# Patient Record
Sex: Male | Born: 1960 | Race: Black or African American | Hispanic: No | Smoking: Light tobacco smoker
Health system: Southern US, Community
[De-identification: ages and names within clinical notes are randomized; demographics above are authoritative.]

## PROBLEM LIST (undated history)

## (undated) DIAGNOSIS — N183 Chronic kidney disease, stage 3 unspecified: Secondary | ICD-10-CM

## (undated) DIAGNOSIS — I10 Essential (primary) hypertension: Secondary | ICD-10-CM

## (undated) DIAGNOSIS — I480 Paroxysmal atrial fibrillation: Secondary | ICD-10-CM

## (undated) DIAGNOSIS — K219 Gastro-esophageal reflux disease without esophagitis: Secondary | ICD-10-CM

## (undated) DIAGNOSIS — M1990 Unspecified osteoarthritis, unspecified site: Secondary | ICD-10-CM

## (undated) DIAGNOSIS — I421 Obstructive hypertrophic cardiomyopathy: Secondary | ICD-10-CM

## (undated) DIAGNOSIS — M26629 Arthralgia of temporomandibular joint, unspecified side: Secondary | ICD-10-CM

## (undated) DIAGNOSIS — I2510 Atherosclerotic heart disease of native coronary artery without angina pectoris: Secondary | ICD-10-CM

## (undated) HISTORY — PX: APPENDECTOMY: SHX54

## (undated) HISTORY — DX: Chronic kidney disease, stage 3 (moderate): N18.3

## (undated) HISTORY — DX: Essential (primary) hypertension: I10

---
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
## 2000-01-07 HISTORY — PX: TOTAL HIP ARTHROPLASTY: SHX124

## 2000-03-30 ENCOUNTER — Inpatient Hospital Stay (HOSPITAL_COMMUNITY): Admission: RE | Admit: 2000-03-30 | Discharge: 2000-04-08 | Payer: Self-pay | Admitting: Orthopaedic Surgery

## 2000-04-04 ENCOUNTER — Encounter (HOSPITAL_BASED_OUTPATIENT_CLINIC_OR_DEPARTMENT_OTHER): Payer: Self-pay | Admitting: Internal Medicine

## 2002-04-30 ENCOUNTER — Ambulatory Visit (HOSPITAL_COMMUNITY): Admission: RE | Admit: 2002-04-30 | Discharge: 2002-04-30 | Payer: Self-pay | Admitting: Orthopaedic Surgery

## 2002-11-05 ENCOUNTER — Ambulatory Visit: Admission: RE | Admit: 2002-11-05 | Discharge: 2002-11-05 | Payer: Self-pay | Admitting: Orthopaedic Surgery

## 2003-01-25 ENCOUNTER — Encounter: Payer: Self-pay | Admitting: Orthopedic Surgery

## 2003-01-29 ENCOUNTER — Encounter: Payer: Self-pay | Admitting: Orthopedic Surgery

## 2003-01-29 ENCOUNTER — Encounter (INDEPENDENT_AMBULATORY_CARE_PROVIDER_SITE_OTHER): Payer: Self-pay | Admitting: Specialist

## 2003-01-29 ENCOUNTER — Inpatient Hospital Stay (HOSPITAL_COMMUNITY): Admission: RE | Admit: 2003-01-29 | Discharge: 2003-02-03 | Payer: Self-pay | Admitting: Orthopedic Surgery

## 2004-03-06 ENCOUNTER — Encounter: Admission: RE | Admit: 2004-03-06 | Discharge: 2004-06-04 | Payer: Self-pay | Admitting: Orthopedic Surgery

## 2004-03-26 ENCOUNTER — Emergency Department (HOSPITAL_COMMUNITY): Admission: EM | Admit: 2004-03-26 | Discharge: 2004-03-26 | Payer: Self-pay | Admitting: Family Medicine

## 2004-07-03 ENCOUNTER — Encounter: Admission: RE | Admit: 2004-07-03 | Discharge: 2004-07-03 | Payer: Self-pay | Admitting: Sports Medicine

## 2004-12-06 ENCOUNTER — Inpatient Hospital Stay (HOSPITAL_COMMUNITY): Admission: EM | Admit: 2004-12-06 | Discharge: 2004-12-07 | Payer: Self-pay | Admitting: Emergency Medicine

## 2004-12-06 ENCOUNTER — Ambulatory Visit: Payer: Self-pay | Admitting: Sports Medicine

## 2004-12-07 ENCOUNTER — Encounter (INDEPENDENT_AMBULATORY_CARE_PROVIDER_SITE_OTHER): Payer: Self-pay | Admitting: *Deleted

## 2005-01-18 ENCOUNTER — Encounter: Admission: RE | Admit: 2005-01-18 | Discharge: 2005-01-18 | Payer: Self-pay | Admitting: Sports Medicine

## 2005-01-18 ENCOUNTER — Ambulatory Visit: Payer: Self-pay | Admitting: Family Medicine

## 2005-01-22 ENCOUNTER — Ambulatory Visit: Payer: Self-pay | Admitting: Family Medicine

## 2005-01-25 ENCOUNTER — Ambulatory Visit: Payer: Self-pay | Admitting: Sports Medicine

## 2005-01-25 ENCOUNTER — Encounter: Admission: RE | Admit: 2005-01-25 | Discharge: 2005-01-25 | Payer: Self-pay | Admitting: Family Medicine

## 2005-02-22 ENCOUNTER — Ambulatory Visit: Payer: Self-pay | Admitting: Family Medicine

## 2005-02-24 ENCOUNTER — Ambulatory Visit: Payer: Self-pay | Admitting: Sports Medicine

## 2005-04-01 ENCOUNTER — Ambulatory Visit (HOSPITAL_COMMUNITY): Admission: RE | Admit: 2005-04-01 | Discharge: 2005-04-01 | Payer: Self-pay | Admitting: *Deleted

## 2005-04-01 ENCOUNTER — Ambulatory Visit: Payer: Self-pay | Admitting: Family Medicine

## 2005-04-09 ENCOUNTER — Ambulatory Visit: Payer: Self-pay | Admitting: Family Medicine

## 2005-04-21 ENCOUNTER — Encounter: Admission: RE | Admit: 2005-04-21 | Discharge: 2005-04-21 | Payer: Self-pay | Admitting: Orthopedic Surgery

## 2005-06-28 ENCOUNTER — Ambulatory Visit: Payer: Self-pay | Admitting: Sports Medicine

## 2005-07-28 ENCOUNTER — Ambulatory Visit: Payer: Self-pay | Admitting: Family Medicine

## 2005-10-22 ENCOUNTER — Emergency Department (HOSPITAL_COMMUNITY): Admission: AD | Admit: 2005-10-22 | Discharge: 2005-10-22 | Payer: Self-pay | Admitting: Family Medicine

## 2005-11-29 ENCOUNTER — Ambulatory Visit: Payer: Self-pay | Admitting: Sports Medicine

## 2005-12-16 ENCOUNTER — Ambulatory Visit: Payer: Self-pay | Admitting: Family Medicine

## 2005-12-30 ENCOUNTER — Ambulatory Visit: Payer: Self-pay | Admitting: Family Medicine

## 2006-01-17 ENCOUNTER — Ambulatory Visit (HOSPITAL_COMMUNITY): Admission: RE | Admit: 2006-01-17 | Discharge: 2006-01-17 | Payer: Self-pay | Admitting: Family Medicine

## 2006-01-17 ENCOUNTER — Ambulatory Visit: Payer: Self-pay | Admitting: Family Medicine

## 2007-01-05 DIAGNOSIS — N19 Unspecified kidney failure: Secondary | ICD-10-CM

## 2007-01-05 HISTORY — DX: Unspecified kidney failure: N19

## 2007-02-28 ENCOUNTER — Telehealth: Payer: Self-pay | Admitting: *Deleted

## 2007-03-01 ENCOUNTER — Ambulatory Visit: Payer: Self-pay | Admitting: Sports Medicine

## 2007-03-15 ENCOUNTER — Telehealth: Payer: Self-pay | Admitting: *Deleted

## 2007-03-16 ENCOUNTER — Telehealth: Payer: Self-pay | Admitting: *Deleted

## 2007-03-23 ENCOUNTER — Emergency Department (HOSPITAL_COMMUNITY): Admission: EM | Admit: 2007-03-23 | Discharge: 2007-03-23 | Payer: Self-pay | Admitting: Family Medicine

## 2007-03-31 ENCOUNTER — Ambulatory Visit: Payer: Self-pay | Admitting: Family Medicine

## 2007-03-31 ENCOUNTER — Encounter (INDEPENDENT_AMBULATORY_CARE_PROVIDER_SITE_OTHER): Payer: Self-pay | Admitting: *Deleted

## 2007-03-31 DIAGNOSIS — M25519 Pain in unspecified shoulder: Secondary | ICD-10-CM | POA: Insufficient documentation

## 2007-03-31 DIAGNOSIS — J309 Allergic rhinitis, unspecified: Secondary | ICD-10-CM | POA: Insufficient documentation

## 2007-03-31 DIAGNOSIS — F17200 Nicotine dependence, unspecified, uncomplicated: Secondary | ICD-10-CM

## 2007-03-31 LAB — CONVERTED CEMR LAB
AST: 20 units/L (ref 0–37)
Albumin: 4.6 g/dL (ref 3.5–5.2)
BUN: 22 mg/dL (ref 6–23)
Glucose, Bld: 108 mg/dL — ABNORMAL HIGH (ref 70–99)
MCV: 79.2 fL
Platelets: 273 10*3/uL
Potassium: 4 meq/L (ref 3.5–5.3)
RBC: 4.48 M/uL

## 2007-04-06 ENCOUNTER — Telehealth: Payer: Self-pay | Admitting: *Deleted

## 2007-05-02 ENCOUNTER — Telehealth: Payer: Self-pay | Admitting: *Deleted

## 2007-05-05 ENCOUNTER — Ambulatory Visit: Payer: Self-pay | Admitting: Family Medicine

## 2007-05-05 DIAGNOSIS — E785 Hyperlipidemia, unspecified: Secondary | ICD-10-CM

## 2007-05-17 ENCOUNTER — Encounter: Admission: RE | Admit: 2007-05-17 | Discharge: 2007-08-15 | Payer: Self-pay | Admitting: *Deleted

## 2007-05-23 ENCOUNTER — Encounter (INDEPENDENT_AMBULATORY_CARE_PROVIDER_SITE_OTHER): Payer: Self-pay | Admitting: *Deleted

## 2007-06-02 ENCOUNTER — Ambulatory Visit: Payer: Self-pay | Admitting: Family Medicine

## 2007-06-13 ENCOUNTER — Telehealth: Payer: Self-pay | Admitting: *Deleted

## 2007-06-22 ENCOUNTER — Telehealth (INDEPENDENT_AMBULATORY_CARE_PROVIDER_SITE_OTHER): Payer: Self-pay | Admitting: *Deleted

## 2007-06-29 ENCOUNTER — Encounter (INDEPENDENT_AMBULATORY_CARE_PROVIDER_SITE_OTHER): Payer: Self-pay | Admitting: *Deleted

## 2007-07-07 ENCOUNTER — Ambulatory Visit: Payer: Self-pay | Admitting: Family Medicine

## 2007-07-07 DIAGNOSIS — M25559 Pain in unspecified hip: Secondary | ICD-10-CM

## 2007-07-25 ENCOUNTER — Telehealth (INDEPENDENT_AMBULATORY_CARE_PROVIDER_SITE_OTHER): Payer: Self-pay | Admitting: *Deleted

## 2007-08-24 ENCOUNTER — Telehealth (INDEPENDENT_AMBULATORY_CARE_PROVIDER_SITE_OTHER): Payer: Self-pay | Admitting: *Deleted

## 2007-09-04 ENCOUNTER — Ambulatory Visit: Payer: Self-pay | Admitting: Sports Medicine

## 2007-09-18 ENCOUNTER — Ambulatory Visit: Payer: Self-pay | Admitting: Sports Medicine

## 2007-09-25 ENCOUNTER — Encounter (INDEPENDENT_AMBULATORY_CARE_PROVIDER_SITE_OTHER): Payer: Self-pay | Admitting: *Deleted

## 2007-09-26 ENCOUNTER — Encounter: Admission: RE | Admit: 2007-09-26 | Discharge: 2007-09-26 | Payer: Self-pay | Admitting: Sports Medicine

## 2007-10-09 ENCOUNTER — Telehealth (INDEPENDENT_AMBULATORY_CARE_PROVIDER_SITE_OTHER): Payer: Self-pay | Admitting: *Deleted

## 2007-10-11 ENCOUNTER — Ambulatory Visit: Payer: Self-pay | Admitting: Family Medicine

## 2007-10-11 DIAGNOSIS — M549 Dorsalgia, unspecified: Secondary | ICD-10-CM | POA: Insufficient documentation

## 2007-10-11 HISTORY — DX: Dorsalgia, unspecified: M54.9

## 2007-10-19 ENCOUNTER — Telehealth: Payer: Self-pay | Admitting: *Deleted

## 2007-10-19 ENCOUNTER — Ambulatory Visit: Payer: Self-pay | Admitting: Family Medicine

## 2007-10-30 ENCOUNTER — Telehealth (INDEPENDENT_AMBULATORY_CARE_PROVIDER_SITE_OTHER): Payer: Self-pay | Admitting: *Deleted

## 2007-11-20 ENCOUNTER — Ambulatory Visit: Payer: Self-pay | Admitting: Sports Medicine

## 2007-11-22 ENCOUNTER — Encounter (INDEPENDENT_AMBULATORY_CARE_PROVIDER_SITE_OTHER): Payer: Self-pay | Admitting: *Deleted

## 2007-11-22 ENCOUNTER — Ambulatory Visit: Payer: Self-pay | Admitting: Family Medicine

## 2007-11-22 LAB — CONVERTED CEMR LAB
ALT: 15 units/L (ref 0–53)
Albumin: 4.6 g/dL (ref 3.5–5.2)
Alkaline Phosphatase: 66 units/L (ref 39–117)
BUN: 15 mg/dL (ref 6–23)
Calcium: 9.5 mg/dL (ref 8.4–10.5)
Chloride: 104 meq/L (ref 96–112)
Glucose, Bld: 82 mg/dL (ref 70–99)
Potassium: 4.2 meq/L (ref 3.5–5.3)
Sodium: 141 meq/L (ref 135–145)
Total Protein: 7.3 g/dL (ref 6.0–8.3)

## 2007-11-27 ENCOUNTER — Encounter (INDEPENDENT_AMBULATORY_CARE_PROVIDER_SITE_OTHER): Payer: Self-pay | Admitting: *Deleted

## 2007-12-28 ENCOUNTER — Telehealth (INDEPENDENT_AMBULATORY_CARE_PROVIDER_SITE_OTHER): Payer: Self-pay | Admitting: *Deleted

## 2008-01-16 ENCOUNTER — Telehealth: Payer: Self-pay | Admitting: *Deleted

## 2008-01-18 ENCOUNTER — Ambulatory Visit: Payer: Self-pay | Admitting: Sports Medicine

## 2008-01-19 ENCOUNTER — Ambulatory Visit: Payer: Self-pay | Admitting: Family Medicine

## 2008-01-24 ENCOUNTER — Telehealth (INDEPENDENT_AMBULATORY_CARE_PROVIDER_SITE_OTHER): Payer: Self-pay | Admitting: *Deleted

## 2008-01-31 ENCOUNTER — Ambulatory Visit: Payer: Self-pay | Admitting: Family Medicine

## 2008-02-07 ENCOUNTER — Telehealth (INDEPENDENT_AMBULATORY_CARE_PROVIDER_SITE_OTHER): Payer: Self-pay | Admitting: *Deleted

## 2008-02-17 ENCOUNTER — Ambulatory Visit: Payer: Self-pay | Admitting: Internal Medicine

## 2008-02-17 ENCOUNTER — Observation Stay (HOSPITAL_COMMUNITY): Admission: EM | Admit: 2008-02-17 | Discharge: 2008-02-18 | Payer: Self-pay | Admitting: Emergency Medicine

## 2008-02-17 ENCOUNTER — Encounter: Payer: Self-pay | Admitting: Family Medicine

## 2008-02-17 DIAGNOSIS — I480 Paroxysmal atrial fibrillation: Secondary | ICD-10-CM

## 2008-02-21 ENCOUNTER — Ambulatory Visit: Payer: Self-pay | Admitting: Family Medicine

## 2008-02-21 ENCOUNTER — Ambulatory Visit (HOSPITAL_COMMUNITY): Admission: RE | Admit: 2008-02-21 | Discharge: 2008-02-21 | Payer: Self-pay | Admitting: Family Medicine

## 2008-02-21 ENCOUNTER — Encounter (INDEPENDENT_AMBULATORY_CARE_PROVIDER_SITE_OTHER): Payer: Self-pay | Admitting: *Deleted

## 2008-02-21 DIAGNOSIS — K219 Gastro-esophageal reflux disease without esophagitis: Secondary | ICD-10-CM | POA: Insufficient documentation

## 2008-02-21 LAB — CONVERTED CEMR LAB
ALT: 14 units/L (ref 0–53)
Free T4: 1.48 ng/dL (ref 0.89–1.80)
Glucose, Bld: 89 mg/dL (ref 70–99)
Sodium: 142 meq/L (ref 135–145)
TSH: 1.126 microintl units/mL (ref 0.350–5.50)
Total Bilirubin: 1.1 mg/dL (ref 0.3–1.2)
Total CHOL/HDL Ratio: 3.9
Total Protein: 6.9 g/dL (ref 6.0–8.3)

## 2008-02-26 ENCOUNTER — Telehealth: Payer: Self-pay | Admitting: *Deleted

## 2008-02-26 ENCOUNTER — Encounter (INDEPENDENT_AMBULATORY_CARE_PROVIDER_SITE_OTHER): Payer: Self-pay | Admitting: *Deleted

## 2008-03-07 ENCOUNTER — Encounter: Payer: Self-pay | Admitting: Family Medicine

## 2008-03-19 ENCOUNTER — Telehealth: Payer: Self-pay | Admitting: *Deleted

## 2008-04-12 ENCOUNTER — Telehealth (INDEPENDENT_AMBULATORY_CARE_PROVIDER_SITE_OTHER): Payer: Self-pay | Admitting: *Deleted

## 2008-04-23 ENCOUNTER — Ambulatory Visit: Payer: Self-pay | Admitting: Family Medicine

## 2008-04-23 ENCOUNTER — Encounter (INDEPENDENT_AMBULATORY_CARE_PROVIDER_SITE_OTHER): Payer: Self-pay | Admitting: *Deleted

## 2008-04-23 DIAGNOSIS — F528 Other sexual dysfunction not due to a substance or known physiological condition: Secondary | ICD-10-CM

## 2008-04-23 DIAGNOSIS — B351 Tinea unguium: Secondary | ICD-10-CM

## 2008-04-24 ENCOUNTER — Encounter (INDEPENDENT_AMBULATORY_CARE_PROVIDER_SITE_OTHER): Payer: Self-pay | Admitting: *Deleted

## 2008-05-17 ENCOUNTER — Telehealth (INDEPENDENT_AMBULATORY_CARE_PROVIDER_SITE_OTHER): Payer: Self-pay | Admitting: Family Medicine

## 2008-05-22 ENCOUNTER — Ambulatory Visit: Payer: Self-pay | Admitting: Family Medicine

## 2008-05-28 ENCOUNTER — Telehealth: Payer: Self-pay | Admitting: *Deleted

## 2008-06-11 ENCOUNTER — Telehealth (INDEPENDENT_AMBULATORY_CARE_PROVIDER_SITE_OTHER): Payer: Self-pay | Admitting: Family Medicine

## 2008-06-24 ENCOUNTER — Telehealth: Payer: Self-pay | Admitting: *Deleted

## 2008-07-03 ENCOUNTER — Telehealth: Payer: Self-pay | Admitting: *Deleted

## 2008-07-08 ENCOUNTER — Encounter (INDEPENDENT_AMBULATORY_CARE_PROVIDER_SITE_OTHER): Payer: Self-pay | Admitting: Family Medicine

## 2008-07-08 ENCOUNTER — Ambulatory Visit: Payer: Self-pay | Admitting: Family Medicine

## 2008-07-08 LAB — CONVERTED CEMR LAB

## 2008-07-09 LAB — CONVERTED CEMR LAB
BUN: 19 mg/dL (ref 6–23)
CO2: 22 meq/L (ref 19–32)
Chloride: 104 meq/L (ref 96–112)
Creatinine, Ser: 1.68 mg/dL — ABNORMAL HIGH (ref 0.40–1.50)
Potassium: 4.3 meq/L (ref 3.5–5.3)

## 2008-07-10 ENCOUNTER — Telehealth: Payer: Self-pay | Admitting: *Deleted

## 2008-07-11 ENCOUNTER — Ambulatory Visit (HOSPITAL_COMMUNITY): Admission: RE | Admit: 2008-07-11 | Discharge: 2008-07-11 | Payer: Self-pay | Admitting: Family Medicine

## 2008-07-11 ENCOUNTER — Encounter (INDEPENDENT_AMBULATORY_CARE_PROVIDER_SITE_OTHER): Payer: Self-pay | Admitting: Internal Medicine

## 2008-07-18 ENCOUNTER — Encounter: Payer: Self-pay | Admitting: *Deleted

## 2008-07-24 ENCOUNTER — Encounter: Payer: Self-pay | Admitting: *Deleted

## 2008-08-07 ENCOUNTER — Ambulatory Visit: Payer: Self-pay | Admitting: Family Medicine

## 2008-08-07 DIAGNOSIS — I421 Obstructive hypertrophic cardiomyopathy: Secondary | ICD-10-CM | POA: Insufficient documentation

## 2008-08-07 HISTORY — DX: Obstructive hypertrophic cardiomyopathy: I42.1

## 2008-09-05 ENCOUNTER — Telehealth: Payer: Self-pay | Admitting: *Deleted

## 2008-10-01 ENCOUNTER — Telehealth: Payer: Self-pay | Admitting: *Deleted

## 2008-11-04 ENCOUNTER — Telehealth (INDEPENDENT_AMBULATORY_CARE_PROVIDER_SITE_OTHER): Payer: Self-pay | Admitting: Family Medicine

## 2008-11-05 ENCOUNTER — Ambulatory Visit: Payer: Self-pay | Admitting: Family Medicine

## 2008-11-12 ENCOUNTER — Ambulatory Visit: Payer: Self-pay | Admitting: Sports Medicine

## 2008-11-12 DIAGNOSIS — M719 Bursopathy, unspecified: Secondary | ICD-10-CM | POA: Insufficient documentation

## 2008-11-12 DIAGNOSIS — M67919 Unspecified disorder of synovium and tendon, unspecified shoulder: Secondary | ICD-10-CM | POA: Insufficient documentation

## 2008-12-02 ENCOUNTER — Telehealth: Payer: Self-pay | Admitting: *Deleted

## 2008-12-03 ENCOUNTER — Telehealth (INDEPENDENT_AMBULATORY_CARE_PROVIDER_SITE_OTHER): Payer: Self-pay | Admitting: Family Medicine

## 2008-12-03 DIAGNOSIS — F1910 Other psychoactive substance abuse, uncomplicated: Secondary | ICD-10-CM | POA: Insufficient documentation

## 2008-12-05 ENCOUNTER — Encounter: Payer: Self-pay | Admitting: *Deleted

## 2008-12-18 ENCOUNTER — Ambulatory Visit: Payer: Self-pay | Admitting: Family Medicine

## 2009-02-11 ENCOUNTER — Ambulatory Visit: Payer: Self-pay | Admitting: Sports Medicine

## 2009-05-26 ENCOUNTER — Telehealth: Payer: Self-pay | Admitting: Family Medicine

## 2009-05-27 ENCOUNTER — Ambulatory Visit: Payer: Self-pay | Admitting: Family Medicine

## 2009-05-27 ENCOUNTER — Encounter: Payer: Self-pay | Admitting: Family Medicine

## 2009-06-24 ENCOUNTER — Telehealth: Payer: Self-pay | Admitting: *Deleted

## 2010-01-15 ENCOUNTER — Ambulatory Visit (HOSPITAL_COMMUNITY): Admission: RE | Admit: 2010-01-15 | Discharge: 2010-01-15 | Payer: Self-pay | Admitting: Family Medicine

## 2010-01-15 ENCOUNTER — Ambulatory Visit: Payer: Self-pay | Admitting: Sports Medicine

## 2010-01-15 ENCOUNTER — Encounter: Payer: Self-pay | Admitting: Family Medicine

## 2010-04-30 ENCOUNTER — Encounter: Payer: Self-pay | Admitting: Family Medicine

## 2010-04-30 ENCOUNTER — Ambulatory Visit: Payer: Self-pay | Admitting: Sports Medicine

## 2010-07-20 ENCOUNTER — Ambulatory Visit: Payer: Self-pay | Admitting: Sports Medicine

## 2010-07-20 DIAGNOSIS — M25569 Pain in unspecified knee: Secondary | ICD-10-CM

## 2010-08-11 ENCOUNTER — Ambulatory Visit: Payer: Self-pay | Admitting: Sports Medicine

## 2010-08-11 DIAGNOSIS — R269 Unspecified abnormalities of gait and mobility: Secondary | ICD-10-CM

## 2010-08-25 ENCOUNTER — Telehealth (INDEPENDENT_AMBULATORY_CARE_PROVIDER_SITE_OTHER): Payer: Self-pay | Admitting: *Deleted

## 2010-09-07 ENCOUNTER — Telehealth: Payer: Self-pay | Admitting: Family Medicine

## 2010-09-10 ENCOUNTER — Telehealth: Payer: Self-pay | Admitting: Family Medicine

## 2010-12-08 NOTE — Assessment & Plan Note (Signed)
Summary: HIP PAIN,MC   Vital Signs:  Patient profile:   50 year old male Height:      71 inches Weight:      205 pounds BP sitting:   149 / 87  Vitals Entered By: April Manson CMA (January 15, 2010 3:10 PM)  Primary Provider:  Clifton Custard MD   History of Present Illness: Pt presents for right shoulder pain, left shoulder weakness and bilateral hip pain. 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 He describes having had multiple surgeries on his hips which per his report have not been very successful. He is currently on permanent disability but is hoping to do some vocational rehabilitation. He is only taking Tylenol for his pain since he has a history of narcotic abuse and renal disase due to NSAID usage in the past. He is here because of worsening right thigh pain when walking and doing work on the stationary bike. He has not been able to return to his orthopedic surgeons (both in [place] and at [place]) because of unpaid medical bills   The patient also has a history of left rotator cuff tendinitis that was treated with steroid injections that were helpful. He now presents with right shoulder pain ever since he did some house work last Tuesday. He does not recall an actual injury or popping sensation in his shoulder. He does have decreased ROM and pain with sleeping on his right shoulder. He is right hand dominant.    He has also brought paperwork in for a handicapp sticker to put in his vehicle.   Allergies: 1)  ! Nsaids  Physical Exam  General:  alert, well-developed, and well-nourished.   Head:  normocephalic and atraumatic.   Neck:  supple.   Chest Wall:  no deformities.   Lungs:  normal respiratory effort.   Msk:  Right Shoulder: Normal inspection Forward flexion and abduction to 170 degress but slow and painful + drop arm 5/5 strength with resisted internal and  external rotation + Neer's, + Hawkin's, + cross over Normal Speed's test Mild TTP over the biceps tendon No AC joint pain to palpation 5/5 grip strength Able to put his hand up to his belt loop behind his back  Left Shoulder: Normal inspection Forward flexion and abduction to 170 degress but slow and painful + drop arm 5/5 strength with resisted internal and external rotation - Neer's, - Hawkin's, - cross over Normal Speed's test Mild TTP over the biceps tendon No TTP throughout 5/5 grip strength Able to put his hand up to L1  Bilateral Hips Walks with an antalgic gait and shuffles + TTP in bilateral groins and along greater trochanters Internal rotation of 10 degrees bilaterally External rotation of 20 degrees  4/5 strength with resisted bilateral hip flexion, abduction and adduction    Impression & Recommendations:  Problem # 1:  HIP PAIN, CHRONIC (ICD-719.45) Since he has had multiple surgeries, will get an x-ray of his bilateral hips with AP view and lateral view to assess the placement and stability of his prostheses Continue Tylenol as needed for pain. No NSAIDS because of renal disease. No narcotics because of history of abuse.  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 Consented patient for steroid injection into his right subacromial space for pain relief. Patient verbally consented and was aware of the  risks and benefits including infection, bleeding and tendon rupture.  Consent obtained and verified. Sterile betadine prep. Furthur cleansed with alcohol. Topical analgesic spray: Ethyl chloride. Joint: Right shoulder Approached in typical fashion with: subacromial approach Completed without difficulty Meds:40 mg of kenalog, 6 cc of 0.5 % Marcaine Needle: 27 guage Aftercare instructions and Red flags advised. Pt tolerated the procedure well  2. Given Codman exercises to do at first as well as Theraband and  rotator cuff exercises to do in 5 days. Do exercises with bilateral shoulders daily 3. Return in 4 weeks for follow-up  Complete Medication List: 1)  Quinapril Hcl 10 Mg Tabs (Quinapril hcl) .... One by mouth daily 2)  Zyrtec Allergy 10 Mg Tabs (Cetirizine hcl) .Marland Kitchen.. 1 once daily prn 3)  Flonase 50 Mcg/act Susp (Fluticasone propionate) .... Spray 1 spray into both nostrils twice a day 4)  Viagra 100 Mg Tabs (Sildenafil citrate) .... 1/2 to 1 tablet by mouth prn 5)  Ranitidine Hcl 300 Mg Tabs (Ranitidine hcl) .... Take one pill by mouth each morning

## 2010-12-08 NOTE — Progress Notes (Signed)
  Phone Note Refill Request Call back at [phone]   Refills Requested: Medication #1:  ZYRTEC ALLERGY 10 MG  TABS 1 once daily prn Pt need refill called in.  Initial call taken by: Eusebio Friendly,  September 07, 2010 3:27 PM    Prescriptions: ZYRTEC ALLERGY 10 MG  TABS (CETIRIZINE HCL) 1 once daily prn  #30 x 11   Entered and Authorized by:   Mariana Arn  MD   Signed by:   Mariana Arn  MD on 09/07/2010   Method used:   Electronically to        [facility] (retail)       [address]       [place], [place]  [address]       Ph: [phone]       Fax: [phone]   RxID:   [id number]

## 2010-12-08 NOTE — Assessment & Plan Note (Signed)
Summary: ORTHOTICS PER FIELDS,MC   Vital Signs:  Patient profile:   50 year old male Pulse rate:   68 / minute BP sitting:   151 / 84  (left arm)  Vitals Entered By: Caesar Chestnut RN (August 11, 2010 2:41 PM) CC: orthotics   Primary Provider:  Clifton Custard MD  CC:  orthotics.  History of Present Illness: 50 yo male to office for f/u of hip pain & possible orthotics.  He has extensive PMH significant for SCFE with Rt THR with multiple revisions (last surgery 2003).  Also with hx of pinning of Lt femoral head.  He has chronic pain in both hips, but has been worsening over the past year.  Symptoms currently most bothersome on the right.  Walks with a limp & has pain when walking.  Taking Mobic as needed with some improvement.  Hip injection in 2004 which helped.  Denies any numbness/tingling, denies any specific weakness.  Is not doing any hip exercises.  Allergies: 1)  ! 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  On disability for hx of SCFE.  Is substitute math teacher  Physical Exam  General:  Well-developed,well-nourished,in no acute distress; alert,appropriate and cooperative throughout examination Msk:   HIPS:  no TTP along greater troch or groin today.  globally restricted ROM bilaterally with significantly decreased IR bilaterally L>R.  Global hip weakness also noted bilaterally.  FEET: bilateral pes planus, no significant callous formation.    GAIT:  No leg length difference.  Ttrendelenburg gait with shift to the right.  Also noted to have external rotation of right hip & foot.  Moderate pronation bilaterally R>L Pulses:  +2/4 DP & PT b/l Neurologic:  sensation intact to light touch.     Impression & Recommendations:  Problem # 1:  HIP PAIN, CHRONIC (ICD-719.45)  - Chronic hip pain - R>L presently - Noticeable hip weakness & gait abnormality which is likely contributing to his hip pain.  Fitted with sports insoles in office today as noted below. - Given handout for hip exercises to help with ROM & strengthening.   - Encouraged to use stationary bike. - Can continue mobic as prescribed. - May need to re-establish care with orthopedist in the future given his extensive hx of surgeries. - f/u as previously recommended in 2-3 weeks.  His updated medication list for this problem includes:    Mobic 15 Mg Tabs (Meloxicam) .Marland Kitchen... Take 1 tab by mouth daily as needed pain.  Orders: Foot Insert (L3080) Sports Insoles [phone])  Problem # 2:  ABNORMALITY OF GAIT (ICD-781.2)  - Trendelenburg gait with shift to right, prominent ext rotation of right hip/leg, bilateral pronation - contributing to chronic hip pain -  Fitted with sports insoles with scaphoid pads & 1st ray post bilaterally.  Reassessment of gait with insoles showed some persistent trendelnburg, but with less right hip/leg ext rotation & less pronation of the feet.  Insoles felt comfortable to patient. - May benefit from custom orthotics in the future.  Orders: Foot Insert (J2426) Sports Insoles [phone])  Complete Medication List: 1)  Quinapril Hcl 10 Mg Tabs (Quinapril hcl) .... One by mouth daily 2)  Zyrtec Allergy 10 Mg Tabs  (Cetirizine hcl) .Marland Kitchen.. 1 once daily prn 3)  Flonase 50 Mcg/act Susp (Fluticasone propionate) .... Spray 1 spray into both nostrils twice a day 4)  Viagra 100 Mg Tabs (Sildenafil citrate) .... 1/2 to 1 tablet by mouth prn 5)  Ranitidine Hcl 300 Mg Tabs (Ranitidine hcl) .... Take one pill by mouth each morning 6)  Mobic 15 Mg Tabs (Meloxicam) .... Take 1 tab by mouth daily as needed pain.

## 2010-12-08 NOTE — Letter (Signed)
Summary: [place] [facility]  [place] [facility]   Imported By: Tobin Chad 01/19/2010 10:45:17  _____________________________________________________________________  External Attachment:    Type:   Image     Comment:   External Document

## 2010-12-08 NOTE — Assessment & Plan Note (Signed)
Summary: SHOULDER INJECTION,MC   Vital Signs:  Patient profile:   50 year old male BP sitting:   154 / 98  Vitals Entered By: April Manson CMA (April 30, 2010 2:13 PM)  Primary Provider:  Clifton Custard MD   History of Present Illness: Pt presents for follow-up of left shoulder pain and bilateral hip pain. He continues to have difficulty with pain along his left shoulder when he does yard work. He, per his report, had an MRI previously which showed a rotator cuff tear. However, he rehabbed his shoulder very well and is able to do overhead activities. His left shoulder still aches at night. The previous steroid injections have helped him with his left shoulder pain. He is here requesting another injection.  He also has a history of SCFE as a child and required a right total hip replacement with revision as well as pinning of his left hip. He has been in chronic pain but has been able to continue most of his activities. he has more right thigh pain though in the middle of his thigh. He has not seen an orthopedic surgeon for this in a while. He had his last surgery performed by Dr. Hector Shade per his report.   Allergies: 1)  ! 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 Maureen Ralphs hypertrophic cardiomyopathy with Afib  Past Surgical History: Last updated: 02/17/2008 Cystoscopy - wnl - 02/22/2005, ECHO - EF 55-65, WNL - 11/08/2004,  Injected R shoulder - 8/05, 2/07 - 12/16/2005,  S/P B hip replacements x 2 `01 and `05 - 01/18/2005 s/p pinning or R and L hip as child - 01/18/2005  Family History: Last updated: 08/23/2008 Brother died recently from complication of DM DM - dad, mom and brother father with prostate cancer no sudden cardiac death or CAD <ae 24   son (Khadir) w/ scfe dx'd age  20  Social History: Last updated: 12/18/2008 smokes 1/4  pack q day but has smoked heavier in past; 2-3 drinks dark liquor per day, denies drugs, but 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  On disability for hx of SCFE.  Is substitute math teacher  Risk Factors: Smoking Status: current (05/27/2009) Packs/Day: 0.5 (05/27/2009)  Physical Exam  General:  alert and well-developed.   Head:  normocephalic and atraumatic.   Ears:  Normal hearing Mouth:  MMM Neck:  supple and full ROM.   Lungs:  normal respiratory effort.   Msk:  Left Shoulder Normal inspection ROM has difficulty at 90 degrees with abduction and recruits deltoid and other muscles. Lacks 20 degrees actively with FF and abduction 4/5 strength with resisted IR and ER of his shoulder + Empty can test, Hawkin's, Cross over, Speed's and Neer's No TTP over [facility] joint Neurovascularly intact  Right Shoulder: Normal inspection Again has difficulty at 90 degrees with forward flexion and abduction and recruits other muscles. Lacks 30 degrees of active FF and abduction 4/5 strength with resisted IR and ER of his shoulder + Empty can test (mild),  Speed's and Neer's Neg Cross over and Hawkin's No TTP over [facility] joint Neurovascularly intact  Hips: Only has about 10-15 degrees of IR rotation on the left and ER of 15-20 degrees Right hip IR of 15-20 degrees  and ER of 20-25 degrees Walks with a limp   Impression & Recommendations:  Problem # 1:  ROTATOR CUFF SYNDROME, LEFT (ICD-726.10) Assessment Deteriorated  1. Discussed having him seen an orthopedic surgeon simply for evaluation but it has been over a year since he tore his cuff per his report - can consider this in the future but likely too late for surgery 2. Consented for an injection today but also informed him that these may cause more osteoarthritis in the future Consent obtained  and verified. Sterile betadine prep. Furthur cleansed with alcohol. Topical analgesic spray: Ethyl chloride. Joint: LEFT shoulder Approached in typical fashion with: Completed without difficulty Meds:40mg  of Kenalog and 7 cc of 1% Lidocaine Needle:22 gauge Aftercare instructions and Red flags advised. No heavy lifting for 3 days.  3. Can return in 3 months as needed for future injections  Orders: Joint Aspirate / Injection, Large (20610) Kenalog 10mg  (4units) (J3301)  Problem # 2:  HIP PAIN, CHRONIC (ICD-719.45) Assessment: Unchanged Showed him his x-rays 1. Encouraged him to call Dr. Maureen Ralphs to see if he can follow-up with him to check the stability of his prostheses  Complete Medication List: 1)  Quinapril Hcl 10 Mg Tabs (Quinapril hcl) .... One by mouth daily 2)  Zyrtec Allergy 10 Mg Tabs (Cetirizine hcl) .Marland Kitchen.. 1 once daily prn 3)  Flonase 50 Mcg/act Susp (Fluticasone propionate) .... Spray 1 spray into both nostrils twice a day 4)  Viagra 100 Mg Tabs (Sildenafil citrate) .... 1/2 to 1 tablet by mouth prn 5)  Ranitidine Hcl 300 Mg Tabs (Ranitidine hcl) .... Take one pill by mouth each morning

## 2010-12-08 NOTE — Letter (Signed)
Summary: [facility]  [facility]   Imported By: Tobin Chad 04/30/2010 15:42:41  _____________________________________________________________________  External Attachment:    Type:   Image     Comment:   External Document

## 2010-12-08 NOTE — Progress Notes (Signed)
----   Converted from flag ---- ---- 08/25/2010 2:09 PM, Paul [name] wrote: Pt requested a refill for meloxicam, he just had it refilled but states he spilled water on the bottle of his pills.  Call back # is [phone] ------------------------------  Spoke with patient and advised that he has additional refills already on his meloxicam, and he is requesting the refill at the pharmacy too soon which is why they are charging full price for the rx.  Per Dr. [name] advised pt that he can take 2 aleve twice daily and this would be simlar potency to the meloxicam.

## 2010-12-08 NOTE — Assessment & Plan Note (Signed)
Summary: INJECTION HIP,MC   Vital Signs:  Patient profile:   50 year old male Pulse rate:   63 / minute BP sitting:   161 / 86  (right arm)  Vitals Entered By: Hedy Camara (July 20, 2010 9:53 AM) CC: Left rotator tear, right knee pain & hip pain   Primary Provider:  Clifton Custard MD  CC:  Left rotator tear and right knee pain & hip pain.  History of Present Illness:  Paul [name] presents to discuss the following: - Left Shoulder Pain with h/o RTC tear. Last seen in June - had an injection which helped.  Pain worse with overhead motion.  Also feels some weakness.  Uses tylenol as needed with no sig improvement.  Does use advil occasaionally which is well tolerated and improves his pain.  - Right Hip Pain. s/p total hip replacement with revision (last 2003) for SCFE as a teenager.  Pain has been chronic and gradually worsening over the last year.  Has had steroid injections into hip  ~2004 which helped. Would like to discuss other treatment options. Denies parasthesias or new weakness of extremity.   - Chronic, intermittent, right Knee Pain that worsened over the last 2 weeks.  Pain primarily over patellar tendon but also occurs diffusely. Changed shoes and wonders if this contributed to the pain.  Worse with walking/activity.  Has used advil which helps. Denies injury, edema, clicking, poping, or catching.   Allergies: 1)  ! Nsaids  Physical Exam  General:  Well-developed,well-nourished,in no acute distress; alert,appropriate and cooperative throughout examination Msk:  Lt Shoulder - +arc and drop arm - +empty can, hawkins, speeds - 4/5 strength ext rotation with pain  Left Knee - mild TTP patellar tendon; equivocal pat grind; no effusion or laxity. McMurray deffered 2/2 him pain.  Left Hip/Leg - Restricted ROM globally with mild pain. - Left leg 98cm vs right 97cm  Gait: - Antalgic note with hip ext rotation and some weakness he also shows a significant trendelenburg  with shift to RT - Moderate pronation bilat with eversion of feet R>L   Impression & Recommendations:  Problem # 1:  ROTATOR CUFF SYNDROME, LEFT (ICD-726.10)  Post Subacromial Injection: discussed risk, benefits, and alternatives. Pt verbally consented, landmarks identified, area prepped with alcohol and cold spray, needled inserted and aspirated, and solution of kenalog and lidocaine injected into the subacromial space. There were no complications, min bleeding, and pt had noticeable pain improvement.  Will also do trial of meloxicam; of note nsaids listed as an allergy however pt denies previous intolerance and doing well with advil.  Follow up in 2 weeks for U/S eval of RTC.   Orders: Joint Aspirate / Injection, Large (20610) Kenalog 10 mg inj (U2725)   cleanse with alcohol Topical analgesic spray : Ethyl choride Joint Lt shoulder Approached in typical fashion with: post window toward coracoid Completed without difficulty Meds:kenalog 40 + 6 lidocaine Needle:25 G and 1.5 in Aftercare instructions and Red flags advised completed by Dr Doy Hutching under observation  Problem # 2:  HIP PAIN, CHRONIC (ICD-719.45) Pt may benefit from orthotics; will return in 2-4 weeks for further evaluation.  Also discussed possibility that he may need to reestablish with his surgeon in the future.  Problem # 3:  KNEE PAIN, RIGHT (ICD-719.46) Knee pain likely 2/2 to gait disturbance from hip/pelvis.  Plan for orthotics f/u per above.   Meloxicam as noted.     His updated medication list for this problem includes:  Mobic 15 Mg Tabs (Meloxicam) .Marland Kitchen... Take 1 tab by mouth daily as needed pain.  Complete Medication List: 1)  Quinapril Hcl 10 Mg Tabs (Quinapril hcl) .... One by mouth daily 2)  Zyrtec Allergy 10 Mg Tabs (Cetirizine hcl) .Marland Kitchen.. 1 once daily prn 3)  Flonase 50 Mcg/act Susp (Fluticasone propionate) .... Spray 1 spray into both nostrils twice a day 4)  Viagra 100 Mg Tabs (Sildenafil citrate)  .... 1/2 to 1 tablet by mouth prn 5)  Ranitidine Hcl 300 Mg Tabs (Ranitidine hcl) .... Take one pill by mouth each morning 6)  Mobic 15 Mg Tabs (Meloxicam) .... Take 1 tab by mouth daily as needed pain. Prescriptions: MOBIC 15 MG TABS (MELOXICAM) Take 1 TAB by mouth daily as needed pain.  #30 x 3   Entered and Authorized by:   Paul Libel MD   Signed by:   Paul Libel MD on 07/20/2010   Method used:   Electronically to        [id number] (retail)       [place], [place]  [address]       Ph: [phone]       Fax: [phone]   RxID:   [id number]

## 2010-12-08 NOTE — Progress Notes (Signed)
Summary: meds prob  Phone Note Refill Request Call back at Home Phone [phone] Message from:  Patient  Refills Requested: Medication #1:  ZYRTEC ALLERGY 10 MG  TABS 1 once daily prn pill is not covered under insurance plan but liquid is   Initial call taken by: Audie Clear,  September 10, 2010 2:13 PM    New/Updated Medications: ZYRTEC CHILDRENS ALLERGY 5 MG/5ML SYRP (CETIRIZINE HCL) 10 mg by mouth qday. Prescriptions: ZYRTEC CHILDRENS ALLERGY 5 MG/5ML SYRP (CETIRIZINE HCL) 10 mg by mouth qday.  #1 x 6   Entered and Authorized by:   Mariana Arn  MD   Signed by:   Mariana Arn  MD on 09/21/2010   Method used:   Electronically to        [facility] (retail)       [address]       [place], [place]  [address]       Ph: [phone]       Fax: [phone]   RxID:   [id number]  Please let know that script is at pharmacy  for pickup. Thanks! Tenna Child  MD  September 21, 2010 1:20 PM

## 2011-01-20 ENCOUNTER — Encounter: Payer: Self-pay | Admitting: *Deleted

## 2011-02-05 ENCOUNTER — Other Ambulatory Visit: Payer: Self-pay | Admitting: Sports Medicine

## 2011-03-23 NOTE — Discharge Summary (Signed)
NAMETYLEK, BONEY NO.:  [id number]   MEDICAL RECORD NO.:  [id number]          PATIENT TYPE:  OBV   LOCATION:  3707                         FACILITY:  [facility]   PHYSICIAN:  Kasandra Knudsen, M.D.   DATE OF BIRTH:  [birth year]   DATE OF ADMISSION:  02/17/2008  DATE OF DISCHARGE:  02/18/2008                               DISCHARGE SUMMARY   ADMITTING DIAGNOSES:  1. Atrial fibrillation with rapid ventricular response.  2. Hypertension.  3. Chest pain.  4. Hyperlipidemia.  5. Chronic renal insufficiency.  6. Shoulder and hip pain.  7. Allergic rhinitis.  8. Tobacco use.   DISCHARGE DIAGNOSES:  1. Lone atrial fibrillation.  2. Hyperlipidemia.  3. Gastroesophageal reflux disease.  4. Hypertension.  5. Chronic shoulder and hip pain.  6. Allergic rhinitis.  7. Tobacco use.   HISTORY OF PRESENT ILLNESS:  This is a 50 year old male who was admitted  after being transferred from [facility] for an episode of  atrial fibrillation with rapid ventricular response.  The patient over  the past couple of days had the sensation of an elephant sitting on his  chest.  He states this only happened after eating meals.  It never  happened at rest.  It did not happen with exertion.  There was no  nausea, vomiting, or diaphoresis.  He was not short of breath.  There  was no radiation to the pain.  The patient took a sister's Nexium, and  the pain later resolved, and he felt that it was heartburn type pain.  He did go to a [facility], noted his blood pressure was low, and then went to  urgent care to have himself checked out where an EKG was obtained that  showed AFib with RVR.  He was subsequently transferred to [facility].   HOSPITAL COURSE:  1. Atrial fibrillation with rapid ventricular response:  This can be      considered a lone atrial fibrillation.  The patient was admitted      for telemonitor and had no further episodes of atrial fibrillation.      He  remained in normal sinus rhythm throughout this time.  It is      believed that this was one episode which likely caused      hypoperfusion and could have attributed to some of his chest pain.      Because it did not reoccur, it is not paroxysmal, and therefore he      did not require an anticoagulation.  However, he does need a more      extensive workup to determine the etiology.  His cardiac enzymes      remained negative.  After discharge, it was noted his TSH was low,      and his primary care Jessy Cybulski will follow this up in the clinic.      We did not consult cardiology; however, the patient will need a 2D      echo in the outpatient setting, and a possible cardiology referral      in the future.  At time  of discharge, we started him on a beta-      blocker to help his hypertension, which also may help with further      episodes of atrial fibrillation if this should occur.  2. Chest pain:  Resolved.  Likely this with is a dual component of      hypoperfusion secondary to the atrial fibrillation with rapid      ventricular response as well as some GERD.  It was noted that      Nexium relieved the pain and therefore we wrote for Nexium 40 mg at      discharge.  The patient may also take Tums or Mylanta to help with      his pain as well.  His primary care Imani Sherrin can follow up to see      whether there is any further episodes or if the PPI has indeed      relieved his pain.  3. Hypertension:  On admission, we stopped the patient's enalapril as      he had an approximately 25-30% bump in his baseline creatinine.      This could have been a component of the hypoperfusion or it could      have been related to his ACE inhibitor.  At the time of discharge,      we continue to hold his enalapril, and he may discuss whether to      restart it with his primary care Jla Reynolds.  He says his doctor has      told him that he needs it for renal protection.  If it is started,      it may be started  at a lower dose.  Please note that there was not      a greater than 35% increase in his creatinine; therefore, the      medicine can be restarted if needed.  At discharge, we started      carvedilol 3.125 mg p.o. b.i.d. to help control his blood pressure.      This may be titrated up or discontinued at the primary care      Makeila Yamaguchi's discretion.  4. Hyperlipidemia:  The patient is not on a statin.  We will follow up      with primary care Henli Hey.  5. Chronic shoulder and hip pain:  Throughout his hospital stay, the      patient was continued on his home dose of tramadol as well as      Percocet.  There were no complications during his stay.  6. Allergic rhinitis and seasonal allergies:  The patient was      continued on home dose of Allegra during his hospital stay.  7. Tobacco use:  The patient stated he wanted a low dose nicotine      patch as he feels that he likes the way it works.  He may continue      in outpatient setting.  He has considered quitting smoking, and      this may be a good time for him to do so.  He is worried that the      patch will not be covered by his insurance company, and he will      check on this when he is discharged from the hospital.   DISCHARGE MEDICATIONS:  1. Nexium 40 mg p.o. daily.  2. Carvedilol 3.125 mg p.o. b.i.d.  3. Aspirin 81 mg p.o. daily.  4. Tramadol 50 mg 1 to 2  pills q.6 h. p.r.n. pain.  5. Percocet 5/325 mg 1 tablet p.o. q.6 h. p.r.n.  6. Allegra 180 mg 1 pill daily.  7. Flonase 50 mcg 1 spray both nostrils b.i.d.   PERTINENT IMAGES:  Chest x-ray showed no acute process.   LABORATORY DATA:  TSH 0.268 low, cardiac enzymes remained negative,  initial creatinine was 2, on the day of discharge 1.68, hemoglobin  mildly decreased at 12.8.  All other labs remained within normal limits.   FOLLOWUP:  The patient will follow up with Dr. Darylene Price at [facility] hopefully this week.  He will call on February 19, 2008, to  set  up an appointment for the week.   FOLLOWUP ISSUES FOR PRIMARY CARE Kevia Zaucha:  The patient was discontinued  off his enalapril, may need to resume.  The patient will need a 2D echo  to further assess his valve as well as if this is the cause for his  atrial fibrillation.  In addition, his TSH was low.  This came back  after the patient was discharged, and he will need further thyroid  workup as this could have also have been the etiology of his atrial  fibrillation.      Kasandra Knudsen, M.D.  Electronically Signed     JT/MEDQ  D:  02/18/2008  T:  02/19/2008  Job:  [id number]   cc:   Clifton Custard, M.D.

## 2011-03-26 NOTE — Op Note (Signed)
[place]. [facility]  Patient:    Paul [name], Paul [name]                    MRN: [id number] Proc. Date: 04/02/00 Adm. Date:  [id number] Attending:  Konrad Dolores                           Operative Report  PREOPERATIVE DIAGNOSIS:  Postoperative right hip dislocation, anterior.  POSTOPERATIVE DIAGNOSIS:  Postoperative right hip dislocation, anterior.  OPERATION PERFORMED:  Closed reduction of right hip under general anesthesia. Adductor tenotomy.  SURGEON:  Mark C. Lorin Mercy, M.D.  ASSISTANT:  Kevan Ny, PA-C.  ANESTHESIA:  General.  DESCRIPTION OF PROCEDURE:  After induction of general anesthesia with Sublimaze given, the hip was distracted directly in line as the patient had mu fasciculations.  90/90 traction was then applied.  A clunk was noted and the hip had good range of motion and rotation at that point.  As the hip was extended near full extension, another clunk was appreciated suggestive of subluxation.  Reduction was repeated.  The knee was gently brought out withi no apparent clunk.  An x-ray was taken which showed the hip was not reduced. 90/90 traction was again applied and the hip reduced.  Then with gently taking the leg out near full extension, the hip was subluxed anteriorly.  The patient had a large area of old scar debrided from his previous wound which had allowed to granulate in when he was a teenager due to postoperative infection and this tightness of the scar laterally may have contributed to the hip instability.  The hip was reduced, taken down to 30 degrees of flexion.  X-ray was taken to confirm that the hip was indeed reduced.  Adductor was tight anD after a Betadine prep, sterile gloves, using a sterile technique, a 15 scalpel blade was used for percutaneous adductor release and a single 4-0 nylon suture was placed in the skin.  This allowed for increased adduction and the patient was placed in an abduction pillow with a  pillow behind the knee, strapped into the adduction pillow and then transferred to the recovery room where a repeat x-ray showed the hip was in satisfactory position and reduced.  Cup flexion was appropriate.  The stem and neck were in good position and appropriate length and radiographs did not reveal any malposition that would suggest instability.  The patient was transferred to the recovery room in stable condition. DD:  04/02/00 TD:  04/06/00 Job: [id number]

## 2011-03-26 NOTE — H&P (Signed)
NAME:  Paul [name], BLANK NO.:  [id number]   MEDICAL RECORD NO.:  [id number]          PATIENT TYPE:  EMS   LOCATION:  MAJO                         FACILITY:  [facility]   PHYSICIAN:  Madeleine B. Vanstory, M.D.DATE OF BIRTH:  11-30-1960   DATE OF ADMISSION:  12/06/2004  DATE OF DISCHARGE:                                HISTORY & PHYSICAL   CHIEF COMPLAINT:  Near syncope.   HISTORY OF PRESENT ILLNESS:  This is a 50 year old African-American male who  was at church today, standing for a long time, got really dizzy and warm, so  patient sat down, continued to get hotter and hotter, and his vision got  down to tunnel vision.  The patient had not eaten that morning nor drunk  anything and had taken a Percocet secondary to chronic head pain. EMS came  to the church.  The patient felt better before they brought him to the ED,  but he was diaphoretic.  No chest pain, mild shortness of breath.   REVIEW OF SYSTEMS:  Entirely negative now.  Previously, mild shortness of  breath and diaphoresis which resolved.   PAST MEDICAL HISTORY:  None except for a slipped epiphysis and right hip  pinning at 50 years old and right total hip replacement arthroplasty in  2002.   MEDICATIONS:  Occasional Percocet and chronic ibuprofen use.   Other MD is Dr. Wynelle Link.   FAMILY HISTORY:  Mom died at 53 with hypertension and cancer, father with  prostate cancer and diabetes.  Father died at 16 with diabetes.   SOCIAL HISTORY:  Separated with one child, on disability x 2 years.  Smokes  one-half pack per day, denies ETOH.  Positive for illicit.   PHYSICAL EXAMINATION:  VITAL SIGNS: Temperature 97.5, heart rate 65, blood  pressure 106/73, respiratory rate 18, 96% on room air.  GENERAL:  The patient has sweat pants on under pants.  Is not diaphoretic.  No acute distress.  Alert and oriented x 3.  HEENT:  Positive for scleral icterus.  Pupils equal, round, and reactive to  light.  Extraocular  movements intact.  Auditory canals patent.  Ears patent.  No rhinorrhea.  Moist mucous membranes.  NECK:  No lymphadenopathy, no thyromegaly.  LUNGS:  Clear to auscultation.  HEART:  Regular rate and rhythm, 3/6 murmur blowing systolic.  No edema.  ABDOMEN:  No liver border palpated.  No pain with palpation of the abdomen.  SKIN:  No rashes.  NEUROLOGIC:  Cranial nerves II-XII intact.   LABORATORY DATA AND OTHER STUDIES:  EKG with T wave abnormality, unchanged  since March 2004.   Point-of-care cardiac enzymes negative x 2.  Electrolytes within normal  limits.  CBC:  White count 10.6, hemoglobin 11.7.  Total bilirubin and  indirect bilirubin mildly elevated at 1.4 and 1.3, respectively.  AST 19,  ALT 23.   ASSESSMENT AND PLAN:  A 50 year old African-American male who had a near  syncopal episode today.  1.  Near syncope:  The patient never lost consciousness, patient with      abnormal electrocardiogram but no change since March 2004  and no      arrhythmia.  This happened after standing for a long period of time.      Therefore, could be that patient had eaten or drunk, took a Percocet, so      could be orthostatic or dehydration.  The patient not orthostatic here      after eating and taking fluids.  Point-of-care enzymes negative x 2.  We      will admit for 23-hour observation in telemetry bed, give IV fluids, and      repeat cardiac enzymes x 3.  2.  Acute renal insufficiency with creatinine of 2.1.  Unsure of patient's      baseline.  The patient takes NSAIDs chronically secondary to head pain      in patient with no p.o. this morning.  Could be prerenal.  He is taking      p.o. now.  Will educate patient on NSAID use and will hydrate.  3.  Chronic right hip pain.  The patient is with slipped epiphysis as a      teenager and multiple surgeries on his hips with chronic bilateral hip      pain.  Will treat with Percocet.  The patient will need to follow up as      an  outpatient.      MBV/MEDQ  D:  12/06/2004  T:  12/06/2004  Job:  [id number]

## 2011-03-26 NOTE — Op Note (Signed)
[place]. [facility]  Patient:    JAXTIN, RAIMONDO                    MRN: [id number] Proc. Date: 03/30/00 Adm. 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  OPERATION PERFORMED:  Right total hip arthroplasty, cement femur, press-fit acetabulum.  SURGEON:  Mark C. Lorin Mercy, M.D.  ASSISTANT:  Yetta Barre. Marlou Sa, M.D.  SECOND ASSISTANT:  Kevan Ny, PA-C  ANESTHESIA:  General orotracheal.  ESTIMATED BLOOD LOSS:  1400 ml.  INDICATIONS FOR PROCEDURE:  COMPONENTS USED:  Secure-Fit HAPSL acetabular 56 mm shell, #6 cemented hip stem ODC, Omni-Fit 14 mm spacer and [employer] cement 10 degree liner and size 4 cement plug plus 0 neck length.  DESCRIPTION OF PROCEDURE:  After satisfactory general anesthesia, the patient was placed in lateral position and preoperative Ancef was given prophylactically.  The patient had an old 3 cm wide long incision from his old surgery where his wound had secondarily granulated in which left a depressed area stuck down to the fascia about 2 cm.  This area of the scar was excised. The skin was undermined.  The fascia was divided.  No sutures were encountered and the fascia was thick, hypertrophic, tense.   The fascia had to be elevated off the trochanter anteriorly and posteriorly.  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  No obvious  piriformis tendon was present.  A plane was developed and cobra retractors were placed laterally.  The sciatic nerve was palpated through the tense scar tissue but was not dissected free.  The lateral capsule was excised.  The inferior capsule was excised and due to the extreme tightness of the hip and rotation of only 5 to 10 degrees internal rotation and 10 degrees external rotation even with the acetabulum and head exposed, the neck was cut.  The head was out of shape and was adherent and could not be removed with the corkscrew and the head had to be quartered to be removed in pieces.  It showed severe destructive changes but there was only a small amount of serous hip joint fluid.  Cultures were obtained but there was evidence of infection present.  Once remaining pieces of the head was removed, a second neck cut was made one fingerbreadth above the lesser trochanter.  Due to the old plate, there was scar tissue and dense bone that filled in the femoral canal.  This prevented the canal starter reamer from being passed with the hand technique.  It was placed on power and initially with attempt to ream, the anterior cortex was breached a distance of 2 cm from the cut.  The medial calcar was intact as was the posterior cortex and the trochanter.  A compression hip screw device was then used to start the hole followed by the canal starter reamer and sequential reaming approaching.  Due to the defect and the  fact that the patient had a derotation osteotomy previously, standard press-fit on the femoral side was not an option due to the deformity of the proximal femur.  It was elected to place a cement restrictor, pulsatile irrigation and proceed with a cemented stem as the choice.  After irrigating, a sponge was placed down the canal, attention was turned to the acetabulum. Sequential reaming was performed up to good bleeding bone 56 mm size.  There was excellent fit using the ASIS to sciatic  notch for orientation.  For cup version the cup was placed at 45 degrees of abduction and 25 degrees of flexion.  Trials were used with the broach on the femoral side with the findings of excellent stability.  There was less than 1 mm shuck flexion to 80 degrees internal rotation, to 90 degrees with good stability with head adducted.  The hip would reach full external rotation.  The rectus was tight but the knee would still flex 90 degrees with the hip in neutral position and with it extended 20 degrees.  The anterior capsule was divided using a bone hook to hook the capsule and then divided with a long scalpel blade.  Permanent acetabulum was placed, central hole plugged, permanent acetabular shell inserted.  10 degree liner with the posterolateral 10 degree wall buildup.  Cement was vacuum mixed, packed with a glue gun, prosthesis held times 15 minutes with the collar flush with the calcar.  The calcar had been reamed prior to implantation.  The canal was meticulously dried after pulsatile irrigation and sequential sponging. The hip was reduced.  Stability was checked.  There was good stability in external rotation and full extension, good stability in 90 degrees flexion, internal rotation to 80 degrees.  Hemovac drain was placed followed by closure of the fascia which was extremely tight.  The iliotibial band was scarred down and multiple sutures had to be placed in order to get it closed over the top of the trochanter, subcutaneous tissue and then skin staples.  The patient tolerated the procedure well and was placed in a knee immobilizer and transferred to the recovery room.  Instrument and needle count was correct. D:  04/02/00 TD:  04/06/00 Job: [id number]

## 2011-03-26 NOTE — Discharge Summary (Signed)
NAME:  Paul [name], Paul [name]                       ACCOUNT NO.:  [id number]   MEDICAL RECORD NO.:  [id number]                   PATIENT TYPE:  INP   LOCATION:  7544                                 FACILITY:  [facility]   PHYSICIAN:  Gaynelle Arabian, M.D.                 DATE OF BIRTH:  28-Sep-1961   DATE OF ADMISSION:  01/29/2003  DATE OF DISCHARGE:  02/03/2003                                 DISCHARGE SUMMARY   ADMISSION DIAGNOSES:  1. Painful right total hip.  2. Status post primary right total hip replacement arthroplasty two years     ago.  3. Status post hip pinning (in the eighth grade).   DISCHARGE DIAGNOSES:  1. Loose right total hip arthroplasty, status post right total hip     replacement arthroplasty revision.  2. Status post primary right total hip replacement arthroplasty two years     ago.  3. Status post hip pinning (in the eighth grade).   PROCEDURES:  The patient was taken to the OR on January 28, 2003, and  underwent revision of a painful loose right total hip.  Surgeon:  Gaynelle Arabian, M.D.  Assistant:  Alexzandrew L. Dara Lords, P.A.  Surgery under  general anesthesia.  Estimated blood loss 1750 mL, replacement with three  units of packed red cells.  Hemovac drain x 1.   HISTORY OF PRESENT ILLNESS:  The patient is a 50 year old white male who had  a right total hip arthroplasty secondary to arthrosis of the right hip.  He  had subcapital femoral apophysis as a teenager.  He had a hip pinning and  initially did well.  Then approximately two years ago, he underwent a  primary right total hip for the hip arthrosis.  He initially did well,  however, recently was noted to have loosening of his femoral component.  He  was seen and evaluated by Gaynelle Arabian, M.D., for a second opinion and  presents now for possible revision versus resection arthroplasty depending  on intraoperative cultures.  His preoperative infection work-up was  negative.  The risks and benefits of the  procedure have been discussed with  the patient.  It was elected to proceed with surgery.   LABORATORY DATA:  The CBC on admission showed a hemoglobin of 11.4,  hematocrit 35.7, white cell count 8.4, and red cell count 4.64.  Postoperative H&H 8.9 and 27.1.  This continued to climb down to 7.9 and  23.4.  Given blood.  Post transfusion, hemoglobin back up to 9.1 and 27.3.  Last noted H&H 9.6 and 28.6.  The differential on the admission CBC was all  within normal limits.  The PT and PTT on admission was 13.6 and 33,  respectively.  The INR was 1.0.  The pro time was followed by Coumadin  protocol.  The last noted PT and INR were 18.2 and 1.6, respectively.  The  chemistry panel on admission was  all within normal limits with the exception  that the total bilirubin was minimally elevated at 1.5.  Serial BMETs were  followed.  The sodium dropped from 139 to 133 and back up to 140.  The  glucose went up from 98 to 127 and last noted at 134.  The calcium dropped  from 9.4 to 7.6 and back up to 8.1.  The urinalysis only showed trace blood  with a few epithelial cells, 0-2 white cells, and 0-2 red cells.  Blood  group and type O+.  Intraoperative cultures:  Gram's stain showed no  organism, tissue culture showed no wbc's and no organisms on Gram's stain  and no growth on the culture, anaerobe culture with no organisms seen and no  anaerobes isolated.   The EKG dated January 25, 2003, showed normal sinus rhythm, minimal voltage  criteria for LVH, nonspecific T-wave abnormalities, and ST abnormalities  slightly more marked than Mar 28, 2000, confirmed by [name] Argyle, M.D.  Postoperative films of pelvis and femur on January 29, 2003, showed no  hardware or bony abnormalities seen and status post revision right hip  replacement.   HOSPITAL COURSE:  The patient was admitted to [facility].  He was  taken to the OR and underwent the above-stated procedure without  complication.  The patient  tolerated the procedure well and was later  transferred to the recovery room and then to the orthopedic floor for  continued postoperative care.  Vital signs were followed.  The patient was  given 24 hours of postoperative IV antibiotics.  Placed on Coumadin for DVT  prophylaxis.  Given PCA analgesia for pain control following surgery  supplemented by p.o. medications.  On postoperative day #1, the patient did  have some itching.  The PCA was discontinued and switched over to another  medication.  He was given Atarax.  He also had a sore throat and was given  some Magic mouthwash and some hydrocortisone cream for an irritated area on  his neck.  Fluids were KVO.  PT and OT were consulted to assist with gait  training ambulation.  The patient progressed very well with period of time,  up ambulating approximately 200 feet by postoperative day #1 and 220 feet by  postoperative day #2.  He continued to do very well with his physical  therapy.  On day #2, the PCA was discontinued.  He did have a drop in his  hemoglobin again down 7.8.  He was given blood.  He was also noted to have  some mild hyponatremia.  Fluids were KVO and the IVs were discontinued after  the blood was transfused.  He responded well.  The hemoglobin came back up  to 9.1.  Dressing change initiated on postoperative day #2.  The incision  was healing well.  Electrolytes improved.  He was doing much better with his  pain control.  He continued to receive therapy and progressed well.  On  February 03, 2003, the patient was doing quite well.  He was independent with  his mobility, tolerating p.o. medications, and was discharged home.   DISPOSITION:  The patient was discharged home on February 03, 2003.   DISCHARGE MEDICATIONS:  1. Percocet for pain.  2. Robaxin for spasm.  3. Coumadin for DVT prophylaxis.   DIET:  As tolerated.   ACTIVITY:  Total hip protocol with touch down weightbearing of the right lower extremity.  Continue  with home health PT and home health nursing  through [facility].   FOLLOW-UP:  Follow up two weeks from surgery.  Call the office for an  appointment.   CONDITION ON DISCHARGE:  Improved.     Alexzandrew L. Dara Lords, P.A.              Gaynelle Arabian, M.D.    ALP/MEDQ  D:  02/13/2003  T:  02/14/2003  Job:  [id number]

## 2011-03-26 NOTE — H&P (Signed)
[place]  Patient:    Paul [name], Paul [name]                    MRN: [id number] Adm. Date:  [id number] Attending:  Konrad Dolores Dictator:   Denice Bors. Robeson, P.A.C.                         History and Physical  CHIEF COMPLAINT:  Right hip pain for several years.  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  Previous right hip osteotomy, hardware placed and wound was left to heal under secondary intention, secondary to infection and poor granulation; this was also a postoperative infection at the time, Dr. Evelena Peat R. Wilson, then later, he had a left subtrochanteric osteotomy performed by Dr. Richardson Landry.  Patient does state he has had a greater than five-year history of increased hip pain and weakness, especially with walking. Patient states Voltaren has slightly ameliorated the pain but over the years, Voltaren has lost its effectiveness.  ALLERGIES:  No known drug allergies and no known allergies.  MEDICATIONS: 1. Voltaren 75 mg b.i.d. 2. Tylenol Extra-Strength p.r.n.  FAMILY HISTORY:  Family history on his maternal side includes his mother having hypertension.  His brother was a diabetic but recently he did pass away.  SOCIAL HISTORY:  Positive for smoking x 15 years.  He previously smoked one to two packs a day but he says he is currently down to about 8 to 10 cigarettes per day right now.  Does drink alcohol on a daily basis and denies illicit drug use at this time.  REVIEW OF SYSTEMS:  GENERAL:  Denies any weight change, fatigue, weakness, fever, chills or night sweats.  SKIN:  Skin is intact.  Hair in normal distribution.  No recent nail changes, pruritus, rashes, sores, lumps or moles.  HEENT:  Head is atraumatic.  Denies any  trauma, concussions, nausea, vomiting, visual changes.  Eyes:  Denies wearing glasses or contacts, blurriness, tearing, itching, acute visual loss.  Ears:  No hearing loss.  No tinnitus.  No vertigo, discharge or earaches.  Nose and sinuses:  Denies rhinorrhea, stuffiness, sneezing, itching, allergies, epistaxis. Mouth/throat/neck:  Denies bleeding gums, hoarseness, sore throat, swollen neck.  BREASTS:  Within normal limits.  He denies any skin changes of the breasts, masses, lumps or discharge.  RESPIRATORY:  Denies shortness of breath, wheezing, cough, sputum, hemoptysis or any asthma, bronchitis, emphysema or tuberculosis.  CARDIAC:  Denies hypertension, murmurs, angina, palpitations, dyspnea on exertion, orthopnea, PND, edema.  GI/GU:  Denies any change in appetite, nausea, vomiting, diarrhea, indigestion, changes in bowel movements, hematochezia, constipation, bleeding, melena, abdominal pain, jaundice, hepatitis.  He denies frequency.  Denies sense of urgency, polyuria, polydipsia, hematuria or nocturia.  GENITAL:  Denies any sores, discharge, vesicles of external genitalia.  VASCULAR:  Denies any leg edema, claudication, varicose veins or emboli.  MUSCULOSKELETAL:  He denies muscle weakness.  Positive pain.  Positive joint stiffness to the right hip.  Admits to decreased range of motion and instability.  Denies redness.  Denies swelling of his hip.  Does admit to arthritis of the hips.  NEUROLOGIC: Denies loss of sensation, numbness, tingling, tremors, weakness, paralysis, fainting, blackouts, seizures.  HEMATOLOGICAL:  Denies  anemia, easy bruising, bleeding, petechiae, purpura, transfusions.  ENDOCRINE:  Denies heat or cold intolerances, excess sweating, polyuria, polydipsia, polyphagia, thyroid problems nor diabetes.  PSYCHIATRIC:  Denies mood, anxiety, depression.  PHYSICAL EXAMINATION:  GENERAL:  This is a 51 year old African-American male, alert and oriented x  3, well-developed, well-nourished, slightly obese, in no apparent distress.  VITAL SIGNS:  Temperature 98.3, pulse 80 and regular, respirations 20 and regular, blood pressure 142/84.  SKIN:  No skin rashes or infection over the site.  No tattoos or bruises. Hair consistency within normal limits.  HEENT:  Head normocephalic, atraumatic.  Eyes:  PERRLA.  EOMs intact.  Sclerae are white.  No papilledema.  Ears:  TMs pearly gray bilaterally without erythema or discharge.  Nose:  Septum is midline.  No tenderness.  No crepitation.  No erythema of nasal mucosa.  Mouth and throat:  No dentures. Teeth intact.  No caries.  No erythema.  NECK:  No lymphadenopathy, thyromegaly or carotid bruits.  BREASTS:  Within normal limits.  HEART:  Regular rate and rhythm, without murmurs, gallops or rubs.  S1 and S2. No clubbing, cyanosis, or edema.  Pulses are +2/4 bilaterally, upper extremities and lower extremities.  LUNGS:  Clear to auscultation bilaterally, without wheezing, rhonchi or rales.  ABDOMEN:  Soft.  Normoactive bowel sounds in four quadrants.  No masses, tenderness or organomegaly.  No CVA tenderness.  RECTAL:  Deferred.  GU:  Also deferred by patient.  MUSCULOSKELETAL:  No muscle atrophy.  A large surgical scar over his right hip going vertically to posterior, probably 12 inches long, vertical.  Poorly healed surgical scar from previous surgical instrumentation.  Some weakness. Tenderness with range of motion.  Range of motion is decreased to 3/5 on the right as compared to the left; tenderness with range of motion.  Deep tendon reflexes are 2/4 bilaterally in lower extremities and upper extremities. Decreased internal rotation to approximately 35 degrees and external rotation to approximately 35 degrees.  Gait is intact.  VASCULAR:  Pulses +2/4 bilaterally.  LYMPHATICS:  No lymphadenopathy.  NEUROLOGIC:  Cranial nerves II-XII are grossly intact.  X-RAY FINDINGS:  Positive  for right hip osteoarthritis with degenerative changes, bone-to-bone rubbing and head-of-the-femur flattening.  IMPRESSION:  Right hip osteoarthritis.   PLAN:  The plan is to proceed with a right total hip arthroplasty.  Patient and Dr. Elta Guadeloupe C. Lorin Mercy discussed the possibility of infection after this surgery, further arthritis, risks of bleeding, refracture, re-dislocation, the risks of prosthesis removal, reoperation or possibly revision or death. Patient had a chance to ask questions; all questions were asked and answered ______ .  Patient agrees to go ahead with surgery and requested to proceed. DD:  03/30/00 TD:  03/31/00 Job: [id number]

## 2011-03-26 NOTE — Op Note (Signed)
NAME:  Paul [name], Paul [name]                       ACCOUNT NO.:  [id number]   MEDICAL RECORD NO.:  [id number]                   PATIENT TYPE:  INP   LOCATION:  0099                                 FACILITY:  [facility]   PHYSICIAN:  Gaynelle Arabian, M.D.                 DATE OF BIRTH:  12-13-1960   DATE OF PROCEDURE:  01/29/2003  DATE OF DISCHARGE:                                 OPERATIVE REPORT   PREOPERATIVE DIAGNOSIS:  Loose right total hip arthroplasty.   POSTOPERATIVE DIAGNOSIS:  Loose right total hip arthroplasty.   PROCEDURE:  Right total hip arthroplasty revision.   SURGEON:  Gaynelle Arabian, M.D.   ASSISTANT:  Alexzandrew L. Dara Lords, P.A.   ANESTHESIA:  General.   ESTIMATED BLOOD LOSS:  1750 cc.   FLUIDS:  Replacement three units of packed red blood cells.   DRAIN:  Hemovac x1.   COMPLICATIONS:  None.   CONDITION:  Stable to recovery.   BRIEF CLINICAL NOTE:  The patient is a 50 year old male who had a right  total hip arthroplasty secondary to arthrosis of the right hip.  He had a  subcapsular femoral apophysis as a teenager.  The procedure was performed  approximately 2-1/2 years ago, and he initially did well.  Dr. Aline August saw him  recently and noted that he had a loose femoral component, and had planned on  revision.  The patient subsequently came for a second opinion and presents  now for possible revision versus resection arthroplasty, depending on  intraoperative cultures.  His preop infection workup is negative.   PROCEDURE IN DETAIL:  After the successful administration of general  anesthetic, the patient was placed in the left lateral decubitus position  with the right side up and held with a hip positioner.  Right lower  extremity was isolated from his perineum with plastic drapes.  He was  prepped and draped in the usual sterile fashion.   A standard posterolateral incision was made using a distal aspect of his old  incision, and proximally and posterolateral  direct posterior.  The skin was  closed with a #10 blade, through subcutaneous tissue and to the level of  fascia lata -- which was incised in line with the skin incision.  The  sciatic nerve was palpated and protected, and the posterior pseudocapsule  excised off the femur.  He had heterotopic bone present in his capsule, and  that was all excised.   The hip was extremely frozen down.  I had to do a lot of soft tissue release  in order to dislocate it.  Once dislocated I removed some overhanging  osteophyte and heterotopic bone over the stem.  I then removed the stem  easily with gentle retraction.  The cement is then removed piecemeal,  utilizing the [facility] cement extraction device.  I was then able to remove  all the cement all the way down to the plug, and  then remove the distal  plastic plug in the canal.  There was a small pedestal.  I drove through the  pedestal, placed the guide rod distally.  We sent several specimens for  frozen from the contents of the femoral canal, which was consistent with a  granulomatous __________  .  We also sent Gram-Stain CNS of that and fluid.  All of the Gram-Stains came back negative, and the frozen section was  consistent with chronic inflammation; with no evidence of any acute  inflammation.   At this point I decided to proceed with formal revision.  We then placed the  beaded guide rod distally into the canal, to go past the pedestal.  I reamed  the canal with flexible reamers up to 16.5 mm, to allow a 15 mm long bowed  stem.  I then prepared the femur proximally with a 53F proximal reamer and  machined the sleeve to a large.  Sludge is placed into the canal, and then  femur retracted anteriorly and acetabulum addressed.  The acetabular  position was in a significant amount of anteversion, beyond what I would  normally like.  Because of this I decided to remove the cup and place it in  more anatomic position.  I first removed the polyethylene  liner, then used  the [place] curved extraction equipment to disrupt the interface between  the bone and prosthesis.  Then the acetabular shell was easily removed, with  minimal if any bone loss.  The anterior and posterior columns remained  intact.  The size of the cup we removed was 56 mm, and I reamed up to 59 mm.  I wanted to fill the acetabulum more lateral, because it was too far medial  initially.  We placed 15 cc of the I.C. graft chamber bone graft, and then  reverse reamed with a 59 to create a more concentric acetabular bed.  I then  placed a 60 mm Pinnacle Multi-Hull acetabular shell in anatomic position,  and transfixed it with four domed screws with excellent purchase.  I then  placed a 36 mm neutral aligner.   I placed the trial 53F large sleeve with a trial 20 x 15 stem, with 36+ 8  neck and a 36+ zero head.  I first tried the calcar replacement stem, but it  added too much length and we could not reduce it.  Then with the 36+ 8 it  was easily reduced, and we had excellent stability with full extension,  flexion and rotation (70 degrees flexion, 40 degrees adduction and 90  degrees internal rotation, and 90 degrees flexion).  All of the trials were  removed and the permanent 36 mm neutral metal aligner was placed into the  acetabular shell for a metal-on-metal construct.  We then placed the  permanent 20-F large sleeve into the proximal floor, and then a 20 x 15 long  __________  stem with a 36+ 8 neck.  The version was just slightly beyond  neutral, because of his abnormal anatomy.  I did not want to antevert too  much for fear of anterior dislocation.  I just matched the trial and the  stem, and did end up filling in that position.  We then placed a permanent  36 to 0 head, reduced the hip;  we had the same stability parameters.  The  wound was then copiously irrigated with antibiotic solution.  X-rays taken AP and lateral to confirm that the stem distally is in the  canal.  The wound  is then closed over a Hemovac drain with interrupted #1 Vicryl in the  fascia, #1 and 2-0 Vicryl in the subcutaneous.  The skin was closed with  staples.  The incision is cleaned and dried and a bulky sterile dressing  applied.  Drains set to suction.  He was placed into a knee immobilizer,  awakened and then transported to recovery room in stable condition.                                                Gaynelle Arabian, M.D.    FA/MEDQ  D:  01/29/2003  T:  01/29/2003  Job:  [id number]

## 2011-03-26 NOTE — H&P (Signed)
NAME:  Paul [name], Paul [name]                       ACCOUNT NO.:  [id number]   MEDICAL RECORD NO.:  [id number]                   PATIENT TYPE:  INP   LOCATION:  [place]                                 FACILITY:  [facility]   PHYSICIAN:  Dione Plover. Aluisio, M.D.              DATE OF BIRTH:  January 05, 1961   DATE OF ADMISSION:  01/29/2003  DATE OF DISCHARGE:  02/03/2003                                HISTORY & PHYSICAL   CHIEF COMPLAINT:  Right hip pain.   HISTORY OF PRESENT ILLNESS:  The patient is a 50 year old male who has been  seen by Dr. Wynelle Link for ongoing right hip pain.  He had a complicated  history having undergone pinning for a slipped epiphysis as a teenager and  then a subsequent osteotomy with placement of a compression hip screw.  He  developed some arthritic changes and had previously undergone a total hip  replacement about a year and one-half ago.  He initially did well but over  the past six to eight months he has had increasing pain.  He has been seen  by Dr. Wynelle Link for ongoing pain.  He has undergone a workup and found to  have a loosening cemented stem which is possibly due to infection.  It is  known that he would need to have the right total hip revised.  Risks and  benefits of this procedure have been discussed with the patient at length  and he has elected to proceed with surgery.   ALLERGIES:  No known drug allergies.   CURRENT MEDICATIONS:  Ibuprofen stopped prior to surgery.   PAST MEDICAL HISTORY:  Negative.   PAST SURGICAL HISTORY:  1. Right hip pinning as a teenager.  2. Right total hip replacement 1-1/2 years ago.   SOCIAL HISTORY:  The patient is separated, has one child, about a 1/3 pack-  per-day smoker, seldom intake of alcohol.  He is a Hotel manager.   FAMILY HISTORY:  Mother deceased age 44 with a history of hypertension and  cancer.  Father living age 52 with a history of prostate cancer and  diabetes.  Brother deceased with diabetes at age 13.   REVIEW  OF SYSTEMS:  GENERAL:  No fevers, chill, night sweats.  NEUROLOGICAL:  No seizures, syncope, paralysis.  RESPIRATORY:  No shortness of breath,  productive cough, or hemoptysis.  CARDIOVASCULAR:  No chest pain, angina,  orthopnea.  GI:  No nausea, vomiting, diarrhea, constipation.  GU:  No  dysuria, hematuria, discharge.  MUSCULOSKELETAL:  Pertinent of that of the  right hip found in the History of Present Illness.   PHYSICAL EXAMINATION:  VITAL SIGNS:  Pulse 84, respirations 12, blood  pressure 148/84.  GENERAL:  The patient is a 50 year old African-American male, well nourished  and well developed.  In no acute distress.  HEENT:  Normocephalic, atraumatic.  Pupils round and reactive.  EOMs intact.  NECK:  Supple.  A faint  right bruit versus referred murmur from the thoracic  cavity.  CHEST:  Clear to auscultation anterior and posterior chest walls with a  faint crackle noted on the right mid pulmonary field.  This does clear with  coughing.  HEART:  Regular rate and rhythm with a crescendo-decrescendo 3/6 early  murmur heard at pulmonic Erb's and mitral point.  ABDOMEN:  Soft, flat, nontender.  Bowel sounds are present.  RECTAL, BREASTS, GENITALIA:  Not done and not pertinent to present illness.  EXTREMITIES:  Except as to the right lower extremity, the right hip range of  motion shows flexion of about 75 degrees.  There is no rotation internal or  external.  Only has about 20 degrees of abduction.  Motor function is  intact.   IMPRESSION:  Painful right total hip.   PLAN:  The patient will be admitted to [facility] and undergo  revision of the right total hip arthroplasty versus a resection arthroplasty  and placement of antibiotic spacer.  Surgery is to be performed by Dr. Gaynelle Arabian.     Alexzandrew L. Dara Lords, P.A.              Dione Plover Aluisio, M.D.    ALP/MEDQ  D:  03/07/2003  T:  03/07/2003  Job:  [id number]

## 2011-03-26 NOTE — Discharge Summary (Signed)
[place]  Patient:    Paul [name], Paul [name]                    MRN: [id number] Adm. Date:  03/30/00 Disc. Date: 04/08/00 Attending:  Gilmore Laroche Dictator:   Denice Bors. Robeson, P.A.-C.                           Discharge Summary  ADMISSION DIAGNOSIS:   Right hip osteoarthritis with degenerative disease.  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  PROCEDURES: 1. On Mar 30, 2000, Paul [name] underwent a right total hip arthroplasty,    cement femur, Press-Fit acetabulum.  Surgeon was [employer]. Lorin Mercy, M.D.,    Yetta Barre. Marlou Sa, M.D., assistant was Denice Bors. Robeson, P.A.-C.  Anesthesia    was general orotracheal.  Estimated blood loss was 1400 ml.  Components    used were Secur-Fit HAPSO acetabular 56 mm shell, #6 cemented hip stem ODC    Omnifit 14 mm spacer with [employer] cemented 10-degree linear and size 4    cement plug plus 0 neck length. 2. On Apr 02, 2000, with a preoperative diagnosis of postoperative right hip    dislocation anterior.  Operation performed was a closed reduction of the    right hip under general anesthesia, adductor tenotomy.  Surgeon was [employer].    Lorin Mercy, M.D., assistant Denice Bors. Robeson, P.A.-C.  Anesthesia was general.  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  However, at this time, the right hip is far worse than the left hip.  Pain is worse with ambulation.  He has undergone a previous right hip osteotomy; however, replaced pin due to a secondary to infection.  It was left to heal under secondary intention, which resolved with poor granulation tissue.  This was done by Dr. Eligha Bridegroom, and then later he had a left subtrochanteric osteotomy performed by Dr. ______ .  At this current time, the patient states he has had a greater than five-year  history of increasing hip pain and weakness, especially with walking.  The pain is relieved by rest and elevation of the lower extremity.  The patient relates the pain is a 7/10 on an average day.  The patient denied any other associated symptoms such as numbness or tingling of the leg.  He has failed multiple nonsteroidal anti-inflammatories including Voltaren. Voltaren was the last nonsteroidal anti-inflammatory he used, and it slightly ameliorated his pain.  ALLERGIES:  No known drug allergies.  MEDICATIONS: 1. Tylenol extra strength 500 mg 1 tablet q.4-6h. p.r.n. 2. Voltaren 75 mg p.o. b.i.d.  FAMILY HISTORY:  On the maternal side, he had hypertension.  His brother is a diabetic; however, he did recently pass away.  SOCIAL HISTORY:  Has been a cigarette smoker for 15 years, one pack per day. He previously smoked one to two packs, but he is currently down to 8-10 cigarettes.  Denies drinking alcohol on a daily basis or illicit drug use.  PHYSICAL EXAMINATION:  GENERAL:  This is a 50 year old African-American male, alert and oriented x 3. Well-developed, well-nourished, slightly obese.  In no apparent distress.  SKIN:  No active rashes or infections of the surgical site.  No tattoos or bruises.  He does have a large, 12-inch, surgical scar over  his right hip in a posterior vertical fashion.  This is not well-healed.  HEENT:  Normocephalic, atraumatic.  Eyes PERRLA.  EOMs intact.  Sclerae white. No papilledema.  Ears:  TMs are clear bilaterally, without erythema or discharge noted.  Nose:  Septum is midline, no tenderness or crepitus noted. No erythema in the nasal mucosa.  Mouth and throat:  No dentures.  Teeth are intact, no caries.  No erythema or exudates of the oropharynx.  NECK:  Supple.  No lymphadenopathy or thyromegaly, carotid bruits, or JVD.  HEART:  Regular rate and rhythm.  Without murmurs, gallops, or rubs.  S1, S2. No clubbing, cyanosis, or  edema.  EXTREMITIES:  Pulses of the lower extremity are +2/4.  LUNGS:  Clear to auscultation bilaterally, without wheezing, rhonchi, or rales.  ABDOMEN:  Soft.  Normoactive bowel sounds x 4 quadrants.  No masses or hepatosplenomegaly.  No CVA tenderness.  MUSCULOSKELETAL:  No muscle atrophy.  Weakness noted in the right hip with flexion and extension as compared to the left.  Tenderness with range of motion.  Deep tendon reflexes are intact, +2/4 bilaterally in the lower and upper extremities.  He has decreased internal rotation at approximately 35 degrees, and external rotation is also approximately 35 degrees.  His flexion is 0-90 degrees.  Gait is intact at this time.  Vascular:  Pulses +2/4 bilaterally.  No lymphadenopathy present.  NEUROLOGIC:  Cranial nerves II-XII grossly intact.  LABORATORY DATA:   X-rays at the time were positive for right hip osteoarthritis, degenerative changes, bone-on-bone rubbing, and head of femur flattening.  On Mar 30, 2000, white blood count of 12.5, ______ , hemoglobin 9.7, hematocrit 30.9, MCV 77, MCHC 31.5, platelets 187.  Prior to that on Mar 29, 1999, white blood count was 8.1, rbcs 5.0, hemoglobin 12.7, hematocrit 39.4, MCV 37.6, MCHC 32.3, and platelets 222.  Mar 28, 2000, he had an EFR of 9.  His chemistry panel on Mar 28, 2000, was sodium 141, potassium 4.1, chloride 105, carbon dioxide 31, glucose 87, BUN 11, creatinine 1.3, calcium of 9.6, TP 6.6, albumin 4.9, AST 18, ALT 17, ALP 58, and total bilirubin was 1.4.  Urinalysis was negative.  HOSPITAL COURSE:  On Mar 30, 2000, he underwent a right hip total hip arthroplasty performed by Dr. Rodell Perna.  Also on Mar 30, 2000, pharmacy protocol for Coumadin was implemented, Coumadin of 7.5 mg x 1.  A teaching booklet was also given, and daily PT/INRs and CBC was ordered.  Also on Mar 30, 2000, the patient was doing fairly well.  He only complained of slight pruritus secondary to the  morphine.  His vital signs that day were maximum temperature of 96.8, pulse of 89, respirations 20, BP 139/69, oxygen was 100%. His laboratories revealed a white blood count of 12.5, hemoglobin of 9.7,  hematocrit of 30.9, platelets of 187.  For the pruritus, he was ordered Benadryl.  We will monitor his H&H at that time.  His INR on Mar 31, 2000, which was postoperative day #2, was 1.5.  Also noted on Mar 31, 2000, his hemoglobin was 7.8 and hematocrit was 22.9.  One unit of packed rbcs was ordered.  On Apr 01, 2000, postoperative day #2, we discontinued his PCA morphine.  His hemoglobin was 8.1.  His sciatic nerve was intact.  H&H pending.  Culture and sensitivity negative so far for his urine.  PT was ordered for touchdown weightbearing.  Hemovac was also pulled.  On Apr 01, 2000,  PT/INR was 17.1 and 1.7.  He was continued on Coumadin 7.5.  OT evaluation was completed also on Apr 01, 2000.  On Apr 01, 2000, the PT noted the patients mobility was limited by decreased range of motion of the left hip, decreased flexion, and internal rotation, so a stat x-ray was ordered by Dr. Lorin Mercy on Apr 01, 2000. The hip x-ray revealed subluxation of the hip.  The x-ray revealed right hip prosthesis with superior subluxation of ___ components on the official radiology report.  It was discussed with the patient the need for closed reduction.  Wife was called, and discussed at bedside.  The plan was discussed with the patient, and he did agree to proceed with a return to the operating room for closed reduction of the right hip secondary to the subluxation.  On Apr 02, 2000, postoperative day #3, he underwent at, just past midnight, closed reduction of the right hip.  He was seen on Apr 02, 2000, postoperative day #3.  He was doing well.  He had good alignment on his follow-up x-ray from the closed reduction.  He had no bowel movement at this time.  Vital signs this day were 101.1, pulse of 90,  respirations 20, 138/80.  Hemoglobin was 8.1.  On Apr 02, 2000, his PT/INR was 18.1 and 9.1.  On Apr 03, 2000, postoperative day #4/#1, his hemoglobin was 8.0.  He had excellent pain control.  He has not had a bowel movement at this time.  We planned for fiberglass spica cast to be placed on Apr 04, 2000.  The patient was also discussed of the risks and ramifications of the spica cast in order to prevent dislocation.  The patients wife and himself also wished to talk to Dr. Eulas Post, who graciously agreed to see the patient.  On Apr 03, 2000, PT/INR was 18/1.9.  On Apr 03, 2000, orthopedic consultation was done by Dr. Eulas Post, who agreed to go ahead with the spica cast.  On Apr 04, 2000, postoperative day #5/#2, planned for a spica cast to be placed today.  He had no complaints this day.  Vital signs revealed maximum temperature 99.8, pulse 85, respirations 20, 130/86 blood pressure.  His hemoglobin was 8.3 and hematocrit 25.4.  On Apr 04, 2000, IV Toradol was given prior to giving hip spica cast. Postoperative cast x-rays revealed a good position.  The hip was reduced.  The plan was to have physical therapy ambulate the patient.  On Apr 05, 2000, postoperative day #6/#3, the patient was walking, up out of bed with spica cast on.  He also complained of the cast being too tight, and he has been also ______ , and says that the spica cast also compresses his chest with deep and long inspirations.  He had one bowel movement on this day.  Vital signs on this day were maximum temperature 98.6, 86 was the pulse, 24 was respirations, and BP was 128/71.  Hemoglobin was 8.5, hematocrit was 26.5.  The plan was to have the orthopedic tech trim and place a Webril stuffing around the trimmed cast.  On Apr 05, 2000, postoperative day #7/#4, the plan was for discharge to home when the patient felt ready.  On Apr 06, 2000, his PT/INR was 18.9 and INR was 2.1.  On Apr 07, 2000, postoperative day #8/#5, the  patient complained of being "worn out from physical therapy, walking with crutches is difficult." He has been walking, bearing 50% of his weight on his left  foot.  He questions for rehabilitation placement.  PT noted on Apr 07, 2000, that he was having difficulty gaining out of bed mobility, and he has been often complaining of daily increasing pain.  Consultation was done for physical medicine rehabilitation services, and they considered progress was very good at this time.  Will speak with case manager, risk management, etc., regarding a possible short stay versus home health physical therapy.  On April 08, 2000, postoperative day #9/#6, hemoglobin was 8.4.  The patient and his wife said he is not ready to go home.  She is working full-time, and he would be home alone.  This is a concern for him.  The patient would need someone to be home for assistance with him, and this was discussed with his wife.  Dr. Lorin Mercy did review on this day also his hip x-rays with his wife.  He discussed the daily treatment and the daily modalities of using the spica cast.  He also discussed the possibility of revision of the hip if he would have further instability after spica cast was removed.  At this time, INR was therapeutic.  Maximum temperature was 97.9.  He was also prophylactically placed on Keflex 500 mg b.i.d. for drainage noted from the drain from the incision site.  On ______ his PT was 19.8, and his INR was 2.3.  His hemoglobin was 8.3 and hematocrit of 26.5.  Also on this day, rehabilitation management spoke with the insurance carrier, [employer], and says insurance carrier thought the patient was going home yesterday.  Spoke with patient and wife and informed them that insurance would not cover this rehabilitation stay, and they did not want to finance this bill.  The recommended home physical therapy and follow-up.  Physical therapy also on this day said that if the patient  had accepted, will continue home physical therapy, and the decision was made to discharge the patient this day.  CONDITION ON DISCHARGE:  Good.  DISPOSITION:  Discharged to home, with home health physical therapy.  MEDICATIONS:  Vicodin #30, 1 tablet p.o. q.4-6h. severe pain, with daily PT/INRs.  FOLLOW-UP:  He will return to the office in one week for follow-up care at [facility] at [address] [place], [place]. Telephone number was given of [phone].   WOUND CARE:  Wound care instructions were given, proper dressing changes were also given to his family.  DIET:  He is not restricted for diet.  ACTIVITY:  Use crutches with the spica cast. DD:  04/28/00 TD:  04/30/00 Job: [id number]

## 2011-03-26 NOTE — Discharge Summary (Signed)
Paul [name], DEMEO NO.:  [id number]   MEDICAL RECORD NO.:  [id number]          PATIENT TYPE:  INP   LOCATION:  2017                         FACILITY:  [facility]   PHYSICIAN:  Helane Rima, MD     DATE OF BIRTH:  1961/05/19   DATE OF ADMISSION:  12/06/2004  DATE OF DISCHARGE:                                 DISCHARGE SUMMARY   DISCHARGE DIAGNOSES:  1.  Pre-syncope, rule out myocardial infarction.  2.  Chronic leg pain.  3.  Renal insufficiency.  4.  Anemia.   DISCHARGE MEDICATIONS:  1.  Percocet 5/325 mg one to two pills q.4 to 6h p.r.n.  2.  Ferrous sulfate 325 mg one p.o. daily.   SPECIAL INSTRUCTIONS:  1.  The patient is not to take any non-steroidal anti-inflammatories such as      Advil, ibuprofen, Motrin or Aleve.   FOLLOW UP:  1.  Follow-up with Dr. Judi Cong at [facility] family practice on      December 21, 2004 at 2:45 p.m.  2.  Outpatient follow-up with his orthopedic physician for his leg pain.  3.  The patient needs a complete annual physical examination at his follow-      up appointment with Dr. Judi Cong including a workup for anemia.   HOSPITAL COURSE:  This is a 50 year old African-American male who came in  for an episode of pre-syncope while at church and standing for prolonged  periods of time. He was worked up for the following while in the hospital.  1.  PRE-SYNCOPE.  The patient was admitted to rule out MI.  The patient      denied any chest pain with his episode of lightheadedness that was also      accompanied by sweating and a feeling of tunnel vision.  Once the      patient arrived at the hospital, all symptoms had resolved.  He was      worked up for a [place].  An EKG showed some T wave abnormalities with      elevations in leads V1 through V4 and some T wave inversions in leads V5      through V6 which were unchanged since March 2004.  His CK MB levels were      1.7 and 1.5 on the first two readings which are normal. He  additionally      had normal Troponin I levels and myoglobin.  Orthostatic blood pressures      were noncontributory.  The patient was admitted for a 23-hour      observation.  His cardiac enzymes were negative times three.  He was on      telemetry which showed no acute events overnight.  This episode was      likely thought secondary to dehydration.  However, the patient did have      a murmur which seemed to not be a new finding based on previous      dictations.  Additionally, he had no more episodes while in the      hospital.  He did undergo an echo during  his stay.  The results were not      available at the time of discharge.  As an outpatient, he would likely      benefit from cardiac stress testing.  2.  CHRONIC LEG PAIN.  The patient has a history of chronic leg pain      secondary to joint disease since he was a teenager for which he has been      chronically taking pain medications.  He is followed by an orthopedist      for this and no changes were made in the hospital as far as his leg goes      other than changing his method of analgesia to something other than non-      steroidal anti-inflammatories secondary to poor renal function as      discussed below.  At discharge, the patient was given a prescription for      Percocet 5/325 mg one to two pills q.4 to 6h p.r.n. with recommendations      to follow-up with his orthopedic physician.  3.  RENAL INSUFFICIENCY.  The patient on admission had a creatinine of 2.1      as well as some proteinuria which was thought initially to be secondary      to dehydration.  However, on the day after admission with some IV      rehydration the patient still had a creatinine of 1.9.  This is thought      to be secondary to chronic use of non-steroidal anti-inflammatories.      The patient was instructed to no longer use non-steroidal anti-      inflammatories as he had admitted to using approximately 1000 to 1200 mg      of ibuprofen at one  time, sometimes as many as three times a day.  The      patient was given Percocet in the interim as described above with      recommendations for follow-up of his renal disease with his primary care      physician, Dr. Judi Cong, at [facility] family practice.  4.  ANEMIA.  The patient was found to have a hemoglobin of 10.9 while in the      hospital.  Iron studies were obtained and were pending at the time of      discharge.  He had a low MCV of approximately 77.0.  He is discharged      with ferrous sulfate 325 mg one p.o. daily and will need follow-up as an      outpatient.      TH/MEDQ  D:  12/07/2004  T:  12/07/2004  Job:  [id number]   cc:   Manus Rudd, MD  Fax: [phone]

## 2011-05-13 ENCOUNTER — Other Ambulatory Visit: Payer: Self-pay | Admitting: Sports Medicine

## 2011-08-03 LAB — DIFFERENTIAL
Eosinophils Relative: 2
Lymphocytes Relative: 28
Neutro Abs: 5.5
Neutrophils Relative %: 64

## 2011-08-03 LAB — POCT I-STAT, CHEM 8
Calcium, Ion: 1.21
Chloride: 106
Glucose, Bld: 85
HCT: 43
Hemoglobin: 14.6
TCO2: 27

## 2011-08-03 LAB — BASIC METABOLIC PANEL
BUN: 20
Chloride: 105
Creatinine, Ser: 1.68 — ABNORMAL HIGH
GFR calc Af Amer: 53 — ABNORMAL LOW
GFR calc non Af Amer: 44 — ABNORMAL LOW
Potassium: 3.9

## 2011-08-03 LAB — CBC
MCV: 79.8
RDW: 13.2
WBC: 8.7

## 2011-08-03 LAB — POCT CARDIAC MARKERS
CKMB, poc: 2.7
Myoglobin, poc: 81.6
Operator id: 196461
Troponin i, poc: <0.05
Troponin i, poc: <0.05

## 2011-08-03 LAB — CARDIAC PANEL(CRET KIN+CKTOT+MB+TROPI)
Relative Index: 2.5
Total CK: 138
Troponin I: 0.02

## 2011-08-03 LAB — TSH: TSH: 0.268 — ABNORMAL LOW

## 2011-10-04 ENCOUNTER — Encounter: Payer: Self-pay | Admitting: Family Medicine

## 2011-10-04 ENCOUNTER — Telehealth: Payer: Self-pay | Admitting: Family Medicine

## 2011-10-04 ENCOUNTER — Ambulatory Visit (INDEPENDENT_AMBULATORY_CARE_PROVIDER_SITE_OTHER): Payer: Medicare Other | Admitting: Family Medicine

## 2011-10-04 DIAGNOSIS — I10 Essential (primary) hypertension: Secondary | ICD-10-CM

## 2011-10-04 DIAGNOSIS — J069 Acute upper respiratory infection, unspecified: Secondary | ICD-10-CM | POA: Insufficient documentation

## 2011-10-04 DIAGNOSIS — R05 Cough: Secondary | ICD-10-CM | POA: Insufficient documentation

## 2011-10-04 MED ORDER — BENZONATATE 100 MG PO CAPS: 100.0000 mg | ORAL_CAPSULE | Freq: Four times a day (QID) | ORAL | Status: DC | PRN

## 2011-10-04 MED ORDER — FLUTICASONE PROPIONATE 50 MCG/ACT NA SUSP: 1.0000 | Freq: Two times a day (BID) | NASAL | Status: DC

## 2011-10-04 MED ORDER — HYDROCODONE-HOMATROPINE 5-1.5 MG/5ML PO SYRP: 5.0000 mL | ORAL_SOLUTION | Freq: Four times a day (QID) | ORAL | Status: AC | PRN

## 2011-10-04 MED ORDER — MONTELUKAST SODIUM 10 MG PO TABS: 10.0000 mg | ORAL_TABLET | Freq: Every day | ORAL | Status: DC

## 2011-10-04 MED ORDER — CETIRIZINE HCL 5 MG PO TABS: 10.0000 mg | ORAL_TABLET | Freq: Every day | ORAL | Status: DC

## 2011-10-04 MED ORDER — AZITHROMYCIN 250 MG PO TABS: ORAL_TABLET | ORAL | Status: AC

## 2011-10-04 NOTE — Assessment & Plan Note (Signed)
Initially elevated, repeat much better. He is to make appt with PCP for FU.

## 2011-10-04 NOTE — Assessment & Plan Note (Signed)
Hycodan for relief.

## 2011-10-04 NOTE — Progress Notes (Signed)
  Subjective:    Patient ID: Paul [name], male    DOB: December 31, 1960, 49 y.o.   MRN: [id number]  HPI  1.  Cough and nasal congestion x 1 month:  Patient with PMH for allergic rhinitis, which has been present over the summer but worsening since season's change.  About 1 month ago noticed dramatic change in symptoms, much worsening nasal congestion and sinus pain.  Developed cough about the same time which has persisted to today.  No objective temperatures, does feel hot and then c/o chills.  Taking multitude of OTC decongestants and allergic meds, cannot remember name of any.  Cough productive of thick green sputum.  Continues to smoke.    Review of Systems See HPI above for review of systems.       Objective:   Physical Exam  Gen:  AAM in no acute distress, but does appear to be feeling bad. Head:  [place]/AT.  Eyes:  EOMI, PERRL, conjunctiva clear Nose:  Nasal turbinates swollen, erythematous, exudates noted.  Maxillary sinus tenderness to palpation Mouth:  MMM, tonsils not enlarged Neck:  No LAD Pulm:  Clear to auscultation bilaterally with good air movement.  No wheezes or rales noted.   Cardiac:  Regular rate and rhythm without murmur auscultated.  Good S1/S2.       Assessment & Plan:

## 2011-10-04 NOTE — Patient Instructions (Signed)
I have sent in an antibiotic for you today - take 2 today and 1 pill after that for the next 4 days. Take the cough syrup every 6 hours and before bed.  For your nasal congestion, stop taking all the over the counter medicines. Take the Cetirizine pill for allergies. Take the Fluticasone nasal spray for your nasal congestion.  It was good to meet you today.

## 2011-10-04 NOTE — Assessment & Plan Note (Signed)
Present 1 month, treat with Azithro x 5 days. To resume allergic treatments and stop all OTC meds to prevent med rebound.

## 2011-10-04 NOTE — Telephone Encounter (Signed)
Pt seen today, 2 rxs not covered by medicare, needs alternate rx for hycodan and zyrtec. Pt goes to [facility]

## 2011-10-19 ENCOUNTER — Encounter: Payer: Medicare Other | Admitting: Family Medicine

## 2011-10-21 ENCOUNTER — Other Ambulatory Visit: Payer: Self-pay | Admitting: Family Medicine

## 2011-10-21 NOTE — Telephone Encounter (Signed)
Refill request

## 2011-11-12 ENCOUNTER — Ambulatory Visit: Payer: Medicare Other | Admitting: Family Medicine

## 2011-12-03 ENCOUNTER — Other Ambulatory Visit: Payer: Self-pay | Admitting: Family Medicine

## 2011-12-03 NOTE — Telephone Encounter (Signed)
Refill request

## 2011-12-10 ENCOUNTER — Ambulatory Visit (INDEPENDENT_AMBULATORY_CARE_PROVIDER_SITE_OTHER): Payer: Medicare Other | Admitting: Family Medicine

## 2011-12-10 ENCOUNTER — Encounter: Payer: Self-pay | Admitting: Family Medicine

## 2011-12-10 VITALS — BP 130/78 | HR 82 | Temp 97.9°F | Ht 71.0 in | Wt 185.4 lb

## 2011-12-10 DIAGNOSIS — R109 Unspecified abdominal pain: Secondary | ICD-10-CM

## 2011-12-10 DIAGNOSIS — R1013 Epigastric pain: Secondary | ICD-10-CM

## 2011-12-10 DIAGNOSIS — Z23 Encounter for immunization: Secondary | ICD-10-CM

## 2011-12-10 LAB — POCT H PYLORI SCREEN: H Pylori Screen, POC: NEGATIVE

## 2011-12-10 MED ORDER — OMEPRAZOLE 20 MG PO CPDR: 20.0000 mg | DELAYED_RELEASE_CAPSULE | Freq: Every day | ORAL | Status: DC

## 2011-12-10 MED ORDER — MELOXICAM 15 MG PO TABS: 15.0000 mg | ORAL_TABLET | Freq: Every day | ORAL | Status: DC

## 2011-12-10 NOTE — Patient Instructions (Signed)
Today we talked about your stomach pain. I am sending you for some blood work today. I would like you to start taking Prilosec. We are also sending you to the GI doctor. I would like you to come back in 2-3 weeks so we can make sure that you've gotten your appointments together and the medication is helping.  We can talk about your blood pressure medicine in 2 weeks.

## 2011-12-10 NOTE — Assessment & Plan Note (Signed)
Patient here today for stomach pain after alcohol ingestion. Also noting symptoms of acid reflux. Concern for alcoholic gastritis if patient is drinking more than he states he is. Patient is losing weight although he is not sure if this is due to his cutting her portions or loss of appetite. No lymphadenopathy, no fatigue. No changes in bowel movements. Will send for GI evaluation. Patient is also due for colonoscopy. Will check for H. pylori and check CMET today. Plan to start omeprazole for symptoms. Advised avoidance of alcohol.

## 2011-12-10 NOTE — Progress Notes (Signed)
  Subjective:    Patient ID: Paul [name], male    DOB: [birth year], 51 y.o.   MRN: [id number]  HPI Abdominal pain-patient states that he has noticed that he will wake up with stomach pain if he drinks a beer at night. He'll also get this pain if he eats late at night. This has been going on for several years. He states that his drinking is limited to a 16 ounce beer, 1-2 bottles, once per week. He was a heavy drinker when he was younger but he never had a problem with alcohol. He states that about twice per month he will have 2-3 drinks containing liquor in social situations. These will also give him belly pain. He says he has never had vomiting with this, he has no bowel changes, he has never had blood in his stool and denies black tarry stools. He states that the pain is a burning as well as sharp. It is in the epigastric area. He has noticed some metallic or acid taste in his mouth in the morning as well. He states that the symptoms last about an hour and will go away when he gets moving in the morning. He has noticed slightly less appetite but is able to eat well.  Not a smoker. Review of Systems Denies fevers, notes weight loss but he is trying to decrease his portions in addition to noticing less appetite.    Objective:   Physical Exam Vital signs reviewed General appearance - alert, well appearing, and in no distress and oriented to person, place, and time Heart - normal rate, regular rhythm, normal S1, S2, no murmurs, rubs, clicks or gallops Abdomin-soft, mild tenderness over epigastrium, bowel sounds present, no masses No cervical lymphadenopathy, no supraclavicular lymphadenopathy       Assessment & Plan:

## 2011-12-11 LAB — COMPREHENSIVE METABOLIC PANEL
Alkaline Phosphatase: 75 U/L (ref 39–117)
CO2: 26 mEq/L (ref 19–32)
Creat: 1.59 mg/dL — ABNORMAL HIGH (ref 0.50–1.35)
Glucose, Bld: 100 mg/dL — ABNORMAL HIGH (ref 70–99)
Total Bilirubin: 1.7 mg/dL — ABNORMAL HIGH (ref 0.3–1.2)

## 2011-12-13 ENCOUNTER — Telehealth: Payer: Self-pay | Admitting: *Deleted

## 2011-12-13 NOTE — Telephone Encounter (Signed)
lvm informing pt of GI appt with Dr. Benson Norway on 02.13.2013 @ 930 AM. Their office is located at [facility] [address] phone: [phone]. Advised pt that if he cannot keep this appt he will need to call their office 24 hours in advance to avoid a possible late cancellation fee.  Pt's OV notes faxed to [phone].Audelia Hives [place]

## 2011-12-31 ENCOUNTER — Ambulatory Visit: Payer: Medicare Other | Admitting: Family Medicine

## 2011-12-31 ENCOUNTER — Other Ambulatory Visit: Payer: Self-pay | Admitting: *Deleted

## 2011-12-31 NOTE — Telephone Encounter (Signed)
Message left on office voicemail.  Patient requesting refill of Viagra---[facility] ([address]  Nolene Ebbs, RN

## 2012-01-04 MED ORDER — SILDENAFIL CITRATE 100 MG PO TABS: 100.0000 mg | ORAL_TABLET | ORAL | Status: DC | PRN

## 2012-01-04 NOTE — Telephone Encounter (Signed)
Sent!

## 2012-01-04 NOTE — Telephone Encounter (Signed)
Returned call to patient.  Left message to check with pharmacy for med.  Nolene Ebbs, RN

## 2012-04-22 ENCOUNTER — Other Ambulatory Visit: Payer: Self-pay | Admitting: Family Medicine

## 2012-09-01 ENCOUNTER — Telehealth: Payer: Self-pay | Admitting: *Deleted

## 2012-09-01 NOTE — Telephone Encounter (Signed)
Called pt and advised that we cannot refill his med as he has not been seen here since 08/11/2010.  Offered to schedule him for an appt next week.  Scheduled pt for appt 09/06/12 with Dr. Micheline Chapman.

## 2012-09-01 NOTE — Telephone Encounter (Signed)
Message copied by Ocie Bob on Fri Sep 01, 2012  2:08 PM ------      Message from: Carolyne Littles      Created: Fri Sep 01, 2012 12:15 PM      Regarding: refill request      Contact: [phone]       Please refill Meloxicam       Pharmacy is [facility] [address]

## 2012-09-05 ENCOUNTER — Ambulatory Visit (INDEPENDENT_AMBULATORY_CARE_PROVIDER_SITE_OTHER): Payer: Medicare Other | Admitting: Sports Medicine

## 2012-09-05 VITALS — BP 120/70 | Ht 71.0 in | Wt 180.0 lb

## 2012-09-05 DIAGNOSIS — M169 Osteoarthritis of hip, unspecified: Secondary | ICD-10-CM

## 2012-09-05 DIAGNOSIS — M25559 Pain in unspecified hip: Secondary | ICD-10-CM

## 2012-09-05 DIAGNOSIS — M25551 Pain in right hip: Secondary | ICD-10-CM | POA: Insufficient documentation

## 2012-09-05 DIAGNOSIS — M25552 Pain in left hip: Secondary | ICD-10-CM

## 2012-09-05 HISTORY — DX: Pain in right hip: M25.551

## 2012-09-05 MED ORDER — TRAMADOL HCL 50 MG PO TABS: 50.0000 mg | ORAL_TABLET | Freq: Four times a day (QID) | ORAL | Status: DC | PRN

## 2012-09-05 NOTE — Progress Notes (Signed)
  Subjective:    Patient ID: Paul [name], male    DOB: 08-28-1961, 51 y.o.   MRN: [id number]  HPI Paul [name] is here today for left hip pain.  This is long standing, but has been getting worse over the last year.  Has very limited ROM in his left hip and it is hard to bear weight on the left due to pain.  Also has weakness and decreased ROM in the right hip, but worse on the left.  Has done PT in the past but not for several years.  Has been on percocet, which helped, but not for the last several years.  Used to take ibuprofen $RemoveBefore'800mg'OOxcYdyRRKoQv$ , but it caused some kidney damage.  Also had injections in the past which helped, but again this was several years ago.  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  Revision in 2001 that was then revised in 2003 by Dr. Margo Aye at [facility].  Left hip had a screw placed at about age 62 for the same issue.  Of note, his son had to have a pin placed in his hip about 2 years ago.   Review of Systems     Objective:   Physical Exam Gen: alert, cooperative Left Hip: ROM IR: 2 Deg, ER: 3 Deg, Flexion: 90 Deg Decreased strength in hip flexors, 5/5 in knee extension, plantar and dorsi-flexion  Right Hip: ROM IR: 2 Deg, ER: 3 Deg, Flexion: 90 Deg Decreased strength in hip flexors, 4/5 in knee extension, 5/5 in plantar and dorsi-flexion     Assessment & Plan:

## 2012-09-05 NOTE — Patient Instructions (Addendum)
Come back on Friday at 11am for a hip injection. A prescription for Ultram was sent to your pharmacy.  This is a pain medicine that is safe to take with your kidneys. I recommend setting up an appointment with Dr. Margo Aye to discuss surgery for the left hip. We will also likely send you to physical therapy once the pain is improved.

## 2012-09-05 NOTE — Assessment & Plan Note (Addendum)
Left worse than right.  Associated with severely limited active and passive ROM.  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  Will have him come back on Friday at 11am for ultrasound guided hip injection.  Given Tramadol to take in the meantime.  Encouraged him to set up appt with Dr. Eli Hose at [facility] to discuss total hip replacement on the left for definitive treatment.  Will also likely need PT to help improve hip strength and ROM.

## 2012-09-06 ENCOUNTER — Ambulatory Visit: Payer: Medicare Other | Admitting: Sports Medicine

## 2012-09-08 ENCOUNTER — Other Ambulatory Visit: Payer: Medicare HMO | Admitting: Sports Medicine

## 2012-09-13 ENCOUNTER — Ambulatory Visit (INDEPENDENT_AMBULATORY_CARE_PROVIDER_SITE_OTHER): Payer: Medicare Other | Admitting: Family Medicine

## 2012-09-13 DIAGNOSIS — M25559 Pain in unspecified hip: Secondary | ICD-10-CM

## 2012-09-13 DIAGNOSIS — M25551 Pain in right hip: Secondary | ICD-10-CM

## 2012-09-13 MED ORDER — FLUTICASONE PROPIONATE 50 MCG/ACT NA SUSP: 1.0000 | Freq: Every day | NASAL | Status: DC

## 2012-09-13 NOTE — Patient Instructions (Addendum)
Thank you for coming in today. Call or go to the ER if you develop a large red swollen joint with extreme pain or oozing puss.  We will call you in a week or so.  We will set you up for PT.  We will set you up with an appointment with Dr  Wynelle Link. Come back as needed.    Please contact Monica at [facility] at [phone] to discuss your account. They will schedule you for an appointment once this has been discussed.

## 2012-09-14 NOTE — Assessment & Plan Note (Signed)
Left hip femoral acetabular diagnostic and therapeutic injection performed today. Schedule patient for physical therapy and followup with his orthopedist at [facility].

## 2012-09-14 NOTE — Progress Notes (Signed)
Paul [name] is a 51 y.o. male who presents to [facility] today for scheduled left hip ultrasound guided interarticular injection.    Feeling well no fevers chills erythema. Significant left groin and hip pain present. No radiating pain weakness or numbness   PMH reviewed. Status post right THR.  History  Substance Use Topics  . Smoking status: Passive Smoke Exposure - Never Smoker  . Smokeless tobacco: Not on file  . Alcohol Use: Not on file   ROS as above otherwise neg   Exam:  Gen: Well NAD SKIN: No erythema overlying the left hip  Muscle skeletal ultrasound of the left hip: Femoral acetabular joint identified and probe position marked.  Great vessels identified and marked medially.   Procedure note ultrasound guided injection of left hip Consent obtained and timeout performed.  Landmarks identified and probe position marked. As above.  The skin was then cleaned with alcohol and 5 mL of lidocaine without epinephrine were used to anesthetize the planned needle track.  We waited 5 minutes for full anesthetic effect to start.  The probe was sterilized and covered with a sterile Tegaderm.   The patient's skin was then sterilized with alcohol swabs.  Sterile jelly was used to again locate the femoral acetabular joint.  A 22-gauge spinal needle was used to access the femoral acetabular joint onder ultrasound guidance. A small amount of fluid was injected seen distended the capsule and a total of 80 mg of Depo-Medrol and 6 mL of Marcaine were injected into the hip joint under ultrasound guidance.  No bleeding patient noted significant relief of pain following the injection.

## 2012-09-20 ENCOUNTER — Other Ambulatory Visit: Payer: Medicare HMO | Admitting: Family Medicine

## 2012-09-22 ENCOUNTER — Emergency Department (HOSPITAL_COMMUNITY): Payer: Medicare HMO

## 2012-09-22 ENCOUNTER — Encounter (HOSPITAL_COMMUNITY): Payer: Self-pay | Admitting: Emergency Medicine

## 2012-09-22 ENCOUNTER — Inpatient Hospital Stay (HOSPITAL_COMMUNITY)
Admission: EM | Admit: 2012-09-22 | Discharge: 2012-10-04 | DRG: 339 | Disposition: A | Discharge status: Home Health | Payer: Medicare HMO | Attending: General Surgery | Admitting: General Surgery

## 2012-09-22 ENCOUNTER — Encounter (HOSPITAL_COMMUNITY): Admission: EM | Disposition: A | Discharge status: Home Health | Payer: Self-pay | Source: Home / Self Care

## 2012-09-22 ENCOUNTER — Encounter (HOSPITAL_COMMUNITY): Payer: Self-pay | Admitting: Anesthesiology

## 2012-09-22 ENCOUNTER — Emergency Department (HOSPITAL_COMMUNITY): Payer: Medicare HMO | Admitting: Anesthesiology

## 2012-09-22 DIAGNOSIS — K352 Acute appendicitis with generalized peritonitis, without abscess: Secondary | ICD-10-CM

## 2012-09-22 DIAGNOSIS — E876 Hypokalemia: Secondary | ICD-10-CM | POA: Diagnosis not present

## 2012-09-22 DIAGNOSIS — I119 Hypertensive heart disease without heart failure: Secondary | ICD-10-CM | POA: Diagnosis present

## 2012-09-22 DIAGNOSIS — R9431 Abnormal electrocardiogram [ECG] [EKG]: Secondary | ICD-10-CM | POA: Diagnosis present

## 2012-09-22 DIAGNOSIS — R109 Unspecified abdominal pain: Secondary | ICD-10-CM

## 2012-09-22 DIAGNOSIS — K560 Paralytic ileus: Secondary | ICD-10-CM | POA: Diagnosis not present

## 2012-09-22 DIAGNOSIS — I480 Paroxysmal atrial fibrillation: Secondary | ICD-10-CM | POA: Diagnosis present

## 2012-09-22 DIAGNOSIS — I722 Aneurysm of renal artery: Secondary | ICD-10-CM | POA: Diagnosis present

## 2012-09-22 DIAGNOSIS — I4891 Unspecified atrial fibrillation: Secondary | ICD-10-CM | POA: Diagnosis present

## 2012-09-22 DIAGNOSIS — N289 Disorder of kidney and ureter, unspecified: Secondary | ICD-10-CM | POA: Diagnosis present

## 2012-09-22 DIAGNOSIS — K35209 Acute appendicitis with generalized peritonitis, without abscess, unspecified as to perforation: Principal | ICD-10-CM | POA: Diagnosis present

## 2012-09-22 DIAGNOSIS — I43 Cardiomyopathy in diseases classified elsewhere: Secondary | ICD-10-CM | POA: Diagnosis present

## 2012-09-22 DIAGNOSIS — I10 Essential (primary) hypertension: Secondary | ICD-10-CM

## 2012-09-22 DIAGNOSIS — K37 Unspecified appendicitis: Secondary | ICD-10-CM

## 2012-09-22 DIAGNOSIS — R110 Nausea: Secondary | ICD-10-CM

## 2012-09-22 HISTORY — PX: LAPAROSCOPY: SHX197

## 2012-09-22 HISTORY — PX: LAPAROSCOPIC APPENDECTOMY: SHX408

## 2012-09-22 LAB — CBC WITH DIFFERENTIAL/PLATELET
Basophils Absolute: 0 10*3/uL (ref 0.0–0.1)
HCT: 33 % — ABNORMAL LOW (ref 39.0–52.0)
Hemoglobin: 10.9 g/dL — ABNORMAL LOW (ref 13.0–17.0)
Lymphocytes Relative: 14 % (ref 12–46)
Lymphs Abs: 1 10*3/uL (ref 0.7–4.0)
MCV: 75.9 fL — ABNORMAL LOW (ref 78.0–100.0)
Monocytes Absolute: 0.5 10*3/uL (ref 0.1–1.0)
Monocytes Relative: 7 % (ref 3–12)
Neutro Abs: 5.5 10*3/uL (ref 1.7–7.7)
WBC: 7.3 10*3/uL (ref 4.0–10.5)

## 2012-09-22 LAB — COMPREHENSIVE METABOLIC PANEL
AST: 19 U/L (ref 0–37)
BUN: 16 mg/dL (ref 6–23)
CO2: 25 mEq/L (ref 19–32)
Chloride: 104 mEq/L (ref 96–112)
Creatinine, Ser: 1.54 mg/dL — ABNORMAL HIGH (ref 0.50–1.35)
GFR calc Af Amer: 58 mL/min — ABNORMAL LOW (ref >90)
GFR calc non Af Amer: 50 mL/min — ABNORMAL LOW (ref >90)
Glucose, Bld: 126 mg/dL — ABNORMAL HIGH (ref 70–99)
Total Bilirubin: 1.4 mg/dL — ABNORMAL HIGH (ref 0.3–1.2)

## 2012-09-22 LAB — MRSA PCR SCREENING: MRSA by PCR: NEGATIVE

## 2012-09-22 LAB — TROPONIN I
Troponin I: <0.3 ng/mL (ref <0.30)
Troponin I: <0.3 ng/mL (ref <0.30)

## 2012-09-22 SURGERY — LAPAROSCOPY, DIAGNOSTIC
Anesthesia: General | Site: Abdomen | Wound class: Dirty or Infected

## 2012-09-22 MED ORDER — NEOSTIGMINE METHYLSULFATE 1 MG/ML IJ SOLN
INTRAMUSCULAR | Status: DC | PRN
  Administered 2012-09-22: 4 mg via INTRAVENOUS

## 2012-09-22 MED ORDER — HYDROMORPHONE HCL PF 2 MG/ML IJ SOLN
2.0000 mg | Freq: Once | INTRAMUSCULAR | Status: AC
  Administered 2012-09-22: 2 mg via INTRAVENOUS
  Filled 2012-09-22: qty 1

## 2012-09-22 MED ORDER — PROMETHAZINE HCL 25 MG/ML IJ SOLN: 6.2500 mg | INTRAMUSCULAR | Status: DC | PRN

## 2012-09-22 MED ORDER — HYDROMORPHONE HCL PF 1 MG/ML IJ SOLN
1.0000 mg | Freq: Once | INTRAMUSCULAR | Status: AC
  Administered 2012-09-22: 1 mg via INTRAVENOUS
  Filled 2012-09-22: qty 1

## 2012-09-22 MED ORDER — ONDANSETRON HCL 4 MG/2ML IJ SOLN
4.0000 mg | Freq: Once | INTRAMUSCULAR | Status: AC
  Administered 2012-09-22: 4 mg via INTRAVENOUS
  Filled 2012-09-22: qty 2

## 2012-09-22 MED ORDER — IOHEXOL 300 MG/ML  SOLN: 100.0000 mL | Freq: Once | INTRAMUSCULAR | Status: DC | PRN

## 2012-09-22 MED ORDER — LACTATED RINGERS IR SOLN
Status: DC | PRN
  Administered 2012-09-22: 3000 mL

## 2012-09-22 MED ORDER — SUCCINYLCHOLINE CHLORIDE 20 MG/ML IJ SOLN
INTRAMUSCULAR | Status: DC | PRN
  Administered 2012-09-22: 100 mg via INTRAVENOUS

## 2012-09-22 MED ORDER — SODIUM CHLORIDE 0.9 % IV SOLN
INTRAVENOUS | Status: AC
  Filled 2012-09-22: qty 1

## 2012-09-22 MED ORDER — SODIUM CHLORIDE 0.9 % IV SOLN
INTRAVENOUS | Status: DC | PRN
  Administered 2012-09-22: 16:00:00 via INTRAVENOUS

## 2012-09-22 MED ORDER — GLYCOPYRROLATE 0.2 MG/ML IJ SOLN
INTRAMUSCULAR | Status: DC | PRN
  Administered 2012-09-22: .7 mg via INTRAVENOUS

## 2012-09-22 MED ORDER — MIDAZOLAM HCL 5 MG/5ML IJ SOLN
INTRAMUSCULAR | Status: DC | PRN
  Administered 2012-09-22: 1 mg via INTRAVENOUS
  Administered 2012-09-22: 50 mg via INTRAVENOUS

## 2012-09-22 MED ORDER — LACTATED RINGERS IV SOLN
INTRAVENOUS | Status: DC | PRN
  Administered 2012-09-22 (×2): via INTRAVENOUS

## 2012-09-22 MED ORDER — HYDROMORPHONE HCL PF 1 MG/ML IJ SOLN
1.0000 mg | INTRAMUSCULAR | Status: DC | PRN
  Administered 2012-09-22 – 2012-09-27 (×46): 1 mg via INTRAVENOUS
  Filled 2012-09-22 (×46): qty 1

## 2012-09-22 MED ORDER — POTASSIUM CHLORIDE 10 MEQ/100ML IV SOLN
10.0000 meq | Freq: Once | INTRAVENOUS | Status: AC
  Administered 2012-09-22: 10 meq via INTRAVENOUS
  Filled 2012-09-22: qty 100

## 2012-09-22 MED ORDER — POTASSIUM CHLORIDE 10 MEQ/100ML IV SOLN
INTRAVENOUS | Status: DC | PRN
  Administered 2012-09-22: 10 meq via INTRAVENOUS

## 2012-09-22 MED ORDER — SODIUM CHLORIDE 0.9 % IV BOLUS (SEPSIS)
1000.0000 mL | Freq: Once | INTRAVENOUS | Status: AC
  Administered 2012-09-22: 1000 mL via INTRAVENOUS

## 2012-09-22 MED ORDER — ROCURONIUM BROMIDE 100 MG/10ML IV SOLN
INTRAVENOUS | Status: DC | PRN
  Administered 2012-09-22: 40 mg via INTRAVENOUS
  Administered 2012-09-22: 10 mg via INTRAVENOUS

## 2012-09-22 MED ORDER — BUPIVACAINE HCL (PF) 0.5 % IJ SOLN
INTRAMUSCULAR | Status: AC
  Filled 2012-09-22: qty 30

## 2012-09-22 MED ORDER — ENOXAPARIN SODIUM 40 MG/0.4ML ~~LOC~~ SOLN
40.0000 mg | SUBCUTANEOUS | Status: DC
  Administered 2012-09-23 – 2012-10-03 (×10): 40 mg via SUBCUTANEOUS
  Filled 2012-09-22 (×12): qty 0.4

## 2012-09-22 MED ORDER — ONDANSETRON HCL 4 MG/2ML IJ SOLN
4.0000 mg | Freq: Four times a day (QID) | INTRAMUSCULAR | Status: DC | PRN
  Administered 2012-09-24 – 2012-09-25 (×3): 4 mg via INTRAVENOUS
  Filled 2012-09-22 (×3): qty 2

## 2012-09-22 MED ORDER — METRONIDAZOLE IN NACL 5-0.79 MG/ML-% IV SOLN
500.0000 mg | Freq: Three times a day (TID) | INTRAVENOUS | Status: DC
  Administered 2012-09-22 – 2012-09-23 (×2): 500 mg via INTRAVENOUS
  Filled 2012-09-22 (×3): qty 100

## 2012-09-22 MED ORDER — OXYCODONE-ACETAMINOPHEN 5-325 MG PO TABS
1.0000 | ORAL_TABLET | ORAL | Status: DC | PRN
  Administered 2012-09-23 – 2012-09-28 (×6): 2 via ORAL
  Administered 2012-09-28: 1 via ORAL
  Administered 2012-09-28 – 2012-10-04 (×8): 2 via ORAL
  Filled 2012-09-22 (×16): qty 2

## 2012-09-22 MED ORDER — PROPOFOL 10 MG/ML IV BOLUS
INTRAVENOUS | Status: DC | PRN
  Administered 2012-09-22: 50 mg via INTRAVENOUS

## 2012-09-22 MED ORDER — SODIUM CHLORIDE 0.9 % IV SOLN
1.0000 g | Freq: Once | INTRAVENOUS | Status: AC
  Administered 2012-09-22 (×2): 1 g via INTRAVENOUS
  Filled 2012-09-22: qty 1

## 2012-09-22 MED ORDER — SODIUM CHLORIDE 0.9 % IV SOLN: 1.0000 g | INTRAVENOUS | Status: DC

## 2012-09-22 MED ORDER — HYDROMORPHONE HCL PF 1 MG/ML IJ SOLN: 0.2500 mg | INTRAMUSCULAR | Status: DC | PRN

## 2012-09-22 MED ORDER — POTASSIUM CHLORIDE IN NACL 20-0.9 MEQ/L-% IV SOLN
INTRAVENOUS | Status: DC
  Administered 2012-09-22: 20:00:00 via INTRAVENOUS
  Filled 2012-09-22 (×2): qty 1000

## 2012-09-22 MED ORDER — ONDANSETRON HCL 4 MG/2ML IJ SOLN
INTRAMUSCULAR | Status: DC | PRN
  Administered 2012-09-22: 4 mg via INTRAVENOUS

## 2012-09-22 MED ORDER — ONDANSETRON HCL 4 MG PO TABS: 4.0000 mg | ORAL_TABLET | Freq: Four times a day (QID) | ORAL | Status: DC | PRN

## 2012-09-22 MED ORDER — INFLUENZA VIRUS VACC SPLIT PF IM SUSP
0.5000 mL | INTRAMUSCULAR | Status: AC
  Administered 2012-09-23: 0.5 mL via INTRAMUSCULAR
  Filled 2012-09-22 (×2): qty 0.5

## 2012-09-22 MED ORDER — FENTANYL CITRATE 0.05 MG/ML IJ SOLN
INTRAMUSCULAR | Status: DC | PRN
  Administered 2012-09-22 (×5): 50 ug via INTRAVENOUS

## 2012-09-22 MED ORDER — BUPIVACAINE HCL 0.5 % IJ SOLN
INTRAMUSCULAR | Status: DC | PRN
  Administered 2012-09-22: 30 mL

## 2012-09-22 MED ORDER — FAMOTIDINE IN NACL 20-0.9 MG/50ML-% IV SOLN
20.0000 mg | Freq: Two times a day (BID) | INTRAVENOUS | Status: DC
  Administered 2012-09-22 – 2012-09-28 (×11): 20 mg via INTRAVENOUS
  Filled 2012-09-22 (×16): qty 50

## 2012-09-22 MED ORDER — PIPERACILLIN-TAZOBACTAM 3.375 G IVPB
3.3750 g | Freq: Three times a day (TID) | INTRAVENOUS | Status: DC
  Administered 2012-09-22 – 2012-10-03 (×31): 3.375 g via INTRAVENOUS
  Filled 2012-09-22 (×35): qty 50

## 2012-09-22 SURGICAL SUPPLY — 69 items
APPLIER CLIP 5 13 M/L LIGAMAX5 (MISCELLANEOUS)
APPLIER CLIP ROT 10 11.4 M/L (STAPLE)
BLADE EXTENDED COATED 6.5IN (ELECTRODE) ×4 IMPLANT
BLADE HEX COATED 2.75 (ELECTRODE) ×4 IMPLANT
BLADE SURG SZ10 CARB STEEL (BLADE) IMPLANT
CANISTER SUCTION 2500CC (MISCELLANEOUS) ×4 IMPLANT
CANNULA ENDOPATH XCEL 11M (ENDOMECHANICALS) IMPLANT
CHLORAPREP W/TINT 26ML (MISCELLANEOUS) ×4 IMPLANT
CLIP APPLIE 5 13 M/L LIGAMAX5 (MISCELLANEOUS) IMPLANT
CLIP APPLIE ROT 10 11.4 M/L (STAPLE) IMPLANT
CLOTH BEACON ORANGE TIMEOUT ST (SAFETY) ×4 IMPLANT
COVER MAYO STAND STRL (DRAPES) ×4 IMPLANT
DECANTER SPIKE VIAL GLASS SM (MISCELLANEOUS) IMPLANT
DERMABOND ADVANCED (GAUZE/BANDAGES/DRESSINGS)
DERMABOND ADVANCED .7 DNX12 (GAUZE/BANDAGES/DRESSINGS) IMPLANT
DRAIN CHANNEL 19F RND (DRAIN) ×4 IMPLANT
DRAPE LAPAROSCOPIC ABDOMINAL (DRAPES) ×4 IMPLANT
DRAPE WARM FLUID 44X44 (DRAPE) IMPLANT
ELECT REM PT RETURN 9FT ADLT (ELECTROSURGICAL) ×4
ELECTRODE REM PT RTRN 9FT ADLT (ELECTROSURGICAL) ×3 IMPLANT
EVACUATOR SILICONE 100CC (DRAIN) ×4 IMPLANT
FILTER SMOKE EVAC LAPAROSHD (FILTER) IMPLANT
GLOVE BIO SURGEON STRL SZ7 (GLOVE) ×4 IMPLANT
GLOVE BIOGEL M 7.0 STRL (GLOVE) ×4 IMPLANT
GLOVE BIOGEL PI IND STRL 7.0 (GLOVE) ×3 IMPLANT
GLOVE BIOGEL PI IND STRL 7.5 (GLOVE) ×3 IMPLANT
GLOVE BIOGEL PI INDICATOR 7.0 (GLOVE) ×1
GLOVE BIOGEL PI INDICATOR 7.5 (GLOVE) ×1
GLOVE SURG SIGNA 7.5 PF LTX (GLOVE) ×4 IMPLANT
GOWN PREVENTION PLUS LG XLONG (DISPOSABLE) IMPLANT
GOWN PREVENTION PLUS XLARGE (GOWN DISPOSABLE) ×4 IMPLANT
GOWN STRL NON-REIN LRG LVL3 (GOWN DISPOSABLE) IMPLANT
GOWN STRL REIN XL XLG (GOWN DISPOSABLE) ×12 IMPLANT
HAND ACTIVATED (MISCELLANEOUS) ×4 IMPLANT
KIT BASIN OR (CUSTOM PROCEDURE TRAY) ×4 IMPLANT
LIGASURE IMPACT 36 18CM CVD LR (INSTRUMENTS) IMPLANT
NS IRRIG 1000ML POUR BTL (IV SOLUTION) IMPLANT
PACK GENERAL/GYN (CUSTOM PROCEDURE TRAY) ×4 IMPLANT
PENCIL BUTTON HOLSTER BLD 10FT (ELECTRODE) ×4 IMPLANT
SCALPEL HARMONIC ACE (MISCELLANEOUS) ×4 IMPLANT
SET IRRIG TUBING LAPAROSCOPIC (IRRIGATION / IRRIGATOR) ×4 IMPLANT
SOLUTION ANTI FOG 6CC (MISCELLANEOUS) ×4 IMPLANT
SPONGE GAUZE 4X4 12PLY (GAUZE/BANDAGES/DRESSINGS) IMPLANT
SPONGE LAP 18X18 X RAY DECT (DISPOSABLE) ×4 IMPLANT
STAPLER VISISTAT 35W (STAPLE) ×4 IMPLANT
STRIP CLOSURE SKIN 1/2X4 (GAUZE/BANDAGES/DRESSINGS) ×4 IMPLANT
SUCTION POOLE TIP (SUCTIONS) ×4 IMPLANT
SUT ETHILON 2 0 PS N (SUTURE) ×4 IMPLANT
SUT PDS AB 1 TP1 96 (SUTURE) IMPLANT
SUT PROLENE 2 0 KS (SUTURE) IMPLANT
SUT PROLENE 2 0 SH DA (SUTURE) IMPLANT
SUT SILK 2 0 (SUTURE) ×1
SUT SILK 2 0 SH CR/8 (SUTURE) ×4 IMPLANT
SUT SILK 2-0 18XBRD TIE 12 (SUTURE) ×3 IMPLANT
SUT SILK 3 0 (SUTURE) ×1
SUT SILK 3 0 SH CR/8 (SUTURE) ×4 IMPLANT
SUT SILK 3-0 18XBRD TIE 12 (SUTURE) ×3 IMPLANT
SYS LAPSCP GELPORT 120MM (MISCELLANEOUS)
SYSTEM LAPSCP GELPORT 120MM (MISCELLANEOUS) IMPLANT
TOWEL OR 17X26 10 PK STRL BLUE (TOWEL DISPOSABLE) ×8 IMPLANT
TRAY FOLEY CATH 14FRSI W/METER (CATHETERS) ×4 IMPLANT
TRAY LAP CHOLE (CUSTOM PROCEDURE TRAY) ×4 IMPLANT
TROCAR BLADELESS OPT 5 75 (ENDOMECHANICALS) ×8 IMPLANT
TROCAR XCEL 12X100 BLDLESS (ENDOMECHANICALS) IMPLANT
TROCAR XCEL BLUNT TIP 100MML (ENDOMECHANICALS) ×4 IMPLANT
TROCAR XCEL NON-BLD 11X100MML (ENDOMECHANICALS) IMPLANT
TUBING INSUFFLATION 10FT LAP (TUBING) ×8 IMPLANT
YANKAUER SUCT BULB TIP 10FT TU (MISCELLANEOUS) ×4 IMPLANT
YANKAUER SUCT BULB TIP NO VENT (SUCTIONS) ×4 IMPLANT

## 2012-09-22 NOTE — ED Notes (Signed)
EZM:[id number]<TM> Expected date:09/22/12<BR> Expected time: 9:39 AM<BR> Means of arrival:Ambulance<BR> Comments:<BR> Left upper quad pain

## 2012-09-22 NOTE — ED Provider Notes (Signed)
Paul [name] is a 51 y.o. male with abdominal pain for 2 days. Persistant and worsening. Abdomen is tense. Decreased bowel sounds. Moderate diffuse tenderness.  CT ordered to different source of pain.    Date: 08/25/2012  Rate: 70  Rhythm: normal sinus rhythm  QRS Axis: normal  PR and QT Intervals: normal  ST/T Wave abnormalities: nonspecific T wave changes  PR and QRS Conduction Disutrbances:none  Narrative Interpretation:       Old EKG Reviewed: none available     Medical screening examination/treatment/procedure(s) were conducted as a shared visit with non-physician practitioner(s) and myself.  I personally evaluated the patient during the encounter   Richarda Blade, MD 09/22/12 [phone]

## 2012-09-22 NOTE — ED Provider Notes (Signed)
History     CSN: [id number]  Arrival date & time 09/22/12  1006   First MD Initiated Contact with Patient 09/22/12 1020      Chief Complaint  Patient presents with  . Abdominal Pain  . Nausea    (Consider location/radiation/quality/duration/timing/severity/associated sxs/prior treatment) HPI  Paul [name] is a 51 y.o. male complaining of diffuse abdominal pain x2days. Denies change in bowle or bladder habits or N/V. Pt has been eating with mildly reduced PO intake. Pt endorses subjective fever and chills. Pain is 10/10 worsening from 7/10 at onset, constant with exacerbations with movement. No h/o abdominal surgeries.   PCP: [facility]  Past Medical History  Diagnosis Date  . Hypertensive cardiomyopathy   . Hypertension   . Chronic kidney disease   . Atrial fibrillation     Isolated episode    Past Surgical History  Procedure Date  . Hip surgery   . Total hip arthroplasty     x2    History reviewed. No pertinent family history.  History  Substance Use Topics  . Smoking status: Passive Smoke Exposure - Never Smoker  . Smokeless tobacco: Not on file  . Alcohol Use: Not on file      Review of Systems  Constitutional: Negative for fever.  Respiratory: Negative for shortness of breath.   Cardiovascular: Negative for chest pain.  Gastrointestinal: Positive for nausea, abdominal pain and diarrhea. Negative for vomiting.  All other systems reviewed and are negative.    Allergies  Ibuprofen and Nsaids  Home Medications   Current Outpatient Rx  Name  Route  Sig  Dispense  Refill  . BENZONATATE 100 MG PO CAPS   Oral   Take 1 capsule (100 mg total) by mouth every 6 (six) hours as needed for cough.   30 capsule   0   . CETIRIZINE HCL 5 MG PO TABS   Oral   Take 2 tablets (10 mg total) by mouth daily.   31 tablet   3   . FLUTICASONE PROPIONATE 50 MCG/ACT NA SUSP   Nasal   Place 1 spray into the nose daily.   16 g   12   . MELOXICAM 15 MG PO TABS  Oral   Take 1 tablet (15 mg total) by mouth daily.   30 tablet   2   . MONTELUKAST SODIUM 10 MG PO TABS      TAKE 1 TABLET BY MOUTH AT BEDTIME   30 tablet   1   . OMEPRAZOLE 20 MG PO CPDR   Oral   Take 1 capsule (20 mg total) by mouth daily.   30 capsule   11   . QUINAPRIL HCL 10 MG PO TABS   Oral   Take 10 mg by mouth daily.           Marland Kitchen RANITIDINE HCL 300 MG PO TABS   Oral   Take 300 mg by mouth at bedtime.           Marland Kitchen SILDENAFIL CITRATE 100 MG PO TABS   Oral   Take 1 tablet (100 mg total) by mouth as needed for erectile dysfunction. 1/2-1 tablet by mouth as needed.   10 tablet   11   . TRAMADOL HCL 50 MG PO TABS   Oral   Take 1 tablet (50 mg total) by mouth every 6 (six) hours as needed for pain.   30 tablet   2     BP 126/63  Pulse 71  Temp 97.5 F (36.4 C) (Oral)  Resp 18  SpO2 93%  Physical Exam  Nursing note and vitals reviewed. Constitutional: He is oriented to person, place, and time. He appears well-developed and well-nourished. No distress.       Eyes closed, appears in acute pain, lies very still.  HENT:  Head: Normocephalic.  Eyes: Conjunctivae normal and EOM are normal.  Cardiovascular: Normal rate.   Pulmonary/Chest: Effort normal. No stridor.  Abdominal:       Reduced bowel sounds, voluntary guarding, exquisite tenderness to palpation over McBurney's point: Patient screamed when palpated.  Musculoskeletal: Normal range of motion.  Neurological: He is alert and oriented to person, place, and time.  Psychiatric: He has a normal mood and affect.    ED Course  Procedures (including critical care time)  Labs Reviewed - No data to display No results found.   1. Appendicitis       MDM  Concern for appendicitis. I will make him n.p.o., hydrate aggressively control pain and her CT. Shared case with attending Dr. Eulis Foster.   Patient has mild hypokalemia at 3.3 I will supplement his potassium to 10 mEq by IV. He is a mild anemia at  10.9 which is below his baseline does not require intervention at this time.  Verbal report of critical result from radiologist Dr. 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   Invanz started and discussed with attending who will consult surgery.   No from general surgeon Dr. Ninfa Linden states that he will take the patient emergently into the OR for diagnostic laparotomy.       Monico Blitz, PA-C 09/22/12 1538

## 2012-09-22 NOTE — Op Note (Signed)
LAPAROSCOPY DIAGNOSTIC, APPENDECTOMY LAPAROSCOPIC  Procedure Note  Paul [name] 09/22/2012   Pre-op Diagnosis: possible perforated appendix possible perforated diverticulum     Post-op Diagnosis: perforated appendicitis  Procedure(s): LAPAROSCOPY DIAGNOSTIC APPENDECTOMY LAPAROSCOPIC  Surgeon(s): Harl Bowie, MD  Anesthesia: General  Staff:  Alvira Monday, RN - RN First Assistant Barbee Shropshire, RN - Circulator Traci Bing Neighbors, CST - Scrub Person Omelia Blackwater, CST - Relief Scrub  Estimated Blood Loss: Minimal               Specimens: appendix          [name]   Date: 09/22/2012  Time: 5:29 PM

## 2012-09-22 NOTE — ED Notes (Signed)
Pt had calimari 2 days ago, and began to have abd pain a few hours afterwards.  Pt describes pain as constant stabbing.  Has had nausea, but no vomiting.  Denies diarrhea.

## 2012-09-22 NOTE — ED Notes (Signed)
Patient is aware that a urine sample is needed. Urinal at bedside. Will follow up

## 2012-09-22 NOTE — Anesthesia Preprocedure Evaluation (Addendum)
Anesthesia Evaluation  Patient identified by MRN, date of birth, ID band Patient awake  General Assessment Comment:Past Medical History   Diagnosis  Date   .  Hypertensive cardiomyopathy     .  Hypertension     .  Chronic kidney disease     .  Atrial fibrillation         Isolated episode     Reviewed: Allergy & Precautions, H&P , NPO status , Patient's Chart, lab work & pertinent test results, reviewed documented beta blocker date and time   Airway Mallampati: II TM Distance: >3 FB Neck ROM: full    Dental No notable dental hx.    Pulmonary neg pulmonary ROS,  breath sounds clear to auscultation  Pulmonary exam normal       Cardiovascular Exercise Tolerance: Good hypertension, On Medications + dysrhythmias Atrial Fibrillation Rhythm:regular Rate:Normal  Denies chest pains. Denies SOB. Good exercise tolerance. ECG reviewed and suggestive of lateral ischemia.   Neuro/Psych PSYCHIATRIC DISORDERS negative neurological ROS  negative psych ROS   GI/Hepatic Neg liver ROS, GERD-  Medicated,  Endo/Other  negative endocrine ROS  Renal/GU Renal diseaseH/O chronic renal insufficiency  negative genitourinary   Musculoskeletal   Abdominal   Peds  Hematology negative hematology ROS (+)   Anesthesia Other Findings   Reproductive/Obstetrics negative OB ROS                          Anesthesia Physical Anesthesia Plan  ASA: III and emergent  Anesthesia Plan: General   Post-op Pain Management:    Induction:   Airway Management Planned: Oral ETT  Additional Equipment:   Intra-op Plan:   Post-operative Plan: Extubation in OR  Informed Consent: I have reviewed the patients History and Physical, chart, labs and discussed the procedure including the risks, benefits and alternatives for the proposed anesthesia with the patient or authorized representative who has indicated his/her understanding and  acceptance.   Dental Advisory Given  Plan Discussed with: CRNA  Anesthesia Plan Comments: (Discussed ECG with Dr. Ninfa Linden. Will send cardiac enzymes. Surgery is emergent.)       Anesthesia Quick Evaluation

## 2012-09-22 NOTE — Progress Notes (Signed)
His nurse called me saying that his tele monitor has been saying all afternoon "ST abnormality" and so f/u EKG was ordered.  It has some t wave abnormalities and so I asked the CCM physician to look at the EKG well. Some of the changes are old compared to 2009 and some are different so she recommended checking troponins and if abnormal, we will plan on calling cardiology.  Since he is currently asymptomatic and HR normal we will see what his troponins look like.

## 2012-09-22 NOTE — H&P (Signed)
I have seen and examined the patient and agree with the assessment and plans.  This gentleman has an acute abdomen from either a perforated appendicitis or perforated diverticulitis. He also has an incidental renal artery aneurysm. We will proceed to the operating room for emergent diagnostic laparotomy. Depending on the diagnosis, this may need to be converted to an open laparotomy. I discussed him the risks of the operation. I discussed with him that if this is appendicitis hopefully surgery can be completed laparoscopically. He may be converted to an open procedure. If this is perforated diverticulitis, he will need a partial colectomy and colostomy. I discussed the other risks of surgery which includes but is not limited to bleeding, infection, injury to surrounding structures, need for further surgery, cardiopulmonary issues, DVT, etc. Surgery is scheduled emergently  Rodolfo Notaro A. Ninfa Linden  MD, FACS

## 2012-09-22 NOTE — Transfer of Care (Signed)
Immediate Anesthesia Transfer of Care Note  Patient: Paul [name]  Procedure(s) Performed: Procedure(s) (LRB): LAPAROSCOPY DIAGNOSTIC (N/A) APPENDECTOMY LAPAROSCOPIC ()  Patient Location: PACU  Anesthesia Type: General  Level of Consciousness: sedated, patient cooperative and responds to stimulaton  Airway & Oxygen Therapy: Patient Spontanous Breathing and Patient connected to face mask oxgen  Post-op Assessment: Report given to PACU RN and Post -op Vital signs reviewed and stable  Post vital signs: Reviewed and stable  Complications: No apparent anesthesia complications

## 2012-09-22 NOTE — Anesthesia Postprocedure Evaluation (Signed)
  Anesthesia Post-op Note  Patient: SIDDIQ KALUZNY  Procedure(s) Performed: Procedure(s) (LRB): LAPAROSCOPY DIAGNOSTIC (N/A) APPENDECTOMY LAPAROSCOPIC ()  Patient Location: PACU  Anesthesia Type: General  Level of Consciousness: awake and alert   Airway and Oxygen Therapy: Patient Spontanous Breathing  Post-op Pain: mild  Post-op Assessment: Post-op Vital signs reviewed, Patient's Cardiovascular Status Stable, Respiratory Function Stable, Patent Airway and No signs of Nausea or vomiting  Post-op Vital Signs: stable  Complications: No apparent anesthesia complications.

## 2012-09-22 NOTE — ED Notes (Signed)
Patient is still unable to give urine sample at this time

## 2012-09-22 NOTE — ED Notes (Signed)
Pt complains of generalized abd pain worse over past 2 days.  Pt states he has had occasional episode of abd pain prior to 2 days ago.

## 2012-09-22 NOTE — H&P (Signed)
Chief Complaint: abdominal pain and nausea x 2 days     Paul [name] is an 51 y.o. male complaining of diffuse abdominal pain x2days. He denies change in bowel or bladder habits or N/V. Pt has been eating with mildly reduced PO intake. Pt endorses subjective fever and chills. Pain is 10/10 worsening from 7/10 at onset, constant with exacerbations with movement. No h/o abdominal surgeries.  We are asked to see the patient for surgical evaluation and intervention. CT done on admission to [facility]: IMPRESSION:  The study is positive for free fluid and free air. There is an  abnormal appearance of the appendix and the findings are highly  concerning for a perforated appendicitis. In addition, there are  small foci of extraluminal air along the left colon that could  represent acute diverticulitis in this area.  Abnormal appearance of both kidneys with diffuse cortical scarring  and abnormal enhancement. Findings could be related to previous  scarring but cannot exclude an inflammatory or infectious etiology  in the kidneys.  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 Recommend vascular surgery  consultation.  Indeterminate 1.1 cm low density structure in the right hepatic  lobe and question an adrenal nodule. These areas warrant  surveillance or further evaluation with MRI. Dr. Ninfa Linden examined patient in the ED and it is determined that the patient needs emergent exploratory laparotomy with possible appendectomy vs possible colon resection and colostomy based on clinical presentation and CT results. He has been started on IV abx [facility]) is being kept NPO given pain medication and IVF.  Dr. Ninfa Linden has spoken to the patient and has explained all general risks associated with the surgery including risk of infection, bleeding, or damage to other structures, and has  also discussed with the patient the possibility of a temporary colostomy. The patient has verbalized understanding of the risks and has elected to go forward with the operation.   Past Medical History  Diagnosis Date  . Hypertensive cardiomyopathy   . Hypertension   . Chronic kidney disease   . Atrial fibrillation     Isolated episode    Past Surgical History  Procedure Date  . Hip surgery   . Total hip arthroplasty     x2    History reviewed. No pertinent family history. Social History:  reports that he has been passively smoking.  He does not have any smokeless tobacco history on file. His alcohol and drug histories not on file.  Allergies:  Allergies  Allergen Reactions  . Ibuprofen   . Nsaids     REACTION: nsaid induced nephropathy     (Not in a hospital admission)  Results for orders placed during the hospital encounter of 09/22/12 (from the past 48 hour(s))  CBC WITH DIFFERENTIAL     Status: Abnormal   Collection Time   09/22/12 10:45 AM      Component Value Range Comment   WBC 7.3  4.0 - 10.5 K/uL    RBC 4.35  4.22 - 5.81 MIL/uL    Hemoglobin 10.9 (*) 13.0 - 17.0 g/dL    HCT 33.0 (*) 39.0 - 52.0 %    MCV 75.9 (*) 78.0 - 100.0 fL    MCH 25.1 (*) 26.0 - 34.0 pg    MCHC 33.0  30.0 - 36.0 g/dL    RDW 13.1  11.5 - 15.5 %  Platelets 162  150 - 400 K/uL PLATELET COUNT CONFIRMED BY SMEAR   Neutrophils Relative 79 (*) 43 - 77 %    Neutro Abs 5.5  1.7 - 7.7 K/uL    Lymphocytes Relative 14  12 - 46 %    Lymphs Abs 1.0  0.7 - 4.0 K/uL    Monocytes Relative 7  3 - 12 %    Monocytes Absolute 0.5  0.1 - 1.0 K/uL    Eosinophils Relative 0  0 - 5 %    Eosinophils Absolute 0.0  0.0 - 0.7 K/uL    Basophils Relative 0  0 - 1 %    Basophils Absolute 0.0  0.0 - 0.1 K/uL   COMPREHENSIVE METABOLIC PANEL     Status: Abnormal   Collection Time   09/22/12 10:45 AM      Component Value Range Comment   Sodium 139  135 - 145 mEq/L    Potassium 3.3 (*) 3.5 - 5.1 mEq/L     Chloride 104  96 - 112 mEq/L    CO2 25  19 - 32 mEq/L    Glucose, Bld 126 (*) 70 - 99 mg/dL    BUN 16  6 - 23 mg/dL    Creatinine, Ser 1.54 (*) 0.50 - 1.35 mg/dL    Calcium 9.2  8.4 - 10.5 mg/dL    Total Protein 6.8  6.0 - 8.3 g/dL    Albumin 3.8  3.5 - 5.2 g/dL    AST 19  0 - 37 U/L    ALT 15  0 - 53 U/L    Alkaline Phosphatase 72  39 - 117 U/L    Total Bilirubin 1.4 (*) 0.3 - 1.2 mg/dL    GFR calc non Af Amer 50 (*) >90 mL/min    GFR calc Af Amer 58 (*) >90 mL/min    Ct Abdomen Pelvis W Contrast  09/22/2012  *RADIOLOGY REPORT*  Clinical Data: Right lower quadrant pain.  CT ABDOMEN AND PELVIS WITH CONTRAST  Technique:  Multidetector CT imaging of the abdomen and pelvis was performed following the standard protocol during bolus administration of intravenous contrast.  Contrast:  100 ml Omnipaque-300  Comparison: 04/21/2005  Findings: There is volume loss in the lower lobes bilaterally. Heart appears to be large for size.  A trace amount of left pleural fluid or thickening.  There is also a small amount of anterior perihepatic ascites and a small focus of intraperitoneal air. There appears to be stranding and inflammatory changes along the splenic flexure of the colon.  There is also fluid in this area. There is a small amount of extraluminal gas along the left colon. These findings could be secondary to diverticulitis.  There is free fluid in the pelvis and the right lower quadrant.  The appendix is grossly abnormal and thickening.  High-density material within the appendix suggests appendicoliths.  The appendix measures up to 2 cm and there is surrounding fluid in this area. There is free air near the base of the appendix best seen on sequence #2, image 61.  There is an abnormal appearance of both kidneys.  The left kidney demonstrates areas of cortical scarring, particularly involving the left lower pole.  The delayed images demonstrate a striated enhancing pattern which could represent perfusion  abnormalities or infection.  There are scattered areas of cortical scarring throughout the right kidney and there is marked dilatation of the entire right renal artery.  The proximal right renal artery measures up to  1.8 cm.  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  All of the visualized right renal arteries are diffusely enlarged.  There is no evidence to suggest a right perinephric hematoma.  There is an indeterminate 1.1 cm low density structure in the posterior right hepatic lobe on image 13, sequence #2.  This is not suggestive for a simple cyst.  The portal venous system is patent. No gross abnormality to the spleen or pancreas.  There may be a 1.6 cm right adrenal nodule which is poorly characterized.  Limited evaluation of pelvis due to bilateral hip hardware.  The may be mild wall thickening of the urinary bladder.  There is a right hip replacement.  Severe left hip arthritis. There is a screw extending through the left femoral head and neck.  IMPRESSION: The study is positive for free fluid and free air.  There is an abnormal appearance of the appendix and the findings are highly concerning for a perforated appendicitis.  In addition, there are small foci of extraluminal air along the left colon that could represent acute diverticulitis in this area.  Abnormal appearance of both kidneys with diffuse cortical scarring and abnormal enhancement.  Findings could be related to previous scarring but cannot exclude an inflammatory or infectious etiology in the kidneys.  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  Recommend vascular surgery consultation.  Indeterminate 1.1 cm low density structure in the right hepatic lobe and question an adrenal nodule.  These areas warrant surveillance or further evaluation with MRI.  Critical  Value/emergent results were called by telephone at the time of interpretation on 09/22/2012 at 1:21 p.m. to [facility], who verbally acknowledged these results.   Original Report Authenticated By: Richarda Overlie, M.D.     Review of Systems  Constitutional: Negative.  Negative for fever, chills, weight loss, malaise/fatigue and diaphoresis.  HENT: Negative.   Eyes: Negative.   Respiratory: Negative.   Cardiovascular: Negative.   Gastrointestinal: Positive for abdominal pain. Negative for nausea, vomiting, diarrhea, constipation, blood in stool and melena.  Genitourinary: Negative.   Musculoskeletal: Negative.   Skin: Negative.   Neurological: Negative.  Negative for weakness.  Endo/Heme/Allergies: Negative.   Psychiatric/Behavioral: Negative.     Blood pressure 118/61, pulse 67, temperature 98.1 F (36.7 C), temperature source Oral, resp. rate 29, SpO2 92.00%. Physical Exam  Constitutional: He is oriented to person, place, and time. He appears well-developed and well-nourished.  HENT:  Head: Normocephalic and atraumatic.  Mouth/Throat: No oropharyngeal exudate.  Eyes: Conjunctivae normal and EOM are normal. Pupils are equal, round, and reactive to light. Right eye exhibits no discharge. Left eye exhibits no discharge. Scleral icterus is present.  Neck: Normal range of motion. Neck supple. No JVD present. No tracheal deviation present. No thyromegaly present.  Cardiovascular: Normal rate, regular rhythm, normal heart sounds and intact distal pulses.  Exam reveals no gallop and no friction rub.   No murmur heard. Respiratory: Effort normal and breath sounds normal. No stridor. No respiratory distress. He has no wheezes. He has no rales. He exhibits no tenderness.  GI: Soft. Bowel sounds are normal. He exhibits no distension and no mass. There is tenderness. There is rebound and guarding.  Musculoskeletal: Normal range of motion. He exhibits no edema and no tenderness.  Lymphadenopathy:      He  has no cervical adenopathy.  Neurological: He is alert and oriented to person, place, and time.  Skin: Skin is warm and dry. No rash noted. No erythema. No pallor.  Psychiatric: He has a normal mood and affect.     Assessment/Plan    Patient Active Problem List  Diagnosis  . DERMATOPHYTOSIS OF NAIL  . HYPERLIPIDEMIA  . ERECTILE DYSFUNCTION  . TOBACCO USE  . Other, mixed, or unspecified nondependent drug abuse, unspecified  . HYPERTENSION, BENIGN SYSTEMIC  . CARDIOMYOPATHY, HYPERTROPHIC  . ATRIAL FIBRILLATION WITH RAPID VENTRICULAR RESPONSE  . RHINITIS, ALLERGIC NOS  . GERD  . RENAL INSUFFICIENCY, CHRONIC  . SHOULDER PAIN, LEFT  . Pain in Joint, Pelvic Region and Thigh  . KNEE PAIN, RIGHT  . BACK PAIN  . Unspecified disorders of bursae and tendons in shoulder region  . ABNORMALITY OF GAIT  . Cough  . URI (upper respiratory infection)  . Stomach pain  . Bilateral hip pain  Ruptured appendix versus perforated bowel  Plan: 1. Emergent exploratory laparotomy, appendectomy versus possible colon resection and colostomy. 2. NPO, IVF, ABX pre-op, and pain mangement. 3. Post op orders to follow surgical procedure.     Robinette Haines ACNP [facility] Pager # [phone]  09/22/2012, 2:48 PM

## 2012-09-23 LAB — BASIC METABOLIC PANEL
BUN: 20 mg/dL (ref 6–23)
CO2: 25 mEq/L (ref 19–32)
Calcium: 8.1 mg/dL — ABNORMAL LOW (ref 8.4–10.5)
Chloride: 107 mEq/L (ref 96–112)
Chloride: 107 mEq/L (ref 96–112)
Creatinine, Ser: 1.76 mg/dL — ABNORMAL HIGH (ref 0.50–1.35)
Creatinine, Ser: 1.76 mg/dL — ABNORMAL HIGH (ref 0.50–1.35)
GFR calc Af Amer: 49 mL/min — ABNORMAL LOW (ref >90)
GFR calc Af Amer: 49 mL/min — ABNORMAL LOW (ref >90)
GFR calc non Af Amer: 42 mL/min — ABNORMAL LOW (ref >90)
Potassium: 4.8 mEq/L (ref 3.5–5.1)
Sodium: 140 mEq/L (ref 135–145)

## 2012-09-23 LAB — CBC
MCV: 77.5 fL — ABNORMAL LOW (ref 78.0–100.0)
Platelets: 165 10*3/uL (ref 150–400)
RBC: 4.18 MIL/uL — ABNORMAL LOW (ref 4.22–5.81)
WBC: 10.1 10*3/uL (ref 4.0–10.5)

## 2012-09-23 LAB — TROPONIN I
Troponin I: <0.3 ng/mL (ref <0.30)
Troponin I: <0.3 ng/mL (ref <0.30)

## 2012-09-23 MED ORDER — BIOTENE DRY MOUTH MT LIQD
15.0000 mL | Freq: Two times a day (BID) | OROMUCOSAL | Status: DC
  Administered 2012-09-23 – 2012-10-02 (×11): 15 mL via OROMUCOSAL

## 2012-09-23 MED ORDER — SODIUM CHLORIDE 0.9 % IV BOLUS (SEPSIS)
1000.0000 mL | Freq: Once | INTRAVENOUS | Status: AC
  Administered 2012-09-23: 1000 mL via INTRAVENOUS

## 2012-09-23 MED ORDER — CHLORHEXIDINE GLUCONATE 0.12 % MT SOLN
OROMUCOSAL | Status: AC
  Filled 2012-09-23: qty 15

## 2012-09-23 MED ORDER — SODIUM CHLORIDE 0.9 % IV SOLN
INTRAVENOUS | Status: DC
  Administered 2012-09-23: 125 mL via INTRAVENOUS
  Administered 2012-09-23 – 2012-09-27 (×10): via INTRAVENOUS

## 2012-09-23 MED ORDER — CHLORHEXIDINE GLUCONATE 0.12 % MT SOLN
15.0000 mL | Freq: Two times a day (BID) | OROMUCOSAL | Status: DC
  Administered 2012-09-23 – 2012-10-02 (×15): 15 mL via OROMUCOSAL
  Filled 2012-09-23 (×20): qty 15

## 2012-09-23 NOTE — Progress Notes (Addendum)
1 Day Post-Op  Subjective: Feels better than preop but still in pain.  Asking for food. Denies any chest pain.  His troponins have been negative  Objective: Vital signs in last 24 hours: Temp:  [97.5 F (36.4 C)-101.1 F (38.4 C)] 99.2 F (37.3 C) (11/16 0400) Pulse Rate:  [67-89] 71  (11/16 0600) Resp:  [14-29] 15  (11/16 0600) BP: (96-141)/(51-74) 96/58 mmHg (11/16 0600) SpO2:  [92 %-98 %] 93 % (11/16 0600) Weight:  [186 lb 11.7 oz (84.7 kg)] 186 lb 11.7 oz (84.7 kg) (11/16 0400)    Intake/Output from previous day: 11/15 0701 - 11/16 0700 In: 2475 [I.V.:2175; IV Piggyback:300] Out: 815 [Urine:665; Drains:150] Intake/Output this shift:    General appearance: alert, cooperative and no distress Resp: clear to auscultation bilaterally Cardio: normal rate, regular with some pac's GI: soft, appropriate tenderness at incisions, mild distension, JP ss output Extremities: SCD's bilateratlly  Lab Results:   Basename 09/23/12 0354 09/22/12 1045  WBC 10.1 7.3  HGB 10.3* 10.9*  HCT 32.4* 33.0*  PLT 165 162   BMET  Basename 09/23/12 0354 09/22/12 1045  NA 140 139  K 4.8 3.3*  CL 107 104  CO2 25 25  GLUCOSE 102* 126*  BUN 18 16  CREATININE 1.76* 1.54*  CALCIUM 8.0* 9.2   PT/INR No results found for this basename: LABPROT:2,INR:2 in the last 72 hours ABG No results found for this basename: PHART:2,PCO2:2,PO2:2,HCO3:2 in the last 72 hours  Studies/Results: Ct Abdomen Pelvis W Contrast  09/22/2012  *RADIOLOGY REPORT*  Clinical Data: Right lower quadrant pain.  CT ABDOMEN AND PELVIS WITH CONTRAST  Technique:  Multidetector CT imaging of the abdomen and pelvis was performed following the standard protocol during bolus administration of intravenous contrast.  Contrast:  100 ml Omnipaque-300  Comparison: 04/21/2005  Findings: There is volume loss in the lower lobes bilaterally. Heart appears to be large for size.  A trace amount of left pleural fluid or thickening.  There is  also a small amount of anterior perihepatic ascites and a small focus of intraperitoneal air. There appears to be stranding and inflammatory changes along the splenic flexure of the colon.  There is also fluid in this area. There is a small amount of extraluminal gas along the left colon. These findings could be secondary to diverticulitis.  There is free fluid in the pelvis and the right lower quadrant.  The appendix is grossly abnormal and thickening.  High-density material within the appendix suggests appendicoliths.  The appendix measures up to 2 cm and there is surrounding fluid in this area. There is free air near the base of the appendix best seen on sequence #2, image 61.  There is an abnormal appearance of both kidneys.  The left kidney demonstrates areas of cortical scarring, particularly involving the left lower pole.  The delayed images demonstrate a striated enhancing pattern which could represent perfusion abnormalities or infection.  There are scattered areas of cortical scarring throughout the right kidney and there is marked dilatation of the entire right renal artery.  The proximal right renal artery measures up to 1.8 cm.  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  All of the visualized right renal arteries are diffusely enlarged.  There is no evidence to suggest a right perinephric hematoma.  There is an indeterminate 1.1 cm low density structure in the posterior right hepatic lobe on image 13, sequence #2.  This is not suggestive  for a simple cyst.  The portal venous system is patent. No gross abnormality to the spleen or pancreas.  There may be a 1.6 cm right adrenal nodule which is poorly characterized.  Limited evaluation of pelvis due to bilateral hip hardware.  The may be mild wall thickening of the urinary bladder.  There is a right hip replacement.  Severe left hip arthritis. There is a screw extending through the left femoral head and  neck.  IMPRESSION: The study is positive for free fluid and free air.  There is an abnormal appearance of the appendix and the findings are highly concerning for a perforated appendicitis.  In addition, there are small foci of extraluminal air along the left colon that could represent acute diverticulitis in this area.  Abnormal appearance of both kidneys with diffuse cortical scarring and abnormal enhancement.  Findings could be related to previous scarring but cannot exclude an inflammatory or infectious etiology in the kidneys.  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  Recommend vascular surgery consultation.  Indeterminate 1.1 cm low density structure in the right hepatic lobe and question an adrenal nodule.  These areas warrant surveillance or further evaluation with MRI.  Critical Value/emergent results were called by telephone at the time of interpretation on 09/22/2012 at 1:21 p.m. to [facility], who verbally acknowledged these results.   Original Report Authenticated By: Markus Daft, M.D.     Anti-infectives: Anti-infectives     Start     Dose/Rate Route Frequency Ordered Stop   09/23/12 0600   ertapenem (INVANZ) 1 g in sodium chloride 0.9 % 50 mL IVPB  Status:  Discontinued        1 g 100 mL/hr over 30 Minutes Intravenous 60 min pre-op 09/22/12 1440 09/22/12 1846   09/22/12 2200   metroNIDAZOLE (FLAGYL) IVPB 500 mg        500 mg 100 mL/hr over 60 Minutes Intravenous Every 8 hours 09/22/12 1903     09/22/12 2000  piperacillin-tazobactam (ZOSYN) IVPB 3.375 g       3.375 g 12.5 mL/hr over 240 Minutes Intravenous Every 8 hours 09/22/12 1903     09/22/12 1400   ertapenem (INVANZ) 1 g in sodium chloride 0.9 % 50 mL IVPB        1 g 100 mL/hr over 30 Minutes Intravenous  Once 09/22/12 1345 09/22/12 1630          Assessment/Plan: s/p Procedure(s) (LRB) with  comments: LAPAROSCOPY DIAGNOSTIC (N/A) APPENDECTOMY LAPAROSCOPIC () d/c foley plan to mobilize today.  He seems to be doing okay.  would go slow on advancing diet because likely to develop ileus.  continue abx.will bolus fluid and increase maintenance rate due to increased creatinine.  LOS: 1 day    Miquela Costabile DAVID 09/23/2012 troponins have been negative and still asymptomatic.  His EKG abnormalities are likely not new.  Will continue to follow in ICU today and likely to tele tomorrow.

## 2012-09-23 NOTE — Progress Notes (Signed)
Unit monitor alarming elevated ST segment. Pt a-symptomatic. Vital signs WNL. Denies chest pain/SOB. 12 lead EKG done. MD Lilyan Punt made aware. Troponin level ordered. Elink MD also called and consulted for EKG interpretation. Will do follow up EKG in 8 hours.  Rolla Plate

## 2012-09-23 NOTE — Progress Notes (Signed)
Utilization review completed.  

## 2012-09-23 NOTE — Op Note (Signed)
NAMELIVIO, LEDWITH NO.:  [id number]  MEDICAL RECORD NO.:  [id number]  LOCATION:  6                         FACILITY:  [facility]  PHYSICIAN:  Coralie Keens, M.D. DATE OF BIRTH:  27-Mar-1961  DATE OF PROCEDURE:  09/22/2012 DATE OF DISCHARGE:                              OPERATIVE REPORT   PREOPERATIVE DIAGNOSIS:  Acute abdomen with perforated bowel.  POSTOPERATIVE DIAGNOSIS:  Perforated appendicitis.  PROCEDURE: 1. Diagnostic laparoscopy 2. Laparoscopic appendectomy.  SURGEON:  Coralie Keens, MD  ANESTHESIA:  General and 0.5% Marcaine.  ESTIMATED BLOOD LOSS:  Minimal.  INDICATIONS:  This is a 51 year old gentleman, who presented with acute abdomen.  CAT scan of the abdomen showed free air and a large amount of fluid in the abdomen.  CAT scan favored perforated appendicitis versus diverticulitis.  FINDINGS:  The patient was indeed found to have perforated appendicitis with gross contamination with large amount of purulence throughout the abdominal cavity.  PROCEDURE IN DETAIL:  The patient was brought to the operating room, identified as Paul [name].  He was placed supine on the operating room table and general anesthesia was induced.  His abdomen was prepped and draped in usual sterile fashion.  A Foley catheter had been inserted.  I made a small incision below the umbilicus.  This was carried down to fascia which was opened with scalpel.  A hemostat was used to pass the peritoneal cavity under direct vision.  A 0 Vicryl pursestring suture was placed around the fascial opening.  The Hasson port was placed through the opening and insufflation of the abdomen was begun.  A 5 mm port was then placed in the patient's right upper quadrant and another one in the left lower quadrant under direct vision. The patient was found to have large amount of gross purulence and turbid fluid throughout the abdomen.  I evaluated the cecum and found  the appendix which was acutely dilated and perforated from the side.  I took down the mesoappendix with the Harmonic scalpel.  The appendix fell apart as I was doing this.  I was able to, however, control the base with the endoscopic GIA stapler.  I then placed all the pieces of the appendix as well as a appendicolith and an endosac removed through the incision of the umbilicus.  I evaluated the appendiceal stump and hemostasis appeared to be achieved.  I then thoroughly irrigated the abdomen with 4 L of normal saline.  I did not see evidence of diverticulitis.  Once I irrigated all 4 quadrants thoroughly, I made a small incision in the right lower quadrant and placed a 19-French Blake drain through the incision under direct vision and then placed this in the right side of the abdomen.  I sewed this in place with a nylon suture.  Again hemostasis appeared to be achieved.  All ports removed under direct vision.  The abdomen was deflated.  I tied the 0 Vicryl at the umbilicus closing the fascial defect.  All incisions were anesthetized with Marcaine and closed with skin staples.  The patient tolerated the procedure fairly well.  All sponge, needle, and instrument counts were correct at the end of procedure.  The patient was  then extubated in the operating room and taken in stable condition to recovery room.     Coralie Keens, M.D.     DB/MEDQ  D:  09/22/2012  T:  09/23/2012  Job:  [id number]

## 2012-09-24 ENCOUNTER — Inpatient Hospital Stay (HOSPITAL_COMMUNITY): Payer: Medicare HMO

## 2012-09-24 DIAGNOSIS — I722 Aneurysm of renal artery: Secondary | ICD-10-CM

## 2012-09-24 DIAGNOSIS — I119 Hypertensive heart disease without heart failure: Secondary | ICD-10-CM

## 2012-09-24 DIAGNOSIS — I517 Cardiomegaly: Secondary | ICD-10-CM

## 2012-09-24 DIAGNOSIS — I10 Essential (primary) hypertension: Secondary | ICD-10-CM

## 2012-09-24 DIAGNOSIS — R9431 Abnormal electrocardiogram [ECG] [EKG]: Secondary | ICD-10-CM

## 2012-09-24 DIAGNOSIS — I43 Cardiomyopathy in diseases classified elsewhere: Secondary | ICD-10-CM | POA: Diagnosis present

## 2012-09-24 HISTORY — DX: Hypertensive heart disease without heart failure: I11.9

## 2012-09-24 LAB — CBC WITH DIFFERENTIAL/PLATELET
Basophils Absolute: 0 10*3/uL (ref 0.0–0.1)
Eosinophils Relative: 0 % (ref 0–5)
Lymphocytes Relative: 5 % — ABNORMAL LOW (ref 12–46)
Lymphs Abs: 0.6 10*3/uL — ABNORMAL LOW (ref 0.7–4.0)
Neutrophils Relative %: 90 % — ABNORMAL HIGH (ref 43–77)
Platelets: 179 10*3/uL (ref 150–400)
RBC: 4.14 MIL/uL — ABNORMAL LOW (ref 4.22–5.81)
RDW: 13.3 % (ref 11.5–15.5)
WBC: 12.5 10*3/uL — ABNORMAL HIGH (ref 4.0–10.5)

## 2012-09-24 LAB — BASIC METABOLIC PANEL
Calcium: 8.5 mg/dL (ref 8.4–10.5)
Creatinine, Ser: 1.73 mg/dL — ABNORMAL HIGH (ref 0.50–1.35)
GFR calc Af Amer: 50 mL/min — ABNORMAL LOW (ref >90)
GFR calc non Af Amer: 43 mL/min — ABNORMAL LOW (ref >90)
Sodium: 137 mEq/L (ref 135–145)

## 2012-09-24 MED ORDER — PANTOPRAZOLE SODIUM 40 MG IV SOLR
40.0000 mg | INTRAVENOUS | Status: DC
  Administered 2012-09-24 – 2012-10-03 (×10): 40 mg via INTRAVENOUS
  Filled 2012-09-24 (×10): qty 40

## 2012-09-24 MED ORDER — NICOTINE 14 MG/24HR TD PT24
14.0000 mg | MEDICATED_PATCH | Freq: Every day | TRANSDERMAL | Status: DC
Start: 2012-09-24 — End: 2012-10-04
  Administered 2012-09-24 – 2012-10-01 (×8): 14 mg via TRANSDERMAL
  Filled 2012-09-24 (×11): qty 1

## 2012-09-24 MED ORDER — LORAZEPAM 0.5 MG PO TABS
0.5000 mg | ORAL_TABLET | Freq: Once | ORAL | Status: AC
  Administered 2012-09-24: 0.5 mg via ORAL
  Filled 2012-09-24: qty 1

## 2012-09-24 MED ORDER — METOPROLOL TARTRATE 1 MG/ML IV SOLN
2.5000 mg | Freq: Once | INTRAVENOUS | Status: AC
  Administered 2012-09-25: 2.5 mg via INTRAVENOUS
  Filled 2012-09-24: qty 5

## 2012-09-24 MED ORDER — ASPIRIN 81 MG PO CHEW
81.0000 mg | CHEWABLE_TABLET | Freq: Every day | ORAL | Status: DC
  Administered 2012-09-24 – 2012-10-04 (×11): 81 mg via ORAL
  Filled 2012-09-24 (×11): qty 1

## 2012-09-24 MED ORDER — METOPROLOL TARTRATE 25 MG PO TABS
25.0000 mg | ORAL_TABLET | Freq: Two times a day (BID) | ORAL | Status: DC
  Filled 2012-09-24 (×2): qty 1

## 2012-09-24 MED ORDER — LIDOCAINE VISCOUS 2 % MT SOLN
20.0000 mL | Freq: Once | OROMUCOSAL | Status: AC
  Administered 2012-09-24: 20 mL via OROMUCOSAL
  Filled 2012-09-24: qty 20

## 2012-09-24 MED ORDER — METOPROLOL TARTRATE 25 MG PO TABS
25.0000 mg | ORAL_TABLET | Freq: Two times a day (BID) | ORAL | Status: DC
  Administered 2012-09-24: 25 mg via ORAL
  Filled 2012-09-24 (×3): qty 1

## 2012-09-24 NOTE — Progress Notes (Signed)
NG tube placed and he feels much better.  HR 90.  Pain and nausea improved

## 2012-09-24 NOTE — Progress Notes (Signed)
*  PRELIMINARY RESULTS* Echocardiogram 2D Echocardiogram has been performed.  [place], [place] 09/24/2012, 10:07 AM

## 2012-09-24 NOTE — Progress Notes (Signed)
2 Days Post-Op  Subjective: Still asking for food.  Has some tightness in his throat  Objective: Vital signs in last 24 hours: Temp:  [98.7 F (37.1 C)-99.6 F (37.6 C)] 99.1 F (37.3 C) (11/17 0400) Pulse Rate:  [71-77] 72  (11/17 0400) Resp:  [13-26] 14  (11/17 0400) BP: (111-129)/(60-78) 111/60 mmHg (11/17 0400) SpO2:  [89 %-98 %] 96 % (11/17 0400) Weight:  [194 lb 3.6 oz (88.1 kg)] 194 lb 3.6 oz (88.1 kg) (11/17 0400)    Intake/Output from previous day: 11/16 0701 - 11/17 0700 In: 4691 [P.O.:630; I.V.:2811; IV Piggyback:1250] Out: 1580 [Urine:1550; Drains:30] Intake/Output this shift:    General appearance: alert, cooperative and no distress Resp: clear to auscultation bilaterally and nonlabored Cardio: normal rate, regular but with occasional premature beats GI: soft, mainly right sided tenderness, JP ss, no peritoneal signs.  Lab Results:   [facility] 09/24/12 0410 09/23/12 0354  WBC 12.5* 10.1  HGB 10.1* 10.3*  HCT 32.2* 32.4*  PLT 179 165   BMET  Basename 09/24/12 0410 09/23/12 1353  NA 137 140  K 3.9 4.2  CL 103 107  CO2 26 25  GLUCOSE 99 107*  BUN 18 20  CREATININE 1.73* 1.76*  CALCIUM 8.5 8.1*   PT/INR No results found for this basename: LABPROT:2,INR:2 in the last 72 hours ABG No results found for this basename: PHART:2,PCO2:2,PO2:2,HCO3:2 in the last 72 hours  Studies/Results: Ct Abdomen Pelvis W Contrast  09/22/2012  *RADIOLOGY REPORT*  Clinical Data: Right lower quadrant pain.  CT ABDOMEN AND PELVIS WITH CONTRAST  Technique:  Multidetector CT imaging of the abdomen and pelvis was performed following the standard protocol during bolus administration of intravenous contrast.  Contrast:  100 ml Omnipaque-300  Comparison: 04/21/2005  Findings: There is volume loss in the lower lobes bilaterally. Heart appears to be large for size.  A trace amount of left pleural fluid or thickening.  There is also a small amount of anterior perihepatic ascites and a  small focus of intraperitoneal air. There appears to be stranding and inflammatory changes along the splenic flexure of the colon.  There is also fluid in this area. There is a small amount of extraluminal gas along the left colon. These findings could be secondary to diverticulitis.  There is free fluid in the pelvis and the right lower quadrant.  The appendix is grossly abnormal and thickening.  High-density material within the appendix suggests appendicoliths.  The appendix measures up to 2 cm and there is surrounding fluid in this area. There is free air near the base of the appendix best seen on sequence #2, image 61.  There is an abnormal appearance of both kidneys.  The left kidney demonstrates areas of cortical scarring, particularly involving the left lower pole.  The delayed images demonstrate a striated enhancing pattern which could represent perfusion abnormalities or infection.  There are scattered areas of cortical scarring throughout the right kidney and there is marked dilatation of the entire right renal artery.  The proximal right renal artery measures up to 1.8 cm.  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  All of the visualized right renal arteries are diffusely enlarged.  There is no evidence to suggest a right perinephric hematoma.  There is an indeterminate 1.1 cm low density structure in the posterior right hepatic lobe on image 13, sequence #2.  This is not suggestive for a simple cyst.  The portal venous system  is patent. No gross abnormality to the spleen or pancreas.  There may be a 1.6 cm right adrenal nodule which is poorly characterized.  Limited evaluation of pelvis due to bilateral hip hardware.  The may be mild wall thickening of the urinary bladder.  There is a right hip replacement.  Severe left hip arthritis. There is a screw extending through the left femoral head and neck.  IMPRESSION: The study is positive for free fluid and  free air.  There is an abnormal appearance of the appendix and the findings are highly concerning for a perforated appendicitis.  In addition, there are small foci of extraluminal air along the left colon that could represent acute diverticulitis in this area.  Abnormal appearance of both kidneys with diffuse cortical scarring and abnormal enhancement.  Findings could be related to previous scarring but cannot exclude an inflammatory or infectious etiology in the kidneys.  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  Recommend vascular surgery consultation.  Indeterminate 1.1 cm low density structure in the right hepatic lobe and question an adrenal nodule.  These areas warrant surveillance or further evaluation with MRI.  Critical Value/emergent results were called by telephone at the time of interpretation on 09/22/2012 at 1:21 p.m. to [facility], who verbally acknowledged these results.   Original Report Authenticated By: Markus Daft, M.D.     Anti-infectives: Anti-infectives     Start     Dose/Rate Route Frequency Ordered Stop   09/23/12 0600   ertapenem (INVANZ) 1 g in sodium chloride 0.9 % 50 mL IVPB  Status:  Discontinued        1 g 100 mL/hr over 30 Minutes Intravenous 60 min pre-op 09/22/12 1440 09/22/12 1846   09/22/12 2200   metroNIDAZOLE (FLAGYL) IVPB 500 mg  Status:  Discontinued        500 mg 100 mL/hr over 60 Minutes Intravenous Every 8 hours 09/22/12 1903 09/23/12 0804   09/22/12 2000  piperacillin-tazobactam (ZOSYN) IVPB 3.375 g       3.375 g 12.5 mL/hr over 240 Minutes Intravenous Every 8 hours 09/22/12 1903     09/22/12 1400   ertapenem (INVANZ) 1 g in sodium chloride 0.9 % 50 mL IVPB        1 g 100 mL/hr over 30 Minutes Intravenous  Once 09/22/12 1345 09/22/12 1630          Assessment/Plan: s/p Procedure(s) (LRB) with comments: LAPAROSCOPY  DIAGNOSTIC (N/A) APPENDECTOMY LAPAROSCOPIC () d/c foley we will try to remove foley again.  Cr stable. His Cr was 1.5 in 2/13.  He seems to be doing okay from an abdominal standpoint.  He is asking for food and though he will be high risk for postop ileus given his peritonitis and perforated appendicitis, we will try to advance his diet.  He also has some "tightness" in his throat and given his EKG, I have asked cardiology to evaluate his EKG.  His troponins have been negative and I do not really think that this is due to cardiac cause but we will see what cardiology says about this.  If he thinks that the EKG looks okay, then we will plan for transfer to telemetry.  LOS: 2 days    [place], Versie Soave DAVID 09/24/2012 I appreciate Dr. Doug Sou input.  Planning for echocardiogram.

## 2012-09-24 NOTE — Consult Note (Signed)
CARDIOLOGY CONSULT NOTE    Patient ID: Paul [name] MRN: [id number] DOB/AGE: [birth year] 51 y.o.  Admit date: 09/22/2012 Referring Physician Paul Hook MD Primary Physician Paul Salvage MD Primary Cardiologist N/A Reason for Consultation Abnormal Ecg  HPI: [name] seen at the request of Dr. Lilyan [name] for evaluation of abnormal ECG. He is a pleasant 51 year old white male with history of hypertension and paroxysmal atrial fibrillation. He was admitted on 09/22/2012 with an acute abdomen. He was found to have perforated appendicitis. He underwent surgery. His preoperative ECG showed LVH with significant T wave inversion in the lateral leads. This was new compared to 2009. Repeat ECG postoperatively showed improvement and T wave inversion. Patient denies any symptoms of chest pain or shortness of breath. He has no history of coronary disease. Cardiac troponins have been negative. Patient does have a history of paroxysmal atrial fibrillation. This was noted in 2009 by EMS. By the time he arrived in the emergency room this had resolved. He had an echocardiogram at that time which showed severe LVH with normal systolic function. He does have a history of hypertension but has been on no medications over the past one to 2 years with apparently fairly good blood pressure control. Patient was also admitted with presyncope in 2006. ECG at that time describes lateral T-wave inversion. Is tracings are not available to review.  Review of system is positive for belching and sensation of choking. All other systems are reviewed and are negative.  Past Medical History  Diagnosis Date  . Hypertensive cardiomyopathy   . Hypertension   . Chronic kidney disease   . Atrial fibrillation     Isolated episode    History reviewed. No pertinent family history.  History   Social History  . Marital Status: Divorced    Spouse Name: N/A    Number of Children: N/A  . Years of Education: N/A   Occupational  History  . Not on file.   Social History Main Topics  . Smoking status: Passive Smoke Exposure - Never Smoker  . Smokeless tobacco: Never Used  . Alcohol Use: Yes     Comment: occasional  . Drug Use: No  . Sexually Active: Not on file   Other Topics Concern  . Not on file   Social History Narrative  . No narrative on file    Past Surgical History  Procedure Date  . Hip surgery   . Total hip arthroplasty     x2  . Appendectomy      Prescriptions prior to admission  Medication Sig Dispense Refill  . fluticasone (FLONASE) 50 MCG/ACT nasal spray Place 1 spray into the nose daily.  16 g  12  . montelukast (SINGULAIR) 10 MG tablet TAKE 1 TABLET BY MOUTH AT BEDTIME  30 tablet  1  . ranitidine (ZANTAC) 300 MG tablet Take 300 mg by mouth at bedtime.        . traMADol (ULTRAM) 50 MG tablet Take 1 tablet (50 mg total) by mouth every 6 (six) hours as needed for pain.  30 tablet  2    Physical Exam: Blood pressure 131/73, pulse 69, temperature 99.1 F (37.3 C), temperature source Oral, resp. rate 14, height $RemoveBe'5\' 11"'JljjuLbAy$  (1.803 m), weight 88.1 kg (194 lb 3.6 oz), SpO2 96.00%. Patient is a pleasant black male in no acute distress. He is normocephalic, atraumatic. Pupils are equal round and reactive to light accommodation. Sclera clear. Oropharynx is clear. He is wearing nasal oxygen. Neck is supple  no jugular venous distention, adenopathy, thyromegaly, or bruits. Lungs are clear. Cardiac exam reveals a regular rate and rhythm without gallop, murmur, or click. Chest wall is nontender. Abdomen is soft and nontender with healing surgical incisions. Extremities reveal good pedal pulses. He has no cyanosis or edema. Skin is warm and dry. He is alert and oriented x4. Cranial nerves II through XII are intact. Motor and sensory exams are nonfocal. Mood is appropriate.  Labs:   Lab Results  Component Value Date   WBC 12.5* 09/24/2012   HGB 10.1* 09/24/2012   HCT 32.2* 09/24/2012   MCV 77.8* 09/24/2012     PLT 179 09/24/2012    Lab 09/24/12 0410 09/22/12 1045  NA 137 --  K 3.9 --  CL 103 --  CO2 26 --  BUN 18 --  CREATININE 1.73* --  CALCIUM 8.5 --  PROT -- 6.8  BILITOT -- 1.4*  ALKPHOS -- 72  ALT -- 15  AST -- 19  GLUCOSE 99 --   Lab Results  Component Value Date   CKTOTAL 138 02/18/2008   CKMB 3.5 02/18/2008   TROPONINI <0.30 09/23/2012    Lab Results  Component Value Date   CHOL 172 02/21/2008   Lab Results  Component Value Date   HDL 44 02/21/2008   Lab Results  Component Value Date   LDLCALC 107* 02/21/2008   Lab Results  Component Value Date   TRIG 104 02/21/2008   Lab Results  Component Value Date   CHOLHDL 3.9 Ratio 02/21/2008   Lab Results  Component Value Date   LDLDIRECT 111* 03/31/2007      Radiology:CT Abdomen Pelvis W Contrast [[id number]] Resulted:09/22/12 8338 Order Status:Completed Updated:09/22/12 2505 Narrative: *RADIOLOGY REPORT*  Clinical Data: Right lower quadrant pain.  CT ABDOMEN AND PELVIS WITH CONTRAST  Technique: Multidetector CT imaging of the abdomen and pelvis was performed following the standard protocol during bolus administration of intravenous contrast.  Contrast: 100 ml Omnipaque-300  Comparison: 04/21/2005  Findings: There is volume loss in the lower lobes bilaterally. Heart appears to be large for size. A trace amount of left pleural fluid or thickening. There is also a small amount of anterior perihepatic ascites and a small focus of intraperitoneal air. There appears to be stranding and inflammatory changes along the splenic flexure of the colon. There is also fluid in this area. There is a small amount of extraluminal gas along the left colon. These findings could be secondary to diverticulitis. There is free fluid in the pelvis and the right lower quadrant.  The appendix is grossly abnormal and thickening. High-density material within the appendix suggests appendicoliths. The appendix measures up to 2 cm and  there is surrounding fluid in this area. There is free air near the base of the appendix best seen on sequence #2, image 61.  There is an abnormal appearance of both kidneys. The left kidney demonstrates areas of cortical scarring, particularly involving the left lower pole. The delayed images demonstrate a striated enhancing pattern which could represent perfusion abnormalities or infection. There are scattered areas of cortical scarring throughout the right kidney and there is marked dilatation of the entire right renal artery. The proximal right renal artery measures up to 1.8 cm. 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 All of the visualized right renal arteries are diffusely enlarged. There is no evidence to suggest a right perinephric hematoma.  There is an indeterminate 1.1 cm low density  structure in the posterior right hepatic lobe on image 13, sequence #2. This is not suggestive for a simple cyst. The portal venous system is patent. No gross abnormality to the spleen or pancreas. There may be a 1.6 cm right adrenal nodule which is poorly characterized.  Limited evaluation of pelvis due to bilateral hip hardware. The may be mild wall thickening of the urinary bladder.  There is a right hip replacement. Severe left hip arthritis. There is a screw extending through the left femoral head and neck.  IMPRESSION: The study is positive for free fluid and free air. There is an abnormal appearance of the appendix and the findings are highly concerning for a perforated appendicitis. In addition, there are small foci of extraluminal air along the left colon that could represent acute diverticulitis in this area.  Abnormal appearance of both kidneys with diffuse cortical scarring and abnormal enhancement. Findings could be related to previous scarring but cannot exclude an inflammatory or infectious etiology in the  kidneys.  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 Recommend vascular surgery consultation.  Indeterminate 1.1 cm low density structure in the right hepatic lobe and question an adrenal nodule. These areas warrant surveillance or further evaluation with MRI.  Original Report Authenticated By: Markus Daft, M.D  EKG: Normal sinus rhythm with T-wave flattening in leads 1, 2, 3, aVL, aVF, and T wave inversion in leads V4 through V6.  ASSESSMENT AND PLAN:  1. Abnormal ECG. Cardiac enzymes were all negative. No clinical symptoms of chest pain. ECG findings were similar in 2004 and 2006. I suspect this is related to long-standing hypertension and LVH. 2. Paroxysmal atrial fibrillation. Patient had a single episode in 2009 that was nonsustained. 3. Acute appendicitis with rupture. Status post surgery. 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 Patient had an abnormal ultrasound of the kidneys in 2006. Followup CT was recommended but never performed. I suspect this is a chronic finding but needs to be evaluated by vascular surgery. 5. Hypertension controlled on no prior medication.  Plan: We'll obtain an echocardiogram while here in the hospital. Blood pressure is controlled. We will hold metoprolol for now. Recommend vascular surgery consult either while in house or as an outpatient.  SignedCollier Salina [facility] 09/24/2012, 8:26 AM

## 2012-09-24 NOTE — Progress Notes (Signed)
Pt with episodes of vomiting small amounts of green emesis.  Notifed MD on call, Dr. Lilyan Punt. Also informed MD of pts inability to urinate since removal of foley catheter early this am.  New orders received and followed. Will continue to monitor.

## 2012-09-24 NOTE — Progress Notes (Signed)
He is nauseated, and has just vomited ~72ml of dark green bilious emesis.  He also c/o diffuse lower abd. Pain, for which I give him IV med. At this time.  His skin is normal, warm and dry, and he is breathing normally.  His abd. Drsngs. Remain dry and intact.

## 2012-09-24 NOTE — Progress Notes (Signed)
I was called with vomiting and abdominal pain.  He has not had pain meds for 8 hours.  I recommended pain medication more frequently and abdominal xrays for nausea and vomiting as he will be high risk for ileus.  I have reviewed the films with the radiologist and he has a large amount of upper abdominal gas unsure if this is stomach or small bowel.  Will place NG and see if gets good relief. We may repeat abdominal films or CT if his symptoms do not improve much with NG.

## 2012-09-25 ENCOUNTER — Encounter (HOSPITAL_COMMUNITY): Payer: Self-pay | Admitting: Surgery

## 2012-09-25 ENCOUNTER — Inpatient Hospital Stay (HOSPITAL_COMMUNITY): Payer: Medicare HMO

## 2012-09-25 DIAGNOSIS — I4891 Unspecified atrial fibrillation: Secondary | ICD-10-CM

## 2012-09-25 LAB — BASIC METABOLIC PANEL
CO2: 24 mEq/L (ref 19–32)
Calcium: 8.5 mg/dL (ref 8.4–10.5)
Chloride: 106 mEq/L (ref 96–112)
Creatinine, Ser: 1.8 mg/dL — ABNORMAL HIGH (ref 0.50–1.35)
GFR calc Af Amer: 48 mL/min — ABNORMAL LOW (ref >90)
Sodium: 139 mEq/L (ref 135–145)

## 2012-09-25 LAB — CBC WITH DIFFERENTIAL/PLATELET
Basophils Absolute: 0 10*3/uL (ref 0.0–0.1)
Basophils Relative: 0 % (ref 0–1)
Eosinophils Relative: 1 % (ref 0–5)
Lymphocytes Relative: 5 % — ABNORMAL LOW (ref 12–46)
MCHC: 31.7 g/dL (ref 30.0–36.0)
Neutro Abs: 13.8 10*3/uL — ABNORMAL HIGH (ref 1.7–7.7)
Platelets: 165 10*3/uL (ref 150–400)
RDW: 13.1 % (ref 11.5–15.5)
WBC: 15.5 10*3/uL — ABNORMAL HIGH (ref 4.0–10.5)

## 2012-09-25 LAB — MAGNESIUM: Magnesium: 1.7 mg/dL (ref 1.5–2.5)

## 2012-09-25 LAB — TSH: TSH: 0.57 u[IU]/mL (ref 0.350–4.500)

## 2012-09-25 MED ORDER — DILTIAZEM LOAD VIA INFUSION
10.0000 mg | Freq: Once | INTRAVENOUS | Status: DC
  Administered 2012-09-25: 10 mg via INTRAVENOUS
  Filled 2012-09-25: qty 10

## 2012-09-25 MED ORDER — METOPROLOL TARTRATE 1 MG/ML IV SOLN
2.5000 mg | Freq: Four times a day (QID) | INTRAVENOUS | Status: DC
  Administered 2012-09-25 – 2012-09-28 (×12): 2.5 mg via INTRAVENOUS
  Filled 2012-09-25 (×17): qty 5

## 2012-09-25 MED ORDER — ALBUTEROL SULFATE (5 MG/ML) 0.5% IN NEBU
2.5000 mg | INHALATION_SOLUTION | RESPIRATORY_TRACT | Status: DC | PRN
  Administered 2012-09-25: 2.5 mg via RESPIRATORY_TRACT
  Filled 2012-09-25: qty 0.5

## 2012-09-25 MED ORDER — DILTIAZEM HCL 100 MG IV SOLR: 5.0000 mg/h | INTRAVENOUS | Status: DC

## 2012-09-25 MED ORDER — METOPROLOL TARTRATE 1 MG/ML IV SOLN
2.5000 mg | Freq: Once | INTRAVENOUS | Status: AC
  Filled 2012-09-25: qty 5

## 2012-09-25 MED ORDER — DILTIAZEM HCL 100 MG IV SOLR
5.0000 mg/h | INTRAVENOUS | Status: DC
  Administered 2012-09-25: 15 mg/h via INTRAVENOUS
  Administered 2012-09-25: 5 mg/h via INTRAVENOUS
  Administered 2012-09-25 – 2012-09-26 (×2): 15 mg/h via INTRAVENOUS
  Filled 2012-09-25: qty 100

## 2012-09-25 MED ORDER — LORAZEPAM 2 MG/ML IJ SOLN
0.5000 mg | Freq: Once | INTRAMUSCULAR | Status: AC
  Administered 2012-09-25: 0.5 mg via INTRAVENOUS
  Filled 2012-09-25: qty 1

## 2012-09-25 MED ORDER — DILTIAZEM HCL 100 MG IV SOLR
5.0000 mg/h | INTRAVENOUS | Status: DC
  Filled 2012-09-25: qty 100

## 2012-09-25 MED ORDER — PHENOL 1.4 % MT LIQD
1.0000 | OROMUCOSAL | Status: DC | PRN
  Filled 2012-09-25: qty 177

## 2012-09-25 MED ORDER — DILTIAZEM LOAD VIA INFUSION
10.0000 mg | Freq: Once | INTRAVENOUS | Status: DC
  Filled 2012-09-25: qty 10

## 2012-09-25 MED ORDER — DILTIAZEM LOAD VIA INFUSION: 10.0000 mg | Freq: Once | INTRAVENOUS | Status: DC

## 2012-09-25 NOTE — Progress Notes (Signed)
Brief Note Patient anxious earlier while in atrial fibrillation + RVR. He has had benzodiazepenes before and tolerates them well. Metoprolol 2.5 mg IV x1 given. Ativan oral 0.5 mg PO given x1 along with home metoprolol. He continued to have anxiety so 0.5 mg IV given x1 with much improved symptoms and patient able to go to sleep. HR continued to range from 100-160 (mainly higher) despite patient relaxed so cardizem trial warranted but patient must go to stepdown. Transfer orders written; as I'm only consulting I did not change any orders but added on dilitiazem 10 mg IV bolus + gtt with holding parameters.   Jules Husbands, MD

## 2012-09-25 NOTE — Progress Notes (Signed)
Patient ID: Paul [name], male   DOB: May 28, 1961, 51 y.o.   MRN: [id number] 3 Days Post-Op  Subjective: No non-rebreather and gurgling, coughing up blood tinged sputum, reports abd pain, denies nausea or vomiting.  Objective: Vital signs in last 24 hours: Temp:  [97.5 F (36.4 C)-99.5 F (37.5 C)] 97.6 F (36.4 C) (11/18 0400) Pulse Rate:  [41-117] 117  (11/18 0500) Resp:  [14-25] 15  (11/18 0500) BP: (106-139)/(63-88) 117/72 mmHg (11/18 0500) SpO2:  [77 %-100 %] 93 % (11/18 0500) Weight:  [191 lb 2.2 oz (86.7 kg)-194 lb 3.6 oz (88.1 kg)] 191 lb 2.2 oz (86.7 kg) (11/18 0400)    Intake/Output from previous day: 11/17 0701 - 11/18 0700 In: 3271.6 [I.V.:3021.6; IV Piggyback:250] Out: 7106 [Urine:750; Emesis/NG output:1650; Drains:185] Intake/Output this shift:    General appearance: alert, mild distress and some resp distress Resp: diminished breath sounds bilaterally and somewhat labored. Cardio: tachy GI: mildly distended, right sided tenderness, JP ss, no peritoneal signs.  Lab Results:   Basename 09/25/12 0459 09/24/12 0410  WBC 15.5* 12.5*  HGB 9.3* 10.1*  HCT 29.3* 32.2*  PLT 165 179   BMET  Basename 09/25/12 0459 09/24/12 0410  NA 139 137  K 3.8 3.9  CL 106 103  CO2 24 26  GLUCOSE 116* 99  BUN 19 18  CREATININE 1.80* 1.73*  CALCIUM 8.5 8.5   PT/INR No results found for this basename: LABPROT:2,INR:2 in the last 72 hours ABG No results found for this basename: PHART:2,PCO2:2,PO2:2,HCO3:2 in the last 72 hours  Studies/Results: Dg Abd 1 View  09/25/2012  *RADIOLOGY REPORT*  Clinical Data: NG tube placement  ABDOMEN - 1 VIEW  Comparison: 09/24/2012 radiograph  Findings: NG tube projects over the left upper quadrant.  Interval decreased gastric distension. Persistent mild bowel distension is partially imaged.  Bibasilar opacities.  Lower abdomen/pelvis not included on the image.  IMPRESSION: NG tube tip projects over the stomach.   Original Report  Authenticated By: Carlos Levering, M.D.    Dg Abd 2 Views  09/24/2012  *RADIOLOGY REPORT*  Clinical Data: Abdominal pain, nausea, vomiting, 2 days post surgery for perforated appendicitis  ABDOMEN - 2 VIEW  Comparison: None Correlation:  CT abdomen and pelvis 09/22/2012  Findings: Gaseous distention of bowel in the upper abdomen. A more rounded collection of gas is seen in the central and right upper abdomen, uncertain if is distended stomach containing fluid/gas or potentially colon. Surgical drain in pelvis. Mild wall thickening of a single bowel loop in the right upper quadrant on the left lateral decubitus view. No gross free intraperitoneal air identified. Osseous structures unremarkable. Small amount of residual contrast in the pelvis. Prior right hip replacement and left femoral ORIF. Advanced osteoarthritic changes of the left hip.  IMPRESSION: Distended bowel in the upper abdomen question postoperative ileus. A more rounded gas collection in the right upper quadrant on the supine image demonstrates a prominent air-fluid level on the left lateral decubitus image, potentially representing gas and fluid within a distended stomach, less likely colon. Consider placing nasogastric tube and repeating image to determine if this resolves. Single loop of small bowel in the upper abdomen demonstrates mild wall thickening.   Original Report Authenticated By: Lavonia Dana, M.D.     Anti-infectives: Anti-infectives     Start     Dose/Rate Route Frequency Ordered Stop   09/23/12 0600   ertapenem (INVANZ) 1 g in sodium chloride 0.9 % 50 mL IVPB  Status:  Discontinued  1 g 100 mL/hr over 30 Minutes Intravenous 60 min pre-op 09/22/12 1440 09/22/12 1846   09/22/12 2200   metroNIDAZOLE (FLAGYL) IVPB 500 mg  Status:  Discontinued        500 mg 100 mL/hr over 60 Minutes Intravenous Every 8 hours 09/22/12 1903 09/23/12 0804   09/22/12 2000   piperacillin-tazobactam (ZOSYN) IVPB 3.375 g        3.375  g 12.5 mL/hr over 240 Minutes Intravenous Every 8 hours 09/22/12 1903     09/22/12 1400   ertapenem (INVANZ) 1 g in sodium chloride 0.9 % 50 mL IVPB        1 g 100 mL/hr over 30 Minutes Intravenous  Once 09/22/12 1345 09/22/12 1630          Assessment/Plan: 1. POD#3-lap appy:  Post-op ileus, ngt in place, now with resp distress and a-fib, excessive sputum production.    --NPO and NGT  --add breathing treatments  --A-fib per cardiology, on cardizem gtt  --leukocytosis and increasing WBC, will get CXR today       LOS: 3 days    [name] 09/25/2012

## 2012-09-25 NOTE — Progress Notes (Signed)
Pt pulled out his NG tube right before being transferred to stepdown. Dr. Lilyan Punt aware.  HR finally lowered with IV lopressor and IV Ativan, but still not sustaining below 130s.  Pt understands he needs to be transferred to step down for a cardizem drip and to be monitored more closely.  He did note that his abdominal pain and nausea were much better after 1500 cc of fluid were removed via the NG tube.

## 2012-09-25 NOTE — Progress Notes (Signed)
He feels fine, abdomen okay.  He is still having intermittent atrial fib and cardiology is treating this.  Continue NG for postop ileus. Abdominal xray improved with NG.

## 2012-09-25 NOTE — Progress Notes (Addendum)
Telemetry monitor showed patient converted back to afib this evening.  Cardiology paged, and aware of his EKG showing afib with RVR.  HR staying in the 160s-170s, but occasionally switching back to NSR.  Will restart po lopressor and monitor closely.  IV lopressor may be needed if HR doesn't come down.  Pt very agitated with his NG tube however, so Ativan was also ordered.

## 2012-09-25 NOTE — Progress Notes (Addendum)
TELEMETRY: Reviewed telemetry pt in afib with RVR rate 110-120: Filed Vitals:   09/25/12 0321 09/25/12 0351 09/25/12 0400 09/25/12 0500  BP: 115/67 111/73 115/80 117/72  Pulse: 41 51 82 117  Temp:   97.6 F (36.4 C)   TempSrc:   Oral   Resp: $Remo'25 17 22 15  'usopT$ Height:      Weight:   86.7 kg (191 lb 2.2 oz)   SpO2: 77% 95% 92% 93%    Intake/Output Summary (Last 24 hours) at 09/25/12 0749 Last data filed at 09/25/12 0700  Gross per 24 hour  Intake 3271.58 ml  Output   2585 ml  Net 686.58 ml    SUBJECTIVE Patient developed N/V with ileus last pm. NG placed. Went into atrial fibrillation with RVR this am. Rates sustained 170 bpm. Transferred to step down and started on IV cardizem. Complains of SOB and congestion in throat.  LABS: Basic Metabolic Panel:  Basename 09/25/12 0459 09/24/12 0410  NA 139 137  K 3.8 3.9  CL 106 103  CO2 24 26  GLUCOSE 116* 99  BUN 19 18  CREATININE 1.80* 1.73*  CALCIUM 8.5 8.5  MG -- --  PHOS -- --   Liver Function Tests:  Basename 09/22/12 1045  AST 19  ALT 15  ALKPHOS 72  BILITOT 1.4*  PROT 6.8  ALBUMIN 3.8   No results found for this basename: LIPASE:2,AMYLASE:2 in the last 72 hours CBC:  Basename 09/25/12 0459 09/24/12 0410  WBC 15.5* 12.5*  NEUTROABS 13.8* 11.2*  HGB 9.3* 10.1*  HCT 29.3* 32.2*  MCV 76.9* 77.8*  PLT 165 179   Cardiac Enzymes:  Basename 09/23/12 1001 09/23/12 0354 09/22/12 2253  CKTOTAL -- -- --  CKMB -- -- --  CKMBINDEX -- -- --  TROPONINI <0.30 <0.30 <0.30   Thyroid Function Tests: No results found for this basename: TSH,T4TOTAL,FREET3,T3FREE,THYROIDAB in the last 72 hours  Radiology/Studies:  Dg Abd 1 View  09/25/2012  *RADIOLOGY REPORT*  Clinical Data: NG tube placement  ABDOMEN - 1 VIEW  Comparison: 09/24/2012 radiograph  Findings: NG tube projects over the left upper quadrant.  Interval decreased gastric distension. Persistent mild bowel distension is partially imaged.  Bibasilar opacities.   Lower abdomen/pelvis not included on the image.  IMPRESSION: NG tube tip projects over the stomach.   Original Report Authenticated By: Carlos Levering, M.D.    Ct Abdomen Pelvis W Contrast  09/22/2012  *RADIOLOGY REPORT*  Clinical Data: Right lower quadrant pain.  CT ABDOMEN AND PELVIS WITH CONTRAST  Technique:  Multidetector CT imaging of the abdomen and pelvis was performed following the standard protocol during bolus administration of intravenous contrast.  Contrast:  100 ml Omnipaque-300  Comparison: 04/21/2005  Findings: There is volume loss in the lower lobes bilaterally. Heart appears to be large for size.  A trace amount of left pleural fluid or thickening.  There is also a small amount of anterior perihepatic ascites and a small focus of intraperitoneal air. There appears to be stranding and inflammatory changes along the splenic flexure of the colon.  There is also fluid in this area. There is a small amount of extraluminal gas along the left colon. These findings could be secondary to diverticulitis.  There is free fluid in the pelvis and the right lower quadrant.  The appendix is grossly abnormal and thickening.  High-density material within the appendix suggests appendicoliths.  The appendix measures up to 2 cm and there is surrounding fluid in this area.  There is free air near the base of the appendix best seen on sequence #2, image 61.  There is an abnormal appearance of both kidneys.  The left kidney demonstrates areas of cortical scarring, particularly involving the left lower pole.  The delayed images demonstrate a striated enhancing pattern which could represent perfusion abnormalities or infection.  There are scattered areas of cortical scarring throughout the right kidney and there is marked dilatation of the entire right renal artery.  The proximal right renal artery measures up to 1.8 cm.  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  All of  the visualized right renal arteries are diffusely enlarged.  There is no evidence to suggest a right perinephric hematoma.  There is an indeterminate 1.1 cm low density structure in the posterior right hepatic lobe on image 13, sequence #2.  This is not suggestive for a simple cyst.  The portal venous system is patent. No gross abnormality to the spleen or pancreas.  There may be a 1.6 cm right adrenal nodule which is poorly characterized.  Limited evaluation of pelvis due to bilateral hip hardware.  The may be mild wall thickening of the urinary bladder.  There is a right hip replacement.  Severe left hip arthritis. There is a screw extending through the left femoral head and neck.  IMPRESSION: The study is positive for free fluid and free air.  There is an abnormal appearance of the appendix and the findings are highly concerning for a perforated appendicitis.  In addition, there are small foci of extraluminal air along the left colon that could represent acute diverticulitis in this area.  Abnormal appearance of both kidneys with diffuse cortical scarring and abnormal enhancement.  Findings could be related to previous scarring but cannot exclude an inflammatory or infectious etiology in the kidneys.  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  Recommend vascular surgery consultation.  Indeterminate 1.1 cm low density structure in the right hepatic lobe and question an adrenal nodule.  These areas warrant surveillance or further evaluation with MRI.  Critical Value/emergent results were called by telephone at the time of interpretation on 09/22/2012 at 1:21 p.m. to [facility], who verbally acknowledged these results.   Original Report Authenticated By: Markus Daft, M.D.    Dg Abd 2 Views  09/24/2012  *RADIOLOGY REPORT*  Clinical Data: Abdominal pain, nausea, vomiting, 2 days post  surgery for perforated appendicitis  ABDOMEN - 2 VIEW  Comparison: None Correlation:  CT abdomen and pelvis 09/22/2012  Findings: Gaseous distention of bowel in the upper abdomen. A more rounded collection of gas is seen in the central and right upper abdomen, uncertain if is distended stomach containing fluid/gas or potentially colon. Surgical drain in pelvis. Mild wall thickening of a single bowel loop in the right upper quadrant on the left lateral decubitus view. No gross free intraperitoneal air identified. Osseous structures unremarkable. Small amount of residual contrast in the pelvis. Prior right hip replacement and left femoral ORIF. Advanced osteoarthritic changes of the left hip.  IMPRESSION: Distended bowel in the upper abdomen question postoperative ileus. A more rounded gas collection in the right upper quadrant on the supine image demonstrates a prominent air-fluid level on the left lateral decubitus image, potentially representing gas and fluid within a distended stomach, less likely colon. Consider placing nasogastric  tube and repeating image to determine if this resolves. Single loop of small bowel in the upper abdomen demonstrates mild wall thickening.   Original Report Authenticated By: Lavonia Dana, M.D.    ECHO:Study Conclusions  - Left ventricle: The cavity size was normal. Wall thickness was increased in a pattern of moderate LVH. Systolic function was vigorous. The estimated ejection fraction was in the range of 65% to 70%. There was dynamic obstruction, with mid-cavity obliteration. Wall motion was normal; there were no regional wall motion abnormalities. Features are consistent with a pseudonormal left ventricular filling pattern, with concomitant abnormal relaxation and increased filling pressure (grade 2 diastolic dysfunction). - Left atrium: The atrium was moderately dilated. - Right atrium: The atrium was mildly to moderately dilated. Transthoracic echocardiography.  M-mode, complete 2D, spectral Doppler, and color Doppler. Height: Height: 180.3cm. Height: 71in. Weight: Weight: 88kg. Weight: 193.6lb. Body mass index: BMI: 27.1kg/m^2. Body surface area: BSA: 2.60m^2. Patient status: Inpatient.    PHYSICAL EXAM General: Well developed, well nourished, OG in place. On O2 by face mask Head: Normocephalic, atraumatic, sclera non-icteric, no xanthomas, nares are without discharge. Neck: Negative for carotid bruits. JVD not elevated. Lungs: Coarse upper airway sounds with rhonchi. Breathing is unlabored. Heart: IRRR S1 S2 without murmurs, rubs, or gallops.  Abdomen: Soft, non-tender, non-distended with normoactive bowel sounds. No hepatomegaly. No rebound/guarding. No obvious abdominal masses. Msk:  Strength and tone appears normal for age. Extremities: No clubbing, cyanosis or edema.  Distal pedal pulses are 2+ and equal bilaterally. Neuro: Alert and oriented X 3. Moves all extremities spontaneously. Psych:  Responds to questions appropriately with a normal affect.  ASSESSMENT AND PLAN: 1. Atrial fibrillation with RVR. Patient has history of Afib in 2009. Current episode related to stress of recent surgery and ileus. 2. Hypertensive heart disease with LVH. Vigorous systolic function. 3. S/p Appendectomy with ileus. 4. Right renal artery aneurysm. 5. HTN 6. Acute renal insufficiency 7. Respiratory distress- at increased risk of aspiration. Will check CXR.  Plan: continue IV cardizem. Will add IV lopressor for rate control. Unable to take po's at present. Check TSH. IVF. Check CXR. Will follow with you.  Active Problems:  Abnormal ECG  Hypertensive cardiomyopathy  Renal artery aneurysm    Signed, Davine Sweney [name] MD,FACC 09/25/2012 8:01 AM

## 2012-09-25 NOTE — Progress Notes (Signed)
Agree with above.  Post-operative ileus after ruptured appendicitis. Continue NG tube/ NPO Encouraged incentive spirometer Leave Foley in for one more day to monitor urine output  Continue therapeutic antibitoics  Imogene Burn. Georgette Dover, MD, [facility] Surgery  09/25/2012 10:36 AM

## 2012-09-26 ENCOUNTER — Encounter (HOSPITAL_COMMUNITY): Payer: Self-pay | Admitting: *Deleted

## 2012-09-26 LAB — BASIC METABOLIC PANEL
Calcium: 8.4 mg/dL (ref 8.4–10.5)
Creatinine, Ser: 1.52 mg/dL — ABNORMAL HIGH (ref 0.50–1.35)
GFR calc Af Amer: 59 mL/min — ABNORMAL LOW (ref >90)
GFR calc non Af Amer: 51 mL/min — ABNORMAL LOW (ref >90)

## 2012-09-26 LAB — GLUCOSE, CAPILLARY: Glucose-Capillary: 91 mg/dL (ref 70–99)

## 2012-09-26 NOTE — Progress Notes (Signed)
S/p lap appy for perforated appendicitis Post-op ileus improving Afib stable per cardiology  Tolerating clamped NG Possible d/c NG tomorrow. Transfer to floor  Paul [name]. Paul Dover, MD, [facility] Surgery  09/26/2012 12:56 PM

## 2012-09-26 NOTE — Progress Notes (Signed)
CARE MANAGEMENT NOTE 09/26/2012  Patient:  Paul [name], Paul [name]   Account Number:  [id number]  Date Initiated:  09/26/2012  Documentation initiated by:  DAVIS,RHONDA  Subjective/Objective Assessment:   PT HAD EXPAP ON DOA FOR PERFORATION OF BOWEL, A,FIB POST OP/CONTINUES TO HAVE POST OP ILEUS AND NG TUBE     Action/Plan:   FROM HOME   Anticipated DC Date:  09/29/2012   Anticipated DC Plan:  HOME/SELF CARE  In-house referral  NA      DC Planning Services  NA      PAC Choice  NA   Choice offered to / List presented to:  NA   DME arranged  NA      DME agency  NA     [employer] arranged  NA      [place] agency  NA   Status of service:  In process, will continue to follow Medicare Important Message given?  NA - LOS <3 / Initial given by admissions (If response is "NO", the following Medicare IM given date fields will be blank) Date Medicare IM given:   Date Additional Medicare IM given:    Discharge Disposition:    Per UR Regulation:  Reviewed for med. necessity/level of care/duration of stay  If discussed at [place] of Stay Meetings, dates discussed:    Comments:  [id number] Rosana Hoes, RN, BSN, CCM: CHART REVIEWED AND UPDATED.  Next chart review due on [id number]. NO DISCHARGE NEEDS PRESENT AT THIS TIME. CASE MANAGEMENT [phone]

## 2012-09-26 NOTE — Progress Notes (Signed)
Patient ID: Paul [name], male   DOB: 09-Apr-1961, 51 y.o.   MRN: [id number] 4 Days Post-Op  Subjective: Doing better today, still having some abd pain, denies nausea or vomiting, wanting NGT out.  Objective: Vital signs in last 24 hours: Temp:  [98.1 F (36.7 C)-99.5 F (37.5 C)] 99.4 F (37.4 C) (11/19 0945) Pulse Rate:  [33-114] 68  (11/19 1157) Resp:  [14-25] 20  (11/19 1157) BP: (102-149)/(63-88) 147/82 mmHg (11/19 1157) SpO2:  [85 %-100 %] 95 % (11/19 1157)    Intake/Output from previous day: 11/18 0701 - 11/19 0700 In: 3614.8 [I.V.:3314.8; IV Piggyback:250] Out: 1245 [Urine:2050; Emesis/NG output:1200; Drains:25] Intake/Output this shift: Total I/O In: 562.5 [I.V.:500; IV Piggyback:62.5] Out: 425 [Urine:300; Emesis/NG output:100; Drains:25]  General appearance: alert, cooperative and no distress GI: softer today, some bowel sounds esp on the left  Lab Results:   [facility] 09/25/12 0459 09/24/12 0410  WBC 15.5* 12.5*  HGB 9.3* 10.1*  HCT 29.3* 32.2*  PLT 165 179   BMET  Basename 09/26/12 0810 09/25/12 0459  NA 142 139  K 3.3* 3.8  CL 108 106  CO2 25 24  GLUCOSE 95 116*  BUN 13 19  CREATININE 1.52* 1.80*  CALCIUM 8.4 8.5   PT/INR No results found for this basename: LABPROT:2,INR:2 in the last 72 hours ABG No results found for this basename: PHART:2,PCO2:2,PO2:2,HCO3:2 in the last 72 hours  Studies/Results: Dg Abd 1 View  09/25/2012  *RADIOLOGY REPORT*  Clinical Data: NG tube placement  ABDOMEN - 1 VIEW  Comparison: 09/24/2012 radiograph  Findings: NG tube projects over the left upper quadrant.  Interval decreased gastric distension. Persistent mild bowel distension is partially imaged.  Bibasilar opacities.  Lower abdomen/pelvis not included on the image.  IMPRESSION: NG tube tip projects over the stomach.   Original Report Authenticated By: Carlos Levering, M.D.    Dg Chest Port 1 View  09/25/2012  *RADIOLOGY REPORT*  Clinical Data: 51 year old  male with cough, hypoxia, cardiomyopathy.  PORTABLE CHEST - 1 VIEW  Comparison: 02/17/2008 and earlier.  Findings: Portable semi upright AP view 0811 hours.  Enteric tube in place, side hole the level of the gastric body.  Lower lung volumes.  No pneumothorax or large effusion. No pulmonary edema. Left greater than right bibasilar opacity.  Stable cardiac size and mediastinal contours.  IMPRESSION: 1.  NG tube in place, side hole the level of the stomach. 2.  Lower lung volumes.  Left greater than right basilar opacity could reflect atelectasis (favored) or infection in this setting.   Original Report Authenticated By: Roselyn Reef, M.D.    Dg Abd 2 Views  09/24/2012  *RADIOLOGY REPORT*  Clinical Data: Abdominal pain, nausea, vomiting, 2 days post surgery for perforated appendicitis  ABDOMEN - 2 VIEW  Comparison: None Correlation:  CT abdomen and pelvis 09/22/2012  Findings: Gaseous distention of bowel in the upper abdomen. A more rounded collection of gas is seen in the central and right upper abdomen, uncertain if is distended stomach containing fluid/gas or potentially colon. Surgical drain in pelvis. Mild wall thickening of a single bowel loop in the right upper quadrant on the left lateral decubitus view. No gross free intraperitoneal air identified. Osseous structures unremarkable. Small amount of residual contrast in the pelvis. Prior right hip replacement and left femoral ORIF. Advanced osteoarthritic changes of the left hip.  IMPRESSION: Distended bowel in the upper abdomen question postoperative ileus. A more rounded gas collection in the right upper quadrant on  the supine image demonstrates a prominent air-fluid level on the left lateral decubitus image, potentially representing gas and fluid within a distended stomach, less likely colon. Consider placing nasogastric tube and repeating image to determine if this resolves. Single loop of small bowel in the upper abdomen demonstrates mild wall  thickening.   Original Report Authenticated By: Lavonia Dana, M.D.     Anti-infectives: Anti-infectives     Start     Dose/Rate Route Frequency Ordered Stop   09/23/12 0600   ertapenem (INVANZ) 1 g in sodium chloride 0.9 % 50 mL IVPB  Status:  Discontinued        1 g 100 mL/hr over 30 Minutes Intravenous 60 min pre-op 09/22/12 1440 09/22/12 1846   09/22/12 2200   metroNIDAZOLE (FLAGYL) IVPB 500 mg  Status:  Discontinued        500 mg 100 mL/hr over 60 Minutes Intravenous Every 8 hours 09/22/12 1903 09/23/12 0804   09/22/12 2000   piperacillin-tazobactam (ZOSYN) IVPB 3.375 g        3.375 g 12.5 mL/hr over 240 Minutes Intravenous Every 8 hours 09/22/12 1903     09/22/12 1400   ertapenem (INVANZ) 1 g in sodium chloride 0.9 % 50 mL IVPB        1 g 100 mL/hr over 30 Minutes Intravenous  Once 09/22/12 1345 09/22/12 1630          Assessment/Plan: 1. POD#4-lap appy:  Post-op ileus, ngt in place, some bowel sounds now and abd softer  --try clamping NGT today  --if NGT comes out then leave out unless develops significant nausea  --A-fib per cardiology, off cardizem gtt  --leukocytosis-recheck WBC today       LOS: 4 days    Antonios Ostrow 09/26/2012

## 2012-09-26 NOTE — Evaluation (Signed)
Physical Therapy Evaluation Patient Details Name: Paul [name] MRN: [id number] DOB: 10-Mar-1961 Today's Date: 09/26/2012 Time: 2761-8485 PT Time Calculation (min): 39 min  PT Assessment / Plan / Recommendation Clinical Impression  Pt presents with diagnostic laparoscopy and afib following sx, as well as post op illeus.  Tolerated out of chair and ambulation in hallway very well with min assist, however with increased c/o pain in abdomen as well as B knees and hips.  Pts ROM is very limited in B knees and hips.  Pt will benefit from skilled PT in acute venue to address defictis.  PT recommends ST SNF vs HHPT pending progress while in hospital.  Pt will have no assist at home, therefore raises concerns of pt safety at D/C.  Will continue to assess D/C planning.     PT Assessment  Patient needs continued PT services    Follow Up Recommendations  Home health PT;SNF    Does the patient have the potential to tolerate intense rehabilitation      Barriers to Discharge Decreased caregiver support      Equipment Recommendations  Rolling walker with 5" wheels    Recommendations for Other Services     Frequency Min 3X/week    Precautions / Restrictions Precautions Precautions: Fall Precaution Comments: Limited ROM in B knees and hips (L more so than R) Restrictions Weight Bearing Restrictions: No   Pertinent Vitals/Pain 10/10 pain      Mobility  Bed Mobility Bed Mobility: Not assessed Details for Bed Mobility Assistance: Pt in recliner when PT arrived.  Transfers Transfers: Sit to Stand;Stand to Sit Sit to Stand: 4: Min assist;With upper extremity assist;With armrests;From chair/3-in-1 Stand to Sit: 4: Min assist;With upper extremity assist;With armrests;To chair/3-in-1 Details for Transfer Assistance: Assist to rise and steady and to ensure controlled descent with cues for hand placement when sitting/standing.  Ambulation/Gait Ambulation/Gait Assistance: 4: Min  assist Ambulation Distance (Feet): 400 Feet Assistive device: Rolling walker Ambulation/Gait Assistance Details: Assist to steady with cues for sequencing/technique with RW and to maintain positioning inside of RW.   Gait Pattern: Step-through pattern;Decreased stride length Gait velocity: decreased Stairs: No Wheelchair Mobility Wheelchair Mobility: No    Shoulder Instructions     Exercises Other Exercises Other Exercises: Educated pt on quad sets and performing self AAROM for knee while in sitting.    PT Diagnosis: Difficulty walking;Generalized weakness;Acute pain  PT Problem List: Decreased strength;Decreased range of motion;Decreased activity tolerance;Decreased balance;Decreased mobility;Decreased knowledge of use of DME;Decreased safety awareness;Decreased knowledge of precautions;Pain PT Treatment Interventions: DME instruction;Gait training;Stair training;Functional mobility training;Therapeutic activities;Therapeutic exercise;Balance training;Patient/family education   PT Goals Acute Rehab PT Goals PT Goal Formulation: With patient Time For Goal Achievement: 10/10/12 Potential to Achieve Goals: Good Pt will go Supine/Side to Sit: with supervision PT Goal: Supine/Side to Sit - Progress: Goal set today Pt will go Sit to Supine/Side: with supervision PT Goal: Sit to Supine/Side - Progress: Goal set today Pt will go Sit to Stand: with supervision PT Goal: Sit to Stand - Progress: Goal set today Pt will go Stand to Sit: with supervision PT Goal: Stand to Sit - Progress: Goal set today Pt will Ambulate: >150 feet;with modified independence;with least restrictive assistive device PT Goal: Ambulate - Progress: Goal set today Pt will Go Up / Down Stairs: 3-5 stairs;with supervision;with rail(s) PT Goal: Up/Down Stairs - Progress: Goal set today Pt will Perform Home Exercise Program: with supervision, verbal cues required/provided PT Goal: Perform Home Exercise Program -  Progress: Goal  set today  Visit Information  Last PT Received On: 09/26/12 Assistance Needed: +1    Subjective Data  Subjective: My pain is a 12 Patient Stated Goal: to get something done about my knees/hips    Prior Functioning  Home Living Lives With: Alone Type of Home: House Home Access: Stairs to enter Entrance Stairs-Number of Steps: 2-3 Entrance Stairs-Rails: None Home Layout: One level Bathroom Shower/Tub: Chiropodist: Standard Home Adaptive Equipment: Crutches;Straight cane Prior Function Level of Independence: Independent with assistive device(s) Able to Take Stairs?: Yes Driving: Yes Communication Communication: No difficulties    Cognition  Overall Cognitive Status: Appears within functional limits for tasks assessed/performed Arousal/Alertness: Awake/alert Orientation Level: Appears intact for tasks assessed Behavior During Session: [facility] for tasks performed    Extremity/Trunk Assessment Right Lower Extremity Assessment RLE ROM/Strength/Tone: Deficits RLE ROM/Strength/Tone Deficits: knee ROM: ext 0, flex approx 40-50 at most, hip IR and ER limited to approx 20 deg RLE Sensation: WFL - Light Touch RLE Coordination: WFL - gross/fine motor Left Lower Extremity Assessment LLE ROM/Strength/Tone: Deficits LLE ROM/Strength/Tone Deficits: knee ROM: ext 0 (notable quad weakness on L vs R), flex approx 40 deg at most, hip IR and ER limited to approx 20 deg.  LLE Sensation: WFL - Light Touch LLE Coordination: WFL - gross/fine motor Trunk Assessment Trunk Assessment: Normal   Balance    End of Session PT - End of Session Equipment Utilized During Treatment: Gait belt Activity Tolerance: Patient limited by pain Patient left: in chair;with call bell/phone within reach Nurse Communication: Mobility status  GP     Page, Betha Loa 09/26/2012, 4:28 PM

## 2012-09-26 NOTE — Progress Notes (Signed)
TELEMETRY: Reviewed telemetry pt converted from afib to junctional rhythm at 2 am today, rate 70s: Filed Vitals:   09/25/12 1900 09/25/12 2000 09/26/12 0000 09/26/12 0400  BP: 126/80 137/76 131/82 128/70  Pulse: 69 95 95   Temp:  99.5 F (37.5 C) 98.7 F (37.1 C) 98.1 F (36.7 C)  TempSrc:  Oral Oral Oral  Resp: $Remo'21 20 25 17  'RpyoY$ Height:      Weight:      SpO2: 97% 85% 96%     Intake/Output Summary (Last 24 hours) at 09/26/12 0624 Last data filed at 09/26/12 0554  Gross per 24 hour  Intake 3489.83 ml  Output   2775 ml  Net 714.83 ml    SUBJECTIVE Denies SOB or chest pain today.  LABS: Basic Metabolic Panel:  Basename 09/25/12 0813 09/25/12 0459 09/24/12 0410  NA -- 139 137  K -- 3.8 3.9  CL -- 106 103  CO2 -- 24 26  GLUCOSE -- 116* 99  BUN -- 19 18  CREATININE -- 1.80* 1.73*  CALCIUM -- 8.5 8.5  MG 1.7 -- --  PHOS -- -- --   Liver Function Tests: No results found for this basename: AST:2,ALT:2,ALKPHOS:2,BILITOT:2,PROT:2,ALBUMIN:2 in the last 72 hours No results found for this basename: LIPASE:2,AMYLASE:2 in the last 72 hours CBC:  Basename 09/25/12 0459 09/24/12 0410  WBC 15.5* 12.5*  NEUTROABS 13.8* 11.2*  HGB 9.3* 10.1*  HCT 29.3* 32.2*  MCV 76.9* 77.8*  PLT 165 179   Cardiac Enzymes:  Basename 09/23/12 1001  CKTOTAL --  CKMB --  CKMBINDEX --  TROPONINI <0.30   Thyroid Function Tests:  Basename 09/25/12 0813  TSH 0.570  T4TOTAL --  T3FREE --  THYROIDAB --   Magnesium: 1.7  Radiology/Studies:  Dg Abd 1 View  09/25/2012  *RADIOLOGY REPORT*  Clinical Data: NG tube placement  ABDOMEN - 1 VIEW  Comparison: 09/24/2012 radiograph  Findings: NG tube projects over the left upper quadrant.  Interval decreased gastric distension. Persistent mild bowel distension is partially imaged.  Bibasilar opacities.  Lower abdomen/pelvis not included on the image.  IMPRESSION: NG tube tip projects over the stomach.   Original Report Authenticated By: Carlos Levering, M.D.    Ct Abdomen Pelvis W Contrast  09/22/2012  *RADIOLOGY REPORT*  Clinical Data: Right lower quadrant pain.  CT ABDOMEN AND PELVIS WITH CONTRAST  Technique:  Multidetector CT imaging of the abdomen and pelvis was performed following the standard protocol during bolus administration of intravenous contrast.  Contrast:  100 ml Omnipaque-300  Comparison: 04/21/2005  Findings: There is volume loss in the lower lobes bilaterally. Heart appears to be large for size.  A trace amount of left pleural fluid or thickening.  There is also a small amount of anterior perihepatic ascites and a small focus of intraperitoneal air. There appears to be stranding and inflammatory changes along the splenic flexure of the colon.  There is also fluid in this area. There is a small amount of extraluminal gas along the left colon. These findings could be secondary to diverticulitis.  There is free fluid in the pelvis and the right lower quadrant.  The appendix is grossly abnormal and thickening.  High-density material within the appendix suggests appendicoliths.  The appendix measures up to 2 cm and there is surrounding fluid in this area. There is free air near the base of the appendix best seen on sequence #2, image 61.  There is an abnormal appearance of both kidneys.  The  left kidney demonstrates areas of cortical scarring, particularly involving the left lower pole.  The delayed images demonstrate a striated enhancing pattern which could represent perfusion abnormalities or infection.  There are scattered areas of cortical scarring throughout the right kidney and there is marked dilatation of the entire right renal artery.  The proximal right renal artery measures up to 1.8 cm.  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  All of the visualized right renal arteries are diffusely enlarged.  There is no evidence to suggest a right perinephric hematoma.  There is an  indeterminate 1.1 cm low density structure in the posterior right hepatic lobe on image 13, sequence #2.  This is not suggestive for a simple cyst.  The portal venous system is patent. No gross abnormality to the spleen or pancreas.  There may be a 1.6 cm right adrenal nodule which is poorly characterized.  Limited evaluation of pelvis due to bilateral hip hardware.  The may be mild wall thickening of the urinary bladder.  There is a right hip replacement.  Severe left hip arthritis. There is a screw extending through the left femoral head and neck.  IMPRESSION: The study is positive for free fluid and free air.  There is an abnormal appearance of the appendix and the findings are highly concerning for a perforated appendicitis.  In addition, there are small foci of extraluminal air along the left colon that could represent acute diverticulitis in this area.  Abnormal appearance of both kidneys with diffuse cortical scarring and abnormal enhancement.  Findings could be related to previous scarring but cannot exclude an inflammatory or infectious etiology in the kidneys.  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  Recommend vascular surgery consultation.  Indeterminate 1.1 cm low density structure in the right hepatic lobe and question an adrenal nodule.  These areas warrant surveillance or further evaluation with MRI.  Critical Value/emergent results were called by telephone at the time of interpretation on 09/22/2012 at 1:21 p.m. to [facility], who verbally acknowledged these results.   Original Report Authenticated By: Markus Daft, M.D.    DG CHEST PORT 1 VIEW [[id number]] Resulted:09/25/12 0911 Order Status:Completed Updated:09/25/12 0912 Narrative: *RADIOLOGY REPORT*  Clinical Data: 51 year old male with cough, hypoxia, cardiomyopathy.  PORTABLE CHEST - 1 VIEW  Comparison:  02/17/2008 and earlier.  Findings: Portable semi upright AP view 0811 hours. Enteric tube in place, side hole the level of the gastric body. Lower lung volumes. No pneumothorax or large effusion. No pulmonary edema. Left greater than right bibasilar opacity. Stable cardiac size and mediastinal contours.  IMPRESSION: 1. NG tube in place, side hole the level of the stomach. 2. Lower lung volumes. Left greater than right basilar opacity could reflect atelectasis (favored) or infection in this setting.   Original Report Authenticated By: Roselyn Reef, M.D.   ECHO:Study Conclusions  - Left ventricle: The cavity size was normal. Wall thickness was increased in a pattern of moderate LVH. Systolic function was vigorous. The estimated ejection fraction was in the range of 65% to 70%. There was dynamic obstruction, with mid-cavity obliteration. Wall motion was normal; there were no regional wall motion abnormalities. Features are consistent with a pseudonormal left ventricular filling pattern, with concomitant abnormal relaxation and increased filling pressure (grade 2 diastolic dysfunction). - Left atrium: The atrium was  moderately dilated. - Right atrium: The atrium was mildly to moderately dilated. Transthoracic echocardiography. M-mode, complete 2D, spectral Doppler, and color Doppler. Height: Height: 180.3cm. Height: 71in. Weight: Weight: 88kg. Weight: 193.6lb. Body mass index: BMI: 27.1kg/m^2. Body surface area: BSA: 2.4m^2. Patient status: Inpatient.    PHYSICAL EXAM General: Well developed, well nourished, OG in place. [place] O2 Head: Normocephalic, atraumatic, sclera non-icteric, no xanthomas, nares are without discharge. Neck: Negative for carotid bruits. JVD not elevated. Lungs: Lungs are clear. Breathing is unlabored. Heart: RRR S1 S2 without murmurs, rubs, or gallops.  Abdomen: Soft, non-tender, non-distended with normoactive bowel sounds. No hepatomegaly. No  rebound/guarding. No obvious abdominal masses. Msk:  Strength and tone appears normal for age. Extremities: No clubbing, cyanosis or edema.  Distal pedal pulses are 2+ and equal bilaterally. Neuro: Alert and oriented X 3. Moves all extremities spontaneously. Psych:  Responds to questions appropriately with a normal affect.  ASSESSMENT AND PLAN: 1. Atrial fibrillation with RVR. Patient has history of Afib in 2009. Current episode related to stress of recent surgery and ileus. Now converted back to junctional rhythm. Will DC IV cardizem. Continue IV lopressor until able to take po then convert to po. 2. Hypertensive heart disease with LVH. Vigorous systolic function. 3. S/p Appendectomy with ileus. 4. Right renal artery aneurysm. 5. HTN 6. Acute renal insufficiency- labs pending today. Continue IV hydration.   Active Problems:  Abnormal ECG  Hypertensive cardiomyopathy  Renal artery aneurysm    Signed, Rykin Route [name] MD,FACC 09/26/2012 6:24 AM

## 2012-09-27 DIAGNOSIS — K37 Unspecified appendicitis: Secondary | ICD-10-CM

## 2012-09-27 LAB — URINALYSIS, ROUTINE W REFLEX MICROSCOPIC
Glucose, UA: 100 mg/dL — AB
Nitrite: NEGATIVE
Specific Gravity, Urine: 1.029 (ref 1.005–1.030)
pH: 6 (ref 5.0–8.0)

## 2012-09-27 LAB — BASIC METABOLIC PANEL
BUN: 13 mg/dL (ref 6–23)
Calcium: 9.1 mg/dL (ref 8.4–10.5)
Chloride: 105 mEq/L (ref 96–112)
Creatinine, Ser: 1.4 mg/dL — ABNORMAL HIGH (ref 0.50–1.35)
GFR calc non Af Amer: 56 mL/min — ABNORMAL LOW (ref >90)
Glucose, Bld: 111 mg/dL — ABNORMAL HIGH (ref 70–99)
Potassium: 3.4 mEq/L — ABNORMAL LOW (ref 3.5–5.1)

## 2012-09-27 LAB — CBC
Platelets: 248 10*3/uL (ref 150–400)
RDW: 13 % (ref 11.5–15.5)
WBC: 12.7 10*3/uL — ABNORMAL HIGH (ref 4.0–10.5)

## 2012-09-27 LAB — URINE MICROSCOPIC-ADD ON

## 2012-09-27 NOTE — Progress Notes (Signed)
Patient accidentally pulled out NG tube. Will notify physician in am was a note dr Georgette Dover noted possible dc of NG 11/20/. Lorne Winkels RN

## 2012-09-27 NOTE — Progress Notes (Signed)
Patient ID: Paul [name], male   DOB: 1961-05-05, 50 y.o.   MRN: [id number]  5 Days Post-Op  Subjective: Doing better today, no abdominal pain, + BM, BS, flatus. Wants to eat. Objective: Vital signs in last 24 hours: Temp:  [98 F (36.7 C)-99.6 F (37.6 C)] 98.6 F (37 C) (11/20 0618) Pulse Rate:  [64-83] 83  (11/20 0618) Resp:  [20-23] 20  (11/20 0618) BP: (147-164)/(71-82) 156/78 mmHg (11/20 0618) SpO2:  [94 %-100 %] 98 % (11/20 0618) Last BM Date: 09/27/12 (per pt)  Intake/Output from previous day: 11/19 0701 - 11/20 0700 In: 2525 [I.V.:2375; IV Piggyback:150] Out: 2156 [Urine:1995; Emesis/NG output:100; Drains:60; Stool:1] Intake/Output this shift:  General appearance: A/A/O NAD Chest: CTA Cardiac: RRR Abdomen: staples in place, soft, non tender, slight distention, + BS, BM,flatus. Extremities: warm, + pulses, no edema or tenderness. Labs: Wbc has trended down, H&H improved. Bun stable creatine improved. Hypokalemia improving.     Lab Results:   Basename 09/27/12 0423 09/25/12 0459  WBC 12.7* 15.5*  HGB 11.4* 9.3*  HCT 35.4* 29.3*  PLT 248 165   BMET  Basename 09/27/12 0423 09/26/12 0810  NA 142 142  K 3.4* 3.3*  CL 105 108  CO2 26 25  GLUCOSE 111* 95  BUN 13 13  CREATININE 1.40* 1.52*  CALCIUM 9.1 8.4   PT/INR No results found for this basename: LABPROT:2,INR:2 in the last 72 hours ABG No results found for this basename: PHART:2,PCO2:2,PO2:2,HCO3:2 in the last 72 hours  Studies/Results: No results found.  Anti-infectives: Anti-infectives     Start     Dose/Rate Route Frequency Ordered Stop   09/23/12 0600   ertapenem (INVANZ) 1 g in sodium chloride 0.9 % 50 mL IVPB  Status:  Discontinued        1 g 100 mL/hr over 30 Minutes Intravenous 60 min pre-op 09/22/12 1440 09/22/12 1846   09/22/12 2200   metroNIDAZOLE (FLAGYL) IVPB 500 mg  Status:  Discontinued        500 mg 100 mL/hr over 60 Minutes Intravenous Every 8 hours 09/22/12 1903 09/23/12  0804   09/22/12 2000   piperacillin-tazobactam (ZOSYN) IVPB 3.375 g        3.375 g 12.5 mL/hr over 240 Minutes Intravenous Every 8 hours 09/22/12 1903     09/22/12 1400   ertapenem (INVANZ) 1 g in sodium chloride 0.9 % 50 mL IVPB        1 g 100 mL/hr over 30 Minutes Intravenous  Once 09/22/12 1345 09/22/12 1630          Assessment/Plan:  Patient Active Problem List  Diagnosis  . DERMATOPHYTOSIS OF NAIL  . HYPERLIPIDEMIA  . ERECTILE DYSFUNCTION  . TOBACCO USE  . Other, mixed, or unspecified nondependent drug abuse, unspecified  . HYPERTENSION, BENIGN SYSTEMIC  . CARDIOMYOPATHY, HYPERTROPHIC  . ATRIAL FIBRILLATION WITH RAPID VENTRICULAR RESPONSE  . RHINITIS, ALLERGIC NOS  . GERD  . RENAL INSUFFICIENCY, CHRONIC  . SHOULDER PAIN, LEFT  . Pain in Joint, Pelvic Region and Thigh  . KNEE PAIN, RIGHT  . BACK PAIN  . Unspecified disorders of bursae and tendons in shoulder region  . ABNORMALITY OF GAIT  . Cough  . URI (upper respiratory infection)  . Stomach pain  . Bilateral hip pain  . Abnormal ECG  . Hypertensive cardiomyopathy  . Renal artery aneurysm    Post-op ileus (resolved)    A-fib per cardiology, off cardizem gtt (stable)   Leukocytosis (improving)  Plan: 1.  Will try trial of clears  2. Ambulate/IS 3. Continue IV abx for now (will discuss with Dr. Georgette Dover) 4. Staples to remain in until at least 10/02/12        LOS: 5 days    [name] [facility] Surgery Pager # [phone]  09/27/2012

## 2012-09-27 NOTE — Progress Notes (Signed)
Found empty bag of special K chips under pt's sheets. Pt is currently NPO. Pt denied knowledge of where the empty bag came from.

## 2012-09-27 NOTE — Progress Notes (Signed)
Pt reported having bleeding from tip of his penis. Noted small blood stain on bathroom floor. NP made aware. Will continue to monitor pt.

## 2012-09-27 NOTE — Progress Notes (Signed)
Pt currently on clear liquid diet. Pt requested to have diet advanced to regular. Pt educated about importance of advancing diet slowly as well as on risk associated with advancing diet too soon. NP made aware. No new orders received. Will continue to monitor pt.

## 2012-09-27 NOTE — Progress Notes (Signed)
TELEMETRY: N/A Filed Vitals:   09/26/12 2000 09/26/12 2255 09/27/12 0220 09/27/12 0618  BP: 163/82 164/79 148/71 156/78  Pulse: 76 77 64 83  Temp: 98.9 F (37.2 C) 99.6 F (37.6 C) 98 F (36.7 C) 98.6 F (37 C)  TempSrc: Oral Oral Oral Oral  Resp: $Remo'20 20 20 20  'RVWVk$ Height:      Weight:      SpO2: 95% 96% 94% 98%    Intake/Output Summary (Last 24 hours) at 09/27/12 0704 Last data filed at 09/27/12 0600  Gross per 24 hour  Intake   2525 ml  Output   2156 ml  Net    369 ml    SUBJECTIVE Denies SOB or chest pain today. Pulled NG out. Reports good BM and urinating well.  LABS: Basic Metabolic Panel:  Basename 09/27/12 0423 09/26/12 0810 09/25/12 0813  NA 142 142 --  K 3.4* 3.3* --  CL 105 108 --  CO2 26 25 --  GLUCOSE 111* 95 --  BUN 13 13 --  CREATININE 1.40* 1.52* --  CALCIUM 9.1 8.4 --  MG -- -- 1.7  PHOS -- -- --   CBC:  Basename 09/27/12 0423 09/25/12 0459  WBC 12.7* 15.5*  NEUTROABS -- 13.8*  HGB 11.4* 9.3*  HCT 35.4* 29.3*  MCV 75.3* 76.9*  PLT 248 165   Thyroid Function Tests:  Basename 09/25/12 0813  TSH 0.570  T4TOTAL --  T3FREE --  THYROIDAB --   Magnesium: 1.7  Radiology/Studies:  Dg Abd 1 View  09/25/2012  *RADIOLOGY REPORT*  Clinical Data: NG tube placement  ABDOMEN - 1 VIEW  Comparison: 09/24/2012 radiograph  Findings: NG tube projects over the left upper quadrant.  Interval decreased gastric distension. Persistent mild bowel distension is partially imaged.  Bibasilar opacities.  Lower abdomen/pelvis not included on the image.  IMPRESSION: NG tube tip projects over the stomach.   Original Report Authenticated By: Carlos Levering, M.D.    Ct Abdomen Pelvis W Contrast  09/22/2012  *RADIOLOGY REPORT*  Clinical Data: Right lower quadrant pain.  CT ABDOMEN AND PELVIS WITH CONTRAST  Technique:  Multidetector CT imaging of the abdomen and pelvis was performed following the standard protocol during bolus administration of intravenous contrast.   Contrast:  100 ml Omnipaque-300  Comparison: 04/21/2005  Findings: There is volume loss in the lower lobes bilaterally. Heart appears to be large for size.  A trace amount of left pleural fluid or thickening.  There is also a small amount of anterior perihepatic ascites and a small focus of intraperitoneal air. There appears to be stranding and inflammatory changes along the splenic flexure of the colon.  There is also fluid in this area. There is a small amount of extraluminal gas along the left colon. These findings could be secondary to diverticulitis.  There is free fluid in the pelvis and the right lower quadrant.  The appendix is grossly abnormal and thickening.  High-density material within the appendix suggests appendicoliths.  The appendix measures up to 2 cm and there is surrounding fluid in this area. There is free air near the base of the appendix best seen on sequence #2, image 61.  There is an abnormal appearance of both kidneys.  The left kidney demonstrates areas of cortical scarring, particularly involving the left lower pole.  The delayed images demonstrate a striated enhancing pattern which could represent perfusion abnormalities or infection.  There are scattered areas of cortical scarring throughout the right kidney and there is  marked dilatation of the entire right renal artery.  The proximal right renal artery measures up to 1.8 cm.  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  All of the visualized right renal arteries are diffusely enlarged.  There is no evidence to suggest a right perinephric hematoma.  There is an indeterminate 1.1 cm low density structure in the posterior right hepatic lobe on image 13, sequence #2.  This is not suggestive for a simple cyst.  The portal venous system is patent. No gross abnormality to the spleen or pancreas.  There may be a 1.6 cm right adrenal nodule which is poorly characterized.  Limited evaluation of  pelvis due to bilateral hip hardware.  The may be mild wall thickening of the urinary bladder.  There is a right hip replacement.  Severe left hip arthritis. There is a screw extending through the left femoral head and neck.  IMPRESSION: The study is positive for free fluid and free air.  There is an abnormal appearance of the appendix and the findings are highly concerning for a perforated appendicitis.  In addition, there are small foci of extraluminal air along the left colon that could represent acute diverticulitis in this area.  Abnormal appearance of both kidneys with diffuse cortical scarring and abnormal enhancement.  Findings could be related to previous scarring but cannot exclude an inflammatory or infectious etiology in the kidneys.  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  Recommend vascular surgery consultation.  Indeterminate 1.1 cm low density structure in the right hepatic lobe and question an adrenal nodule.  These areas warrant surveillance or further evaluation with MRI.  Critical Value/emergent results were called by telephone at the time of interpretation on 09/22/2012 at 1:21 p.m. to [facility], who verbally acknowledged these results.   Original Report Authenticated By: Markus Daft, M.D.    DG CHEST PORT 1 VIEW [[id number]] Resulted:09/25/12 0911 Order Status:Completed Updated:09/25/12 0912 Narrative: *RADIOLOGY REPORT*  Clinical Data: 51 year old male with cough, hypoxia, cardiomyopathy.  PORTABLE CHEST - 1 VIEW  Comparison: 02/17/2008 and earlier.  Findings: Portable semi upright AP view 0811 hours. Enteric tube in place, side hole the level of the gastric body. Lower lung volumes. No pneumothorax or large effusion. No pulmonary edema. Left greater than right bibasilar opacity. Stable cardiac size and mediastinal contours.  IMPRESSION: 1. NG  tube in place, side hole the level of the stomach. 2. Lower lung volumes. Left greater than right basilar opacity could reflect atelectasis (favored) or infection in this setting.   Original Report Authenticated By: Roselyn Reef, M.D.   ECHO:Study Conclusions  - Left ventricle: The cavity size was normal. Wall thickness was increased in a pattern of moderate LVH. Systolic function was vigorous. The estimated ejection fraction was in the range of 65% to 70%. There was dynamic obstruction, with mid-cavity obliteration. Wall motion was normal; there were no regional wall motion abnormalities. Features are consistent with a pseudonormal left ventricular filling pattern, with concomitant abnormal relaxation and increased filling pressure (grade 2 diastolic dysfunction). - Left atrium: The atrium was moderately dilated. - Right atrium: The atrium was mildly to moderately dilated. Transthoracic echocardiography. M-mode, complete 2D, spectral Doppler, and color Doppler. Height: Height: 180.3cm. Height: 71in. Weight: Weight: 88kg. Weight: 193.6lb. Body mass index: BMI: 27.1kg/m^2. Body surface area: BSA: 2.29m^2. Patient status:  Inpatient.    PHYSICAL EXAM General: Well developed, well nourished, NAD Head: Normocephalic, atraumatic, sclera non-icteric, no xanthomas, nares are without discharge. Neck: Negative for carotid bruits. JVD not elevated. Lungs: Lungs are clear. Breathing is unlabored. Heart: RRR S1 S2 without murmurs, rubs, or gallops.  Abdomen: Soft, non-tender, non-distended with normoactive bowel sounds. Drain in place Msk:  Strength and tone appears normal for age. Extremities: No clubbing, cyanosis or edema.  Distal pedal pulses are 2+ and equal bilaterally. Neuro: Alert and oriented X 3. Moves all extremities spontaneously. Psych:  Responds to questions appropriately with a normal affect.  ASSESSMENT AND PLAN: 1. Atrial fibrillation with RVR. Patient has history of  Afib in 2009. Current episode related to stress of recent surgery and ileus. Now converted back to sinus rhythm. If patient's diet advanced to po would DC IV lopressor and begin metoprolol 25 mg bid. 2. Hypertensive heart disease with LVH. Vigorous systolic function. 3. S/p Appendectomy with ileus-resolving. 4. Right renal artery aneurysm. 5. HTN 6. Acute renal insufficiency-improving. 7. Hypokalemia-replete.   Active Problems:  ATRIAL FIBRILLATION WITH RAPID VENTRICULAR RESPONSE  Abnormal ECG  Hypertensive cardiomyopathy  Renal artery aneurysm    Signed, Baudelia Schroepfer [name] MD,FACC 09/27/2012 7:04 AM

## 2012-09-27 NOTE — Progress Notes (Signed)
Ileus resolved Advance diet Saline lock PO pain meds  May remove drain in next day or two.  Imogene Burn. Georgette Dover, MD, [facility] Surgery  09/27/2012 1:39 PM

## 2012-09-28 ENCOUNTER — Inpatient Hospital Stay (HOSPITAL_COMMUNITY): Payer: Medicare HMO

## 2012-09-28 LAB — C3 COMPLEMENT: C3 Complement: 117 mg/dL (ref 90–180)

## 2012-09-28 LAB — BASIC METABOLIC PANEL
Calcium: 8.8 mg/dL (ref 8.4–10.5)
Chloride: 107 mEq/L (ref 96–112)
Creatinine, Ser: 1.55 mg/dL — ABNORMAL HIGH (ref 0.50–1.35)
GFR calc Af Amer: 57 mL/min — ABNORMAL LOW (ref >90)
Sodium: 145 mEq/L (ref 135–145)

## 2012-09-28 LAB — CLOSTRIDIUM DIFFICILE BY PCR: Toxigenic C. Difficile by PCR: NEGATIVE

## 2012-09-28 LAB — URINALYSIS, ROUTINE W REFLEX MICROSCOPIC
Leukocytes, UA: NEGATIVE
Nitrite: NEGATIVE
Specific Gravity, Urine: 1.031 — ABNORMAL HIGH (ref 1.005–1.030)
Urobilinogen, UA: 0.2 mg/dL (ref 0.0–1.0)

## 2012-09-28 LAB — CBC
Platelets: 306 10*3/uL (ref 150–400)
RBC: 5.06 MIL/uL (ref 4.22–5.81)
RDW: 13 % (ref 11.5–15.5)
WBC: 11.4 10*3/uL — ABNORMAL HIGH (ref 4.0–10.5)

## 2012-09-28 MED ORDER — KCL IN DEXTROSE-NACL 20-5-0.9 MEQ/L-%-% IV SOLN
INTRAVENOUS | Status: DC
  Administered 2012-09-28 (×2): via INTRAVENOUS
  Administered 2012-09-29: 125 mL/h via INTRAVENOUS
  Administered 2012-09-29 – 2012-10-03 (×9): via INTRAVENOUS
  Filled 2012-09-28 (×15): qty 1000

## 2012-09-28 MED ORDER — METOPROLOL TARTRATE 25 MG PO TABS
25.0000 mg | ORAL_TABLET | Freq: Two times a day (BID) | ORAL | Status: DC
  Administered 2012-09-28 – 2012-10-04 (×13): 25 mg via ORAL
  Filled 2012-09-28 (×14): qty 1

## 2012-09-28 NOTE — Progress Notes (Signed)
Patient ID: Paul [name], male   DOB: 15-May-1961, 51 y.o.   MRN: [id number]  6 Days Post-Op  Subjective: Increased abdominal pain and distention this am, no N/V; but has had increased bouts of diarrhea.(C-Diff panel pend) Also continues to have c/o of painful urination; "seeing blood in urine" ? Cause of this UA does not indicate infectious process, + for blood in urine and protein. (will ask nephrology to see)? Secondary to CKD/HTN vs pylonephritis. [place] was improving, WBC trending down on Zosyn. Afebrile and no flank pain.  Objective: Vital signs in last 24 hours: Temp:  [98.4 F (36.9 C)-98.7 F (37.1 C)] 98.4 F (36.9 C) (11/21 0612) Pulse Rate:  [68-78] 68  (11/21 0612) Resp:  [18-20] 20  (11/21 0612) BP: (132-148)/(70-77) 148/77 mmHg (11/21 0612) SpO2:  [97 %-99 %] 97 % (11/21 0612) Last BM Date: 09/27/12  Intake/Output from previous day: 11/20 0701 - 11/21 0700 In: 2850.4 [P.O.:1200; I.V.:1350.4; IV Piggyback:300] Out: 2230 [Urine:1800; Drains:430] Intake/Output this shift:  General appearance: A/A/O mild distress Chest: CTA Cardiac: RRR Abdomen: staples in place, distended, non tender,+ BS, BM,(diarrhea) flatus. JP drain output (420ml straw colored fluid) Extremities: warm, + pulses, no edema or tenderness. Labs: pending VSS,afebrile    Lab Results:   [facility] 09/27/12 0423  WBC 12.7*  HGB 11.4*  HCT 35.4*  PLT 248   BMET  Basename 09/27/12 0423 09/26/12 0810  NA 142 142  K 3.4* 3.3*  CL 105 108  CO2 26 25  GLUCOSE 111* 95  BUN 13 13  CREATININE 1.40* 1.52*  CALCIUM 9.1 8.4   PT/INR No results found for this basename: LABPROT:2,INR:2 in the last 72 hours ABG No results found for this basename: PHART:2,PCO2:2,PO2:2,HCO3:2 in the last 72 hours  Studies/Results: No results found.  Anti-infectives: Anti-infectives     Start     Dose/Rate Route Frequency Ordered Stop   09/23/12 0600   ertapenem (INVANZ) 1 g in sodium chloride 0.9 % 50 mL  IVPB  Status:  Discontinued        1 g 100 mL/hr over 30 Minutes Intravenous 60 min pre-op 09/22/12 1440 09/22/12 1846   09/22/12 2200   metroNIDAZOLE (FLAGYL) IVPB 500 mg  Status:  Discontinued        500 mg 100 mL/hr over 60 Minutes Intravenous Every 8 hours 09/22/12 1903 09/23/12 0804   09/22/12 2000   piperacillin-tazobactam (ZOSYN) IVPB 3.375 g        3.375 g 12.5 mL/hr over 240 Minutes Intravenous Every 8 hours 09/22/12 1903     09/22/12 1400   ertapenem (INVANZ) 1 g in sodium chloride 0.9 % 50 mL IVPB        1 g 100 mL/hr over 30 Minutes Intravenous  Once 09/22/12 1345 09/22/12 1630          Assessment/Plan:  Patient Active Problem List  Diagnosis  . DERMATOPHYTOSIS OF NAIL  . HYPERLIPIDEMIA  . ERECTILE DYSFUNCTION  . TOBACCO USE  . Other, mixed, or unspecified nondependent drug abuse, unspecified  . HYPERTENSION, BENIGN SYSTEMIC  . CARDIOMYOPATHY, HYPERTROPHIC  . ATRIAL FIBRILLATION WITH RAPID VENTRICULAR RESPONSE  . RHINITIS, ALLERGIC NOS  . GERD  . RENAL INSUFFICIENCY, CHRONIC  . SHOULDER PAIN, LEFT  . Pain in Joint, Pelvic Region and Thigh  . KNEE PAIN, RIGHT  . BACK PAIN  . Unspecified disorders of bursae and tendons in shoulder region  . ABNORMALITY OF GAIT  . Cough  . URI (upper respiratory infection)  .  Stomach pain  . Bilateral hip pain  . Abnormal ECG  . Hypertensive cardiomyopathy  . Renal artery aneurysm    Post-op ileus    A-fib per cardiology, off cardizem gtt (stable)   Leukocytosis (improving) Painful urination + protein and blood in UA ? Cause  Plan: 1. Will hold diet for now; obtain abdominal films  2. IVF, pain management.  3. recheck labs this morning 4. Stool for C-Diff 5. Nephrology consult 6. Continue IV abx for now (will discuss with Dr. Georgette Dover) 7. Staples to remain in until at least 10/02/12        LOS: 6 days    [name] [facility] Pager # [phone]  09/28/2012

## 2012-09-28 NOTE — Progress Notes (Signed)
Patient had multiple episodes of loose stool throughout the night. Patient is now sleeping will continue to monitor for loose stool. Patient expressed some concern of burning during urination that began on last urination. Advised will bring to dr Georgette Dover attention in the AM. Janeth Rase, RN

## 2012-09-28 NOTE — Progress Notes (Signed)
Distended, but having diarrhea.  Rule out C. Diff Repeat labs Back off to clears Abnormal U/A - proteinuria; renal consult  Imogene Burn. Georgette Dover, MD, [facility]  09/28/2012 10:35 AM

## 2012-09-28 NOTE — Progress Notes (Signed)
PT Cancellation Note  Patient Details Name: Paul [name] MRN: [id number] DOB: 03-23-1961   Cancelled Treatment:     Attempted PT tx session. Pt declined to participate at this time due to pain. Will check back later today as schedule permits. Thanks.    Weston Anna [facility] 09/28/2012, 10:26 AM [phone]

## 2012-09-28 NOTE — Progress Notes (Signed)
Physical Therapy Treatment Patient Details Name: Paul [name] MRN: [id number] DOB: 30-Apr-1961 Today's Date: 09/28/2012 Time: 3329-5188 PT Time Calculation (min): 37 min  PT Assessment / Plan / Recommendation Comments on Treatment Session  Pt continues to have increased pain in abdomen and in joints with ROM activities.  Educated on performing ER stretching while in bed.  Pt states that he can have neighbors check on him intermittently, therefore feel that he can D/C home safely.     Follow Up Recommendations  Home health PT;Supervision - Intermittent     Does the patient have the potential to tolerate intense rehabilitation     Barriers to Discharge        Equipment Recommendations  Rolling walker with 5" wheels    Recommendations for Other Services    Frequency Min 3X/week   Plan Discharge plan needs to be updated    Precautions / Restrictions Precautions Precautions: Fall Precaution Comments: Limited ROM in B knees and hips (L more so than R) Restrictions Weight Bearing Restrictions: No   Pertinent Vitals/Pain Pain is in "teens"  RN aware.     Mobility  Bed Mobility Bed Mobility: Supine to Sit Supine to Sit: 5: Supervision Details for Bed Mobility Assistance: Supervision for safety with min cues for technique in order to avoid stress on abdomen.  Transfers Transfers: Sit to Stand;Stand to Sit Sit to Stand: 4: Min guard;With upper extremity assist;From bed Stand to Sit: 4: Min guard;With upper extremity assist;With armrests;To chair/3-in-1 Details for Transfer Assistance: Min guard for safety with cues for hand placement and LE positioning due to pt wanting to ER left hip when standing.   Ambulation/Gait Ambulation/Gait Assistance: 5: Supervision Ambulation Distance (Feet): 400 Feet Assistive device: Rolling walker Ambulation/Gait Assistance Details: min cues for sequencing/technique with RW and to maintain upright posture.  Gait Pattern: Step-through  pattern;Decreased stride length Gait velocity: decreased    Exercises General Exercises - Lower Extremity Long Arc Quad: Strengthening;Both;10 reps Heel Slides: AAROM;Both;10 reps;Seated (Pt used leg over leg to assist.)   PT Diagnosis:    PT Problem List:   PT Treatment Interventions:     PT Goals Acute Rehab PT Goals PT Goal Formulation: With patient Time For Goal Achievement: 10/10/12 Potential to Achieve Goals: Good Pt will go Supine/Side to Sit: with modified independence PT Goal: Supine/Side to Sit - Progress: Updated due to goal met Pt will go Sit to Stand: with supervision PT Goal: Sit to Stand - Progress: Progressing toward goal Pt will go Stand to Sit: with supervision PT Goal: Stand to Sit - Progress: Progressing toward goal Pt will Ambulate: >150 feet;with modified independence;with least restrictive assistive device PT Goal: Ambulate - Progress: Progressing toward goal Pt will Perform Home Exercise Program: with supervision, verbal cues required/provided PT Goal: Perform Home Exercise Program - Progress: Progressing toward goal  Visit Information  Last PT Received On: 09/28/12 Assistance Needed: +1    Subjective Data  Subjective: The pain in my abdomen is still in the teens.  Patient Stated Goal: to get something done about my knees/hips    Cognition  Overall Cognitive Status: Appears within functional limits for tasks assessed/performed Arousal/Alertness: Awake/alert Orientation Level: Appears intact for tasks assessed Behavior During Session: [facility] for tasks performed    Balance     End of Session PT - End of Session Equipment Utilized During Treatment: Gait belt Activity Tolerance: Patient limited by pain Patient left: in chair;with call bell/phone within reach Nurse Communication: Mobility status   GP  Page, Betha Loa 09/28/2012, 3:58 PM

## 2012-09-28 NOTE — Progress Notes (Signed)
TELEMETRY: N/A Filed Vitals:   09/27/12 0618 09/27/12 1400 09/27/12 2139 09/28/12 0612  BP: 156/78 144/74 132/70 148/77  Pulse: 83 78 76 68  Temp: 98.6 F (37 C) 98.6 F (37 C) 98.7 F (37.1 C) 98.4 F (36.9 C)  TempSrc: Oral Oral Oral Oral  Resp: $Remo'20 18 20 20  'bmnmT$ Height:      Weight:      SpO2: 98% 99% 98% 97%    Intake/Output Summary (Last 24 hours) at 09/28/12 0719 Last data filed at 09/28/12 0606  Gross per 24 hour  Intake 2850.42 ml  Output   2230 ml  Net 620.42 ml    SUBJECTIVE Denies SOB or chest pain today. Having loose stools. Complains of burning on urination. Tolerating po's well.  LABS: Basic Metabolic Panel:  Basename 09/27/12 0423 09/26/12 0810 09/25/12 0813  NA 142 142 --  K 3.4* 3.3* --  CL 105 108 --  CO2 26 25 --  GLUCOSE 111* 95 --  BUN 13 13 --  CREATININE 1.40* 1.52* --  CALCIUM 9.1 8.4 --  MG -- -- 1.7  PHOS -- -- --   CBC:  Basename 09/27/12 0423  WBC 12.7*  NEUTROABS --  HGB 11.4*  HCT 35.4*  MCV 75.3*  PLT 248   Thyroid Function Tests:  Basename 09/25/12 0813  TSH 0.570  T4TOTAL --  T3FREE --  THYROIDAB --   Magnesium: 1.7  Radiology/Studies:  Dg Abd 1 View  09/25/2012  *RADIOLOGY REPORT*  Clinical Data: NG tube placement  ABDOMEN - 1 VIEW  Comparison: 09/24/2012 radiograph  Findings: NG tube projects over the left upper quadrant.  Interval decreased gastric distension. Persistent mild bowel distension is partially imaged.  Bibasilar opacities.  Lower abdomen/pelvis not included on the image.  IMPRESSION: NG tube tip projects over the stomach.   Original Report Authenticated By: Carlos Levering, M.D.    .   ECHO:Study Conclusions  - Left ventricle: The cavity size was normal. Wall thickness was increased in a pattern of moderate LVH. Systolic function was vigorous. The estimated ejection fraction was in the range of 65% to 70%. There was dynamic obstruction, with mid-cavity obliteration. Wall motion was  normal; there were no regional wall motion abnormalities. Features are consistent with a pseudonormal left ventricular filling pattern, with concomitant abnormal relaxation and increased filling pressure (grade 2 diastolic dysfunction). - Left atrium: The atrium was moderately dilated. - Right atrium: The atrium was mildly to moderately dilated. Transthoracic echocardiography. M-mode, complete 2D, spectral Doppler, and color Doppler. Height: Height: 180.3cm. Height: 71in. Weight: Weight: 88kg. Weight: 193.6lb. Body mass index: BMI: 27.1kg/m^2. Body surface area: BSA: 2.57m^2. Patient status: Inpatient.    PHYSICAL EXAM General: Well developed, well nourished, NAD Head: Normocephalic, atraumatic, sclera non-icteric, no xanthomas, nares are without discharge. Neck: Negative for carotid bruits. JVD not elevated. Lungs: Lungs are clear. Breathing is unlabored. Heart: RRR S1 S2 without murmurs, rubs, or gallops.  Abdomen: Soft, non-tender, non-distended with normoactive bowel sounds. Drain in place Msk:  Strength and tone appears normal for age. Extremities: No clubbing, cyanosis or edema.  Distal pedal pulses are 2+ and equal bilaterally. Neuro: Alert and oriented X 3. Moves all extremities spontaneously. Psych:  Responds to questions appropriately with a normal affect.  ASSESSMENT AND PLAN: 1. Atrial fibrillation with RVR. Patient has history of Afib in 2009. Current episode related to stress of recent surgery and ileus. Now converted back to sinus rhythm. Will switch metoprolol to po.  2.  Hypertensive heart disease with LVH. Vigorous systolic function. 3. S/p Appendectomy with ileus-resolving. 4. Right renal artery aneurysm. 5. HTN 6. Acute renal insufficiency-improving. 7. Hypokalemia-replete.  Patient is stable from a cardiac standpoint. I will sign off now. Patient needs follow up as outpatient with vascular surgery for evaluation of renal artery aneurysm.Please call with  questions/concerns.  Active Problems:  ATRIAL FIBRILLATION WITH RAPID VENTRICULAR RESPONSE  Abnormal ECG  Hypertensive cardiomyopathy  Renal artery aneurysm    Signed, Peter [name] MD,FACC 09/28/2012 7:19 AM

## 2012-09-29 ENCOUNTER — Inpatient Hospital Stay (HOSPITAL_COMMUNITY): Payer: Medicare HMO

## 2012-09-29 LAB — URINALYSIS, ROUTINE W REFLEX MICROSCOPIC
Bilirubin Urine: NEGATIVE
Ketones, ur: NEGATIVE mg/dL
Nitrite: NEGATIVE
Specific Gravity, Urine: 1.034 — ABNORMAL HIGH (ref 1.005–1.030)
Urobilinogen, UA: 0.2 mg/dL (ref 0.0–1.0)

## 2012-09-29 LAB — BASIC METABOLIC PANEL
CO2: 25 mEq/L (ref 19–32)
Calcium: 8.3 mg/dL — ABNORMAL LOW (ref 8.4–10.5)
Chloride: 105 mEq/L (ref 96–112)
Creatinine, Ser: 1.33 mg/dL (ref 0.50–1.35)
GFR calc Af Amer: 69 mL/min — ABNORMAL LOW (ref >90)
Sodium: 139 mEq/L (ref 135–145)

## 2012-09-29 LAB — URINE CULTURE
Colony Count: NO GROWTH
Culture: NO GROWTH

## 2012-09-29 LAB — URINE MICROSCOPIC-ADD ON

## 2012-09-29 LAB — CBC
MCH: 24.6 pg — ABNORMAL LOW (ref 26.0–34.0)
MCV: 74.4 fL — ABNORMAL LOW (ref 78.0–100.0)
Platelets: 297 10*3/uL (ref 150–400)
RBC: 4.06 MIL/uL — ABNORMAL LOW (ref 4.22–5.81)
RDW: 13.1 % (ref 11.5–15.5)
WBC: 11.1 10*3/uL — ABNORMAL HIGH (ref 4.0–10.5)

## 2012-09-29 LAB — PROTEIN / CREATININE RATIO, URINE: Total Protein, Urine: 145.3 mg/dL

## 2012-09-29 LAB — ANA: Anti Nuclear Antibody(ANA): NEGATIVE

## 2012-09-29 MED ORDER — IOHEXOL 300 MG/ML  SOLN
100.0000 mL | Freq: Once | INTRAMUSCULAR | Status: AC | PRN
  Administered 2012-09-29: 100 mL via INTRAVENOUS

## 2012-09-29 MED ORDER — PHENAZOPYRIDINE HCL 100 MG PO TABS
100.0000 mg | ORAL_TABLET | Freq: Three times a day (TID) | ORAL | Status: DC
  Administered 2012-09-29 – 2012-10-03 (×13): 100 mg via ORAL
  Filled 2012-09-29 (×16): qty 1

## 2012-09-29 MED ORDER — SIMETHICONE 80 MG PO CHEW
160.0000 mg | CHEWABLE_TABLET | Freq: Four times a day (QID) | ORAL | Status: DC | PRN
  Administered 2012-09-29 – 2012-10-03 (×7): 160 mg via ORAL
  Filled 2012-09-29 (×8): qty 2

## 2012-09-29 MED ORDER — HYDROMORPHONE HCL PF 1 MG/ML IJ SOLN
1.0000 mg | INTRAMUSCULAR | Status: DC | PRN
  Administered 2012-09-29 – 2012-09-30 (×3): 1 mg via INTRAVENOUS
  Administered 2012-09-30: 2 mg via INTRAVENOUS
  Administered 2012-09-30: 1 mg via INTRAVENOUS
  Administered 2012-09-30 (×2): 2 mg via INTRAVENOUS
  Administered 2012-09-30: 1 mg via INTRAVENOUS
  Administered 2012-10-01 (×6): 2 mg via INTRAVENOUS
  Administered 2012-10-02: 1 mg via INTRAVENOUS
  Administered 2012-10-02 (×5): 2 mg via INTRAVENOUS
  Administered 2012-10-03: 1 mg via INTRAVENOUS
  Filled 2012-09-29 (×2): qty 1
  Filled 2012-09-29 (×2): qty 2
  Filled 2012-09-29: qty 1
  Filled 2012-09-29: qty 2
  Filled 2012-09-29: qty 1
  Filled 2012-09-29 (×3): qty 2
  Filled 2012-09-29 (×2): qty 1
  Filled 2012-09-29 (×3): qty 2
  Filled 2012-09-29 (×2): qty 1
  Filled 2012-09-29 (×4): qty 2
  Filled 2012-09-29: qty 1

## 2012-09-29 NOTE — Progress Notes (Addendum)
INITIAL ADULT NUTRITION ASSESSMENT Date: 09/29/2012   Time: 11:24 AM Reason for Assessment: NPO/clear liquids   INTERVENTION: Diet advancement per MD. This is pt's 7th day without solid food, if diet cannot be advanced in next 1-2 days, recommend MD consider nutrition support.  ASSESSMENT: Male 51 y.o.  Dx: Abdominal pain and nausea for 2 days  Food/Nutrition Related Hx: Pt admitted with abdominal pain and nausea for the past 2 days. Pt found to have perforated appendicitis requiring emergent exploratory laparotomy with appendectomy on 11/15. Pt with episode of vomiting with abdominal pain on 11/17 and subsequently had NGT placed. NGT pulled out by pt on 11/18. Pt transferred to ICU on 11/18 for atrial fibrillation with RVR. Pt coughed blood tinged sputum and noted to have excess sputum production on 11/18. Pt accidentally pulled out NGT on 11/20. Pt with multiple episodes of loose stool during the night of 11/20. Met with pt who denied any nausea but still having abdominal pain. Pt reports PTA he was eating well with great appetite and stable weight. Pt with possible post-op ileus.    Hx:  Past Medical History  Diagnosis Date  . Hypertensive cardiomyopathy   . Hypertension   . Chronic kidney disease   . Atrial fibrillation     Isolated episode   Related Meds:  Scheduled Meds:   . antiseptic oral rinse  15 mL Mouth Rinse q12n4p  . aspirin  81 mg Oral Daily  . chlorhexidine  15 mL Mouth Rinse BID  . enoxaparin  40 mg Subcutaneous Q24H  . metoprolol tartrate  25 mg Oral BID  . nicotine  14 mg Transdermal Daily  . pantoprazole (PROTONIX) IV  40 mg Intravenous Q24H  . phenazopyridine  100 mg Oral TID WC  . piperacillin-tazobactam (ZOSYN)  IV  3.375 g Intravenous Q8H   Continuous Infusions:   . dextrose 5 % and 0.9 % NaCl with KCl 20 mEq/L 100 mL/hr at 09/29/12 1100   PRN Meds:.albuterol, ondansetron (ZOFRAN) IV, ondansetron, oxyCODONE-acetaminophen, phenol  Ht: $Re'5\' 11"'lwh$  (180.3  cm)  Wt: 191 lb 2.2 oz (86.7 kg)  Ideal Wt: 172 lb % Ideal Wt: 111  Usual Wt: 191 lb % Usual Wt: 100  Body mass index is 26.66 kg/(m^2).   Labs:  CMP     Component Value Date/Time   NA 139 09/29/2012 0840   K 3.1* 09/29/2012 0840   CL 105 09/29/2012 0840   CO2 25 09/29/2012 0840   GLUCOSE 104* 09/29/2012 0840   BUN 9 09/29/2012 0840   CREATININE 1.33 09/29/2012 0840   CREATININE 1.59* 12/10/2011 1015   CALCIUM 8.3* 09/29/2012 0840   PROT 6.8 09/22/2012 1045   ALBUMIN 3.8 09/22/2012 1045   AST 19 09/22/2012 1045   ALT 15 09/22/2012 1045   ALKPHOS 72 09/22/2012 1045   BILITOT 1.4* 09/22/2012 1045   GFRNONAA 60* 09/29/2012 0840   GFRAA 69* 09/29/2012 0840    Intake/Output Summary (Last 24 hours) at 09/29/12 1207 Last data filed at 09/29/12 1100  Gross per 24 hour  Intake   1850 ml  Output   1210 ml  Net    640 ml   Last BM - 11/21   Diet Order: NPO   IVF:    dextrose 5 % and 0.9 % NaCl with KCl 20 mEq/L Last Rate: 100 mL/hr at 09/29/12 1100    Estimated Nutritional Needs:   DGLO:7564-3329 Protein:105-120g Fluid:1.9-2.1L  NUTRITION DIAGNOSIS: -Inadequate oral intake (NI-2.1).  Status: Ongoing  RELATED TO: inability  to eat  AS EVIDENCE BY: NPO  MONITORING/EVALUATION(Goals): Advance diet as tolerated to regular diet  EDUCATION NEEDS: -No education needs identified at this time   Dietitian #: Douglas Per approved criteria  -Not Applicable    Glory Rosebush 09/29/2012, 11:24 AM

## 2012-09-29 NOTE — Progress Notes (Signed)
Patient mildly distended; C diff negative Will check CT scan to rule out intra-abdominal abscess  Imogene Burn. Georgette Dover, MD, [facility]  09/29/2012 1:58 PM

## 2012-09-29 NOTE — Progress Notes (Signed)
Patient is diaphoretic, complaining of abdominal pain with minimal relief from percocet.   Patient afebrile and vital signs are stable.  Will Paul [name] and Gus Height NP aware, came and saw patient.  Patient is to be NPO, dilaudid ordered for pain and simethicone for gas, encouraged patient to walk, patient refused at this time.  Will continue to monitor.

## 2012-09-29 NOTE — Progress Notes (Signed)
Patient ID: Paul [name], male   DOB: 02-Aug-1961, 51 y.o.   MRN: [id number]   7 Days Post-Op   Subjective:  Continues with diffuse lower abdominal pain and distention this am, no N/V; still with loose stools x 3 last night (C-Diff negative). Also continues to have c/o of painful urination. UA negativefor infection;  proteinuria improved, no blood in UA this am. (added pyridium for comfort) Will obtain abd films this morning, ? Need for CT scan.  Abdominal drain functioning well. Drainage serous fluid (560 ml) Spoke with patients sister by phone updating family. Patient request that we speak with his Nephew who is an M.D. For update as well.  Objective: Vital signs in last 24 hours: Temp:  [98.4 F (36.9 C)-98.7 F (37.1 C)] 98.4 F (36.9 C) (11/21 0612) Pulse Rate:  [68-78] 68  (11/21 0612) Resp:  [18-20] 20  (11/21 0612) BP: (132-148)/(70-77) 148/77 mmHg (11/21 0612) SpO2:  [97 %-99 %] 97 % (11/21 0612) Last BM Date: 09/27/12  Intake/Output from previous day: 11/20 0701 - 11/21 0700 In: 2850.4 [P.O.:1200; I.V.:1350.4; IV Piggyback:300] Out: 2230 [Urine:1800; Drains:430] Intake/Output this shift:  General appearance: A/A/O no distress, frustrated with pace of his progress. Chest: CTA Cardiac: RRR Abdomen: staples in place, distended, non tender,+ BS, BM,(diarrhea) flatus. JP drain output (540ml thin serous fluid) Extremities: warm, + pulses, no edema or tenderness. Labs: pending VSS,afebrile    Lab Results:   [facility] 09/27/12 0423  WBC 12.7*  HGB 11.4*  HCT 35.4*  PLT 248   BMET  Basename 09/27/12 0423 09/26/12 0810  NA 142 142  K 3.4* 3.3*  CL 105 108  CO2 26 25  GLUCOSE 111* 95  BUN 13 13  CREATININE 1.40* 1.52*  CALCIUM 9.1 8.4   PT/INR No results found for this basename: LABPROT:2,INR:2 in the last 72 hours ABG No results found for this basename: PHART:2,PCO2:2,PO2:2,HCO3:2 in the last 72 hours  Studies/Results: No results  found.  Anti-infectives: Anti-infectives     Start     Dose/Rate Route Frequency Ordered Stop   09/23/12 0600   ertapenem (INVANZ) 1 g in sodium chloride 0.9 % 50 mL IVPB  Status:  Discontinued        1 g 100 mL/hr over 30 Minutes Intravenous 60 min pre-op 09/22/12 1440 09/22/12 1846   09/22/12 2200   metroNIDAZOLE (FLAGYL) IVPB 500 mg  Status:  Discontinued        500 mg 100 mL/hr over 60 Minutes Intravenous Every 8 hours 09/22/12 1903 09/23/12 0804   09/22/12 2000   piperacillin-tazobactam (ZOSYN) IVPB 3.375 g        3.375 g 12.5 mL/hr over 240 Minutes Intravenous Every 8 hours 09/22/12 1903     09/22/12 1400   ertapenem (INVANZ) 1 g in sodium chloride 0.9 % 50 mL IVPB        1 g 100 mL/hr over 30 Minutes Intravenous  Once 09/22/12 1345 09/22/12 1630          Assessment/Plan:  Patient Active Problem List  Diagnosis  . DERMATOPHYTOSIS OF NAIL  . HYPERLIPIDEMIA  . ERECTILE DYSFUNCTION  . TOBACCO USE  . Other, mixed, or unspecified nondependent drug abuse, unspecified  . HYPERTENSION, BENIGN SYSTEMIC  . CARDIOMYOPATHY, HYPERTROPHIC  . ATRIAL FIBRILLATION WITH RAPID VENTRICULAR RESPONSE  . RHINITIS, ALLERGIC NOS  . GERD  . RENAL INSUFFICIENCY, CHRONIC  . SHOULDER PAIN, LEFT  . Pain in Joint, Pelvic Region and Thigh  . KNEE PAIN, RIGHT  .  BACK PAIN  . Unspecified disorders of bursae and tendons in shoulder region  . ABNORMALITY OF GAIT  . Cough  . URI (upper respiratory infection)  . Stomach pain  . Bilateral hip pain  . Abnormal ECG  . Hypertensive cardiomyopathy  . Renal artery aneurysm    Post-op ileus    A-fib per cardiology, off cardizem gtt (stable)   Leukocytosis (improving) Painful urination + protein and blood in UA ? Cause  Plan: 1. Will hold diet for now; obtain abdominal films, ? Need for CT. 2. IVF, pain management.  3. recheck labs this morning 5. Nephrology consult 6. Continue IV abx for now (will discuss with Dr. Georgette Dover) 7. Staples to  remain in until at least 10/02/12        LOS: 6 days    [name] [facility] Pager # [phone]  09/28/2012

## 2012-09-30 NOTE — Progress Notes (Signed)
Patient ID: Paul [name], male   DOB: [birth year], 51 y.o.   MRN: [id number] [facility] Progress Note:   8 Days Post-Op  Subjective: Mental status is clear.  Pt sitting on toilet trying to expel more gas.  Frustrated Objective: Vital signs in last 24 hours: Temp:  [98.3 F (36.8 C)-99.5 F (37.5 C)] 99 F (37.2 C) (11/23 0532) Pulse Rate:  [62-65] 64  (11/23 0532) Resp:  [16-18] 16  (11/23 0532) BP: (119-151)/(69-83) 119/69 mmHg (11/23 0532) SpO2:  [92 %-97 %] 92 % (11/23 0532)  Intake/Output from previous day: 11/22 0701 - 11/23 0700 In: 2734.6 [I.V.:2484.6; IV Piggyback:100] Out: 1140 [Urine:1000; Drains:140] Intake/Output this shift: Total I/O In: -  Out: 60 [Drains:60]  Physical Exam: Work of breathing is normal.  JP in side with minimal drainage.  Reports some flatus this morning with walking  Lab Results:  Results for orders placed during the hospital encounter of 09/22/12 (from the past 48 hour(s))  BASIC METABOLIC PANEL     Status: Abnormal   Collection Time   09/28/12  9:30 AM      Component Value Range Comment   Sodium 145  135 - 145 mEq/L    Potassium 3.2 (*) 3.5 - 5.1 mEq/L    Chloride 107  96 - 112 mEq/L    CO2 25  19 - 32 mEq/L    Glucose, Bld 122 (*) 70 - 99 mg/dL    BUN 12  6 - 23 mg/dL    Creatinine, Ser 1.55 (*) 0.50 - 1.35 mg/dL    Calcium 8.8  8.4 - 10.5 mg/dL    GFR calc non Af Amer 49 (*) >90 mL/min    GFR calc Af Amer 57 (*) >90 mL/min   CBC     Status: Abnormal   Collection Time   09/28/12  9:30 AM      Component Value Range Comment   WBC 11.4 (*) 4.0 - 10.5 K/uL    RBC 5.06  4.22 - 5.81 MIL/uL    Hemoglobin 12.4 (*) 13.0 - 17.0 g/dL    HCT 37.5 (*) 39.0 - 52.0 %    MCV 74.1 (*) 78.0 - 100.0 fL    MCH 24.5 (*) 26.0 - 34.0 pg    MCHC 33.1  30.0 - 36.0 g/dL    RDW 13.0  11.5 - 15.5 %    Platelets 306  150 - 400 K/uL   ANA     Status: Normal   Collection Time   09/28/12 10:25 AM      Component Value Range Comment   ANA  NEGATIVE  NEGATIVE   C3 COMPLEMENT     Status: Normal   Collection Time   09/28/12 10:25 AM      Component Value Range Comment   C3 Complement 117  90 - 180 mg/dL   C4 COMPLEMENT     Status: Normal   Collection Time   09/28/12 10:25 AM      Component Value Range Comment   Complement C4, Body Fluid 26  10 - 40 mg/dL   ANTI-DNA ANTIBODY, DOUBLE-STRANDED     Status: Normal   Collection Time   09/28/12 10:25 AM      Component Value Range Comment   ds DNA Ab 2  <30 IU/mL   URINALYSIS, ROUTINE W REFLEX MICROSCOPIC     Status: Abnormal   Collection Time   09/29/12  4:34 AM      Component Value Range Comment  Color, Urine YELLOW  YELLOW    APPearance CLEAR  CLEAR    Specific Gravity, Urine 1.034 (*) 1.005 - 1.030    pH 6.0  5.0 - 8.0    Glucose, UA NEGATIVE  NEGATIVE mg/dL    Hgb urine dipstick SMALL (*) NEGATIVE    Bilirubin Urine NEGATIVE  NEGATIVE    Ketones, ur NEGATIVE  NEGATIVE mg/dL    Protein, ur 100 (*) NEGATIVE mg/dL    Urobilinogen, UA 0.2  0.0 - 1.0 mg/dL    Nitrite NEGATIVE  NEGATIVE    Leukocytes, UA NEGATIVE  NEGATIVE   URINE MICROSCOPIC-ADD ON     Status: Normal   Collection Time   09/29/12  4:34 AM      Component Value Range Comment   Squamous Epithelial / LPF RARE  RARE    WBC, UA 0-2  <3 WBC/hpf    RBC / HPF 3-6  <3 RBC/hpf    Bacteria, UA RARE  RARE    Urine-Other MUCOUS PRESENT     PROTEIN / CREATININE RATIO, URINE     Status: Abnormal   Collection Time   09/29/12  4:35 AM      Component Value Range Comment   Creatinine, Urine 257.45      Total Protein, Urine 145.3   NO NORMAL RANGE ESTABLISHED FOR THIS TEST   PROTEIN CREATININE RATIO 0.56 (*) 0.00 - 0.15   CBC     Status: Abnormal   Collection Time   09/29/12  8:40 AM      Component Value Range Comment   WBC 11.1 (*) 4.0 - 10.5 K/uL    RBC 4.06 (*) 4.22 - 5.81 MIL/uL    Hemoglobin 10.0 (*) 13.0 - 17.0 g/dL    HCT 30.2 (*) 39.0 - 52.0 %    MCV 74.4 (*) 78.0 - 100.0 fL    MCH 24.6 (*) 26.0 - 34.0  pg    MCHC 33.1  30.0 - 36.0 g/dL    RDW 13.1  11.5 - 15.5 %    Platelets 297  150 - 400 K/uL   BASIC METABOLIC PANEL     Status: Abnormal   Collection Time   09/29/12  8:40 AM      Component Value Range Comment   Sodium 139  135 - 145 mEq/L    Potassium 3.1 (*) 3.5 - 5.1 mEq/L    Chloride 105  96 - 112 mEq/L    CO2 25  19 - 32 mEq/L    Glucose, Bld 104 (*) 70 - 99 mg/dL    BUN 9  6 - 23 mg/dL    Creatinine, Ser 1.33  0.50 - 1.35 mg/dL    Calcium 8.3 (*) 8.4 - 10.5 mg/dL    GFR calc non Af Amer 60 (*) >90 mL/min    GFR calc Af Amer 69 (*) >90 mL/min     Radiology/Results: Dg Abd 1 View  09/29/2012  *RADIOLOGY REPORT*  Clinical Data: Follow up partial small bowel obstruction, abdominal pain and distention  ABDOMEN - 1 VIEW  Comparison: Abdomen films of 09/28/2012 and 09/24/2012  Findings: There is little change in gaseous distention of primarily small bowel.  This is consistent with persistent small bowel obstruction.  Little if any colonic bowel gas is seen.  A drain overlies the lower abdomen.  Right total hip replacement and pinning of prior left hip fracture is noted with considerable degenerative change in the left hip.  IMPRESSION: Persistent gaseous distention of small  bowel consistent with a partial small bowel obstruction.   Original Report Authenticated By: Ivar Drape, M.D.    Dg Abd 1 View  09/28/2012  *RADIOLOGY REPORT*  Clinical Data: Ileus, follow-up, history of recently ruptured appendix with left sided abdominal drain  ABDOMEN - 1 VIEW  Comparison: Abdomen film of 09/24/2012  Findings: There are still dilated loops of small bowel most consistent with a partial small bowel obstruction.  Very little colonic bowel gas is seen.  Right total hip replacement is noted with prior left hip pinning and severe degenerative joint disease of the left hip.  A drain overlies the right lower quadrant and mid pelvis.  IMPRESSION:   Dilated loops of small bowel most consistent with partial  small bowel obstruction.   Original Report Authenticated By: Ivar Drape, M.D.    Ct Abdomen Pelvis W Contrast  09/29/2012  *RADIOLOGY REPORT*  Clinical Data: Pain and distention.  CT ABDOMEN AND PELVIS WITH CONTRAST  Technique:  Multidetector CT imaging of the abdomen and pelvis was performed following the standard protocol during bolus administration of intravenous contrast.  Contrast: 142mL OMNIPAQUE IOHEXOL 300 MG/ML  SOLN  Comparison: CT, 09/22/2012  Findings: Significant small bowel dilation with air-fluid levels has developed since the prior exam.  Maximum small bowel dilation measures 5.2 cm.  More distally, the more distal ileum is decompressed.  The transition point, although not correctly seen, is suggested in the right lower quadrant adjacent to the a catheter that has been inserted through the right lower quadrant.  Given the findings on the prior CT, the likely from postoperative effusions versus postoperative inflammation.  The patient has had a and appendectomy since the prior study that appears to have been performed laparoscopically.  There is now no free air.  No defined collection is seen to suggest an abscess.  There are right lower quadrant inflammatory changes.  There are small bilateral effusions with dependent lung base atelectasis.  Small low density lesion noted in the right lobe of the liver is likely a cyst or small meningioma.  It is unchanged from the prior study.  The liver is otherwise unremarkable.  Normal spleen and gallbladder.  There is no bile duct dilation.  The pancreatic duct measures just under 4 mm in greatest diameter, which is presumably chronic.  The pancreas is otherwise unremarkable.  No adrenal masses.  The right renal vein is enlarged and there is a dilated portion of the vein along the medial upper aspect of the right kidney consistent with a varix.  There are areas of bilateral renal scarring.  There are no renal masses or hydronephrosis.  No adenopathy is seen.   The colon is normal in caliber.  IMPRESSION: Relatively high grade partial small bowel obstruction with transition point not discretely seen but most likely in the right lower quadrant where there are postsurgical inflammatory changes from the recent appendectomy of what presumably was a ruptured appendix.  There is no defined abscess on the current study and no free air.   Original Report Authenticated By: Lajean Manes, M.D.    US Renal  09/28/2012  *RADIOLOGY REPORT*  Clinical Data: Hematuria and proteinuria.  RENAL/URINARY TRACT ULTRASOUND COMPLETE  Comparison:  01/25/2005.  CT of the abdomen and pelvis 09/22/2012.  Findings:  Right Kidney:  In the upper and interpolar region of the right kidney there are numerous anechoic areas which at first glance appear to represent a dilated collecting system and possible cyst in the upper pole.  However,  with color Doppler imaging all of these areas demonstrate increased vascular flow (many with frank aliasing, suggesting high velocity turbulent flow).  There are other areas of some parenchymal thinning throughout the kidney indicative of some degree of cortical atrophy.  The preserved areas of cortex otherwise have normal echotexture.  12.3 cm in length.  Left Kidney:  The left kidney is relatively poorly visualized, but is also remarkable for some areas of cortical thinning likely to reflect scarring. Preserved areas of cortex otherwise have normal echotexture.  No hydronephrosis.  No definite focal cystic or solid lesions.  10.4 cm in length.  Bladder:  Moderately distended without focal wall abnormalities.  Trace right pleural effusion incidentally noted.  Additionally, within segment VII of the liver there is a small echogenic lesion which likely represents a small cavernous hemangioma.  IMPRESSION: 1.  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 2.  Multifocal cortical scarring in the kidneys bilaterally. 3.  Trace right pleural effusion. 4.  Probable small cavernous hemangioma in segment VII of the liver.   Original Report Authenticated By: Vinnie Langton, M.D.     Anti-infectives: Anti-infectives     Start     Dose/Rate Route Frequency Ordered Stop   09/23/12 0600   ertapenem (INVANZ) 1 g in sodium chloride 0.9 % 50 mL IVPB  Status:  Discontinued        1 g 100 mL/hr over 30 Minutes Intravenous 60 min pre-op 09/22/12 1440 09/22/12 1846   09/22/12 2200   metroNIDAZOLE (FLAGYL) IVPB 500 mg  Status:  Discontinued        500 mg 100 mL/hr over 60 Minutes Intravenous Every 8 hours 09/22/12 1903 09/23/12 0804   09/22/12 2000  piperacillin-tazobactam (ZOSYN) IVPB 3.375 g       3.375 g 12.5 mL/hr over 240 Minutes Intravenous Every 8 hours 09/22/12 1903     09/22/12 1400   ertapenem (INVANZ) 1 g in sodium chloride 0.9 % 50 mL IVPB        1 g 100 mL/hr over 30 Minutes Intravenous  Once 09/22/12 1345 09/22/12 1630          Assessment/Plan: Problem List: Patient Active Problem List  Diagnosis  . DERMATOPHYTOSIS OF NAIL  . HYPERLIPIDEMIA  . ERECTILE DYSFUNCTION  . TOBACCO USE  . Other, mixed, or unspecified nondependent drug abuse, unspecified  . HYPERTENSION, BENIGN SYSTEMIC  . CARDIOMYOPATHY, HYPERTROPHIC  . ATRIAL FIBRILLATION WITH RAPID VENTRICULAR RESPONSE  . RHINITIS, ALLERGIC NOS  . GERD  . RENAL INSUFFICIENCY, CHRONIC  . SHOULDER PAIN, LEFT  . Pain in Joint, Pelvic Region and Thigh  . KNEE PAIN, RIGHT  . BACK PAIN  . Unspecified disorders of bursae and tendons in shoulder region  . ABNORMALITY OF GAIT  . Cough  . URI (upper respiratory infection)  . Stomach pain  . Bilateral hip pain  . Abnormal ECG  . Hypertensive cardiomyopathy  . Renal artery aneurysm    Post appendectomy for ruptured appendicitis.  Ileus noted on CT related to inflammatory changes in RLQ. Passing flatus.   Will offer clears.     8 Days Post-Op    LOS: 8 days   Matt B. Hassell Done, MD, [facility] [phone] beeper [phone]  09/30/2012 9:04 AM

## 2012-09-30 NOTE — Progress Notes (Signed)
PT HAVING DRAINAGE AROUND JP SITE AS WELL AS IN THE ACTUAL JP.  REPORTED TO ME THAT JP DRESSING HAD TO BE CHANGED SEVERAL TIMES A SHIFT.  PT ALSO STILL C/O PAIN WITH URINATION.  PT JOKED THAT " MAYBE WE HAD GIVEN HIM AN STD".  MAYBE [place] AN STD.

## 2012-10-01 LAB — CBC WITH DIFFERENTIAL/PLATELET
Basophils Absolute: 0 10*3/uL (ref 0.0–0.1)
Eosinophils Relative: 2 % (ref 0–5)
Lymphocytes Relative: 14 % (ref 12–46)
Monocytes Relative: 6 % (ref 3–12)
Neutrophils Relative %: 78 % — ABNORMAL HIGH (ref 43–77)
Platelets: 334 10*3/uL (ref 150–400)
RBC: 4.09 MIL/uL — ABNORMAL LOW (ref 4.22–5.81)
RDW: 13.4 % (ref 11.5–15.5)
WBC: 11.8 10*3/uL — ABNORMAL HIGH (ref 4.0–10.5)

## 2012-10-01 LAB — COMPREHENSIVE METABOLIC PANEL
ALT: 15 U/L (ref 0–53)
AST: 21 U/L (ref 0–37)
Alkaline Phosphatase: 54 U/L (ref 39–117)
CO2: 25 mEq/L (ref 19–32)
Calcium: 8.5 mg/dL (ref 8.4–10.5)
Chloride: 107 mEq/L (ref 96–112)
GFR calc non Af Amer: 42 mL/min — ABNORMAL LOW (ref >90)
Glucose, Bld: 135 mg/dL — ABNORMAL HIGH (ref 70–99)
Potassium: 3.7 mEq/L (ref 3.5–5.1)
Sodium: 140 mEq/L (ref 135–145)
Total Bilirubin: 0.8 mg/dL (ref 0.3–1.2)

## 2012-10-01 NOTE — Progress Notes (Signed)
Patient ID: Paul [name], male   DOB: 12-19-1960, 51 y.o.   MRN: [id number] 9 Days Post-Op  Subjective: Some intermittent abdominal pain, no change. He has passed flatus and had a couple of loose bowel movements. He thinks his abdominal distention is about the same.  Objective: Vital signs in last 24 hours: Temp:  [97.7 F (36.5 C)-98 F (36.7 C)] 98 F (36.7 C) (11/24 0455) Pulse Rate:  [56-61] 56  (11/24 0455) Resp:  [16-18] 18  (11/24 0455) BP: (112-117)/(69-80) 112/69 mmHg (11/24 0455) SpO2:  [92 %-97 %] 96 % (11/24 0455) Last BM Date: 09/30/12  Intake/Output from previous day: 11/23 0701 - 11/24 0700 In: 3642.1 [P.O.:240; I.V.:3202.1; IV Piggyback:200] Out: 880 [Urine:800; Drains:80] Intake/Output this shift:    General appearance: alert, cooperative and mild distress GI: remains distended and tympanitic. No significant tenderness. Bowel sounds are active. Incision/Wound: laparoscopic incisions healing without infection  Lab Results:   Basename 10/01/12 0428 09/29/12 0840  WBC 11.8* 11.1*  HGB 9.9* 10.0*  HCT 31.2* 30.2*  PLT 334 297   BMET  Basename 10/01/12 0428 09/29/12 0840  NA 140 139  K 3.7 3.1*  CL 107 105  CO2 25 25  GLUCOSE 135* 104*  BUN 12 9  CREATININE 1.79* 1.33  CALCIUM 8.5 8.3*     Studies/Results: Ct Abdomen Pelvis W Contrast  09/29/2012  *RADIOLOGY REPORT*  Clinical Data: Pain and distention.  CT ABDOMEN AND PELVIS WITH CONTRAST  Technique:  Multidetector CT imaging of the abdomen and pelvis was performed following the standard protocol during bolus administration of intravenous contrast.  Contrast: 139mL OMNIPAQUE IOHEXOL 300 MG/ML  SOLN  Comparison: CT, 09/22/2012  Findings: Significant small bowel dilation with air-fluid levels has developed since the prior exam.  Maximum small bowel dilation measures 5.2 cm.  More distally, the more distal ileum is decompressed.  The transition point, although not correctly seen, is suggested in the  right lower quadrant adjacent to the a catheter that has been inserted through the right lower quadrant.  Given the findings on the prior CT, the likely from postoperative effusions versus postoperative inflammation.  The patient has had a and appendectomy since the prior study that appears to have been performed laparoscopically.  There is now no free air.  No defined collection is seen to suggest an abscess.  There are right lower quadrant inflammatory changes.  There are small bilateral effusions with dependent lung base atelectasis.  Small low density lesion noted in the right lobe of the liver is likely a cyst or small meningioma.  It is unchanged from the prior study.  The liver is otherwise unremarkable.  Normal spleen and gallbladder.  There is no bile duct dilation.  The pancreatic duct measures just under 4 mm in greatest diameter, which is presumably chronic.  The pancreas is otherwise unremarkable.  No adrenal masses.  The right renal vein is enlarged and there is a dilated portion of the vein along the medial upper aspect of the right kidney consistent with a varix.  There are areas of bilateral renal scarring.  There are no renal masses or hydronephrosis.  No adenopathy is seen.  The colon is normal in caliber.  IMPRESSION: Relatively high grade partial small bowel obstruction with transition point not discretely seen but most likely in the right lower quadrant where there are postsurgical inflammatory changes from the recent appendectomy of what presumably was a ruptured appendix.  There is no defined abscess on the current study and  no free air.   Original Report Authenticated By: Lajean Manes, M.D.     Anti-infectives: Anti-infectives     Start     Dose/Rate Route Frequency Ordered Stop   09/23/12 0600   ertapenem (INVANZ) 1 g in sodium chloride 0.9 % 50 mL IVPB  Status:  Discontinued        1 g 100 mL/hr over 30 Minutes Intravenous 60 min pre-op 09/22/12 1440 09/22/12 1846   09/22/12 2200    metroNIDAZOLE (FLAGYL) IVPB 500 mg  Status:  Discontinued        500 mg 100 mL/hr over 60 Minutes Intravenous Every 8 hours 09/22/12 1903 09/23/12 0804   09/22/12 2000  piperacillin-tazobactam (ZOSYN) IVPB 3.375 g       3.375 g 12.5 mL/hr over 240 Minutes Intravenous Every 8 hours 09/22/12 1903     09/22/12 1400   ertapenem (INVANZ) 1 g in sodium chloride 0.9 % 50 mL IVPB        1 g 100 mL/hr over 30 Minutes Intravenous  Once 09/22/12 1345 09/22/12 1630          Assessment/Plan: s/p Procedure(s): LAPAROSCOPY DIAGNOSTIC APPENDECTOMY LAPAROSCOPIC Partial small bowel obstruction probably inflammatory adhesions to the area of periappendiceal inflammation. He has had some flatus and loose stools but remains quite distended. He is tolerating some clear liquids and we will continue this today. Encouraged to ambulate. Recheck abdominal x-rays and labs tomorrow morning.   LOS: 9 days    Taiwan Millon T 10/01/2012

## 2012-10-02 ENCOUNTER — Inpatient Hospital Stay (HOSPITAL_COMMUNITY): Payer: Medicare HMO

## 2012-10-02 LAB — CBC
Platelets: 348 10*3/uL (ref 150–400)
RDW: 13.7 % (ref 11.5–15.5)
WBC: 12.9 10*3/uL — ABNORMAL HIGH (ref 4.0–10.5)

## 2012-10-02 LAB — BASIC METABOLIC PANEL
Chloride: 107 mEq/L (ref 96–112)
GFR calc Af Amer: 62 mL/min — ABNORMAL LOW (ref >90)
Potassium: 4 mEq/L (ref 3.5–5.1)

## 2012-10-02 NOTE — Progress Notes (Signed)
10 Days Post-Op  Subjective: Generally feels better, having more formed BM per patient. Remains distended and PSBO by abdominal films today.   Objective: Vital signs in last 24 hours: Temp:  [97.4 F (36.3 C)-98.3 F (36.8 C)] 98.3 F (36.8 C) 2023/10/16 0620) Pulse Rate:  [57-67] 57  10-16-2023 0620) Resp:  [18] 18  16-Oct-2023 0620) BP: (120-144)/(65-78) 120/67 mmHg 2023-10-16 0620) SpO2:  [94 %-98 %] 96 % 2023/10/16 0620) Last BM Date: October 15, 2012  Intake/Output from previous day: 11/24 0701 - 2023/10/16 0700 In: 3225 [I.V.:3075; IV Piggyback:150] Out: 740 [Urine:700; Drains:40] Intake/Output this shift:    General appearance: alert, cooperative, appears stated age and no distress Chest: CTA Cardiac: RRR Abdomen: abdominal drain (40 ml serous drainage) soft, remains slightly distended, +BS, BM x4 per recorded on I&O. All surgical wounds appear to be healing well, staples remain in place.  WBC has trended up slightly, BUN and Creatine have improved. VSS,afebrile.  Lab Results:   Basename 10-15-12 0430 10/01/12 0428  WBC 12.9* 11.8*  HGB 9.4* 9.9*  HCT 29.9* 31.2*  PLT 348 334   BMET  Basename Oct 15, 2012 0430 10/01/12 0428  NA 138 140  K 4.0 3.7  CL 107 107  CO2 23 25  GLUCOSE 108* 135*  BUN 8 12  CREATININE 1.45* 1.79*  CALCIUM 8.3* 8.5   PT/INR No results found for this basename: LABPROT:2,INR:2 in the last 72 hours ABG No results found for this basename: PHART:2,PCO2:2,PO2:2,HCO3:2 in the last 72 hours  Studies/Results: Dg Abd 2 Views  Oct 15, 2012  *RADIOLOGY REPORT*  Clinical Data: Follow up small bowel obstruction.  ABDOMEN - 2 VIEW  Comparison: Abdominal CT 09/29/2012  Findings: Upright and supine views of the abdomen again demonstrate dilated loops of small bowel with air-fluid levels.  Small bowel loops measure up to 6.1 cm.  There is a small amount contrast within the rectum and colon.  There is also a surgical drain in the pelvis.  Limited evaluation for free air on this  examination. Extensive postsurgical changes in both hips and severe degenerative changes in the left hip.  There is patchy densities at the lung bases suggestive for volume loss.  IMPRESSION: Dilated loops of small bowel with air-fluid levels.  There is a small amount of contrast in the colon.  Findings are suggestive for a high-grade partial small bowel obstruction.   Original Report Authenticated By: Markus Daft, M.D.     Anti-infectives: Anti-infectives     Start     Dose/Rate Route Frequency Ordered Stop   09/23/12 0600   ertapenem (INVANZ) 1 g in sodium chloride 0.9 % 50 mL IVPB  Status:  Discontinued        1 g 100 mL/hr over 30 Minutes Intravenous 60 min pre-op 09/22/12 1440 09/22/12 1846   09/22/12 2200   metroNIDAZOLE (FLAGYL) IVPB 500 mg  Status:  Discontinued        500 mg 100 mL/hr over 60 Minutes Intravenous Every 8 hours 09/22/12 1903 09/23/12 0804   09/22/12 2000  piperacillin-tazobactam (ZOSYN) IVPB 3.375 g       3.375 g 12.5 mL/hr over 240 Minutes Intravenous Every 8 hours 09/22/12 1903     09/22/12 1400   ertapenem (INVANZ) 1 g in sodium chloride 0.9 % 50 mL IVPB        1 g 100 mL/hr over 30 Minutes Intravenous  Once 09/22/12 1345 09/22/12 1630          Assessment/Plan:  Patient Active Problem List  Diagnosis  . DERMATOPHYTOSIS OF NAIL  . HYPERLIPIDEMIA  . ERECTILE DYSFUNCTION  . TOBACCO USE  . Other, mixed, or unspecified nondependent drug abuse, unspecified  . HYPERTENSION, BENIGN SYSTEMIC  . CARDIOMYOPATHY, HYPERTROPHIC  . ATRIAL FIBRILLATION WITH RAPID VENTRICULAR RESPONSE  . RHINITIS, ALLERGIC NOS  . GERD  . RENAL INSUFFICIENCY, CHRONIC  . SHOULDER PAIN, LEFT  . Pain in Joint, Pelvic Region and Thigh  . KNEE PAIN, RIGHT  . BACK PAIN  . Unspecified disorders of bursae and tendons in shoulder region  . ABNORMALITY OF GAIT  . Cough  . URI (upper respiratory infection)  . Stomach pain  . Bilateral hip pain  . Abnormal ECG  . Hypertensive  cardiomyopathy  . Renal artery aneurysm  Post op ileus s/ Procedure(s) (LRB) with comments: LAPAROSCOPY DIAGNOSTIC (N/A) APPENDECTOMY LAPAROSCOPIC ()  Plan: 1. Continue with clears 2. Ambulate/OOB 3. Recheck abdominal films/labs in am. 4. Remove staples   LOS: 10 days    [name] Pager # [phone]  10/02/2012

## 2012-10-02 NOTE — Progress Notes (Signed)
Patient seen and examined.  He appears to be improving very slowly.  Will advance to full liquids.

## 2012-10-03 ENCOUNTER — Inpatient Hospital Stay (HOSPITAL_COMMUNITY): Payer: Medicare HMO

## 2012-10-03 LAB — CBC
HCT: 27.8 % — ABNORMAL LOW (ref 39.0–52.0)
Hemoglobin: 9 g/dL — ABNORMAL LOW (ref 13.0–17.0)
RDW: 13.5 % (ref 11.5–15.5)
WBC: 10.9 10*3/uL — ABNORMAL HIGH (ref 4.0–10.5)

## 2012-10-03 LAB — BASIC METABOLIC PANEL
BUN: 6 mg/dL (ref 6–23)
Chloride: 108 mEq/L (ref 96–112)
GFR calc non Af Amer: 56 mL/min — ABNORMAL LOW (ref >90)
Glucose, Bld: 108 mg/dL — ABNORMAL HIGH (ref 70–99)
Potassium: 4 mEq/L (ref 3.5–5.1)
Sodium: 141 mEq/L (ref 135–145)

## 2012-10-03 MED ORDER — PANTOPRAZOLE SODIUM 40 MG PO TBEC
40.0000 mg | DELAYED_RELEASE_TABLET | Freq: Every day | ORAL | Status: DC
  Administered 2012-10-04: 40 mg via ORAL
  Filled 2012-10-03: qty 1

## 2012-10-03 NOTE — Progress Notes (Signed)
Patient seen and examined.  Agree with PA's note.  

## 2012-10-03 NOTE — Progress Notes (Signed)
Patient complained of scrotal and leg edema. Elevated both. Notified MD On call. Will continue to monitor.

## 2012-10-03 NOTE — Progress Notes (Signed)
Physical Therapy Treatment Patient Details Name: Paul [name] MRN: [id number] DOB: 19-Aug-1961 Today's Date: 10/03/2012 Time: 8675-4492 PT Time Calculation (min): 27 min  PT Assessment / Plan / Recommendation Comments on Treatment Session  Pt progressing slowly. States he amb the unit several times a day and gets around his room to and from the bathroom w/o assist. Amb in hallway w/o AD this time.  Pt did well but needed several standing rests breaks to "catch his breath".    Follow Up Recommendations  Home health PT;Supervision - Intermittent     Does the patient have the potential to tolerate intense rehabilitation     Barriers to Discharge        Equipment Recommendations  Rolling walker with 5" wheels    Recommendations for Other Services    Frequency Min 3X/week   Plan Discharge plan remains appropriate    Precautions / Restrictions     Pertinent Vitals/Pain C/o "some" abdominal tightness/soreness/swelling    Mobility  Bed Mobility Bed Mobility: Not assessed Details for Bed Mobility Assistance: Pt OOB in recliner Transfers Transfers: Sit to Stand;Stand to Sit Sit to Stand: 6: Modified independent (Device/Increase time);From chair/3-in-1 Stand to Sit: 6: Modified independent (Device/Increase time);To chair/3-in-1 Details for Transfer Assistance: increased time with good safety tech Ambulation/Gait Ambulation/Gait Assistance: 6: Modified independent (Device/Increase time) Ambulation Distance (Feet): 500 Feet Assistive device: None Ambulation/Gait Assistance Details: amb w/o AD HHA this time twice around the unit. Gait Pattern: Step-through pattern Gait velocity: decreased with several standing rest breaks to "catch his breath".     PT Goals                                                             progressing    Visit Information  Last PT Received On: 10/03/12 Assistance Needed: +1    Subjective Data  Subjective: My stomach is still pretty sore     Cognition       Balance     End of Session PT - End of Session Equipment Utilized During Treatment: Gait belt Activity Tolerance: Patient tolerated treatment well Patient left: in chair;with call bell/phone within reach;with family/visitor present   Rica Koyanagi  PTA [facility]  Acute  Rehab Pager     [phone]

## 2012-10-03 NOTE — Progress Notes (Signed)
Patient ID: Paul [name], male   DOB: 04-Feb-1961, 51 y.o.   MRN: [id number] 11 Days Post-Op  Subjective: Generally feels better, + BM per patient. Less distention, more comfortable.   Objective: Vital signs in last 24 hours: Temp:  [97.7 F (36.5 C)-99.5 F (37.5 C)] 99.1 F (37.3 C) (11/26 0622) Pulse Rate:  [62-78] 77  (11/26 0622) Resp:  [18-20] 18  (11/26 0622) BP: (130-153)/(78-83) 142/79 mmHg (11/26 0622) SpO2:  [94 %-95 %] 94 % (11/26 0622) Last BM Date: 10/02/12  Intake/Output from previous day: 11/25 0701 - 11/26 0700 In: 4041.3 [P.O.:960; I.V.:2931.3; IV Piggyback:150] Out: 1897 [Urine:1800; Drains:95; Stool:2] Intake/Output this shift: Total I/O In: 702.5 [P.O.:240; I.V.:462.5] Out: -   General appearance: alert, cooperative, appears stated age and no distress Chest: CTA Cardiac: RRR Abdomen: abdominal drain (95 ml serous drainage) soft, remains slightly distended, +BS, BM x2 per recorded on I&O. All surgical wounds appear to be healing well, WBC has trended down, BUN and Creatine have improved. VSS,afebrile.  Lab Results:   Basename 10/03/12 0425 10/02/12 0430  WBC 10.9* 12.9*  HGB 9.0* 9.4*  HCT 27.8* 29.9*  PLT 384 348   BMET  Basename 10/03/12 0425 10/02/12 0430  NA 141 138  K 4.0 4.0  CL 108 107  CO2 25 23  GLUCOSE 108* 108*  BUN 6 8  CREATININE 1.40* 1.45*  CALCIUM 8.4 8.3*   PT/INR No results found for this basename: LABPROT:2,INR:2 in the last 72 hours ABG No results found for this basename: PHART:2,PCO2:2,PO2:2,HCO3:2 in the last 72 hours  Studies/Results: Dg Abd 1 View  10/03/2012  *RADIOLOGY REPORT*  Clinical Data: Evaluate ileus  ABDOMEN - 1 VIEW  Comparison: 10/02/2012; CT abdomen pelvis - 09/29/2012  Findings:  There is an overall decreased gaseous distension of previously identified dilated loops of small bowel with index loop within the mid aspect of the abdomen now measuring approximately 3.4 cm in diameter, previously 6.4 cm.   Air is seen within the ascending and transverse colon.  Evaluation for pneumoperitoneum is limited secondary to supine patient positioning exclusion of the lower thorax. No definite pneumatosis.  Several calcified diverticuli are seen overlying expected location of the descending colon.  An additional surgical drain overlies the left lower abdominal quadrant.  Grossly stable positioning of additional surgical drain overlying the left hemi pelvis.  Post right total hip replacement and the left femoral neck pain, incompletely imaged.  Severe left sided hip degenerative change.  IMPRESSION: 1.  Overall findings most suggestive of improving ileus or partial obstruction.  2.  New surgical drain overlies the left lower abdominal quadrant. Unchanged positioning of existing surgical drain overlying the left hemi pelvis.   Original Report Authenticated By: Tacey Ruiz, MD    Dg Abd 2 Views  10/02/2012  *RADIOLOGY REPORT*  Clinical Data: Follow up small bowel obstruction.  ABDOMEN - 2 VIEW  Comparison: Abdominal CT 09/29/2012  Findings: Upright and supine views of the abdomen again demonstrate dilated loops of small bowel with air-fluid levels.  Small bowel loops measure up to 6.1 cm.  There is a small amount contrast within the rectum and colon.  There is also a surgical drain in the pelvis.  Limited evaluation for free air on this examination. Extensive postsurgical changes in both hips and severe degenerative changes in the left hip.  There is patchy densities at the lung bases suggestive for volume loss.  IMPRESSION: Dilated loops of small bowel with air-fluid levels.  There is  a small amount of contrast in the colon.  Findings are suggestive for a high-grade partial small bowel obstruction.   Original Report Authenticated By: Markus Daft, M.D.     Anti-infectives: Anti-infectives     Start     Dose/Rate Route Frequency Ordered Stop   09/23/12 0600   ertapenem (INVANZ) 1 g in sodium chloride 0.9 % 50 mL IVPB   Status:  Discontinued        1 g 100 mL/hr over 30 Minutes Intravenous 60 min pre-op 09/22/12 1440 09/22/12 1846   09/22/12 2200   metroNIDAZOLE (FLAGYL) IVPB 500 mg  Status:  Discontinued        500 mg 100 mL/hr over 60 Minutes Intravenous Every 8 hours 09/22/12 1903 09/23/12 0804   09/22/12 2000   piperacillin-tazobactam (ZOSYN) IVPB 3.375 g        3.375 g 12.5 mL/hr over 240 Minutes Intravenous Every 8 hours 09/22/12 1903     09/22/12 1400   ertapenem (INVANZ) 1 g in sodium chloride 0.9 % 50 mL IVPB        1 g 100 mL/hr over 30 Minutes Intravenous  Once 09/22/12 1345 09/22/12 1630          Assessment/Plan:  Patient Active Problem List  Diagnosis  . DERMATOPHYTOSIS OF NAIL  . HYPERLIPIDEMIA  . ERECTILE DYSFUNCTION  . TOBACCO USE  . Other, mixed, or unspecified nondependent drug abuse, unspecified  . HYPERTENSION, BENIGN SYSTEMIC  . CARDIOMYOPATHY, HYPERTROPHIC  . ATRIAL FIBRILLATION WITH RAPID VENTRICULAR RESPONSE  . RHINITIS, ALLERGIC NOS  . GERD  . RENAL INSUFFICIENCY, CHRONIC  . SHOULDER PAIN, LEFT  . Pain in Joint, Pelvic Region and Thigh  . KNEE PAIN, RIGHT  . BACK PAIN  . Unspecified disorders of bursae and tendons in shoulder region  . ABNORMALITY OF GAIT  . Cough  . URI (upper respiratory infection)  . Stomach pain  . Bilateral hip pain  . Abnormal ECG  . Hypertensive cardiomyopathy  . Renal artery aneurysm  Post op ileus s/ Procedure(s) (LRB) with comments: LAPAROSCOPY DIAGNOSTIC (N/A) APPENDECTOMY LAPAROSCOPIC ()  Plan: 1. Advance dit to low residue 2. Ambulate/OOB 3. Follow clinical picture   LOS: 11 days    [name] Surgery Pager # [phone]  10/03/2012

## 2012-10-04 MED ORDER — OXYCODONE-ACETAMINOPHEN 5-325 MG PO TABS: 1.0000 | ORAL_TABLET | ORAL | Status: DC | PRN

## 2012-10-04 NOTE — Discharge Summary (Signed)
Patient seen and examined.  Agree with PA's note. Had a prolonged ileus which has resolved and he is ready for discharge.

## 2012-10-04 NOTE — Discharge Summary (Signed)
Physician Discharge Summary  Patient ID: Paul [name] MRN: [id number] DOB/AGE: 1961/10/19 51 y.o.  Admit date: 09/22/2012 Discharge date: 10/04/2012  Admission Diagnoses: Abdominal pain and nausea x 2 days   Discharge Diagnoses: s/p laparoscopic repair for perforated appendicitis.  Active Problems:  ATRIAL FIBRILLATION WITH RAPID VENTRICULAR RESPONSE  Abnormal ECG  Hypertensive cardiomyopathy  Renal artery aneurysm   Discharged Condition: stable  Hospital Course: Paul [name] is an 51 y.o. Who presented to [facility]  complaining of diffuse abdominal pain x2days. He denied change in bowel or bladder habits or N/V. Pt had been eating with mildly reduced PO intake. Pt endorsed subjective fever and chills. Pain is 10/10 worsening from 7/10 at onset, constant with exacerbations with movement. No h/o abdominal surgeries.  CT of the abdomen pelvis done on 09/22/12: IMPRESSION:  The study is positive for free fluid and free air. There is an  abnormal appearance of the appendix and the findings are highly  concerning for a perforated appendicitis. In addition, there are  small foci of extraluminal air along the left colon that could  represent acute diverticulitis in this area.  Abnormal appearance of both kidneys with diffuse cortical scarring  and abnormal enhancement. Findings could be related to previous  scarring but cannot exclude an inflammatory or infectious etiology  in the kidneys.  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 Recommend vascular surgery  consultation.  Indeterminate 1.1 cm low density structure in the right hepatic  lobe and question an adrenal nodule. These areas warrant  surveillance or further evaluation with MRI.  Critical Value/emergent results were called by telephone at the  time of interpretation on 09/22/2012 at 1:21 p.m.  to [facility], who verbally acknowledged these results.  Original Report Authenticated By: Richarda Overlie, M.D.  Based on these findings and clinical presentation, the patient was taken to the operating room for an emergent laparotomy by Dr. Magnus Ivan to determine whether this represented a perforated diverticulum versus appendicitis. Intraoperative finding was of a perforated appendicitis, requiring an appendectomy. A drain was left in the RLQ for drainage. His preoperative ECG showed LVH with significant T wave inversion in the lateral leads. This was new compared to 2009. Repeat ECG postoperatively showed improvement and T wave inversion. Cardiology consult was obtained with Dr. [place]. ASSESSMENT AND PLAN:  1. Abnormal ECG. Cardiac enzymes were all negative. No clinical symptoms of chest pain. ECG findings were similar in 2004 and 2006. I suspect this is related to long-standing hypertension and LVH.  2. Paroxysmal atrial fibrillation. Patient had a single episode in 2009 that was nonsustained.  3. Acute appendicitis with rupture. Status post surgery.  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 Patient had an abnormal ultrasound of the kidneys in 2006. Followup CT was recommended but never performed. I suspect this is a chronic finding but needs to be evaluated by vascular surgery.  5. Hypertension controlled on no prior medication.  Plan: We'll obtain an echocardiogram while here in the hospital. Blood pressure is controlled. We will hold metoprolol for now. Recommend vascular surgery consult either while in house or as an outpatient.  Echo done on 09/24/12: Study Conclusions - Left ventricle: The cavity size was normal. Wall thickness was increased in a pattern of moderate LVH. Systolic function was vigorous. The estimated ejection fraction was in the range of 65% to 70%. There  was dynamic obstruction, with mid-cavity obliteration. Wall motion was normal; there were no regional wall motion  abnormalities. Features are consistent with a pseudonormal left ventricular filling pattern, with concomitant abnormal relaxation and increased filling pressure (grade 2 diastolic dysfunction). - Left atrium: The atrium was moderately dilated. - Right atrium: The atrium was mildly to moderately dilated. Transthoracic echocardiography. M-mode, complete 2D, spectral Doppler, and color Doppler. Height: Height: 180.3cm. Height: 71in. Weight: Weight: 88kg. Weight: 193.6lb. Body mass index: BMI: 27.1kg/m^2. Body surface area: BSA: 2.84m2. Patient status: Inpatient. The patients A-fib was managed by cardiology and resolved on 09/26/12 converting to sinus rhythm. Post operatively the patient developed an ileus which was slow to resolve, he was managed with ABX, NPO, IVF and pain medication post op.  Eventually the patient progressed to the point where he had a return of bowel function, was tolerating a diet w/o N/V/D, his abdominal wounds have healed well, staples have been removed. His abdominal drain was able to be removed, pain is well managed. At this time he is stable for discharge to home/self care and has been instructed to follow up with Dr. BNinfa Lindenin 2 weeks time. He has been given written post op instructions as well as our contact number should he need to contact our office prior to his scheduled appointment.    Consults: cardiology, nutrition.  Significant Diagnostic Studies: labs, microbiology, radiology, and cardiac graphics.  Treatments: IV hydration, antibiotics, analgesia, anticoagulation,respiratory therapy,procedures,and surgery.  Discharge Exam: Blood pressure 145/77, pulse 62, temperature 99.2 F (37.3 C), temperature source Oral, resp. rate 20, height '5\' 11"'$  (1.803 m), weight 191 lb 2.2 oz (86.7 kg), SpO2 96.00%. General appearance: alert, cooperative, appears stated age and no distress Chest: CTA bilaterally Cardiac: RRR No M/R/G Abdomen: soft, non tender, steri  strips in place over surgical incision sites, Abdominal drain has been removed; dressing in place C/D/I. + BM,BS,flatus.   Extremities: slight dependent edema in feet bilaterally, + pulses, no tenderness.  Disposition: Home self care  Patient needs follow up as outpatient with vascular surgery for evaluation of renal artery aneurysm.  Discharge Orders    Future Orders Please Complete By Expires   Discharge patient      Comments:   To home self care  Patient is to follow up with Dr. BNinfa Lindenin 2 weeks time.       Medication List     As of 10/04/2012  9:10 AM    TAKE these medications         fluticasone 50 MCG/ACT nasal spray   Commonly known as: FLONASE   Place 1 spray into the nose daily.      montelukast 10 MG tablet   Commonly known as: SINGULAIR   TAKE 1 TABLET BY MOUTH AT BEDTIME      oxyCODONE-acetaminophen 5-325 MG per tablet   Commonly known as: PERCOCET/ROXICET   Take 1-2 tablets by mouth every 4 (four) hours as needed.      ranitidine 300 MG tablet   Commonly known as: ZANTAC   Take 300 mg by mouth at bedtime.      traMADol 50 MG tablet   Commonly known as: ULTRAM   Take 1 tablet (50 mg total) by mouth every 6 (six) hours as needed for pain.           Follow-up Information    [facility] weeks. (.  Please plan to arrive 30 minutes earlier than your scheduled appointment for check  in procedures.  Thank you)    Contact information:   [address] [place] [address] [phone]          Signed: Robinette Haines [facility] Surgery Pager [phone]  10/04/2012, 9:10 AM

## 2012-10-04 NOTE — Care Management Note (Signed)
    Page 1 of 2   10/04/2012     1:11:51 PM   CARE MANAGEMENT NOTE 10/04/2012  Patient:  CHERON, PASQUARELLI   Account Number:  [id number]  Date Initiated:  09/26/2012  Documentation initiated by:  DAVIS,RHONDA  Subjective/Objective Assessment:   PT HAD EXPAP ON DOA FOR PERFORATION OF BOWEL, A,FIB POST OP/CONTINUES TO HAVE POST OP ILEUS AND NG TUBE     Action/Plan:   FROM HOME   Anticipated DC Date:  10/05/2012   Anticipated DC Plan:  HOME/SELF CARE  In-house referral  NA      DC Planning Services  CM consult  PCP issues      PAC Choice  NA   Choice offered to / List presented to:  NA   DME arranged  NA      DME agency  NA     [place] arranged  NA      [place] agency  NA   Status of service:  Completed, signed off Medicare Important Message given?  NA - LOS <3 / Initial given by admissions (If response is "NO", the following Medicare IM given date fields will be blank) Date Medicare IM given:   Date Additional Medicare IM given:    Discharge Disposition:  HOME/SELF CARE  Per UR Regulation:  Reviewed for med. necessity/level of care/duration of stay  If discussed at [place] of Stay Meetings, dates discussed:    Comments:  10-04-12 Page with patient at bedside. RN states he did not have PCP. Patient states he does see a physician at [facility] but is not satisfied with the services. Provide patient with a list of alternative PCP resources. Also encourage patient to contact [facility] (# provided) or Customer Service # on insurance card to assist with finding another PCP. Patient was appreciative of help and will f/u with information provided.  [id number]/[id number] Rosana Hoes, RN, BSN, CCM: CHART REVIEWED AND UPDATED.  Next chart review due on [id number]. NO DISCHARGE NEEDS PRESENT AT THIS TIME. CASE MANAGEMENT [phone]

## 2012-10-09 ENCOUNTER — Telehealth (INDEPENDENT_AMBULATORY_CARE_PROVIDER_SITE_OTHER): Payer: Self-pay | Admitting: General Surgery

## 2012-10-09 ENCOUNTER — Telehealth (INDEPENDENT_AMBULATORY_CARE_PROVIDER_SITE_OTHER): Payer: Self-pay

## 2012-10-09 NOTE — Telephone Encounter (Signed)
Called patient and told him that he has a Rx percocet 5/325 and I made him an apt for him to come back in to see Dr Ninfa Linden on 10-18-12

## 2012-10-09 NOTE — Telephone Encounter (Signed)
Patient called in for percocet refill. Offered hydrocodone protocol refill but he wants percocet. Discharged 11/27.

## 2012-10-18 ENCOUNTER — Ambulatory Visit (INDEPENDENT_AMBULATORY_CARE_PROVIDER_SITE_OTHER): Payer: Medicare HMO | Admitting: Surgery

## 2012-10-18 ENCOUNTER — Other Ambulatory Visit (INDEPENDENT_AMBULATORY_CARE_PROVIDER_SITE_OTHER): Payer: Self-pay | Admitting: Surgery

## 2012-10-18 ENCOUNTER — Encounter: Payer: Self-pay | Admitting: Vascular Surgery

## 2012-10-18 ENCOUNTER — Encounter (INDEPENDENT_AMBULATORY_CARE_PROVIDER_SITE_OTHER): Payer: Self-pay | Admitting: Surgery

## 2012-10-18 VITALS — BP 134/88 | HR 88 | Temp 97.0°F | Resp 16 | Ht 71.5 in | Wt 171.4 lb

## 2012-10-18 DIAGNOSIS — Z09 Encounter for follow-up examination after completed treatment for conditions other than malignant neoplasm: Secondary | ICD-10-CM

## 2012-10-18 DIAGNOSIS — I722 Aneurysm of renal artery: Secondary | ICD-10-CM

## 2012-10-18 NOTE — Progress Notes (Signed)
Subjective:     Patient ID: Paul [name], male   DOB: [birth year], 51 y.o.   MRN: [id number]  HPI He is here for his first postoperative visit status post laparoscopic appendectomy performed on November 15 for severe perforated appendicitis. He had a prolonged hospital course which included atrial fibrillation. He was seen by the cardiologist at that time. At the time of admission he was also found to have a 5.8 cm right renal artery aneurysm. Unfortunately, he was not referred to the vascular surgeons as an inpatient and arrangements have not yet been made for him to see them as an outpatient. He still is having some significant pain in his incisions but denies any fevers. He is eating well and has had no nausea or vomiting  Review of Systems     Objective:   Physical Exam  on exam, his abdomen is soft his incisions are well healed.    Assessment:     Patient status post arthroscopic appendectomy    Plan:     From my standpoint, he may resume his normal activities including lifting. I renewed his Percocet. I will now need to refer him to vascular surgery for their opinion regarding his renal artery aneurysm.

## 2012-10-25 ENCOUNTER — Encounter: Payer: Self-pay | Admitting: Vascular Surgery

## 2012-10-26 ENCOUNTER — Ambulatory Visit: Payer: Medicare HMO | Admitting: Sports Medicine

## 2012-10-26 ENCOUNTER — Encounter: Payer: Self-pay | Admitting: Vascular Surgery

## 2012-10-26 ENCOUNTER — Ambulatory Visit (INDEPENDENT_AMBULATORY_CARE_PROVIDER_SITE_OTHER): Payer: Medicare HMO | Admitting: Vascular Surgery

## 2012-10-26 VITALS — BP 163/88 | HR 68 | Ht 71.5 in | Wt 177.0 lb

## 2012-10-26 DIAGNOSIS — I722 Aneurysm of renal artery: Secondary | ICD-10-CM

## 2012-10-26 NOTE — Progress Notes (Addendum)
[facility] [place] HISTORY AND PHYSICAL   History of Present Illness:  Patient is a 51 y.o. year old male who presents for evaluation of right renal artery aneurysm.  This was incidentally found on a CT scan of abdomen and pelvis for evaluation of appendicitis. The patient subsequently had a laparoscopic appendectomy for perforated appendicitis. He has now recovered from this. The CT scan was dated November of 2013. This showed diffuse enlargement of the right renal artery with multiple aneurysms out in the hilum of the right kidney. He denies any abdominal or flank pain. He does have a history of some renal insufficiency in the past and was seen by the nephrologists here in [place]. However review of his most recent serum creatinine shows a creatinine of 1.4  he smokes one to 2 cigarettes per day. Other medical problems include hypertension. He also is on disability from problems with both hips.  Past Medical History  Diagnosis Date  . Hypertensive cardiomyopathy   . Hypertension   . Chronic kidney disease   . Atrial fibrillation     Isolated episode    Past Surgical History  Procedure Date  . Hip surgery   . Total hip arthroplasty     x2  . Appendectomy   . Laparoscopy 09/22/2012    Procedure: LAPAROSCOPY DIAGNOSTIC;  Surgeon: Harl Bowie, MD;  Location: [facility] ORS;  Service: General;  Laterality: N/A;  . Laparoscopic appendectomy 09/22/2012    Procedure: APPENDECTOMY LAPAROSCOPIC;  Surgeon: Harl Bowie, MD;  Location: [facility] ORS;  Service: General;;     Social History History  Substance Use Topics  . Smoking status: Light Tobacco Smoker    Types: Cigarettes  . Smokeless tobacco: Never Used  . Alcohol Use: 1.2 oz/week    2 Cans of beer per week     Comment: occasional    Family History History reviewed. No pertinent family history.  Allergies  Allergies  Allergen Reactions  . Ibuprofen   . Nsaids     REACTION: nsaid induced nephropathy      Current Outpatient Prescriptions  Medication Sig Dispense Refill  . fluticasone (FLONASE) 50 MCG/ACT nasal spray Place 1 spray into the nose daily.  16 g  12  . montelukast (SINGULAIR) 10 MG tablet TAKE 1 TABLET BY MOUTH AT BEDTIME  30 tablet  1  . oxyCODONE-acetaminophen (PERCOCET/ROXICET) 5-325 MG per tablet Take 1-2 tablets by mouth every 4 (four) hours as needed.  30 tablet  0  . ranitidine (ZANTAC) 300 MG tablet Take 300 mg by mouth at bedtime.        . traMADol (ULTRAM) 50 MG tablet Take 1 tablet (50 mg total) by mouth every 6 (six) hours as needed for pain.  30 tablet  2    ROS:   General:  No weight loss, Fever, chills  HEENT: No recent headaches, no nasal bleeding, no visual changes, no sore throat  Neurologic: No dizziness, blackouts, seizures. No recent symptoms of stroke or mini- stroke. No recent episodes of slurred speech, or temporary blindness.  Cardiac: No recent episodes of chest pain/pressure, no shortness of breath at rest.  No shortness of breath with exertion.  Denies history of atrial fibrillation or irregular heartbeat  Vascular: No history of rest pain in feet.  No history of claudication.  No history of non-healing ulcer, No history of DVT   Pulmonary: No home oxygen, no productive cough, no hemoptysis,  No asthma or wheezing  Musculoskeletal:  [x ]  Arthritis, [ ]  Low back pain,  [ x] Joint pain  Hematologic:No history of hypercoagulable state.  No history of easy bleeding.  No history of anemia  Gastrointestinal: No hematochezia or melena,  No gastroesophageal reflux, no trouble swallowing  Urinary: [x ] chronic Kidney disease, [ ]  on HD - [ ]  MWF or [ ]  TTHS, [ ]  Burning with urination, [ ]  Frequent urination, [ ]  Difficulty urinating;   Skin: No rashes  Psychological: No history of anxiety,  No history of depression   Physical Examination  Filed Vitals:   10/26/12 1003  BP: 163/88  Pulse: 68  Height: 5' 11.5" (1.816 m)  Weight: 177 lb  (80.287 kg)  SpO2: 98%    Body mass index is 24.34 kg/(m^2).  General:  Alert and oriented, no acute distress HEENT: Normal Neck: No bruit or JVD Pulmonary: Clear to auscultation bilaterally Cardiac: Regular Rate and Rhythm without murmur Abdomen: Soft, non-tender, non-distended, no mass, healing laparoscopic appendectomy scars, no bruit Skin: No rash Extremity Pulses:  2+ radial, brachial, femoral, dorsalis pedis, posterior tibial pulses bilaterally Musculoskeletal: No deformity or edema  Neurologic: Upper and lower extremity motor 5/5 and symmetric  DATA: CT scan of abdomen and pelvis from November 2013 is reviewed. This shows an ectatic right renal artery. There are multiple aneurysmal dilations out in the right renal hilum. This measures 3 x 5 cm There is cortical thinning of both kidneys.   ASSESSMENT: Complex right renal artery aneurysm.  This is large enough to be at risk of rupture. Additionally there is risk of renal artery thrombosis. The arterial degeneration is complicated enough that this may require nephrectomy. However, I believe he should be seen at [facility] which has significant experience in complex renal artery repairs to see if the kidney may be salvageable.   PLAN:  Patient is referred to Dr. Mariana Single at [facility] vascular surgery.  Ruta Hinds, MD [facility] [place] Office: [phone] Pager: [phone]   Spoke with Dr Oletta Lamas by phone who agrees kidney is not salvageable.  Will contact patient regarding scheduling a right nephrectomy unless he prefers formal 2nd opinion at [place], MD [facility] [place]: [phone] Pager: [phone]

## 2012-11-12 ENCOUNTER — Telehealth (INDEPENDENT_AMBULATORY_CARE_PROVIDER_SITE_OTHER): Payer: Self-pay | Admitting: General Surgery

## 2012-11-12 MED ORDER — HYDROCODONE-ACETAMINOPHEN 5-325 MG PO TABS: 1.0000 | ORAL_TABLET | ORAL | Status: DC | PRN

## 2012-11-12 NOTE — Telephone Encounter (Signed)
Pt called with abdominal pain. He is s/p ruptured appendicitis with significant post op ileus/ was in hospital 3 weeks, discharged dec 11.  He started having central abdominal pain yesterday afternoon.  He denied n/v/dizziness/bloating.  Took several doses of Mylanta.  Had BM today. prob constipated.  Ran out of percocet 3 days ago.  I am calling in 20 vicodin.  I left message for Dr. Trevor Mace nurse to call in AM.  He may need imaging if not better.  I informed him that if he develops nausea/vomiting/worsening pain/dizziness/ flank pain, he should come to ED today.     He is seeing vascular surgeon at [facility] Wednesday for complex large renal artery aneurysm.

## 2012-11-13 ENCOUNTER — Telehealth (INDEPENDENT_AMBULATORY_CARE_PROVIDER_SITE_OTHER): Payer: Self-pay | Admitting: General Surgery

## 2012-11-13 NOTE — Telephone Encounter (Signed)
Called patient this morning to check on him and he stated that he is feeling a lot better. No fever, no nausea or vomiting, he had a good BM this morning. He stated that he was going to work at his part time job. And wanted something for pain and Dr Ninfa Linden was here and he stated that I can call him in some Vicodin. Called in at [facility] on [address] [phone]

## 2012-11-14 NOTE — Telephone Encounter (Signed)
Pt called to state his Rx cannot be located at the pharmacy for him to pick up.  As there are two [facility] on [address], verified he needs the one at [place].  Rx was called to the [facility] at [place].  Called the Hydrocodone 5/325 mg, # 30, 1-2 po Q4-6H prn pain, no refill to the [address] location:  [phone].  LMOM at the alternate location to return the Rx to stock and why.

## 2012-11-15 ENCOUNTER — Ambulatory Visit: Payer: Medicare HMO | Admitting: Family Medicine

## 2012-11-15 DIAGNOSIS — I722 Aneurysm of renal artery: Secondary | ICD-10-CM

## 2012-11-15 HISTORY — DX: Aneurysm of renal artery: I72.2

## 2012-11-23 ENCOUNTER — Telehealth (INDEPENDENT_AMBULATORY_CARE_PROVIDER_SITE_OTHER): Payer: Self-pay | Admitting: General Surgery

## 2012-11-23 ENCOUNTER — Encounter: Payer: Self-pay | Admitting: *Deleted

## 2012-11-23 DIAGNOSIS — M25559 Pain in unspecified hip: Secondary | ICD-10-CM

## 2012-11-23 NOTE — Telephone Encounter (Signed)
Pt called to request additional pain meds.  Last filled with Vicodin on 11/13/12.  (Pt cannot take NSAIDs .)  Paged and updated Dr. Ninfa Linden, who approved another Hydrocodone 5/325 mg, # 30, 1-2 po Q4-6H prn pain, no refill.  Called to [place] [facility] [place]:  [phone].  Pt is aware.

## 2012-11-23 NOTE — Progress Notes (Signed)
Patient ID: Paul [name], male   DOB: 06-Oct-1961, 52 y.o.   MRN: [id number]   Pt requesting a new order for PT- he was unable to go when he was last referred d/t recent surgery.   Refilled pt's tramadol per Dr. Micheline Chapman.

## 2012-11-24 ENCOUNTER — Other Ambulatory Visit: Payer: Self-pay | Admitting: *Deleted

## 2012-11-24 DIAGNOSIS — M25552 Pain in left hip: Secondary | ICD-10-CM

## 2012-11-24 MED ORDER — TRAMADOL HCL 50 MG PO TABS: 50.0000 mg | ORAL_TABLET | Freq: Four times a day (QID) | ORAL | Status: DC | PRN

## 2012-12-06 ENCOUNTER — Other Ambulatory Visit (INDEPENDENT_AMBULATORY_CARE_PROVIDER_SITE_OTHER): Payer: Self-pay | Admitting: Surgery

## 2012-12-06 ENCOUNTER — Telehealth (INDEPENDENT_AMBULATORY_CARE_PROVIDER_SITE_OTHER): Payer: Self-pay | Admitting: General Surgery

## 2012-12-06 ENCOUNTER — Other Ambulatory Visit: Payer: Self-pay | Admitting: Sports Medicine

## 2012-12-06 DIAGNOSIS — R109 Unspecified abdominal pain: Secondary | ICD-10-CM

## 2012-12-06 NOTE — Telephone Encounter (Signed)
Discussed with Dr. Ninfa Linden.  No further refill of narcotics will be authorized.  Pt updated with this and advised to either go to PCP or ER for evaluation for pain that cannot be resolved with OTC meds.  Also pt does not need to see Dr. Ninfa Linden again, as the surgery has healed (from 2 months ago.)  Pt quite unhappy with message from MD and wants Dr. Ninfa Linden to call him.  Told pt I would pass on the message.

## 2012-12-06 NOTE — Telephone Encounter (Signed)
Pt called to ask for additional pain meds.  Recently refilled with Vicodin.  Pt states he is working "extra jobs" and is still having a lot of pain.  States he will come back in to see Dr. Ninfa Linden if he needs to, but is really in pain.  Allergic to NSAIDs.  Please advise and call him on his cell # [phone].

## 2012-12-07 ENCOUNTER — Other Ambulatory Visit: Payer: Medicare HMO

## 2012-12-07 ENCOUNTER — Other Ambulatory Visit: Payer: Self-pay | Admitting: *Deleted

## 2012-12-07 DIAGNOSIS — M25551 Pain in right hip: Secondary | ICD-10-CM

## 2012-12-07 MED ORDER — TRAMADOL HCL 50 MG PO TABS: 50.0000 mg | ORAL_TABLET | Freq: Four times a day (QID) | ORAL | Status: DC | PRN

## 2012-12-13 ENCOUNTER — Ambulatory Visit: Payer: Medicare HMO | Attending: Family Medicine

## 2012-12-13 DIAGNOSIS — M25659 Stiffness of unspecified hip, not elsewhere classified: Secondary | ICD-10-CM | POA: Insufficient documentation

## 2012-12-13 DIAGNOSIS — IMO0001 Reserved for inherently not codable concepts without codable children: Secondary | ICD-10-CM | POA: Insufficient documentation

## 2012-12-13 DIAGNOSIS — M25559 Pain in unspecified hip: Secondary | ICD-10-CM | POA: Insufficient documentation

## 2012-12-13 DIAGNOSIS — R269 Unspecified abnormalities of gait and mobility: Secondary | ICD-10-CM | POA: Insufficient documentation

## 2012-12-18 ENCOUNTER — Ambulatory Visit: Payer: Medicare HMO | Admitting: Rehabilitative and Restorative Service Providers"

## 2012-12-20 ENCOUNTER — Ambulatory Visit: Payer: Medicare HMO | Admitting: Family Medicine

## 2012-12-25 DIAGNOSIS — M93003 Unspecified slipped upper femoral epiphysis (nontraumatic), unspecified hip: Secondary | ICD-10-CM

## 2012-12-25 DIAGNOSIS — Z96649 Presence of unspecified artificial hip joint: Secondary | ICD-10-CM

## 2012-12-25 HISTORY — DX: Presence of unspecified artificial hip joint: Z96.649

## 2012-12-25 HISTORY — DX: Unspecified slipped upper femoral epiphysis (nontraumatic), unspecified hip: M93.003

## 2012-12-26 ENCOUNTER — Other Ambulatory Visit: Payer: Self-pay | Admitting: *Deleted

## 2012-12-26 MED ORDER — FLUTICASONE PROPIONATE 50 MCG/ACT NA SUSP: 1.0000 | Freq: Every day | NASAL | Status: DC

## 2012-12-27 ENCOUNTER — Ambulatory Visit: Payer: Medicare HMO | Admitting: Family Medicine

## 2012-12-27 ENCOUNTER — Ambulatory Visit (INDEPENDENT_AMBULATORY_CARE_PROVIDER_SITE_OTHER): Payer: Medicare HMO | Admitting: Family Medicine

## 2012-12-27 VITALS — BP 120/76 | Ht 71.0 in | Wt 180.0 lb

## 2012-12-27 DIAGNOSIS — M79609 Pain in unspecified limb: Secondary | ICD-10-CM

## 2012-12-27 DIAGNOSIS — M25559 Pain in unspecified hip: Secondary | ICD-10-CM

## 2012-12-27 DIAGNOSIS — M25551 Pain in right hip: Secondary | ICD-10-CM

## 2012-12-27 DIAGNOSIS — M79641 Pain in right hand: Secondary | ICD-10-CM | POA: Insufficient documentation

## 2012-12-27 MED ORDER — TRAMADOL HCL 50 MG PO TABS: 50.0000 mg | ORAL_TABLET | Freq: Four times a day (QID) | ORAL | Status: DC | PRN

## 2012-12-27 NOTE — Assessment & Plan Note (Signed)
Pain at the right first [facility] following a fall one year ago.  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  Plan: Wrist and hand x-rays Followup in one to 4 weeks for evaluation and management.

## 2012-12-27 NOTE — Patient Instructions (Addendum)
Thank you for coming in today. Please schedule that hip replacement.  Xrays of your right hand today.  Come back in 1-4 weeks to discuss your hand pain.  Call or go to the ER if you develop a large red swollen joint with extreme pain or oozing puss.

## 2012-12-27 NOTE — Progress Notes (Signed)
Paul [name] is a 52 y.o. male who presents to [facility] today for  left hip pain. This is long standing, but has been getting worse over the last year.   he ultrasound-guided left intra-articular hip injection in November of 2013 which worked very well for 2 months. His pain is worsened and he would like another one today.  He plans to have hip replacement in the summer after the tax season is over. He notes the tramadol was helpful and would like a refill of possible.     Has very limited ROM in his left hip and it is hard to bear weight on the left due to pain. Also has weakness and decreased ROM in the right hip, but worse on the left. Has done PT in the past but not for several years. Has been on percocet, which helped, but not for the last several years. Used to take ibuprofen $RemoveBefore'800mg'DNaZcsUeMnKDe$ , but it caused some kidney damage. Also had injections in the past which helped, but again this was several years ago.  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 Revision in 2001 that was then revised in 2003 by Dr. Margo Aye at [facility]. Left hip had a screw placed at about age 52 for the same issue.  Of note, his son had to have a pin placed in his hip about 2 years ago.    Additionally patient notes right hand thumb pain. He fell about one year ago he notes continued right CMC thumb pain.  He denies any evaluation for this. He notes pain is worse with activity better with rest. Tramadol is helpful for pain. No radiating pain weakness or numbness .   PMH reviewed. As above History  Substance Use Topics  . Smoking status: Light Tobacco Smoker    Types: Cigarettes  . Smokeless tobacco: Never Used  . Alcohol Use: 1.2 oz/week    2 Cans of beer per week     Comment: occasional   ROS as above otherwise neg   Exam:  BP 120/76  Ht $R'5\' 11"'gJ$  (1.803 m)  Wt 180 lb (81.647 kg)  BMI 25.12 kg/m2 Gen: Well NAD MSK: Left Hip:  ROM  IR: 2 Deg, ER: 3 Deg, Flexion: 90 Deg   Decreased strength in hip flexors, 5/5 in knee extension, plantar and dorsi-flexion  Right Hip:  ROM  IR: 2 Deg, ER: 3 Deg, Flexion: 90 Deg  Decreased strength in hip flexors, 4/5 in knee extension, 5/5 in plantar and dorsi-flexion  Right hand: Normal-appearing tender palpation over the [facility] of the right thumb. Nontender in the anatomical snuff box. Normal motion pulses capillary refill and strength.   Muscle skeletal ultrasound of the left hip:  Femoral acetabular joint identified and probe position marked.  Great vessels identified and marked medially.   Procedure note ultrasound guided injection of left hip  Consent obtained and timeout performed.  Landmarks identified and probe position marked. As above.  The skin was then cleaned with alcohol and 4 mL of lidocaine without epinephrine were used to anesthetize the planned needle track.  We waited 5 minutes for full anesthetic effect to start.  The probe was sterilized and covered with a sterile Tegaderm.  The patient's skin was then sterilized with alcohol swabs.  Sterile jelly was used to again locate the femoral acetabular joint.  A 22-gauge spinal needle was used to access the femoral acetabular joint onder ultrasound guidance.  A small amount of fluid  was injected seen distended the capsule and a total of 40 mg of Depo-Medrol and 3 mL of Marcaine were injected into the hip joint under ultrasound guidance.  No bleeding patient noted significant relief of pain following the injection.

## 2012-12-27 NOTE — Assessment & Plan Note (Signed)
Repeat interarticular hip injection today. Refill tramadol. Recommend patient followup with orthopedics for hip replacement as the hip injections are becoming less effective.  Patient expresses understanding and agreement.

## 2013-01-01 ENCOUNTER — Ambulatory Visit: Payer: Medicare HMO | Admitting: Physical Therapy

## 2013-01-02 ENCOUNTER — Ambulatory Visit: Payer: Medicare HMO | Admitting: Physical Therapy

## 2013-01-03 ENCOUNTER — Ambulatory Visit: Payer: Medicare HMO | Admitting: Family Medicine

## 2013-01-08 ENCOUNTER — Ambulatory Visit: Payer: Medicare HMO | Attending: Family Medicine | Admitting: Physical Therapy

## 2013-01-08 DIAGNOSIS — IMO0001 Reserved for inherently not codable concepts without codable children: Secondary | ICD-10-CM | POA: Insufficient documentation

## 2013-01-08 DIAGNOSIS — M25559 Pain in unspecified hip: Secondary | ICD-10-CM | POA: Insufficient documentation

## 2013-01-08 DIAGNOSIS — M25659 Stiffness of unspecified hip, not elsewhere classified: Secondary | ICD-10-CM | POA: Insufficient documentation

## 2013-01-08 DIAGNOSIS — R269 Unspecified abnormalities of gait and mobility: Secondary | ICD-10-CM | POA: Insufficient documentation

## 2013-01-10 ENCOUNTER — Ambulatory Visit: Payer: Medicare HMO | Admitting: Physical Therapy

## 2013-01-15 ENCOUNTER — Ambulatory Visit: Payer: Medicare HMO | Admitting: Physical Therapy

## 2013-01-17 ENCOUNTER — Ambulatory Visit: Payer: Medicare HMO | Admitting: Physical Therapy

## 2013-01-18 ENCOUNTER — Telehealth: Payer: Self-pay | Admitting: *Deleted

## 2013-01-18 ENCOUNTER — Ambulatory Visit: Payer: Medicare HMO | Admitting: Sports Medicine

## 2013-01-18 NOTE — Telephone Encounter (Signed)
Message left on our office voicemail---patient requesting refill of Viagra 100 mg.  Uses [employer].

## 2013-01-20 ENCOUNTER — Ambulatory Visit (HOSPITAL_COMMUNITY)
Admission: AD | Admit: 2013-01-20 | Discharge: 2013-01-20 | Disposition: A | Discharge status: Home / Self Care | Payer: Medicare HMO | Source: Ambulatory Visit | Attending: Family Medicine | Admitting: Family Medicine

## 2013-01-20 ENCOUNTER — Ambulatory Visit (HOSPITAL_COMMUNITY)
Admission: RE | Admit: 2013-01-20 | Discharge: 2013-01-20 | Disposition: A | Discharge status: Home / Self Care | Payer: Medicare HMO | Source: Ambulatory Visit | Attending: Family Medicine | Admitting: Family Medicine

## 2013-01-20 DIAGNOSIS — M79609 Pain in unspecified limb: Secondary | ICD-10-CM | POA: Insufficient documentation

## 2013-01-20 DIAGNOSIS — M25539 Pain in unspecified wrist: Secondary | ICD-10-CM | POA: Insufficient documentation

## 2013-01-20 DIAGNOSIS — M79641 Pain in right hand: Secondary | ICD-10-CM

## 2013-01-20 MED ORDER — SILDENAFIL CITRATE 100 MG PO TABS: 100.0000 mg | ORAL_TABLET | Freq: Every day | ORAL | Status: DC | PRN

## 2013-01-20 NOTE — Telephone Encounter (Signed)
LVM notifying Rx Viagra sent.  He has not been seen by 12/2011. Advised he make appointment for physical within next few months or before next Viagra refill needed.

## 2013-01-23 ENCOUNTER — Telehealth: Payer: Self-pay | Admitting: Family Medicine

## 2013-01-23 MED ORDER — SILDENAFIL CITRATE 100 MG PO TABS: 100.0000 mg | ORAL_TABLET | Freq: Every day | ORAL | Status: DC | PRN

## 2013-01-24 ENCOUNTER — Encounter: Payer: Self-pay | Admitting: Family Medicine

## 2013-01-24 ENCOUNTER — Ambulatory Visit (INDEPENDENT_AMBULATORY_CARE_PROVIDER_SITE_OTHER): Payer: Medicare HMO | Admitting: Family Medicine

## 2013-01-24 ENCOUNTER — Ambulatory Visit: Payer: Medicare HMO | Admitting: Physical Therapy

## 2013-01-24 VITALS — BP 163/107 | HR 76 | Ht 71.0 in | Wt 180.0 lb

## 2013-01-24 DIAGNOSIS — M79609 Pain in unspecified limb: Secondary | ICD-10-CM

## 2013-01-24 DIAGNOSIS — M79641 Pain in right hand: Secondary | ICD-10-CM

## 2013-01-24 MED ORDER — HYDROCODONE-ACETAMINOPHEN 5-325 MG PO TABS: 1.0000 | ORAL_TABLET | Freq: Four times a day (QID) | ORAL | Status: DC | PRN

## 2013-01-24 MED ORDER — TRIAMCINOLONE ACETONIDE 10 MG/ML IJ SUSP
10.0000 mg | Freq: Once | INTRAMUSCULAR | Status: AC
  Administered 2013-01-24: 10 mg via INTRA_ARTICULAR

## 2013-01-24 NOTE — Progress Notes (Signed)
Paul [name] is a 52 y.o. male who presents to [facility] today for right hand pain and bilateral hip pain. 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 He has a hip replacement scheduled and has had ultrasound-guided femoral acetabular injections which worked well in the past. His primary issue today is right hand pain that was stable at the last visit. He notes pain at the base of his right thumb into the thenar eminence. His pain is worse with thumb adduction. He notes that using crutches and writing are particularly painful.  Additionally he notes for his overall pain tramadol is no longer effective in request on her pain medication. He denies any radiating pain weakness numbness fevers or chills.  PMH reviewed. Hip replacement History  Substance Use Topics  . Smoking status: Light Tobacco Smoker    Types: Cigarettes  . Smokeless tobacco: Never Used  . Alcohol Use: 1.2 oz/week    2 Cans of beer per week     Comment: occasional   ROS as above otherwise neg   Exam:  BP 163/107  Pulse 76  Ht $R'5\' 11"'tO$  (1.803 m)  Wt 180 lb (81.647 kg)  BMI 25.12 kg/m2 Gen: Well NAD MSK: Right hand. Normal-appearing mildly tender at the first [facility] joint. Nontender in the anatomical snuff box. Normal thumb motion capillary refill sensation. Grip strength is intact. No thenar wasting.   Limited musculoskeletal ultrasound of the right first CMC: Irregular cortical surface indicative of DJD. Joint effusion present. Tendinous structures appear to be normal and intact.  Ultrasound-guided right first [place] injection: Consent obtained and timeout performed. Ultrasound of the first [facility] structure identified noted above. Skin sterilized and probe sterilized with bleach solution. Tegaderm applied over top of the probe in a sterile fashion.  Sterile gel used in the first [facility] was again identified.  Axial traction applied and a 30-gauge 5/8 inch needle was used to access the  [facility] under ultrasound guidance.  5 mg of Depo-Medrol and 0.5 mL of Marcaine solution were injected into the joint. Patient tolerated procedure well.  Dg Wrist Complete Right  01/20/2013  *RADIOLOGY REPORT*  Clinical Data: Right hand and wrist pain.  Evaluate snuffbox.  RIGHT WRIST - COMPLETE 3+ VIEW  Comparison: Right hand 01/20/2013  Findings: There is irregularity of the first carpometacarpal joint and involving the distal aspect of the scaphoid, all likely degenerative.  No evidence for acute fracture.  There is mild subluxation at the first carpometacarpal joint, also likely degenerative.  No radiopaque foreign body or soft tissue gas identified.  IMPRESSION: Degenerative changes as described.  No acute fracture.   Original Report Authenticated By: Nolon Nations, M.D.    Dg Hand Complete Right  01/20/2013  *RADIOLOGY REPORT*  Clinical Data: Per the right hand and wrist pain.  Evaluate snuffbox.  No recent injury.  Fall a few months ago.  Using crutches for hip surgery.  RIGHT HAND - COMPLETE 3+ VIEW  Comparison: None.  Findings: Three views are performed, showing degenerative changes of the interphalangeal joint of the thumb and at the first carpometacarpal joint.  No evidence for acute fracture or dislocation.  No suspicious lytic or blastic lesions.  Degenerative changes are identified at the third proximal interphalangeal joint.  IMPRESSION:  1.  Degenerative changes in the region of patient's pain, likely accounting for symptoms. 2.  No acute fracture.   Original Report Authenticated By: Nolon Nations, M.D.

## 2013-01-24 NOTE — Patient Instructions (Addendum)
Thank you for coming in today. I think your hand pain is from the joint we injected.  Lets see how it goes.  Come back in a month unless you are a lot better.  Call or go to the ER if you develop a large red swollen joint with extreme pain or oozing puss.  Take the norco as needed for pain.  Follow up with Dr. Verdie Drown for further pain medication refills.

## 2013-01-24 NOTE — Assessment & Plan Note (Signed)
Pain at the right first [facility] following a fall one year ago.  Hand x-ray shows DJD of the first [facility] without any scaphoid fracture. Suspect pain in the thenar eminence from first [place] DJD.  Plan: Ultrasound guided injection Hand therapy with physical therapy Followup as needed  Hydrocodone for pain

## 2013-01-26 ENCOUNTER — Ambulatory Visit: Payer: Medicare HMO | Admitting: Physical Therapy

## 2013-01-31 ENCOUNTER — Ambulatory Visit: Payer: Medicare HMO | Admitting: Physical Therapy

## 2013-02-02 ENCOUNTER — Ambulatory Visit: Payer: Medicare HMO | Admitting: Physical Therapy

## 2013-02-05 ENCOUNTER — Encounter: Payer: Self-pay | Admitting: Family Medicine

## 2013-02-05 ENCOUNTER — Ambulatory Visit (INDEPENDENT_AMBULATORY_CARE_PROVIDER_SITE_OTHER): Payer: Medicare HMO | Admitting: Family Medicine

## 2013-02-05 VITALS — BP 149/87 | HR 117 | Temp 98.4°F | Ht 71.0 in | Wt 179.2 lb

## 2013-02-05 DIAGNOSIS — E785 Hyperlipidemia, unspecified: Secondary | ICD-10-CM

## 2013-02-05 DIAGNOSIS — Z0000 Encounter for general adult medical examination without abnormal findings: Secondary | ICD-10-CM

## 2013-02-05 DIAGNOSIS — R000 Tachycardia, unspecified: Secondary | ICD-10-CM

## 2013-02-05 DIAGNOSIS — I498 Other specified cardiac arrhythmias: Secondary | ICD-10-CM

## 2013-02-05 DIAGNOSIS — I421 Obstructive hypertrophic cardiomyopathy: Secondary | ICD-10-CM

## 2013-02-05 DIAGNOSIS — I722 Aneurysm of renal artery: Secondary | ICD-10-CM

## 2013-02-05 DIAGNOSIS — I10 Essential (primary) hypertension: Secondary | ICD-10-CM

## 2013-02-05 DIAGNOSIS — I4891 Unspecified atrial fibrillation: Secondary | ICD-10-CM

## 2013-02-05 LAB — COMPREHENSIVE METABOLIC PANEL
AST: 16 U/L (ref 0–37)
Alkaline Phosphatase: 59 U/L (ref 39–117)
BUN: 29 mg/dL — ABNORMAL HIGH (ref 6–23)
Glucose, Bld: 91 mg/dL (ref 70–99)
Sodium: 141 mEq/L (ref 135–145)
Total Bilirubin: 1.2 mg/dL (ref 0.3–1.2)
Total Protein: 7.2 g/dL (ref 6.0–8.3)

## 2013-02-05 LAB — LIPID PANEL
HDL: 60 mg/dL (ref >39)
LDL Cholesterol: 121 mg/dL — ABNORMAL HIGH (ref 0–99)
Triglycerides: 70 mg/dL (ref <150)
VLDL: 14 mg/dL (ref 0–40)

## 2013-02-05 NOTE — Patient Instructions (Addendum)
Please make an appointment for a lab visit in the next few days (make it in the morning, come in fasting)  Follow-up in 1 week to discuss lab work, renal artery aneurysm, heart rate  Another visit to discuss left hip pain

## 2013-02-05 NOTE — Progress Notes (Signed)
  Subjective:    Patient ID: Paul [name], male    DOB: 15-Sep-1961, 52 y.o.   MRN: [id number]  HPI Here for physical today.   # Hip pain # Erectile dysfunction  # Elevated blood pressures in the past   He is not on anti-hypertensives now. He had been on  metoprolol in the past; 3-4 years ago.  # HLD   No recent lipid panel   He ate some pizza prior to office visit  # Hypertensive cardiomyopathy    ECHO 09/2012 08-14%, grade 2 diastolic dysfunction  # Renal artery aneurysm, chronic renal insufficiency   It was incidental findings on imaging. He saw someone at [facility] (Dr. Oletta Lamas) and Dr. Ruta Hinds with [facility]. Mina Marble ran blood work and told he was fine.    He was told by Dr. Oletta Lamas that it was not an aneurysm.  # Tobacco use--1 ppw  # Right hand pain--being followed by [facility]  # History of atrial fibrillation during hospitalization 09/2012 and in 2009. 09/2012 attributed to appendicitis and ileus.    He "did not feel right" with metoprolol in the past. He felt weird and tired.    ROS: denies palpitations, chest pain   Review of Systems Endorsing urinary frequency   Allergies, medication, past medical history reviewed.  Smoking status noted.     Objective:   Physical Exam GEN: NAD GAIT: antalgic; uses effort to move around due to chronic hip and back pain  CV: tachycardic, regular rhythm, no murmurs PULM: NI WOB; CTAB without w/r/r ABD: soft, NT, ND BACK: no CVA tenderness EXT: no edema     Assessment & Plan:

## 2013-02-06 DIAGNOSIS — R000 Tachycardia, unspecified: Secondary | ICD-10-CM | POA: Insufficient documentation

## 2013-02-06 NOTE — Assessment & Plan Note (Signed)
-  He was advised to make a lab visit to get lipids checked>>>LDL 120s. Only risk factor is potential RAA. Also consider low dose aspirin (tobacco use, hypertension, ?RAA).

## 2013-02-06 NOTE — Assessment & Plan Note (Signed)
Diagnosis unclear.  -ROI signed today to get records from [place]. If he does have this diagnosis, will recommend follow-up with [facility] Dr. [name].

## 2013-02-06 NOTE — Assessment & Plan Note (Addendum)
He has not been seen at our clinic since 02/2011 and most of the visit was spent updating his chart. -He is overdue for colonoscopy. We did not have time to discuss this today. We will try to broach at next visit in a week.  -See above regarding RAA diagnosis -He is tachycardic today. See above.  -See above regarding HLD -He mentioned hip pain, his need for hip replacement. He was advised to make a follow-up visit to discuss pain control.

## 2013-02-06 NOTE — Assessment & Plan Note (Signed)
He is asymptomatic. See above regarding history of AF. He appears to be in regular rhythm at this time.  -Monitor. Last HR recently was normal.  -Consider beta-blocker at next visit if he remains tachycardic and hypertensive.  -Follow-up within a week to discuss further.  -Red signs/indications to go to ED discussed

## 2013-02-07 ENCOUNTER — Telehealth: Payer: Self-pay | Admitting: Family Medicine

## 2013-02-07 DIAGNOSIS — Q2734 Arteriovenous malformation of renal vessel: Secondary | ICD-10-CM | POA: Insufficient documentation

## 2013-02-07 DIAGNOSIS — E785 Hyperlipidemia, unspecified: Secondary | ICD-10-CM

## 2013-02-07 NOTE — Assessment & Plan Note (Signed)
Received notes from [facility]. 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 It was recommended he be followed by nephrologist especially with his underlying chronic renal disease.  Cr is stable at this time. Stage 3 CKD.  We will refer to nephrology.

## 2013-02-07 NOTE — Assessment & Plan Note (Signed)
Regarding his cholesterol, I would like LDL goal to be <100 due to renal vascular disease. He would like to try lifestyle modification at this time and would like to defer starting cholesterol medication LDL 121. Re-check cholesterol in 3 months.

## 2013-02-07 NOTE — Telephone Encounter (Signed)
#   Received notes from [facility].  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 It was recommended he be followed by nephrologist especially with his underlying chronic renal disease.  Cr is stable at this time. Stage 3 CKD. We will refer to nephrology.   Called patient and he is amenable.   # Regarding his cholesterol, I would like LDL goal to be <100 due to renal vascular disease. He would like to try lifestyle modification at this time and would like to defer starting cholesterol medication LDL 121. Re-check cholesterol in 3 months.    # He is wondering about status of Viagra authorization. I will defer to blue team nurse who is aware of this.

## 2013-02-08 ENCOUNTER — Telehealth: Payer: Self-pay | Admitting: *Deleted

## 2013-02-08 NOTE — Telephone Encounter (Signed)
Called [employer] for prior authorization for Viagra $RemoveBe'100mg'cTsuGKYLb$ .  [phone].  Request was denied.  Medication is excluded from Medicare benefits.  A fax regarding denial will be sent to [place] and one mailed to the patient.  Attempted to call patient and no answer and voice mail box full.  Karoline Caldwell, Loralyn Freshwater

## 2013-02-09 ENCOUNTER — Ambulatory Visit: Payer: Medicare HMO | Admitting: Physical Therapy

## 2013-02-09 NOTE — Telephone Encounter (Signed)
Notified patient of Viagra being denied.

## 2013-03-12 ENCOUNTER — Telehealth: Payer: Self-pay | Admitting: *Deleted

## 2013-03-12 NOTE — Telephone Encounter (Signed)
Message copied by Ocie Bob on Mon Mar 12, 2013  1:41 PM ------      Message from: Gregor Hams      Created: Mon Mar 12, 2013  1:15 PM       Please ask the patient to return for a visit for pain medication refills.       Thanks,            Ellard Artis      ----- Message -----         From: Ocie Bob, RN         Sent: 03/08/2013  11:15 AM           To: Gregor Hams, MD            Wants vicodin refill.       ------

## 2013-03-12 NOTE — Telephone Encounter (Signed)
Left pt a VM to return for a f/u for pain med refill.

## 2013-04-04 ENCOUNTER — Ambulatory Visit: Payer: Medicare HMO | Admitting: Family Medicine

## 2013-05-24 ENCOUNTER — Telehealth: Payer: Self-pay | Admitting: *Deleted

## 2013-05-24 NOTE — Telephone Encounter (Signed)
Pam at [facility] left message stating they have attempted to contact patient to schedule his appointment.  They have left messages on 05/08/2013,  05/17/2013 & 05/21/2013 and have not received any return calls.  Their office policy is that they will try to contact patient 3 times and no more.  A new referral will need to be made because they will be discarding this one.  I also attempted to call patient and got his voicemail that states his mailbox is full and I was unable to leave a message.   Lauralyn Primes

## 2013-09-12 ENCOUNTER — Other Ambulatory Visit: Payer: Self-pay | Admitting: Family Medicine

## 2013-11-28 ENCOUNTER — Other Ambulatory Visit: Payer: Self-pay | Admitting: Family Medicine

## 2014-07-18 ENCOUNTER — Other Ambulatory Visit: Payer: Self-pay | Admitting: Orthopedic Surgery

## 2014-07-23 ENCOUNTER — Encounter: Payer: Self-pay | Admitting: Family Medicine

## 2014-07-23 ENCOUNTER — Ambulatory Visit (INDEPENDENT_AMBULATORY_CARE_PROVIDER_SITE_OTHER): Payer: Medicare HMO | Admitting: Family Medicine

## 2014-07-23 VITALS — Temp 98.0°F | Wt 174.0 lb

## 2014-07-23 DIAGNOSIS — Q278 Other specified congenital malformations of peripheral vascular system: Secondary | ICD-10-CM

## 2014-07-23 DIAGNOSIS — Q2734 Arteriovenous malformation of renal vessel: Secondary | ICD-10-CM

## 2014-07-23 DIAGNOSIS — N19 Unspecified kidney failure: Secondary | ICD-10-CM

## 2014-07-23 DIAGNOSIS — M25552 Pain in left hip: Secondary | ICD-10-CM

## 2014-07-23 DIAGNOSIS — M26609 Unspecified temporomandibular joint disorder, unspecified side: Secondary | ICD-10-CM

## 2014-07-23 DIAGNOSIS — F528 Other sexual dysfunction not due to a substance or known physiological condition: Secondary | ICD-10-CM

## 2014-07-23 DIAGNOSIS — M25559 Pain in unspecified hip: Secondary | ICD-10-CM

## 2014-07-23 DIAGNOSIS — M25551 Pain in right hip: Secondary | ICD-10-CM

## 2014-07-23 HISTORY — DX: Unspecified temporomandibular joint disorder, unspecified side: M26.609

## 2014-07-23 LAB — COMPREHENSIVE METABOLIC PANEL
ALK PHOS: 54 U/L (ref 39–117)
ALT: 15 U/L (ref 0–53)
AST: 20 U/L (ref 0–37)
Albumin: 4.2 g/dL (ref 3.5–5.2)
BUN: 24 mg/dL — AB (ref 6–23)
CO2: 24 mEq/L (ref 19–32)
CREATININE: 1.93 mg/dL — AB (ref 0.50–1.35)
Calcium: 8.9 mg/dL (ref 8.4–10.5)
Chloride: 113 mEq/L — ABNORMAL HIGH (ref 96–112)
GLUCOSE: 65 mg/dL — AB (ref 70–99)
Potassium: 4.7 mEq/L (ref 3.5–5.3)
Sodium: 143 mEq/L (ref 135–145)
Total Bilirubin: 1.5 mg/dL — ABNORMAL HIGH (ref 0.2–1.2)
Total Protein: 6.4 g/dL (ref 6.0–8.3)

## 2014-07-23 LAB — TSH: TSH: 0.375 u[IU]/mL (ref 0.350–4.500)

## 2014-07-23 MED ORDER — TADALAFIL 20 MG PO TABS: 10.0000 mg | ORAL_TABLET | Freq: Every day | ORAL | Status: DC | PRN

## 2014-07-23 MED ORDER — NORTRIPTYLINE HCL 25 MG PO CAPS: 25.0000 mg | ORAL_CAPSULE | Freq: Two times a day (BID) | ORAL | Status: DC | PRN

## 2014-07-23 MED ORDER — OXYCODONE-ACETAMINOPHEN 5-325 MG PO TABS: 1.0000 | ORAL_TABLET | Freq: Four times a day (QID) | ORAL | Status: DC | PRN

## 2014-07-23 NOTE — Assessment & Plan Note (Signed)
1 month, NSAID allergy, no dental coverage - gave ed handout with exercises - rec soft foods for the next few weeks - nortriptyline - gave resources for low cost dental care

## 2014-07-23 NOTE — Assessment & Plan Note (Addendum)
"  low sex drive" also difficulty with erections, previous success with viagra - cialis (patient requested switch) - check testosterone, CMP and TSH

## 2014-07-23 NOTE — Assessment & Plan Note (Signed)
Scheduled for hip replacement with Dr. Mayer Camel on 9/28, was given vicodin but these are not helping per patient, percocet has worked in the past - gave percocet until surgery - made it clear i will not continue prescribing that and he should get these meds from his orthopedist

## 2014-07-23 NOTE — Assessment & Plan Note (Signed)
Previous NSAID-induced nephropathy, also AVM incidental findinding - CMP today

## 2014-07-23 NOTE — Patient Instructions (Addendum)
For your jaw pain, I am prescribing a medication that should help make it feel better. I have also provided a handout with some exercises that should help.  We will call with your lab results in a few days.  Here are a few places you can call to ask about low cost dental care.  [facility] [phone]    [place] [phone]

## 2014-07-23 NOTE — Progress Notes (Signed)
   Subjective:    Patient ID: Paul [name], male    DOB: Jan 31, 1961, 53 y.o.   MRN: [id number]  HPI Pt presents for multiple complaints.   He reports jaw and ear pain for the past month. He reports that he first noticed pain and sound sensitivity in the left ear which then quickly seemed to spread to the left jaw. Since then he reports severe pain with opening his jaw wide for food and with chewing. He denies grinding his teeth but is not sure. He has not seen a dentist in years but can't due to cost (disability and lack of insurance.)   Review of Systems  HENT: Positive for ear pain.   Endocrine: Positive for polyuria.  Genitourinary: Positive for frequency.  Musculoskeletal: Positive for arthralgias and back pain.  Neurological: Positive for weakness and numbness.       Objective:   Physical Exam  Nursing note and vitals reviewed. Constitutional: He is oriented to person, place, and time. He appears well-developed and well-nourished. No distress.  HENT:  Head: Normocephalic and atraumatic. Not macrocephalic and not microcephalic. Head is without raccoon's eyes, without abrasion, without contusion, without laceration, without right periorbital erythema and without left periorbital erythema. Hair is normal.    Right Ear: Tympanic membrane, external ear and ear canal normal.  Left Ear: Tympanic membrane, external ear and ear canal normal.  Mouth/Throat: Oropharynx is clear and moist and mucous membranes are normal. There is trismus in the jaw. Abnormal dentition. Dental caries present.  Eyes: Conjunctivae are normal. Right eye exhibits no discharge. Left eye exhibits no discharge.  Neck: Normal range of motion. Neck supple.  Cardiovascular: Normal rate, regular rhythm and normal heart sounds.   No murmur heard. Pulmonary/Chest: Effort normal and breath sounds normal. No respiratory distress. He has no wheezes.  Abdominal: Soft. Bowel sounds are normal. He exhibits no distension.  There is no tenderness.  Lymphadenopathy:    He has no cervical adenopathy.  Neurological: He is alert and oriented to person, place, and time.  Skin: Skin is warm and dry. He is not diaphoretic.  Psychiatric: He has a normal mood and affect. His behavior is normal.   trismus       Assessment & Plan:

## 2014-07-24 LAB — TESTOSTERONE, FREE, TOTAL, SHBG
SEX HORMONE BINDING: 33 nmol/L (ref 13–71)
TESTOSTERONE-% FREE: 1.9 % (ref 1.6–2.9)
TESTOSTERONE: 222 ng/dL — AB (ref 300–890)
Testosterone, Free: 42.5 pg/mL — ABNORMAL LOW (ref 47.0–244.0)

## 2014-07-25 ENCOUNTER — Telehealth: Payer: Self-pay | Admitting: *Deleted

## 2014-07-25 NOTE — Addendum Note (Signed)
Addended by: Frazier Richards on: 07/25/2014 01:58 PM   Modules accepted: Orders

## 2014-07-25 NOTE — Telephone Encounter (Signed)
Pt informed of labs and that somebody would call him back with an appt for the specialists. Tanisa Lagace CMA

## 2014-07-25 NOTE — Telephone Encounter (Signed)
Message copied by Junious Dresser on Thu Jul 25, 2014  4:30 PM ------      Message from: Frazier Richards      Created: Thu Jul 25, 2014  1:48 PM       Please inform the patient that his testosterone level is normal for his age. His kidney function has worsened and I would like him to see a kidney specialist to discuss the implications of the kidney problem that was identified on his scans. I have entered a referral and he will be called when that appointment is set up.            Thanks. ------

## 2014-07-29 ENCOUNTER — Ambulatory Visit (HOSPITAL_COMMUNITY)
Admission: RE | Admit: 2014-07-29 | Discharge: 2014-07-29 | Disposition: A | Discharge status: Home / Self Care | Payer: Medicare HMO | Source: Ambulatory Visit | Attending: Orthopedic Surgery | Admitting: Orthopedic Surgery

## 2014-07-29 ENCOUNTER — Encounter (HOSPITAL_COMMUNITY)
Admission: RE | Admit: 2014-07-29 | Discharge: 2014-07-29 | Disposition: A | Discharge status: Home / Self Care | Payer: Medicare HMO | Source: Ambulatory Visit | Attending: Orthopedic Surgery | Admitting: Orthopedic Surgery

## 2014-07-29 ENCOUNTER — Encounter (HOSPITAL_COMMUNITY): Payer: Self-pay | Admitting: Pharmacy Technician

## 2014-07-29 ENCOUNTER — Encounter (HOSPITAL_COMMUNITY): Payer: Self-pay

## 2014-07-29 DIAGNOSIS — Z01818 Encounter for other preprocedural examination: Secondary | ICD-10-CM | POA: Insufficient documentation

## 2014-07-29 HISTORY — DX: Unspecified osteoarthritis, unspecified site: M19.90

## 2014-07-29 HISTORY — DX: Arthralgia of temporomandibular joint, unspecified side: M26.629

## 2014-07-29 HISTORY — DX: Gastro-esophageal reflux disease without esophagitis: K21.9

## 2014-07-29 LAB — CBC WITH DIFFERENTIAL/PLATELET
Basophils Absolute: 0 10*3/uL (ref 0.0–0.1)
Basophils Relative: 0 % (ref 0–1)
EOS ABS: 0.1 10*3/uL (ref 0.0–0.7)
EOS PCT: 2 % (ref 0–5)
HCT: 35.5 % — ABNORMAL LOW (ref 39.0–52.0)
HEMOGLOBIN: 11.5 g/dL — AB (ref 13.0–17.0)
Lymphocytes Relative: 34 % (ref 12–46)
Lymphs Abs: 1.7 10*3/uL (ref 0.7–4.0)
MCH: 25.2 pg — AB (ref 26.0–34.0)
MCHC: 32.4 g/dL (ref 30.0–36.0)
MCV: 77.9 fL — AB (ref 78.0–100.0)
MONOS PCT: 7 % (ref 3–12)
Monocytes Absolute: 0.3 10*3/uL (ref 0.1–1.0)
Neutro Abs: 2.8 10*3/uL (ref 1.7–7.7)
Neutrophils Relative %: 58 % (ref 43–77)
PLATELETS: 165 10*3/uL (ref 150–400)
RBC: 4.56 MIL/uL (ref 4.22–5.81)
RDW: 13.6 % (ref 11.5–15.5)
WBC: 4.9 10*3/uL (ref 4.0–10.5)

## 2014-07-29 LAB — URINALYSIS, ROUTINE W REFLEX MICROSCOPIC
Bilirubin Urine: NEGATIVE
GLUCOSE, UA: NEGATIVE mg/dL
Ketones, ur: NEGATIVE mg/dL
Leukocytes, UA: NEGATIVE
Nitrite: NEGATIVE
Protein, ur: NEGATIVE mg/dL
Specific Gravity, Urine: 1.019 (ref 1.005–1.030)
Urobilinogen, UA: 0.2 mg/dL (ref 0.0–1.0)
pH: 5.5 (ref 5.0–8.0)

## 2014-07-29 LAB — URINE MICROSCOPIC-ADD ON

## 2014-07-29 LAB — PROTIME-INR
INR: 1.13 (ref 0.00–1.49)
Prothrombin Time: 14.5 seconds (ref 11.6–15.2)

## 2014-07-29 LAB — SURGICAL PCR SCREEN
MRSA, PCR: NEGATIVE
Staphylococcus aureus: NEGATIVE

## 2014-07-29 LAB — APTT: aPTT: 32 seconds (ref 24–37)

## 2014-07-29 NOTE — Progress Notes (Signed)
Anesthesia PAT Evaluation:  Patient is a 53 year old male scheduled for left THA with removal of hardware on 08/05/14 by Dr. Turner Daniels.  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 He saw vascular surgeon Dr. Fabienne Bruns who referred patient to [facility] vascular surgeon Dr. Earnie Larsson.  According to his 2014 note, he discussed referral to nephrology "to make sure we are doing all we can do to make sure that his dialysis freedom is as long as possible if reduction of his nephron mass in the nephrectomy is necessary. I have also offered him arteriogram to more fully delineate the problem and make sure there is no renal salvage procedure that could be offered." Patient ultimately declined further evaluation as he did not want to undergo nephrectomy if it came to that.   PCP is with [facility], last with Dr. Beverely Low on 07/23/14.  She was aware of plans for surgery.  Of note following this appointment she did place a referral to [facility] due to further increase in his renal function. He saw cardiologist Dr. Peter Swaziland last in 2013 for post-operative PAF.  Meds: Norco, nortriptyline, Percocet.    Vitals: 150/80, HR 64, RR 20, O2 sat 100%, 36.4 C.  EKGs today showed SB/SR with PACs, possible LAE, LVH with repolarization abnormality (versus inferolateral T wave abnormality, consider ischemia). He has had lateral T wave inversion intermittently on EKGs from 01/2003 to 09/2012.  Inferior T wave abnormality is new when compared to 09/22/12 EKG and more prominent when compared to 02/21/08 EKG.   Echo on 09/24/12 showed:  - Left ventricle: The cavity size was normal. Wall thickness was increased in a pattern of moderate LVH. Systolic  function was vigorous. The estimated ejection fraction was in the range of 65% to 70%. There was dynamic obstruction, with mid-cavity obliteration. Wall motion was normal; there were no regional wall motion abnormalities. Features are consistent with a pseudonormal left ventricular filling pattern, with concomitant abnormal relaxation and increased filling pressure (grade 2 diastolic dysfunction). - Left atrium: The atrium was moderately dilated. - Right atrium: The atrium was mildly to moderately dilated. 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  - Tricuspid valve: Trivial regurgitation.  CXR on 07/29/14 showed: No active cardiopulmonary disease.  CT with contrast of the abd/pelvis on 09/22/12 showed: Findings highly concerning for perforated appendicitis. Abnormal appearance of both kidneys with diffuse cortical scarring and abnormal enhancement. Findings could be related to previous scarring but cannot exclude an inflammatory or infectious etiology in the kidneys. 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 Recommend vascular surgery consultation. Indeterminate 1.1 cm low density structure in the right hepatic lobe and question an adrenal nodule.   Renal ultrasound on 09/28/12 showed:  1. 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  2. Multifocal cortical scarring in the kidneys bilaterally.  3. Trace right pleural effusion.  4. Probable small cavernous hemangioma in segment VII of the liver.  Preoperative labs noted from 07/29/14 and CMET from 07/23/14 noted. H/H 11.5/35.5.  PT/PTT WNL  BUN 24,  Cr 1.93 (up from 1.57 on 02/05/13; however trends show a Cr range of since 1.3 - 1.8 since 02/2008 with most consistently ~ 1.6 - 1.7 ). TSH on 07/23/14 was  0.375.  Patient denies urinary symptoms, hematuria, abdominal pain. He denies chest pain, SOB, pedal edema, palpitations, syncope.  He reports a family history of CAD at an "older" age.  He does lawn care on his and several of his neighbors' yards.  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    I reviewed above with anesthesiologist Dr. Orene Desanctis including suspected renal AVM and pending nephrology evaluation.  Patient with elevated creatinine dating back to at least 2009 and denies any acute urologic or uremic symptoms.  He has known PAF dating back to 2009 without known recent recurrence.  EF was normal in 2013 but with moderate LVH and dynamic obstruction, with mid-cavity obliteration. He has had an abnormal EKG dating back to 2004 with intermittent more prominent T wave abnormalities.  No known CAD history. He reports recent good exercise tolerance (METS of at least 6). No new preoperative recommendations by Dr. Orene Desanctis. Further evaluation by his assigned anesthesiologist on the day of surgery for the definitive anesthesia plan.  Dr. Mayer Camel could consider nephrology consult post-operatively if any further increase in his BUN/Cr.  I did discuss risks of worsening renal function and recurrence of afib with the stress of surgery/anesthesia.  George Hugh [facility] Short Stay Center/Anesthesiology Phone [phone] 07/29/2014 5:48 PM

## 2014-07-29 NOTE — Pre-Procedure Instructions (Addendum)
Paul [name]  07/29/2014   Your procedure is scheduled on: 08/05/14  Report to [facility] short stay admitting at 530 AM.  Call this number if you have problems the morning of surgery: [phone]   Remember:   Do not eat food or drink liquids after midnight.   Take these medicines the morning of surgery with A SIP OF WATER: pain med if needed   STOP all herbel meds, nsaids (aleve,naproxen,advil,ibuprofen) 5 days prior to surgery(08/01/14) including vitamins, aspirin       Do not wear jewelry, make-up or nail polish.  Do not wear lotions, powders, or perfumes. You may wear deodorant.  Do not shave 48 hours prior to surgery. Men may shave face and neck.  Do not bring valuables to the hospital.  [facility] is not responsible                  for any belongings or valuables.               Contacts, dentures or bridgework may not be worn into surgery.  Leave suitcase in the car. After surgery it may be brought to your room.  For patients admitted to the hospital, discharge time is determined by your                treatment team.               Patients discharged the day of surgery will not be allowed to drive  home.  Name and phone number of your driver:   Special Instructions:  Special Instructions: Paul [name] - Preparing for Surgery  Before surgery, you can play an important role.  Because skin is not sterile, your skin needs to be as free of germs as possible.  You can reduce the number of germs on you skin by washing with CHG (chlorahexidine gluconate) soap before surgery.  CHG is an antiseptic cleaner which kills germs and bonds with the skin to continue killing germs even after washing.  Please DO NOT use if you have an allergy to CHG or antibacterial soaps.  If your skin becomes reddened/irritated stop using the CHG and inform your nurse when you arrive at Short Stay.  Do not shave (including legs and underarms) for at least 48 hours prior to the first CHG shower.  You may  shave your face.  Please follow these instructions carefully:   1.  Shower with CHG Soap the night before surgery and the morning of Surgery.  2.  If you choose to wash your hair, wash your hair first as usual with your normal shampoo.  3.  After you shampoo, rinse your hair and body thoroughly to remove the Shampoo.  4.  Use CHG as you would any other liquid soap.  You can apply chg directly  to the skin and wash gently with scrungie or a clean washcloth.  5.  Apply the CHG Soap to your body ONLY FROM THE NECK DOWN.  Do not use on open wounds or open sores.  Avoid contact with your eyes ears, mouth and genitals (private parts).  Wash genitals (private parts)       with your normal soap.  6.  Wash thoroughly, paying special attention to the area where your surgery will be performed.  7.  Thoroughly rinse your body with warm water from the neck down.  8.  DO NOT shower/wash with your normal soap after using and rinsing off the CHG Soap.  9.  Pat yourself dry with a clean towel.            10.  Wear clean pajamas.            11.  Place clean sheets on your bed the night of your first shower and do not sleep with pets.  Day of Surgery  Do not apply any lotions/deodorants the morning of surgery.  Please wear clean clothes to the hospital/surgery center.   Please read over the following fact sheets that you were given: Pain Booklet, Coughing and Deep Breathing, Blood Transfusion Information, Total Joint Packet, MRSA Information and Surgical Site Infection Prevention

## 2014-07-31 NOTE — H&P (Signed)
TOTAL HIP ADMISSION H&P  Patient is admitted for left total hip arthroplasty.  Subjective:  Chief Complaint: left hip pain  HPI: Paul [name], 53 y.o. male, has a history of pain and functional disability in the left hip(s) due to trauma and arthritis and patient has failed non-surgical conservative treatments for greater than 12 weeks to include NSAID's and/or analgesics, flexibility and strengthening excercises, supervised PT with diminished ADL's post treatment, weight reduction as appropriate and activity modification.  Onset of symptoms was gradual starting >10 years ago with gradually worsening course since that time.The patient noted prior procedures of the hip to include dynamic pediatric hip screw on the left hip(s).  Patient currently rates pain in the left hip at 10 out of 10 with activity. Patient has worsening of pain with activity and weight bearing, pain that interfers with activities of daily living, pain with passive range of motion and crepitus. Patient has evidence of periarticular osteophytes and joint space narrowing by imaging studies. This condition presents safety issues increasing the risk of falls.  There is no current active infection.  Patient Active Problem List   Diagnosis Date Noted  . 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 Preventative health care 02/06/2013  . Sinus tachycardia 02/06/2013  . Right hand pain 12/27/2012  . Hypertensive cardiomyopathy 09/24/2012  . Bilateral hip pain 09/05/2012  . Other, mixed, or unspecified nondependent drug abuse, unspecified 12/03/2008  . Hypertrophic obstructive cardiomyopathy 08/07/2008  . ERECTILE DYSFUNCTION 04/23/2008  . GERD 02/21/2008  . ATRIAL FIBRILLATION WITH RAPID VENTRICULAR RESPONSE 02/17/2008  . BACK PAIN 10/11/2007  . Pain in Joint, Pelvic Region and Thigh 07/07/2007  . HYPERLIPIDEMIA 05/05/2007  . TOBACCO USE 03/31/2007  . RHINITIS, ALLERGIC NOS 03/31/2007  . HYPERTENSION,  BENIGN SYSTEMIC 01/05/2007  . RENAL INSUFFICIENCY, CHRONIC 01/05/2007   Past Medical History  Diagnosis Date  . Hypertensive cardiomyopathy   . Chronic kidney disease   . Atrial fibrillation     Isolated episode  . Hypertension     denies  . Dysrhythmia     atrial fib 11/13  . GERD (gastroesophageal reflux disease)     occ  . Arthritis   . TMJ arthralgia     Past Surgical History  Procedure Laterality Date  . Hip surgery Left 72    lft   . Total hip arthroplasty Right 03,01    x2  . Appendectomy    . Laparoscopy  09/22/2012    Procedure: LAPAROSCOPY DIAGNOSTIC;  Surgeon: Harl Bowie, MD;  Location: [facility] ORS;  Service: General;  Laterality: N/A;  . Laparoscopic appendectomy  09/22/2012    Procedure: APPENDECTOMY LAPAROSCOPIC;  Surgeon: Harl Bowie, MD;  Location: [facility] ORS;  Service: General;;    No prescriptions prior to admission   Allergies  Allergen Reactions  . Ibuprofen Other (See Comments)    Kidney damage  . Nsaids     REACTION: nsaid induced nephropathy    History  Substance Use Topics  . Smoking status: Light Tobacco Smoker -- 0.25 packs/day for 10 years    Types: Cigarettes  . Smokeless tobacco: Never Used  . Alcohol Use: 1.2 oz/week    2 Cans of beer per week     Comment: occasional    No family history on file.   Review of Systems  Constitutional: Negative.   HENT: Negative.   Eyes: Negative.   Respiratory: Negative.   Cardiovascular: Negative.        HTN  Gastrointestinal: Negative.   Genitourinary:       Kidney disease  Musculoskeletal: Positive for joint pain.  Skin: Negative.   Neurological: Negative.   Endo/Heme/Allergies: Negative.   Psychiatric/Behavioral: Negative.     Objective:  Physical Exam  Constitutional: He is oriented to person, place, and time. He appears well-developed and well-nourished.  HENT:  Head: Normocephalic and atraumatic.  Eyes: Pupils are equal, round, and reactive to light.  Neck: Normal range  of motion. Neck supple.  Cardiovascular: Intact distal pulses.   Respiratory: Effort normal.  Musculoskeletal: He exhibits tenderness.  Both hips have essentially no internal or external rotation and are stuck external rotate about 20.  The right hip has a 30 flexion contracture the left hip a 10 flexion contracture.  Foot tap is are negative.  His skin is intact he is neurovascularly intact distally.  Neurological: He is alert and oriented to person, place, and time.  Skin: Skin is warm and dry.  Psychiatric: He has a normal mood and affect. His behavior is normal. Judgment and thought content normal.    Vital signs in last 24 hours:    Labs:   Estimated body mass index is 25.00 kg/(m^2) as calculated from the following:   Height as of 02/05/13: $RemoveBef'5\' 11"'XQuHzNbmsI$  (1.803 m).   Weight as of 02/05/13: 81.285 kg (179 lb 3.2 oz).   Imaging Review Plain radiographs demonstrate AP pelvis and frog lateral the left hip show the dynamic hip screw, which is a pediatric size in place no broken screws.  The plate itself is overgrown with bone.  On the right side there is an S-ROM prosthesis with Booker grade 3-4 heterotopic bone encasing the neck and the module.  There is a lucent line completely around the module stem, consistent with fibrous ingrowth.  Assessment/Plan:  End stage arthritis, left hip(s)  The patient history, physical examination, clinical judgement of the provider and imaging studies are consistent with end stage degenerative joint disease of the left hip(s) and total hip arthroplasty is deemed medically necessary. The treatment options including medical management, injection therapy, arthroscopy and arthroplasty were discussed at length. The risks and benefits of total hip arthroplasty were presented and reviewed. The risks due to aseptic loosening, infection, stiffness, dislocation/subluxation,  thromboembolic complications and other imponderables were discussed.  The patient acknowledged  the explanation, agreed to proceed with the plan and consent was signed. Patient is being admitted for inpatient treatment for surgery, pain control, PT, OT, prophylactic antibiotics, VTE prophylaxis, progressive ambulation and ADL's and discharge planning.The patient is planning to be discharged home with home health services

## 2014-08-04 MED ORDER — DEXTROSE-NACL 5-0.45 % IV SOLN: INTRAVENOUS | Status: DC

## 2014-08-04 MED ORDER — CEFAZOLIN SODIUM-DEXTROSE 2-3 GM-% IV SOLR
2.0000 g | INTRAVENOUS | Status: AC
  Administered 2014-08-05: 2 g via INTRAVENOUS
  Filled 2014-08-04: qty 50

## 2014-08-04 MED ORDER — CHLORHEXIDINE GLUCONATE 4 % EX LIQD
60.0000 mL | Freq: Once | CUTANEOUS | Status: DC
  Filled 2014-08-04: qty 60

## 2014-08-05 ENCOUNTER — Inpatient Hospital Stay (HOSPITAL_COMMUNITY)
Admission: RE | Admit: 2014-08-05 | Discharge: 2014-08-07 | DRG: 470 | Disposition: A | Discharge status: Skilled Nursing Facility | Payer: Medicare HMO | Source: Ambulatory Visit | Attending: Orthopedic Surgery | Admitting: Orthopedic Surgery

## 2014-08-05 ENCOUNTER — Encounter (HOSPITAL_COMMUNITY): Payer: Medicare HMO | Admitting: Vascular Surgery

## 2014-08-05 ENCOUNTER — Inpatient Hospital Stay (HOSPITAL_COMMUNITY): Payer: Medicare HMO | Admitting: Anesthesiology

## 2014-08-05 ENCOUNTER — Encounter (HOSPITAL_COMMUNITY)
Admission: RE | Disposition: A | Discharge status: Skilled Nursing Facility | Payer: Self-pay | Source: Ambulatory Visit | Attending: Orthopedic Surgery

## 2014-08-05 ENCOUNTER — Encounter (HOSPITAL_COMMUNITY): Payer: Self-pay | Admitting: *Deleted

## 2014-08-05 ENCOUNTER — Inpatient Hospital Stay (HOSPITAL_COMMUNITY): Payer: Medicare HMO

## 2014-08-05 DIAGNOSIS — M93003 Unspecified slipped upper femoral epiphysis (nontraumatic), unspecified hip: Secondary | ICD-10-CM | POA: Diagnosis present

## 2014-08-05 DIAGNOSIS — F17200 Nicotine dependence, unspecified, uncomplicated: Secondary | ICD-10-CM | POA: Diagnosis present

## 2014-08-05 DIAGNOSIS — I43 Cardiomyopathy in diseases classified elsewhere: Secondary | ICD-10-CM | POA: Diagnosis present

## 2014-08-05 DIAGNOSIS — M1610 Unilateral primary osteoarthritis, unspecified hip: Principal | ICD-10-CM | POA: Diagnosis present

## 2014-08-05 DIAGNOSIS — M1612 Unilateral primary osteoarthritis, left hip: Secondary | ICD-10-CM | POA: Diagnosis present

## 2014-08-05 DIAGNOSIS — Z96649 Presence of unspecified artificial hip joint: Secondary | ICD-10-CM

## 2014-08-05 DIAGNOSIS — K219 Gastro-esophageal reflux disease without esophagitis: Secondary | ICD-10-CM | POA: Diagnosis present

## 2014-08-05 DIAGNOSIS — N189 Chronic kidney disease, unspecified: Secondary | ICD-10-CM | POA: Diagnosis present

## 2014-08-05 DIAGNOSIS — I1310 Hypertensive heart and chronic kidney disease without heart failure, with stage 1 through stage 4 chronic kidney disease, or unspecified chronic kidney disease: Secondary | ICD-10-CM | POA: Diagnosis present

## 2014-08-05 DIAGNOSIS — M25559 Pain in unspecified hip: Secondary | ICD-10-CM | POA: Diagnosis present

## 2014-08-05 DIAGNOSIS — M169 Osteoarthritis of hip, unspecified: Principal | ICD-10-CM | POA: Diagnosis present

## 2014-08-05 DIAGNOSIS — D62 Acute posthemorrhagic anemia: Secondary | ICD-10-CM | POA: Diagnosis not present

## 2014-08-05 HISTORY — PX: TOTAL HIP ARTHROPLASTY WITH HARDWARE REMOVAL: SHX6438

## 2014-08-05 LAB — TYPE AND SCREEN
ABO/RH(D): O POS
Antibody Screen: POSITIVE

## 2014-08-05 LAB — BASIC METABOLIC PANEL
Anion gap: 14 (ref 5–15)
BUN: 27 mg/dL — ABNORMAL HIGH (ref 6–23)
CALCIUM: 8.9 mg/dL (ref 8.4–10.5)
CHLORIDE: 106 meq/L (ref 96–112)
CO2: 21 meq/L (ref 19–32)
Creatinine, Ser: 1.89 mg/dL — ABNORMAL HIGH (ref 0.50–1.35)
GFR calc Af Amer: 44 mL/min — ABNORMAL LOW (ref >90)
GFR calc non Af Amer: 38 mL/min — ABNORMAL LOW (ref >90)
GLUCOSE: 89 mg/dL (ref 70–99)
Potassium: 4.3 mEq/L (ref 3.7–5.3)
SODIUM: 141 meq/L (ref 137–147)

## 2014-08-05 LAB — CBC
HCT: 31.6 % — ABNORMAL LOW (ref 39.0–52.0)
Hemoglobin: 10.2 g/dL — ABNORMAL LOW (ref 13.0–17.0)
MCH: 24.7 pg — ABNORMAL LOW (ref 26.0–34.0)
MCHC: 32.3 g/dL (ref 30.0–36.0)
MCV: 76.5 fL — AB (ref 78.0–100.0)
PLATELETS: 167 10*3/uL (ref 150–400)
RBC: 4.13 MIL/uL — ABNORMAL LOW (ref 4.22–5.81)
RDW: 13.1 % (ref 11.5–15.5)
WBC: 10.3 10*3/uL (ref 4.0–10.5)

## 2014-08-05 LAB — CREATININE, SERUM
Creatinine, Ser: 1.83 mg/dL — ABNORMAL HIGH (ref 0.50–1.35)
GFR calc Af Amer: 46 mL/min — ABNORMAL LOW (ref >90)
GFR calc non Af Amer: 40 mL/min — ABNORMAL LOW (ref >90)

## 2014-08-05 SURGERY — REVISION, ARTHROPLASTY, HIP
Anesthesia: General | Site: Hip | Laterality: Left

## 2014-08-05 MED ORDER — METHOCARBAMOL 500 MG PO TABS: 500.0000 mg | ORAL_TABLET | Freq: Two times a day (BID) | ORAL | Status: DC

## 2014-08-05 MED ORDER — HYDROMORPHONE HCL 1 MG/ML IJ SOLN
INTRAMUSCULAR | Status: AC
  Filled 2014-08-05: qty 1

## 2014-08-05 MED ORDER — GLYCOPYRROLATE 0.2 MG/ML IJ SOLN
INTRAMUSCULAR | Status: AC
  Filled 2014-08-05: qty 3

## 2014-08-05 MED ORDER — OXYCODONE-ACETAMINOPHEN 5-325 MG PO TABS: 1.0000 | ORAL_TABLET | ORAL | Status: DC | PRN

## 2014-08-05 MED ORDER — SODIUM CHLORIDE 0.9 % IJ SOLN
INTRAMUSCULAR | Status: AC
  Filled 2014-08-05: qty 10

## 2014-08-05 MED ORDER — SODIUM CHLORIDE 0.9 % IR SOLN
Status: DC | PRN
  Administered 2014-08-05: 1000 mL

## 2014-08-05 MED ORDER — DEXAMETHASONE SODIUM PHOSPHATE 4 MG/ML IJ SOLN
INTRAMUSCULAR | Status: DC | PRN
  Administered 2014-08-05: 4 mg via INTRAVENOUS

## 2014-08-05 MED ORDER — VECURONIUM BROMIDE 10 MG IV SOLR
INTRAVENOUS | Status: DC | PRN
  Administered 2014-08-05 (×2): 2 mg via INTRAVENOUS
  Administered 2014-08-05 (×2): 1 mg via INTRAVENOUS

## 2014-08-05 MED ORDER — LIDOCAINE HCL (CARDIAC) 20 MG/ML IV SOLN
INTRAVENOUS | Status: AC
  Filled 2014-08-05: qty 5

## 2014-08-05 MED ORDER — LACTATED RINGERS IV SOLN
INTRAVENOUS | Status: DC | PRN
  Administered 2014-08-05: 07:00:00 via INTRAVENOUS

## 2014-08-05 MED ORDER — ALBUMIN HUMAN 5 % IV SOLN
INTRAVENOUS | Status: DC | PRN
  Administered 2014-08-05: 10:00:00 via INTRAVENOUS

## 2014-08-05 MED ORDER — DOCUSATE SODIUM 100 MG PO CAPS
100.0000 mg | ORAL_CAPSULE | Freq: Two times a day (BID) | ORAL | Status: DC
  Administered 2014-08-05 – 2014-08-07 (×4): 100 mg via ORAL
  Filled 2014-08-05 (×6): qty 1

## 2014-08-05 MED ORDER — METHOCARBAMOL 500 MG PO TABS
500.0000 mg | ORAL_TABLET | Freq: Four times a day (QID) | ORAL | Status: DC | PRN
  Administered 2014-08-05 – 2014-08-07 (×6): 500 mg via ORAL
  Filled 2014-08-05 (×7): qty 1

## 2014-08-05 MED ORDER — DEXAMETHASONE SODIUM PHOSPHATE 4 MG/ML IJ SOLN
INTRAMUSCULAR | Status: AC
  Filled 2014-08-05: qty 1

## 2014-08-05 MED ORDER — CEFUROXIME SODIUM 1.5 G IJ SOLR
INTRAMUSCULAR | Status: AC
  Filled 2014-08-05: qty 1.5

## 2014-08-05 MED ORDER — PROMETHAZINE HCL 25 MG/ML IJ SOLN: 6.2500 mg | INTRAMUSCULAR | Status: DC | PRN

## 2014-08-05 MED ORDER — NORTRIPTYLINE HCL 25 MG PO CAPS
25.0000 mg | ORAL_CAPSULE | Freq: Two times a day (BID) | ORAL | Status: DC | PRN
  Filled 2014-08-05: qty 1

## 2014-08-05 MED ORDER — STERILE WATER FOR INJECTION IJ SOLN
INTRAMUSCULAR | Status: AC
  Filled 2014-08-05: qty 10

## 2014-08-05 MED ORDER — METOCLOPRAMIDE HCL 10 MG PO TABS: 5.0000 mg | ORAL_TABLET | Freq: Three times a day (TID) | ORAL | Status: DC | PRN

## 2014-08-05 MED ORDER — SENNOSIDES-DOCUSATE SODIUM 8.6-50 MG PO TABS: 1.0000 | ORAL_TABLET | Freq: Every evening | ORAL | Status: DC | PRN

## 2014-08-05 MED ORDER — MENTHOL 3 MG MT LOZG: 1.0000 | LOZENGE | OROMUCOSAL | Status: DC | PRN

## 2014-08-05 MED ORDER — VECURONIUM BROMIDE 10 MG IV SOLR
INTRAVENOUS | Status: AC
  Filled 2014-08-05: qty 10

## 2014-08-05 MED ORDER — HYDROMORPHONE HCL 1 MG/ML IJ SOLN
0.2500 mg | INTRAMUSCULAR | Status: DC | PRN
  Administered 2014-08-05 (×4): 0.5 mg via INTRAVENOUS

## 2014-08-05 MED ORDER — BISACODYL 5 MG PO TBEC
5.0000 mg | DELAYED_RELEASE_TABLET | Freq: Every day | ORAL | Status: DC | PRN
  Administered 2014-08-07: 5 mg via ORAL
  Filled 2014-08-05: qty 1

## 2014-08-05 MED ORDER — ENOXAPARIN SODIUM 40 MG/0.4ML ~~LOC~~ SOLN
40.0000 mg | SUBCUTANEOUS | Status: DC
  Administered 2014-08-06 – 2014-08-07 (×2): 40 mg via SUBCUTANEOUS
  Filled 2014-08-05 (×3): qty 0.4

## 2014-08-05 MED ORDER — BUPIVACAINE-EPINEPHRINE (PF) 0.5% -1:200000 IJ SOLN
INTRAMUSCULAR | Status: AC
  Filled 2014-08-05: qty 30

## 2014-08-05 MED ORDER — HYDROMORPHONE HCL 1 MG/ML IJ SOLN
INTRAMUSCULAR | Status: AC
Start: 2014-08-05 — End: 2014-08-06
  Filled 2014-08-05: qty 1

## 2014-08-05 MED ORDER — ROCURONIUM BROMIDE 50 MG/5ML IV SOLN
INTRAVENOUS | Status: AC
  Filled 2014-08-05: qty 1

## 2014-08-05 MED ORDER — PHENYLEPHRINE HCL 10 MG/ML IJ SOLN
10.0000 mg | INTRAMUSCULAR | Status: DC | PRN
  Administered 2014-08-05: 25 ug/min via INTRAVENOUS

## 2014-08-05 MED ORDER — OXYCODONE HCL 5 MG PO TABS
5.0000 mg | ORAL_TABLET | Freq: Once | ORAL | Status: AC | PRN
  Administered 2014-08-05: 5 mg via ORAL

## 2014-08-05 MED ORDER — SUCCINYLCHOLINE CHLORIDE 20 MG/ML IJ SOLN
INTRAMUSCULAR | Status: AC
Start: 2014-08-05 — End: 2014-08-05
  Filled 2014-08-05: qty 1

## 2014-08-05 MED ORDER — EPHEDRINE SULFATE 50 MG/ML IJ SOLN
INTRAMUSCULAR | Status: DC | PRN
  Administered 2014-08-05 (×3): 10 mg via INTRAVENOUS

## 2014-08-05 MED ORDER — PHENOL 1.4 % MT LIQD: 1.0000 | OROMUCOSAL | Status: DC | PRN

## 2014-08-05 MED ORDER — OXYCODONE HCL 5 MG PO TABS
5.0000 mg | ORAL_TABLET | ORAL | Status: DC | PRN
  Administered 2014-08-05 – 2014-08-07 (×11): 10 mg via ORAL
  Filled 2014-08-05 (×11): qty 2

## 2014-08-05 MED ORDER — OXYCODONE HCL 5 MG PO TABS
ORAL_TABLET | ORAL | Status: AC
  Filled 2014-08-05: qty 1

## 2014-08-05 MED ORDER — GLYCOPYRROLATE 0.2 MG/ML IJ SOLN
INTRAMUSCULAR | Status: DC | PRN
  Administered 2014-08-05: 0.2 mg via INTRAVENOUS
  Administered 2014-08-05: .5 mg via INTRAVENOUS

## 2014-08-05 MED ORDER — FENTANYL CITRATE 0.05 MG/ML IJ SOLN
INTRAMUSCULAR | Status: AC
  Filled 2014-08-05: qty 5

## 2014-08-05 MED ORDER — ENOXAPARIN SODIUM 40 MG/0.4ML ~~LOC~~ SOLN: 40.0000 mg | SUBCUTANEOUS | Status: DC

## 2014-08-05 MED ORDER — MIDAZOLAM HCL 5 MG/5ML IJ SOLN
INTRAMUSCULAR | Status: DC | PRN
  Administered 2014-08-05: 2 mg via INTRAVENOUS

## 2014-08-05 MED ORDER — METHOCARBAMOL 1000 MG/10ML IJ SOLN
500.0000 mg | Freq: Four times a day (QID) | INTRAVENOUS | Status: DC | PRN
  Administered 2014-08-05: 500 mg via INTRAVENOUS
  Filled 2014-08-05: qty 5

## 2014-08-05 MED ORDER — PHENYLEPHRINE HCL 10 MG/ML IJ SOLN: INTRAMUSCULAR | Status: DC | PRN

## 2014-08-05 MED ORDER — KCL IN DEXTROSE-NACL 20-5-0.45 MEQ/L-%-% IV SOLN
INTRAVENOUS | Status: DC
  Administered 2014-08-05: 18:00:00 via INTRAVENOUS
  Filled 2014-08-05 (×8): qty 1000

## 2014-08-05 MED ORDER — GLYCOPYRROLATE 0.2 MG/ML IJ SOLN
INTRAMUSCULAR | Status: AC
  Filled 2014-08-05: qty 1

## 2014-08-05 MED ORDER — OXYCODONE HCL 5 MG/5ML PO SOLN: 5.0000 mg | Freq: Once | ORAL | Status: AC | PRN

## 2014-08-05 MED ORDER — PHENYLEPHRINE 40 MCG/ML (10ML) SYRINGE FOR IV PUSH (FOR BLOOD PRESSURE SUPPORT)
PREFILLED_SYRINGE | INTRAVENOUS | Status: AC
  Filled 2014-08-05: qty 10

## 2014-08-05 MED ORDER — ONDANSETRON HCL 4 MG/2ML IJ SOLN: 4.0000 mg | Freq: Four times a day (QID) | INTRAMUSCULAR | Status: DC | PRN

## 2014-08-05 MED ORDER — SODIUM CHLORIDE 0.9 % IV SOLN
INTRAVENOUS | Status: DC | PRN
  Administered 2014-08-05 (×2): via INTRAVENOUS

## 2014-08-05 MED ORDER — PROPOFOL 10 MG/ML IV BOLUS
INTRAVENOUS | Status: DC | PRN
  Administered 2014-08-05: 200 mg via INTRAVENOUS

## 2014-08-05 MED ORDER — ONDANSETRON HCL 4 MG/2ML IJ SOLN
INTRAMUSCULAR | Status: DC | PRN
  Administered 2014-08-05: 4 mg via INTRAVENOUS

## 2014-08-05 MED ORDER — PROPOFOL 10 MG/ML IV BOLUS
INTRAVENOUS | Status: AC
  Filled 2014-08-05: qty 20

## 2014-08-05 MED ORDER — NEOSTIGMINE METHYLSULFATE 10 MG/10ML IV SOLN
INTRAVENOUS | Status: AC
  Filled 2014-08-05: qty 1

## 2014-08-05 MED ORDER — ROCURONIUM BROMIDE 100 MG/10ML IV SOLN
INTRAVENOUS | Status: DC | PRN
  Administered 2014-08-05: 50 mg via INTRAVENOUS

## 2014-08-05 MED ORDER — PHENYLEPHRINE HCL 10 MG/ML IJ SOLN
INTRAMUSCULAR | Status: DC | PRN
  Administered 2014-08-05 (×2): 80 ug via INTRAVENOUS

## 2014-08-05 MED ORDER — ACETAMINOPHEN 325 MG PO TABS: 650.0000 mg | ORAL_TABLET | Freq: Four times a day (QID) | ORAL | Status: DC | PRN

## 2014-08-05 MED ORDER — ONDANSETRON HCL 4 MG PO TABS: 4.0000 mg | ORAL_TABLET | Freq: Four times a day (QID) | ORAL | Status: DC | PRN

## 2014-08-05 MED ORDER — MAGNESIUM CITRATE PO SOLN: 1.0000 | Freq: Once | ORAL | Status: AC | PRN

## 2014-08-05 MED ORDER — EPHEDRINE SULFATE 50 MG/ML IJ SOLN
INTRAMUSCULAR | Status: AC
  Filled 2014-08-05: qty 1

## 2014-08-05 MED ORDER — FENTANYL CITRATE 0.05 MG/ML IJ SOLN
INTRAMUSCULAR | Status: DC | PRN
  Administered 2014-08-05: 50 ug via INTRAVENOUS
  Administered 2014-08-05: 100 ug via INTRAVENOUS
  Administered 2014-08-05 (×5): 50 ug via INTRAVENOUS

## 2014-08-05 MED ORDER — MIDAZOLAM HCL 2 MG/2ML IJ SOLN
INTRAMUSCULAR | Status: AC
  Filled 2014-08-05: qty 2

## 2014-08-05 MED ORDER — BUPIVACAINE-EPINEPHRINE 0.5% -1:200000 IJ SOLN
INTRAMUSCULAR | Status: DC | PRN
  Administered 2014-08-05: 20 mL

## 2014-08-05 MED ORDER — METOCLOPRAMIDE HCL 5 MG/ML IJ SOLN: 5.0000 mg | Freq: Three times a day (TID) | INTRAMUSCULAR | Status: DC | PRN

## 2014-08-05 MED ORDER — HYDROMORPHONE HCL 1 MG/ML IJ SOLN
1.0000 mg | INTRAMUSCULAR | Status: DC | PRN
  Administered 2014-08-05 – 2014-08-07 (×12): 1 mg via INTRAVENOUS
  Filled 2014-08-05 (×12): qty 1

## 2014-08-05 MED ORDER — ONDANSETRON HCL 4 MG/2ML IJ SOLN
INTRAMUSCULAR | Status: AC
  Filled 2014-08-05: qty 2

## 2014-08-05 MED ORDER — LIDOCAINE HCL (CARDIAC) 20 MG/ML IV SOLN
INTRAVENOUS | Status: DC | PRN
  Administered 2014-08-05: 80 mg via INTRAVENOUS

## 2014-08-05 MED ORDER — NEOSTIGMINE METHYLSULFATE 10 MG/10ML IV SOLN
INTRAVENOUS | Status: DC | PRN
  Administered 2014-08-05: 4 mg via INTRAVENOUS

## 2014-08-05 MED ORDER — ACETAMINOPHEN 650 MG RE SUPP: 650.0000 mg | Freq: Four times a day (QID) | RECTAL | Status: DC | PRN

## 2014-08-05 MED ORDER — DIPHENHYDRAMINE HCL 12.5 MG/5ML PO ELIX: 12.5000 mg | ORAL_SOLUTION | ORAL | Status: DC | PRN

## 2014-08-05 MED ORDER — TRANEXAMIC ACID 100 MG/ML IV SOLN
1000.0000 mg | INTRAVENOUS | Status: AC
  Administered 2014-08-05: 1000 mg via INTRAVENOUS
  Filled 2014-08-05: qty 10

## 2014-08-05 SURGICAL SUPPLY — 55 items
BLADE SAW SGTL 18X1.27X75 (BLADE) ×2 IMPLANT
BLADE SAW SGTL 18X1.27X75MM (BLADE) ×1
BRUSH FEMORAL CANAL (MISCELLANEOUS) IMPLANT
CAPT HIP PF COP ×3 IMPLANT
COVER BACK TABLE 24X17X13 BIG (DRAPES) IMPLANT
COVER SURGICAL LIGHT HANDLE (MISCELLANEOUS) ×6 IMPLANT
DRAPE ORTHO SPLIT 77X108 STRL (DRAPES) ×2
DRAPE PROXIMA HALF (DRAPES) ×3 IMPLANT
DRAPE SURG ORHT 6 SPLT 77X108 (DRAPES) ×1 IMPLANT
DRAPE U-SHAPE 47X51 STRL (DRAPES) ×3 IMPLANT
DRILL BIT 7/64X5 (BIT) ×3 IMPLANT
DRSG AQUACEL AG ADV 3.5X10 (GAUZE/BANDAGES/DRESSINGS) IMPLANT
DRSG AQUACEL AG ADV 3.5X14 (GAUZE/BANDAGES/DRESSINGS) ×3 IMPLANT
DURAPREP 26ML APPLICATOR (WOUND CARE) ×3 IMPLANT
ELECT BLADE 4.0 EZ CLEAN MEGAD (MISCELLANEOUS)
ELECT REM PT RETURN 9FT ADLT (ELECTROSURGICAL) ×3
ELECTRODE BLDE 4.0 EZ CLN MEGD (MISCELLANEOUS) IMPLANT
ELECTRODE REM PT RTRN 9FT ADLT (ELECTROSURGICAL) ×1 IMPLANT
GAUZE XEROFORM 1X8 LF (GAUZE/BANDAGES/DRESSINGS) ×3 IMPLANT
GLOVE BIO SURGEON STRL SZ7.5 (GLOVE) ×3 IMPLANT
GLOVE BIO SURGEON STRL SZ8.5 (GLOVE) ×6 IMPLANT
GLOVE BIOGEL PI IND STRL 8 (GLOVE) ×2 IMPLANT
GLOVE BIOGEL PI IND STRL 9 (GLOVE) ×1 IMPLANT
GLOVE BIOGEL PI INDICATOR 8 (GLOVE) ×4
GLOVE BIOGEL PI INDICATOR 9 (GLOVE) ×2
GOWN STRL REUS W/ TWL LRG LVL3 (GOWN DISPOSABLE) ×2 IMPLANT
GOWN STRL REUS W/ TWL XL LVL3 (GOWN DISPOSABLE) ×3 IMPLANT
GOWN STRL REUS W/TWL LRG LVL3 (GOWN DISPOSABLE) ×4
GOWN STRL REUS W/TWL XL LVL3 (GOWN DISPOSABLE) ×6
HANDPIECE INTERPULSE COAX TIP (DISPOSABLE)
HOOD PEEL AWAY FACE SHEILD DIS (HOOD) ×6 IMPLANT
KIT BASIN OR (CUSTOM PROCEDURE TRAY) ×3 IMPLANT
KIT ROOM TURNOVER OR (KITS) ×3 IMPLANT
MANIFOLD NEPTUNE II (INSTRUMENTS) ×3 IMPLANT
NEEDLE 22X1 1/2 (OR ONLY) (NEEDLE) ×3 IMPLANT
NS IRRIG 1000ML POUR BTL (IV SOLUTION) ×3 IMPLANT
PACK TOTAL JOINT (CUSTOM PROCEDURE TRAY) ×3 IMPLANT
PAD ARMBOARD 7.5X6 YLW CONV (MISCELLANEOUS) ×6 IMPLANT
PASSER SUT SWANSON 36MM LOOP (INSTRUMENTS) ×3 IMPLANT
PRESSURIZER FEMORAL UNIV (MISCELLANEOUS) IMPLANT
RASP HELIOCORDIAL MED (MISCELLANEOUS) ×3 IMPLANT
SET HNDPC FAN SPRY TIP SCT (DISPOSABLE) IMPLANT
SUT ETHIBOND 2 V 37 (SUTURE) ×3 IMPLANT
SUT VIC AB 0 CTB1 27 (SUTURE) ×12 IMPLANT
SUT VIC AB 1 CTX 36 (SUTURE) ×4
SUT VIC AB 1 CTX36XBRD ANBCTR (SUTURE) ×2 IMPLANT
SUT VIC AB 2-0 CTB1 (SUTURE) ×3 IMPLANT
SUT VIC AB 3-0 SH 27 (SUTURE) ×2
SUT VIC AB 3-0 SH 27X BRD (SUTURE) ×1 IMPLANT
SYR CONTROL 10ML LL (SYRINGE) ×3 IMPLANT
TOWEL OR 17X24 6PK STRL BLUE (TOWEL DISPOSABLE) ×3 IMPLANT
TOWEL OR 17X26 10 PK STRL BLUE (TOWEL DISPOSABLE) ×3 IMPLANT
TOWER CARTRIDGE SMART MIX (DISPOSABLE) IMPLANT
TRAY FOLEY CATH 14FR (SET/KITS/TRAYS/PACK) IMPLANT
WATER STERILE IRR 1000ML POUR (IV SOLUTION) ×12 IMPLANT

## 2014-08-05 NOTE — Op Note (Signed)
OPERATIVE REPORT    DATE OF PROCEDURE:  08/05/2014       PREOPERATIVE DIAGNOSIS:  LEFT HIP SLIPPED CAPITAL  EPIPHYSIS/DEGENERATIVE JOINT DISEASE                                                          POSTOPERATIVE DIAGNOSIS:  EFT HIP SLIPPED CAPITAL  EPIPHYSIS/DEGENERATIVE JOINT DISEASE                                                           PROCEDURE:  Removal of old Zimmer left dynamic hip screw that was overgrown with bone, followed by L total hip arthroplasty using a 56 mm DePuy Pinnacle  Cup, [employer], 10-degree polyethylene liner index superior  and posterior, a +0 36 mm ceramic head, a [id number] SROM stem, 22Bsm Sleeve   SURGEON: Jaray Boliver [name]    ASSISTANT:   Eric K. Barton Dubois  (present throughout entire procedure and necessary for timely completion of the procedure)   ANESTHESIA: General BLOOD LOSS: 700 FLUID REPLACEMENT: 2000 crystalloid    INDICATIONS FOR PROCEDURE: A 53 y.o. year-old With  [place]  EPIPHYSIS/DEGENERATIVE JOINT DISEASE   for 20+ years, x-rays show bone-on-bone arthritic changes, lateral subluxation of femoral head area large osteophytes with coxa magna., retained Zimmer dynamic hip screw with 4 distal bicortical screws and a standard proximal lag screw. Despite conservative measures with observation, anti-inflammatory medicine, narcotics, use of a cane, has severe unremitting pain and can ambulate only a few blocks before resting. Patient desires elective, removal of the dynamic hip screw, followed by L total hip arthroplasty to decrease pain and increase function. The risks, benefits, and alternatives were discussed at length including but not limited to the risks of infection, bleeding, nerve injury, stiffness, blood clots, the need for revision surgery, cardiopulmonary complications, among others, and they were willing to proceed. Questions answered     PROCEDURE IN DETAIL: The patient was identified by armband,   received preoperative IV antibiotics in the holding area at [facility], taken to the operating room , appropriate anesthetic monitors  were attached and general endotracheal anesthesia induced. Foley catheter was inserted. Pt was rolled into the R lateral decubitus position and fixed there with a Stulberg Mark II pelvic clamp.  The L lower extremity was then prepped and draped  in the usual sterile fashion from the ankle to the hemipelvis. A time-out  procedure was performed. The skin along the lateral hip and thigh  infiltrated with 10 mL of 0.5% Marcaine and epinephrine solution. We  then made a posterolateral approach to the hip. With a #10 blade, a 25 cm  incision was made through the skin and subcutaneous tissue down to the level of the  IT band. Small bleeders were identified and cauterized. The IT band was cut in  line with skin incision exposing the greater trochanter. We then dissected distally for approximately 12 cm, and split the vastus lateralis exposing the proximal lag screw. The plate itself was encased in bone and using a quarter inch osteotome and a half inch osteotome we carefully remove bone  from the lateral aspect of the plate and then removed the for cross point would wrist screws without difficulty. After carefully dissecting around the plate with the osteotome and removing the lag screw the plate itself was removed and this was used to on screw the lag screw from the femoral head. This part of the wound was then thoroughly irrigated with normal saline solution. A Hohmann retractor was placed between the gluteus minimus and the superior hip joint capsule, and a spiked Cobra between the quadratus femoris and the inferior hip joint capsule. This isolated the short  external rotators and piriformis tendons. These were tagged with a #2 Ethibond  suture and cut off their insertion on the intertrochanteric crest. The posterior  capsule was then developed into an  acetabular-based flap from Posterior Superior off of the acetabulum out over the femoral neck and back posterior inferior to the acetabular rim. This flap was tagged with two #2 Ethibond sutures and retracted protecting the sciatic nerve. This exposed the arthritic femoral head and osteophytes. The hip was then flexed and internally rotated, dislocating the femoral head and a standard neck cut performed 1 fingerbreadth above the lesser trochanter.  A spiked Cobra was placed in the cotyloid notch and a Hohmann retractor was then used to lever the femur anteriorly off of the anterior pelvic column. A posterior-inferior wing retractor was placed at the junction of the acetabulum and the ischium completing the acetabular exposure.We then removed the peripheral osteophytes and labrum from the acetabulum. We then reamed the acetabulum up to 55 mm with basket reamers obtaining good coverage in all quadrants. We then irrigated with normal  saline solution and hammered into place a 56 mm pinnacle cup in 45  degrees of abduction and about 20 degrees of anteversion. More  peripheral osteophytes removed and a trial 10-degree liner placed with the  index superior-posterior. The hip was then flexed and internally rotated exposing the  proximal femur, which was entered with the initiating reamer followed by  the axial reamers up to a 17.5 mm full depth and 65mm partial depth. We then conically reamed to 22B to the correct depth for a 42 base neck, knowing that we would probably use the 36 base neck because of the pre-existing short neck and lateral superior subluxation.. The calcar was milled to 22B. A trial cone and stem was inserted in the 20 degrees anteversion, with a +0 71mm trial head. Trial reduction was then performed and excellent stability was noted with at 90 of flexion with 70 of internal rotation and then full extension with maximal external rotation. The hip could not be dislocated in full extension. The knee  could easily flex  to about 130 degrees. We also stretched the abductors at this point,  because of the preexisting adductor contractures. All trial components  were then removed. The acetabulum was irrigated out with normal saline  solution. A titanium [facility] was then screwed into place  followed by a 10-degree polyethylene liner index superior-posterior. On  the femoral side a 22Bsm ZTT1 sleeve was hammered into place, followed by a [phone] SROM stem in 25 degrees of anteversion. At this point, a +0 36 mm ceramic head was  hammered on the stem. The hip was reduced. We checked our stability  one more time and found it to be excellent. The wound was once again  thoroughly irrigated out with normal saline solution pulse lavage. The  capsular flap and short external rotators were repaired back to  the  intertrochanteric crest through drill holes with a #2 Ethibond suture.  The IT band was closed with running 1 Vicryl suture. The subcutaneous  tissue with 0 and 2-0 undyed Vicryl suture and the skin with running  interlocking 3-0 nylon suture. Dressing of Xeroform and Mepilex was  then applied. The patient was then unclamped, rolled supine, awaken extubated and taken to recovery room without difficulty in stable condition.   Paul [name] 08/05/2014, 10:40 AM

## 2014-08-05 NOTE — Progress Notes (Signed)
Utilization review completed.  

## 2014-08-05 NOTE — Anesthesia Procedure Notes (Signed)
Procedure Name: Intubation Date/Time: 08/05/2014 7:42 AM Performed by: Jenne Campus Pre-anesthesia Checklist: Patient identified, Emergency Drugs available, Suction available, Patient being monitored and Timeout performed Patient Re-evaluated:Patient Re-evaluated prior to inductionOxygen Delivery Method: Circle system utilized Preoxygenation: Pre-oxygenation with 100% oxygen Intubation Type: IV induction Ventilation: Mask ventilation without difficulty and Oral airway inserted - appropriate to patient size Grade View: Grade I Tube type: Oral Tube size: 7.5 mm Number of attempts: 1 Airway Equipment and Method: Stylet and Video-laryngoscopy Placement Confirmation: positive ETCO2,  CO2 detector and breath sounds checked- equal and bilateral Secured at: 23 cm Tube secured with: Tape Dental Injury: Teeth and Oropharynx as per pre-operative assessment  Difficulty Due To: Difficulty was anticipated and Difficult Airway- due to limited oral opening Comments: Elective video-glide d/t patient's TMJ. Smooth IV induction. EZ mask with oral airway. DL x 1 video-glide. Atraum OI 7.5 ETT +ETCO2 bbse

## 2014-08-05 NOTE — Progress Notes (Signed)
Lab requesting redraw of T&S due to antibody activity.

## 2014-08-05 NOTE — Transfer of Care (Signed)
Immediate Anesthesia Transfer of Care Note  Patient: Paul [name]  Procedure(s) Performed: Procedure(s): TOTAL HIP ARTHROPLASTY WITH HARDWARE REMOVAL (Left)  Patient Location: PACU  Anesthesia Type:General  Level of Consciousness: awake, oriented and patient cooperative  Airway & Oxygen Therapy: Patient Spontanous Breathing and Patient connected to face mask oxygen  Post-op Assessment: Report given to PACU RN and Post -op Vital signs reviewed and stable  Post vital signs: Reviewed  Complications: No apparent anesthesia complications

## 2014-08-05 NOTE — Interval H&P Note (Signed)
History and Physical Interval Note:  08/05/2014 7:22 AM  Paul [name]  has presented today for surgery, with the diagnosis of [place] EPIPHYSIS/DEGENERATIVE JOINT DISEASE  The various methods of treatment have been discussed with the patient and family. After consideration of risks, benefits and other options for treatment, the patient has consented to  Procedure(s): TOTAL HIP ARTHROPLASTY WITH HARDWARE REMOVAL (Left) as a surgical intervention .  The patient's history has been reviewed, patient examined, no change in status, stable for surgery.  I have reviewed the patient's chart and labs.  Questions were answered to the patient's satisfaction.     Kerin Salen

## 2014-08-05 NOTE — Anesthesia Postprocedure Evaluation (Signed)
  Anesthesia Post-op Note  Patient: Paul [name]  Procedure(s) Performed: Procedure(s): TOTAL HIP ARTHROPLASTY WITH HARDWARE REMOVAL (Left)  Patient Location: PACU  Anesthesia Type:General  Level of Consciousness: awake and alert   Airway and Oxygen Therapy: Patient Spontanous Breathing  Post-op Pain: moderate  Post-op Assessment: Post-op Vital signs reviewed  Post-op Vital Signs: stable  Last Vitals:  Filed Vitals:   08/05/14 1200  BP:   Pulse: 57  Temp:   Resp: 10    Complications: No apparent anesthesia complications

## 2014-08-05 NOTE — Anesthesia Preprocedure Evaluation (Addendum)
Anesthesia Evaluation  Patient identified by MRN, date of birth, ID band Patient awake    Reviewed: Allergy & Precautions, H&P , NPO status , Patient's Chart, lab work & pertinent test results  History of Anesthesia Complications Negative for: history of anesthetic complications  Airway Mallampati: I TM Distance: >3 FB Neck ROM: Full  Mouth opening: Limited Mouth Opening  Dental  (+) Teeth Intact, Dental Advisory Given   Pulmonary Current Smoker,  breath sounds clear to auscultation        Cardiovascular hypertension, + dysrhythmias Rhythm:Regular Rate:Normal     Neuro/Psych negative neurological ROS     GI/Hepatic negative GI ROS, Neg liver ROS,   Endo/Other  negative endocrine ROS  Renal/GU Renal Insufficiency and Renal hypertensionRenal disease     Musculoskeletal  (+) Arthritis -,   Abdominal   Peds  Hematology   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  Reproductive/Obstetrics negative OB ROS                          Anesthesia Physical Anesthesia Plan  ASA: III  Anesthesia Plan: General   Post-op Pain Management:    Induction: Intravenous  Airway Management Planned: Oral ETT  Additional Equipment:   Intra-op Plan:   Post-operative Plan: Extubation in OR  Informed Consent: I have reviewed the patients History and Physical, chart, labs and discussed the procedure including the risks, benefits and alternatives for the proposed anesthesia with the patient or authorized representative who has indicated his/her understanding and acceptance.   Dental advisory given  Plan Discussed with:   Anesthesia Plan Comments:         Anesthesia Quick Evaluation

## 2014-08-06 LAB — CBC
HEMATOCRIT: 28.5 % — AB (ref 39.0–52.0)
HEMOGLOBIN: 9.2 g/dL — AB (ref 13.0–17.0)
MCH: 25 pg — AB (ref 26.0–34.0)
MCHC: 32.3 g/dL (ref 30.0–36.0)
MCV: 77.4 fL — ABNORMAL LOW (ref 78.0–100.0)
Platelets: 155 10*3/uL (ref 150–400)
RBC: 3.68 MIL/uL — ABNORMAL LOW (ref 4.22–5.81)
RDW: 13.1 % (ref 11.5–15.5)
WBC: 6.9 10*3/uL (ref 4.0–10.5)

## 2014-08-06 LAB — BASIC METABOLIC PANEL
Anion gap: 11 (ref 5–15)
BUN: 20 mg/dL (ref 6–23)
CALCIUM: 8.6 mg/dL (ref 8.4–10.5)
CO2: 24 mEq/L (ref 19–32)
CREATININE: 1.62 mg/dL — AB (ref 0.50–1.35)
Chloride: 102 mEq/L (ref 96–112)
GFR, EST AFRICAN AMERICAN: 54 mL/min — AB (ref >90)
GFR, EST NON AFRICAN AMERICAN: 46 mL/min — AB (ref >90)
GLUCOSE: 117 mg/dL — AB (ref 70–99)
Potassium: 4.1 mEq/L (ref 3.7–5.3)
Sodium: 137 mEq/L (ref 137–147)

## 2014-08-06 NOTE — Clinical Social Work Placement (Addendum)
Clinical Social Work Department CLINICAL SOCIAL WORK PLACEMENT NOTE 08/06/2014  Patient:  Paul [name], Paul [name]  Account Number:  [id number] Admit date:  08/05/2014  Clinical Social Worker:  Delrae Sawyers  Date/time:  08/06/2014 01:22 PM  Clinical Social Work is seeking post-discharge placement for this patient at the following level of care:   SKILLED NURSING   (*CSW will update this form in Epic as items are completed)   08/06/2014  Patient/family provided with [facility] Department of Clinical Social Work's list of facilities offering this level of care within the geographic area requested by the patient (or if unable, by the patient's family).  08/06/2014  Patient/family informed of their freedom to choose among providers that offer the needed level of care, that participate in Medicare, Medicaid or managed care program needed by the patient, have an available bed and are willing to accept the patient.  08/06/2014  Patient/family informed of [facility]' ownership interest in [facility], as well as of the fact that they are under no obligation to receive care at this facility.  PASARR submitted to EDS on 08/06/2014 PASARR number received on 08/06/2014  FL2 transmitted to all facilities in geographic area requested by pt/family on  08/06/2014 FL2 transmitted to all facilities within larger geographic area on   Patient informed that his/her managed care company has contracts with or will negotiate with  certain facilities, including the following:     Patient/family informed of bed offers received:  08/06/2014 Patient chooses bed at [facility] Physician recommends and patient chooses bed at    Patient to be transferred to [facility] on  08/07/2014 Patient to be transferred to facility by [facility] Patient and family notified of transfer on 08/07/2014 Name of family member notified:  Pt updated at bedside. Pt alert and oriented x4.  The following physician  request were entered in Epic:   Additional Comments:  Henderson [place] ([phone]) Licensed Clinical Social Worker Neuroscience [phone]) and Medical ICU (54M)

## 2014-08-06 NOTE — Plan of Care (Signed)
Problem: Phase I Progression Outcomes Goal: Dangle or out of bed evening of surgery Outcome: Completed/Met Date Met:  08/06/14 Patient ambulated around the room and to the bathroom using walker + 1 assist

## 2014-08-06 NOTE — Evaluation (Signed)
Occupational Therapy Evaluation Patient Details Name: Paul [name] MRN: [id number] DOB: 1961/03/20 Today's Date: 08/06/2014    History of Present Illness pt presents with L THA and hx of Bil THAs.     Clinical Impression   Pt s/p above. Pt independent with ADLs, PTA. Pt plans to d/c to SNF. All further OT needs can be met in next venue of care.     Follow Up Recommendations  SNF    Equipment Recommendations  Other (comment) (AE)    Recommendations for Other Services       Precautions / Restrictions Precautions Precautions: Posterior Hip Precaution Booklet Issued: Yes (comment) Precaution Comments: Reviewed precautions Restrictions Weight Bearing Restrictions: Yes LLE Weight Bearing: Weight bearing as tolerated      Mobility Bed Mobility               General bed mobility comments: not assessed  Transfers Overall transfer level: Needs assistance Equipment used: Rolling walker (2 wheeled) Transfers: Sit to/from Stand Sit to Stand: Min guard         General transfer comment: cues for technique.    Balance                                            ADL Overall ADL's : Needs assistance/impaired Eating/Feeding: Independent;Sitting   Grooming: Set up;Sitting   Upper Body Bathing: Set up;Sitting   Lower Body Bathing: Sit to/from stand;Moderate assistance   Upper Body Dressing : Set up;Sitting   Lower Body Dressing: Moderate assistance;Sit to/from stand   Toilet Transfer: Min guard;Ambulation;RW;Comfort height toilet   Toileting- Clothing Manipulation and Hygiene: Supervision/safety (standing)       Functional mobility during ADLs: Min guard;Rolling walker;Minimal assistance General ADL Comments: Educated on dressing technique. Discussed AE for ADLs. Educated on Stage manager. Ambulated in hallway.     Vision                     Perception     Praxis      Pertinent Vitals/Pain Pain Assessment: 0-10 Pain  Score: 10-Worst pain ever Pain Location: left hip Pain Descriptors / Indicators: Constant;Aching Pain Intervention(s): Repositioned     Hand Dominance     Extremity/Trunk Assessment Upper Extremity Assessment Upper Extremity Assessment: Overall WFL for tasks assessed   Lower Extremity Assessment Lower Extremity Assessment: Defer to PT evaluation LLE Deficits / Details: Strength limited post-op.   LLE: Unable to fully assess due to pain   Cervical / Trunk Assessment Cervical / Trunk Assessment: Normal   Communication Communication Communication: No difficulties   Cognition Arousal/Alertness: Awake/alert Behavior During Therapy: WFL for tasks assessed/performed Overall Cognitive Status: Within Functional Limits for tasks assessed                     General Comments          Shoulder Instructions      Home Living Family/patient expects to be discharged to:: Skilled nursing facility Living Arrangements: Children;Other (Comment) (pt's 3 year old son lives with him)                               Additional Comments: pt indicates plan for D/C to [facility]      Prior Functioning/Environment Level of Independence: Independent  OT Diagnosis: Acute pain   OT Problem List: Decreased strength;Decreased knowledge of use of DME or AE;Decreased knowledge of precautions;Pain   OT Treatment/Interventions:      OT Goals(Current goals can be found in the care plan section)   OT Frequency:     Barriers to D/C:            Co-evaluation              End of Session Equipment Utilized During Treatment: Gait belt;Rolling walker  Activity Tolerance: Patient tolerated treatment well Patient left: in chair;with call bell/phone within reach   Time: 0940-1010 OT Time Calculation (min): 30 min Charges:  OT General Charges $OT Visit: 1 Procedure OT Evaluation $Initial OT Evaluation Tier I: 1 Procedure OT Treatments $Therapeutic  Activity: 8-22 mins G-CodesBenito Mccreedy OTR/L [phone] 08/06/2014, 11:18 AM

## 2014-08-06 NOTE — Progress Notes (Signed)
Patient ID: Paul [name], male   DOB: 1961-03-29, 52 y.o.   MRN: [id number] PATIENT ID: Paul [name]  MRN: [id number]  DOB/AGE:  16-Jun-1961 / 53 y.o.  1 Day Post-Op Procedure(s) (LRB): TOTAL HIP ARTHROPLASTY WITH HARDWARE REMOVAL (Left)    PROGRESS NOTE Subjective: Patient is alert, oriented, no Nausea, no Vomiting, yes passing gas, no Bowel Movement. Taking PO well. Denies SOB, Chest or Calf Pain. Using Incentive Spirometer, PAS in place. Ambulate WBAT today Patient reports pain as 4 on 0-10 scale  .    Objective: Vital signs in last 24 hours: Filed Vitals:   08/06/14 0153 08/06/14 0300 08/06/14 0400 08/06/14 0555  BP: 131/72   132/65  Pulse: 69   71  Temp: 98.6 F (37 C)   98.8 F (37.1 C)  TempSrc:      Resp: $Remo'18  17 18  'BpLzf$ Height:  5' 11.5" (1.816 m)    Weight:  78.926 kg (174 lb)    SpO2: 100%   100%      Intake/Output from previous day: I/O last 3 completed shifts: In: 3743.8 [P.O.:360; I.V.:3133.8; IV Piggyback:250] Out: 2275 [QZESP:2330; Blood:500]   Intake/Output this shift:     LABORATORY DATA:  Recent Labs  08/05/14 0708 08/05/14 1828  WBC  --  10.3  HGB  --  10.2*  HCT  --  31.6*  PLT  --  167  NA 141  --   K 4.3  --   CL 106  --   CO2 21  --   BUN 27*  --   CREATININE 1.89* 1.83*  GLUCOSE 89  --   CALCIUM 8.9  --     Examination: Neurologically intact ABD soft Neurovascular intact Sensation intact distally Intact pulses distally Dorsiflexion/Plantar flexion intact Incision: dressing C/D/I No cellulitis present Compartment soft} XR AP&Lat of hip shows well placed\fixed THA  Assessment:   1 Day Post-Op Procedure(s) (LRB): TOTAL HIP ARTHROPLASTY WITH HARDWARE REMOVAL (Left) ADDITIONAL DIAGNOSIS:  Expected Acute Blood Loss Anemia, Hypertension and Renal Insufficiency Chronic  Plan: PT/OT WBAT, THA  posterior precautions  DVT Prophylaxis: SCDx72 hrs, ASA 325 mg BID x 2 weeks  DISCHARGE PLAN: Skilled Nursing Facility/Rehab,  patient lives alone and is planning on going to rehabilitation for a week or so after his hospitalization.  DISCHARGE NEEDS: HHPT, HHRN, CPM, Walker and 3-in-1 comode seat

## 2014-08-06 NOTE — Evaluation (Signed)
Physical Therapy Evaluation Patient Details Name: Paul [name] MRN: [id number] DOB: Jan 09, 1961 Today's Date: 08/06/2014   History of Present Illness  pt presents with L THA and hx of Bil THAs.    Clinical Impression  Pt moving well despite c/o very painful.  Pt indicates plan for D/C to SNF for continued rehab prior to returning to home.  Will continue to follow while on acute.      Follow Up Recommendations SNF    Equipment Recommendations  None recommended by PT    Recommendations for Other Services       Precautions / Restrictions Precautions Precautions: Posterior Hip Precaution Booklet Issued: Yes (comment) Restrictions Weight Bearing Restrictions: Yes LLE Weight Bearing: Weight bearing as tolerated      Mobility  Bed Mobility               General bed mobility comments: pt up in room with RN on arrival.    Transfers Overall transfer level: Needs assistance Equipment used: Rolling walker (2 wheeled) Transfers: Sit to/from Stand Sit to Stand: Min guard         General transfer comment: cues for UE use and controlling descent to sitting.    Ambulation/Gait Ambulation/Gait assistance: Min guard Ambulation Distance (Feet): 30 Feet Assistive device: Rolling walker (2 wheeled) Gait Pattern/deviations: Step-to pattern;Decreased step length - right;Decreased stance time - left     General Gait Details: cues for gait sequencing and encouragement.    Stairs            Wheelchair Mobility    Modified Rankin (Stroke Patients Only)       Balance                                             Pertinent Vitals/Pain Pain Assessment: 0-10 Pain Score: 9  Pain Location: L hip Pain Descriptors / Indicators: Constant;Aching Pain Intervention(s): Repositioned;Patient requesting pain meds-RN notified    Home Living Family/patient expects to be discharged to:: Skilled nursing facility                 Additional Comments:  pt indicates plan for D/C to [facility].    Prior Function Level of Independence: Independent               Hand Dominance        Extremity/Trunk Assessment   Upper Extremity Assessment: Defer to OT evaluation           Lower Extremity Assessment: LLE deficits/detail   LLE Deficits / Details: Strength limited post-op.    Cervical / Trunk Assessment: Normal  Communication   Communication: No difficulties  Cognition Arousal/Alertness: Awake/alert Behavior During Therapy: WFL for tasks assessed/performed Overall Cognitive Status: Within Functional Limits for tasks assessed                      General Comments      Exercises Total Joint Exercises Ankle Circles/Pumps: AROM;Both;10 reps Long Arc Quad: AAROM;Left;10 reps      Assessment/Plan    PT Assessment Patient needs continued PT services  PT Diagnosis Abnormality of gait   PT Problem List Decreased strength;Decreased activity tolerance;Decreased balance;Decreased mobility;Decreased knowledge of use of DME;Decreased knowledge of precautions;Pain  PT Treatment Interventions DME instruction;Gait training;Stair training;Therapeutic activities;Functional mobility training;Therapeutic exercise;Balance training;Patient/family education   PT Goals (Current goals can be found in the  Care Plan section) Acute Rehab PT Goals Patient Stated Goal: Walk without pain PT Goal Formulation: With patient Time For Goal Achievement: 08/13/14 Potential to Achieve Goals: Good    Frequency 7X/week   Barriers to discharge        Co-evaluation               End of Session Equipment Utilized During Treatment: Gait belt Activity Tolerance: Patient tolerated treatment well Patient left: in chair;with call bell/phone within reach Nurse Communication: Mobility status         Time: 0122-2411 PT Time Calculation (min): 26 min   Charges:   PT Evaluation $Initial PT Evaluation Tier I: 1 Procedure PT  Treatments $Gait Training: 8-22 mins   PT G CodesCatarina Hartshorn, [place] 08/06/2014, 9:31 AM

## 2014-08-06 NOTE — Clinical Social Work Psychosocial (Signed)
Clinical Social Work Department BRIEF PSYCHOSOCIAL ASSESSMENT 08/06/2014  Patient:  Paul [name], Paul [name]     Account Number:  [id number]     Admit date:  08/05/2014  Clinical Social Worker:  Delrae Sawyers  Date/Time:  08/06/2014 01:17 PM  Referred by:  Physician  Date Referred:  08/06/2014 Referred for  SNF Placement   Other Referral:   none.   Interview type:  Patient Other interview type:   none.    PSYCHOSOCIAL DATA Living Status:  FAMILY Admitted from facility:   Level of care:   Primary support name:  no name provided. Primary support relationship to patient:  CHILD, ADULT Degree of support available:   Pt states he lives with his son who currently works and attends college classes.    CURRENT CONCERNS Current Concerns  Post-Acute Placement   Other Concerns:   none.    SOCIAL WORK ASSESSMENT / PLAN CSW received consult regarding possible SNF placement at time of discharge. CSW met with pt at bedside to discuss discharge disposition. Pt stated he would prefer placement at [facility] once medically stable for discharge.    CSW spoke with [facility] admissions liaison regarding information above. [facility] admissions liaison to meet with pt at bedside to discuss discharge disposition.    CSW to continue to follow and assist with discharge planning needs.   Assessment/plan status:  Psychosocial Support/Ongoing Assessment of Needs Other assessment/ plan:   none.   Information/referral to community resources:   Pt to be discharged to [facility] once medically stable for discharge.    PATIENT'S/FAMILY'S RESPONSE TO PLAN OF CARE: Pt understanding and agreeable to CSW plan of care. Pt expressed no further questions or concerns at this time.       Lubertha Sayres, [place] ([phone]) Licensed Clinical Social Worker Neuroscience [phone]) and Medical ICU (47M)

## 2014-08-06 NOTE — Plan of Care (Signed)
Problem: Consults Goal: Diagnosis- Total Joint Replacement Primary Total Hip     

## 2014-08-06 NOTE — Care Management Note (Signed)
CARE MANAGEMENT NOTE 08/06/2014  Patient:  Paul [name], Paul [name]   Account Number:  [id number]  Date Initiated:  08/06/2014  Documentation initiated by:  Ricki Miller  Subjective/Objective Assessment:   53 yr old admitted with DJD of left hip, s/p left total hip arthroplasty     Action/Plan:   Patient is for shortterm rehab at [facility] Social worker is aware.   Anticipated DC Date:  08/07/2014   Anticipated DC Plan:  SKILLED NURSING FACILITY  In-house referral  Clinical Social Worker      DC Planning Services  CM consult      Choice offered to / List presented to:     DME arranged  NA        [place] arranged  NA      Status of service:  Completed, signed off Medicare Important Message given?  NA - LOS <3 / Initial given by admissions (If response is "NO", the following Medicare IM given date fields will be blank) Date Medicare IM given:   Medicare IM given by:   Date Additional Medicare IM given:   Additional Medicare IM given by:    Discharge Disposition:  [place]  Per UR Regulation:  Reviewed for med. necessity/level of care/duration of stay  If discussed at [place] of Stay Meetings, dates discussed:    Comments:

## 2014-08-07 ENCOUNTER — Encounter (HOSPITAL_COMMUNITY): Payer: Self-pay | Admitting: Orthopedic Surgery

## 2014-08-07 LAB — CBC
HCT: 26.1 % — ABNORMAL LOW (ref 39.0–52.0)
Hemoglobin: 8.6 g/dL — ABNORMAL LOW (ref 13.0–17.0)
MCH: 24.4 pg — AB (ref 26.0–34.0)
MCHC: 33 g/dL (ref 30.0–36.0)
MCV: 74.1 fL — AB (ref 78.0–100.0)
Platelets: 144 10*3/uL — ABNORMAL LOW (ref 150–400)
RBC: 3.52 MIL/uL — ABNORMAL LOW (ref 4.22–5.81)
RDW: 12.9 % (ref 11.5–15.5)
WBC: 8.4 10*3/uL (ref 4.0–10.5)

## 2014-08-07 NOTE — Discharge Summary (Signed)
Patient ID: Paul [name] MRN: [id number] DOB/AGE: [birth year] 53 y.o.  Admit date: 08/05/2014 Discharge date: 08/07/2014  Admission Diagnoses:  Active Problems:   Arthritis of left hip   Discharge Diagnoses:  Same  Past Medical History  Diagnosis Date  . Hypertensive cardiomyopathy   . Chronic kidney disease   . Atrial fibrillation     Isolated episode  . Hypertension     denies  . Dysrhythmia     atrial fib 11/13  . GERD (gastroesophageal reflux disease)     occ  . Arthritis   . TMJ arthralgia     Surgeries: Procedure(s): TOTAL HIP ARTHROPLASTY WITH HARDWARE REMOVAL on 08/05/2014   Consultants:    Discharged Condition: Improved  Hospital Course: WASIF SIMONICH is an 53 y.o. male who was admitted 08/05/2014 for operative treatment of<principal problem not specified>. Patient has severe unremitting pain that affects sleep, daily activities, and work/hobbies. After pre-op clearance the patient was taken to the operating room on 08/05/2014 and underwent  Procedure(s): TOTAL HIP ARTHROPLASTY WITH HARDWARE REMOVAL.    Patient was given perioperative antibiotics: Anti-infectives   Start     Dose/Rate Route Frequency Ordered Stop   08/05/14 0600  ceFAZolin (ANCEF) IVPB 2 g/50 mL premix     2 g 100 mL/hr over 30 Minutes Intravenous On call to O.R. 08/04/14 1337 08/05/14 0742       Patient was given sequential compression devices, early ambulation, and chemoprophylaxis to prevent DVT.  Patient benefited maximally from hospital stay and there were no complications.    Recent vital signs: Patient Vitals for the past 24 hrs:  BP Temp Temp src Pulse Resp SpO2  08/07/14 0545 130/74 mmHg 98.5 F (36.9 C) - 81 18 95 %  08/07/14 0400 - - - - 17 -  08/06/14 2305 - - - - 17 -  08/06/14 2048 124/64 mmHg 99.5 F (37.5 C) - 78 18 97 %  08/06/14 2000 - - - - 17 -  08/06/14 1100 122/54 mmHg 97.8 F (36.6 C) Oral 70 17 98 %  08/06/14 0841 123/58 mmHg 98.2 F (36.8 C) Oral 70  17 98 %     Recent laboratory studies:  Recent Labs  08/05/14 0708  08/05/14 1828 08/06/14 0631 08/07/14 0515  WBC  --   < > 10.3 6.9 8.4  HGB  --   < > 10.2* 9.2* 8.6*  HCT  --   < > 31.6* 28.5* 26.1*  PLT  --   < > 167 155 144*  NA 141  --   --  137  --   K 4.3  --   --  4.1  --   CL 106  --   --  102  --   CO2 21  --   --  24  --   BUN 27*  --   --  20  --   CREATININE 1.89*  --  1.83* 1.62*  --   GLUCOSE 89  --   --  117*  --   CALCIUM 8.9  --   --  8.6  --   < > = values in this interval not displayed.   Discharge Medications:     Medication List    STOP taking these medications       HYDROcodone-acetaminophen 5-325 MG per tablet  Commonly known as:  NORCO/VICODIN      TAKE these medications       enoxaparin 40 MG/0.4ML injection  Commonly  known as:  LOVENOX  Inject 0.4 mLs (40 mg total) into the skin daily.     methocarbamol 500 MG tablet  Commonly known as:  ROBAXIN  Take 1 tablet (500 mg total) by mouth 2 (two) times daily with a meal.     nortriptyline 25 MG capsule  Commonly known as:  PAMELOR  Take 1 capsule (25 mg total) by mouth 2 (two) times daily as needed (for jaw pain).     oxyCODONE-acetaminophen 5-325 MG per tablet  Commonly known as:  PERCOCET/ROXICET  Take 1-2 tablets by mouth every 4 (four) hours as needed for severe pain.        Diagnostic Studies: Dg Chest 2 View  07/29/2014   CLINICAL DATA:  Preop, left hip replacement  EXAM: CHEST  2 VIEW  COMPARISON:  None.  FINDINGS: Cardiomediastinal silhouette is unremarkable. No acute infiltrate or pleural effusion. No pulmonary edema. Mild degenerative changes thoracic spine.  IMPRESSION: No active cardiopulmonary disease.   Electronically Signed   By: Lahoma Crocker M.D.   On: 07/29/2014 13:05   Dg Pelvis Portable  08/05/2014   CLINICAL DATA:  postop  EXAM: PORTABLE PELVIS 1-2 VIEWS  COMPARISON:  10/02/2012 and earlier studies  FINDINGS: Interval left hip arthroplasty with femoral and acetabular  components in expected location. Previous plate and sliding screw have been removed. The Previous of right hip arthroplasty components are stable in appearance, distal tip of the femoral stem not visualized. No fracture or dislocation. Exuberant ossification around the old right intertrochanteric fracture. Bilateral pelvic phleboliths.  IMPRESSION: 1. Interval left hip arthroplasty without fracture or other apparent complication.   Electronically Signed   By: Arne Cleveland M.D.   On: 08/05/2014 12:20    Disposition: 06-Home-Health Care Svc      Discharge Instructions   Call MD / Call 911    Complete by:  As directed   If you experience chest pain or shortness of breath, CALL 911 and be transported to the hospital emergency room.  If you develope a fever above 101 F, pus (white drainage) or increased drainage or redness at the wound, or calf pain, call your surgeon's office.     Change dressing    Complete by:  As directed   You may change your dressing on day 5, then change the dressing daily with sterile 4 x 4 inch gauze dressing and paper tape.  You may clean the incision with alcohol prior to redressing     Constipation Prevention    Complete by:  As directed   Drink plenty of fluids.  Prune juice may be helpful.  You may use a stool softener, such as Colace (over the counter) 100 mg twice a day.  Use MiraLax (over the counter) for constipation as needed.     Diet - low sodium heart healthy    Complete by:  As directed      Discharge instructions    Complete by:  As directed   Follow up in office with Dr. Mayer Camel in 2 weeks.     Driving restrictions    Complete by:  As directed   No driving for 2 weeks     Follow the hip precautions as taught in Physical Therapy    Complete by:  As directed      Increase activity slowly as tolerated    Complete by:  As directed      Patient may shower    Complete by:  As directed  You may shower without a dressing once there is no drainage.  Do not  wash over the wound.  If drainage remains, cover wound with plastic wrap and then shower.           Follow-up Information   Follow up with Kerin Salen, MD In 2 weeks.   Specialty:  Orthopedic Surgery   Contact information:   [place] [phone] [phone]        Signed: Hardin Negus Nahlia Hellmann R 08/07/2014, 7:50 AM

## 2014-08-07 NOTE — Progress Notes (Addendum)
PATIENT ID: Paul [name]  MRN: [id number]  DOB/AGE:  Oct 06, 1961 / 53 y.o.  2 Days Post-Op Procedure(s) (LRB): TOTAL HIP ARTHROPLASTY WITH HARDWARE REMOVAL (Left)    PROGRESS NOTE Subjective: Patient is alert, oriented, no Nausea, no Vomiting, yes passing gas, no Bowel Movement. Taking PO well with pt up and eating in chair. Denies SOB, Chest or Calf Pain. Using Incentive Spirometer, PAS in place. Ambulate WBAT Patient reports pain as 8 on 0-10 scale  .    Objective: Vital signs in last 24 hours: Filed Vitals:   08/06/14 2048 08/06/14 2305 08/07/14 0400 08/07/14 0545  BP: 124/64   130/74  Pulse: 78   81  Temp: 99.5 F (37.5 C)   98.5 F (36.9 C)  TempSrc:      Resp: $Remo'18 17 17 18  'gjIdh$ Height:      Weight:      SpO2: 97%   95%      Intake/Output from previous day: I/O last 3 completed shifts: In: 3583.4 [P.O.:2520; I.V.:1063.4] Out: 3325 [DPOEU:2353]   Intake/Output this shift:     LABORATORY DATA:  Recent Labs  08/05/14 0708  08/05/14 1828 08/06/14 0631 08/07/14 0515  WBC  --   < > 10.3 6.9 8.4  HGB  --   < > 10.2* 9.2* 8.6*  HCT  --   < > 31.6* 28.5* 26.1*  PLT  --   < > 167 155 144*  NA 141  --   --  137  --   K 4.3  --   --  4.1  --   CL 106  --   --  102  --   CO2 21  --   --  24  --   BUN 27*  --   --  20  --   CREATININE 1.89*  --  1.83* 1.62*  --   GLUCOSE 89  --   --  117*  --   CALCIUM 8.9  --   --  8.6  --   < > = values in this interval not displayed.  Examination: Neurologically intact Neurovascular intact Sensation intact distally Intact pulses distally Dorsiflexion/Plantar flexion intact Incision: scant drainage No cellulitis present Compartment soft} XR AP&Lat of hip shows well placed\fixed THA  Assessment:   2 Days Post-Op Procedure(s) (LRB): TOTAL HIP ARTHROPLASTY WITH HARDWARE REMOVAL (Left) ADDITIONAL DIAGNOSIS:  Expected Acute Blood Loss Anemia, Hypertension and Renal Insufficiency Chronic  Plan: PT/OT WBAT, THA  posterior  precautions  DVT Prophylaxis: SCDx72 hrs, lovenox x 2 weeks.  DISCHARGE PLAN: Skilled Nursing Facility/Rehab, [facility] when bed available and pt has passed PT goals.  DISCHARGE NEEDS: HHPT, HHRN, Walker and 3-in-1 comode seat

## 2014-08-07 NOTE — Progress Notes (Signed)
Physical Therapy Treatment Patient Details Name: Paul [name] MRN: [id number] DOB: December 18, 1960 Today's Date: 08/07/2014    History of Present Illness pt presents with L THA and hx of Bil THAs.      PT Comments    Patient progressing well with overall mobility. Very motivated to work with therapy. Planning to DC to [facility] for ongoing therapy as patient lives alone.   Follow Up Recommendations  SNF     Equipment Recommendations  None recommended by PT    Recommendations for Other Services       Precautions / Restrictions Precautions Precautions: Posterior Hip Precaution Comments: Patient able to recall all precautions Restrictions Weight Bearing Restrictions: Yes LLE Weight Bearing: Weight bearing as tolerated    Mobility  Bed Mobility Overal bed mobility: Needs Assistance Bed Mobility: Supine to Sit;Sit to Supine           General bed mobility comments: A for LLE back into bed and cues for safe positoining  Transfers Overall transfer level: Needs assistance Equipment used: Rolling walker (2 wheeled) Transfers: Sit to/from Stand Sit to Stand: Min guard         General transfer comment: cues for technique.  Ambulation/Gait Ambulation/Gait assistance: Min guard Ambulation Distance (Feet): 200 Feet Assistive device: Rolling walker (2 wheeled) Gait Pattern/deviations: Step-through pattern;Decreased stride length   Gait velocity interpretation: Below normal speed for age/gender General Gait Details: cues for gait sequencing and increase weight through LEs   Stairs            Wheelchair Mobility    Modified Rankin (Stroke Patients Only)       Balance                                    Cognition Arousal/Alertness: Awake/alert Behavior During Therapy: WFL for tasks assessed/performed Overall Cognitive Status: Within Functional Limits for tasks assessed                      Exercises Total Joint  Exercises Long Arc QuadSinclair Ship;Left;5 reps Knee Flexion: AAROM;Left;5 reps Marching in Standing: AROM;Left;5 reps    General Comments        Pertinent Vitals/Pain Pain Location: L hip Pain Descriptors / Indicators: Aching Pain Intervention(s): Monitored during session;Limited activity within patient's tolerance    Home Living                      Prior Function            PT Goals (current goals can now be found in the care plan section) Progress towards PT goals: Progressing toward goals    Frequency  7X/week    PT Plan Current plan remains appropriate    Co-evaluation             End of Session Equipment Utilized During Treatment: Gait belt Activity Tolerance: Patient tolerated treatment well Patient left: in bed;with call bell/phone within reach     Time: 1021-1049 PT Time Calculation (min): 28 min  Charges:  $Gait Training: 8-22 mins $Therapeutic Exercise: 8-22 mins                    G Codes:      Jacqualyn Posey 08/07/2014, 11:04 AM  08/07/2014 Jacqualyn Posey PTA [phone] pager [phone] office

## 2014-08-07 NOTE — Clinical Social Work Note (Signed)
Pt to be discharged to [facility]. Pt updated at bedside regarding discharge.  [employer] Medicare authorization: [id number]  [facility]: [phone]. Pt to be transported via personal vehicle (pt's son).  Lubertha Sayres, [place] [phone]) Licensed Clinical Social Worker Neuroscience [phone]) and Medical ICU (16M)

## 2014-08-08 ENCOUNTER — Other Ambulatory Visit: Payer: Self-pay | Admitting: *Deleted

## 2014-08-08 MED ORDER — HYDROMORPHONE HCL 2 MG PO TABS: ORAL_TABLET | ORAL | Status: DC

## 2014-08-08 NOTE — Telephone Encounter (Signed)
[facility] 

## 2014-08-09 ENCOUNTER — Non-Acute Institutional Stay (SKILLED_NURSING_FACILITY): Payer: Medicare HMO | Admitting: Internal Medicine

## 2014-08-09 ENCOUNTER — Other Ambulatory Visit: Payer: Self-pay | Admitting: *Deleted

## 2014-08-09 DIAGNOSIS — I10 Essential (primary) hypertension: Secondary | ICD-10-CM

## 2014-08-09 DIAGNOSIS — I480 Paroxysmal atrial fibrillation: Secondary | ICD-10-CM

## 2014-08-09 DIAGNOSIS — M1612 Unilateral primary osteoarthritis, left hip: Secondary | ICD-10-CM

## 2014-08-09 DIAGNOSIS — I43 Cardiomyopathy in diseases classified elsewhere: Secondary | ICD-10-CM

## 2014-08-09 DIAGNOSIS — I429 Cardiomyopathy, unspecified: Secondary | ICD-10-CM

## 2014-08-09 DIAGNOSIS — M1990 Unspecified osteoarthritis, unspecified site: Secondary | ICD-10-CM

## 2014-08-09 DIAGNOSIS — I119 Hypertensive heart disease without heart failure: Secondary | ICD-10-CM

## 2014-08-09 MED ORDER — OXYCODONE-ACETAMINOPHEN 5-325 MG PO TABS: ORAL_TABLET | ORAL | Status: DC

## 2014-08-09 NOTE — Telephone Encounter (Signed)
[facility] 

## 2014-08-10 NOTE — Progress Notes (Signed)
HISTORY & PHYSICAL  DATE: 08/09/2014   FACILITY: [facility]  LEVEL OF CARE: SNF (31)  ALLERGIES:  Allergies  Allergen Reactions  . Ibuprofen Other (See Comments)    Kidney damage  . Nsaids     REACTION: nsaid induced nephropathy    CHIEF COMPLAINT:  Manage left hip OA, Cardiomyopathy & atrial fibrillation  HISTORY OF PRESENT ILLNESS: pt is a 53 y/o AA male.  HIP OSTEOARTHRITIS: patient had advanced end stage OA of the hip with progressively worsening pain & dysfunction.  Pt failed non-surgical conservative management.  Therefore pt underwent total hip arthroplasty with hardware removal & tolerated the procedure well.  Pt denies hip pain currently.  Pt was admitted to this facility for short term rehabilitation.  ATRIAL FIBRILLATION: the patients atrial fibrillation remains stable.  The patient denies DOE, tachycardia, orthopnea, transient neurological sx, pedal edema, palpitations, & PNDs.  No complications noted from the medications currently being used.  CARDIOMYOPATHY: The patient's cardiomyopathy remains stable. Patient denies increasing lower extremity swelling, shortness of breath, chest pain, palpitations, orthopnea or PNDs. No complications reported from the medications currently being used.  PAST MEDICAL HISTORY :  Past Medical History  Diagnosis Date  . Hypertensive cardiomyopathy   . Chronic kidney disease   . Atrial fibrillation     Isolated episode  . Hypertension     denies  . Dysrhythmia     atrial fib 11/13  . GERD (gastroesophageal reflux disease)     occ  . Arthritis   . TMJ arthralgia     PAST SURGICAL HISTORY: Past Surgical History  Procedure Laterality Date  . Hip surgery Left 72    lft   . Total hip arthroplasty Right 03,01    x2  . Appendectomy    . Laparoscopy  09/22/2012    Procedure: LAPAROSCOPY DIAGNOSTIC;  Surgeon: Harl Bowie, MD;  Location: [facility];  Service: General;  Laterality: N/A;  .  Laparoscopic appendectomy  09/22/2012    Procedure: APPENDECTOMY LAPAROSCOPIC;  Surgeon: Harl Bowie, MD;  Location: [facility];  Service: General;;  . Total hip arthroplasty with hardware removal Left 08/05/2014    Procedure: TOTAL HIP ARTHROPLASTY WITH HARDWARE REMOVAL;  Surgeon: Kerin Salen, MD;  Location: [facility];  Service: Orthopedics;  Laterality: Left;    SOCIAL HISTORY:  reports that he has been smoking Cigarettes.  He has a 2.5 pack-year smoking history. He has never used smokeless tobacco. He reports that he drinks about 1.2 ounces of alcohol per week. He reports that he does not use illicit drugs.  FAMILY HISTORY: None  CURRENT MEDICATIONS: Reviewed per MAR/see medication list  REVIEW OF SYSTEMS:  See HPI otherwise 14 point ROS is negative.  PHYSICAL EXAMINATION  VS:  See VS section  GENERAL: no acute distress, normal body habitus EYES: conjunctivae normal, sclerae normal, normal eye lids MOUTH/THROAT: lips without lesions,no lesions in the mouth,tongue is without lesions,uvula elevates in midline NECK: supple, trachea midline, no neck masses, no thyroid tenderness, no thyromegaly LYMPHATICS: no LAN in the neck, no supraclavicular LAN RESPIRATORY: breathing is even & unlabored, BS CTAB CARDIAC: heart rate is irregularly irregular, no murmur,no extra heart sounds, no edema GI:  ABDOMEN: abdomen soft, normal BS, no masses, no tenderness  LIVER/SPLEEN: no hepatomegaly, no splenomegaly MUSCULOSKELETAL: HEAD: normal to inspection  EXTREMITIES: LEFT UPPER EXTREMITY: full range of motion, normal strength & tone RIGHT UPPER EXTREMITY:  full range of motion, normal  strength & tone LEFT LOWER EXTREMITY:  range of motion not tested due to surgery, normal strength & tone RIGHT LOWER EXTREMITY:  Moderate  range of motion, normal strength & tone PSYCHIATRIC: the patient is alert & oriented to person, affect & behavior appropriate  LABS/RADIOLOGY:  Labs reviewed: Basic  Metabolic Panel:  Recent Labs  41/49/24 1513 08/05/14 0708 08/05/14 1828 08/06/14 0631  NA 143 141  --  137  K 4.7 4.3  --  4.1  CL 113* 106  --  102  CO2 24 21  --  24  GLUCOSE 65* 89  --  117*  BUN 24* 27*  --  20  CREATININE 1.93* 1.89* 1.83* 1.62*  CALCIUM 8.9 8.9  --  8.6   Liver Function Tests:  Recent Labs  07/23/14 1513  AST 20  ALT 15  ALKPHOS 54  BILITOT 1.5*  PROT 6.4  ALBUMIN 4.2   CBC:  Recent Labs  07/29/14 1126 08/05/14 1828 08/06/14 0631 08/07/14 0515  WBC 4.9 10.3 6.9 8.4  NEUTROABS 2.8  --   --   --   HGB 11.5* 10.2* 9.2* 8.6*  HCT 35.5* 31.6* 28.5* 26.1*  MCV 77.9* 76.5* 77.4* 74.1*  PLT 165 167 155 144*    Transthoracic Echocardiography  (Report amended )  Patient:    Paul [name], Paul [name] MR #:       [id number] Study Date: 09/24/2012 Gender:     M Age:        19 Height:     180.3cm Weight:     88kg BSA:        2.58m^2 Pt. Status: Room:       1230    ORDERING     [place], Peter  REFERRING    [place], Theron Arista  PERFORMING   [place], [facility]  ADMITTING    Abigail Miyamoto A  ATTENDING    Ccs, Md  SONOGRAPHER  [place] Kung cc:  ------------------------------------------------------------ LV EF: 65% -   70%  ------------------------------------------------------------ Indications:   unable to put info in report. My login is denied at this moment. Veda Canning, RDCS  ------------------------------------------------------------ Study Conclusions  - Left ventricle: The cavity size was normal. Wall thickness   was increased in a pattern of moderate LVH. Systolic   function was vigorous. The estimated ejection fraction was   in the range of 65% to 70%. There was dynamic obstruction,   with mid-cavity obliteration. Wall motion was normal;   there were no regional wall motion abnormalities. Features   are consistent with a pseudonormal left ventricular   filling pattern, with concomitant abnormal relaxation and   increased filling  pressure (grade 2 diastolic   dysfunction). - Left atrium: The atrium was moderately dilated. - Right atrium: The atrium was mildly to moderately dilated. Transthoracic echocardiography.  M-mode, complete 2D, spectral Doppler, and color Doppler.  Height:  Height: 180.3cm. Height: 71in.  Weight:  Weight: 88kg. Weight: 193.6lb.  Body mass index:  BMI: 27.1kg/m^2.  Body surface area:    BSA: 2.34m^2.  Patient status:  Inpatient.  ------------------------------------------------------------  ------------------------------------------------------------ Left ventricle:  The cavity size was normal. Wall thickness was increased in a pattern of moderate LVH. Systolic function was vigorous. The estimated ejection fraction was in the range of 65% to 70%. There was dynamic obstruction, with mid-cavity obliteration. Wall motion was normal; there were no regional wall motion abnormalities. Features are consistent with a pseudonormal left ventricular filling pattern, with concomitant abnormal relaxation and increased filling pressure (grade  2 diastolic dysfunction).  ------------------------------------------------------------ Aortic valve:   Trileaflet; normal thickness, mildly calcified leaflets. Cusp separation was normal. Mobility was not restricted.  Doppler:  Transvalvular velocity was within the normal range. There was no stenosis.  No regurgitation.   VTI ratio of LVOT to aortic valve: 0.73. Valve area: 3.04cm^2(VTI). Indexed valve area: 1.46cm^2/m^2 (VTI). Peak velocity ratio of LVOT to aortic valve: 0.69. Valve area: 2.86cm^2 (Vmax). Indexed valve area: 1.38cm^2/m^2 (Vmax). Mean gradient: 23mm Hg (S). Peak gradient: 47mm Hg (S).  ------------------------------------------------------------ Aorta:  Aortic root: The aortic root was normal in size. Ascending aorta: The ascending aorta was normal in size.  ------------------------------------------------------------ Mitral valve:    Structurally normal valve.   Leaflet separation was normal. Mobility was not restricted. Doppler:  Transvalvular velocity was within the normal range. There was no evidence for stenosis.  No regurgitation.    Peak gradient: 60mm Hg (D).  ------------------------------------------------------------ Left atrium:  The atrium was moderately dilated.  ------------------------------------------------------------ Right ventricle:  The cavity size was normal. Wall thickness was normal. Systolic function was normal.  ------------------------------------------------------------ Pulmonic valve:   Poorly visualized.  Doppler: Transvalvular velocity was within the normal range. There was no evidence for stenosis.  No significant regurgitation.   ------------------------------------------------------------ Tricuspid valve:   Structurally normal valve.   Leaflet separation was normal.  Doppler:  Transvalvular velocity was within the normal range.  Trivial regurgitation.  ------------------------------------------------------------ Pulmonary artery:   Systolic pressure was within the normal range.  ------------------------------------------------------------ Right atrium:  The atrium was mildly to moderately dilated. 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  ------------------------------------------------------------ Pericardium:  There was no pericardial effusion.  ------------------------------------------------------------ Systemic veins: Inferior vena cava: The vessel was dilated; the respirophasic diameter changes were blunted (< 50%); findings are consistent with elevated central venous pressure.  ------------------------------------------------------------  2D measurements        Normal  Doppler measurements   Normal Left ventricle                 LVOT LVID ED,     52 mm     43-52   Peak vel,   166 cm/s   ------ chord,                         S PLAX                            VTI, S     34.6 cm     ------ LVID ES,     34 mm     23-38   Peak         11 mm Hg  ------ chord,                         gradient, PLAX                           S FS, chord,   35 %      >29     Aortic valve PLAX                           Peak vel,   241 cm/s   ------ LVPW, ED     17 mm     ------  S IVS/LVPW   0.94        <1.3  Mean vel,   163 cm/s   ------ ratio, ED                      S Ventricular septum             VTI, S     47.2 cm     ------ IVS, ED      16 mm     ------  Mean         13 mm Hg  ------ LVOT                           gradient, Diam, S      23 mm     ------  S Area       4.15 cm^2   ------  Peak         23 mm Hg  ------ Aorta                          gradient, Root diam,   36 mm     ------  S ED                             VTI ratio  0.73        ------ Left atrium                    LVOT/AV AP dim       50 mm     ------  Area, VTI  3.04 cm^2   ------ AP dim      2.4 cm/m^2 <2.2    Area index 1.46 cm^2/m ------ index                          (VTI)           ^2                                Peak vel   0.69        ------                                ratio,                                LVOT/AV                                Area, Vmax 2.86 cm^2   ------                                Area index 1.38 cm^2/m ------                                (Vmax)          ^2                                Mitral valve  Peak E vel 85.4 cm/s   ------                                Peak A vel 65.2 cm/s   ------                                Decelerati  232 ms     150-23                                on time                0                                Peak          3 mm Hg  ------                                gradient,                                D                                Peak E/A    1.3        ------                                ratio   CHEST  2 VIEW   COMPARISON:  None.   FINDINGS: Cardiomediastinal silhouette  is unremarkable. No acute infiltrate or pleural effusion. No pulmonary edema. Mild degenerative changes thoracic spine.   IMPRESSION: No active cardiopulmonary disease. PORTABLE PELVIS 1-2 VIEWS   COMPARISON:  10/02/2012 and earlier studies   FINDINGS: Interval left hip arthroplasty with femoral and acetabular components in expected location. Previous plate and sliding screw have been removed. The Previous of right hip arthroplasty components are stable in appearance, distal tip of the femoral stem not visualized. No fracture or dislocation. Exuberant ossification around the old right intertrochanteric fracture. Bilateral pelvic phleboliths.   IMPRESSION: 1. Interval left hip arthroplasty without fracture or other apparent complication.    ASSESSMENT/PLAN:  Left hip OA-s/p total hip arthroplasty with hardware removal.  Cont. Rehab.  Atrial fibrillation-rate controlled. Hypertensive cardiomyopathy-compensated HTN-BP borderline.  Will monitor. CKD-recheck Acute blood loss anemia-check Hb.  Check Fe studies due to microcytosis. Check cbc & bmp  I have reviewed patient's medical records received at admission/from hospitalization.  CPT CODE: 76720  Gayani Y Dasanayaka, [place] [phone]

## 2014-08-12 ENCOUNTER — Other Ambulatory Visit: Payer: Self-pay | Admitting: *Deleted

## 2014-08-12 MED ORDER — OXYCODONE-ACETAMINOPHEN 7.5-325 MG PO TABS: ORAL_TABLET | ORAL | Status: DC

## 2014-08-12 NOTE — Telephone Encounter (Signed)
[facility] 

## 2014-08-13 LAB — TYPE AND SCREEN
ABO/RH(D): O POS
Antibody Screen: POSITIVE
DAT, IgG: NEGATIVE
UNIT DIVISION: 0
UNIT DIVISION: 0
Unit division: 0
Unit division: 0
Unit division: 0

## 2014-08-21 ENCOUNTER — Other Ambulatory Visit: Payer: Self-pay | Admitting: *Deleted

## 2014-08-21 MED ORDER — LORAZEPAM 1 MG PO TABS: ORAL_TABLET | ORAL | Status: DC

## 2014-08-21 MED ORDER — OXYCODONE-ACETAMINOPHEN 7.5-325 MG PO TABS: ORAL_TABLET | ORAL | Status: DC

## 2014-08-21 NOTE — Telephone Encounter (Signed)
[facility] 

## 2014-08-23 ENCOUNTER — Encounter: Payer: Self-pay | Admitting: Adult Health

## 2014-08-23 ENCOUNTER — Non-Acute Institutional Stay (SKILLED_NURSING_FACILITY): Payer: Medicare HMO | Admitting: Adult Health

## 2014-08-23 DIAGNOSIS — M2660 Temporomandibular joint disorder, unspecified: Secondary | ICD-10-CM

## 2014-08-23 DIAGNOSIS — M1612 Unilateral primary osteoarthritis, left hip: Secondary | ICD-10-CM

## 2014-08-23 DIAGNOSIS — M26609 Unspecified temporomandibular joint disorder, unspecified side: Secondary | ICD-10-CM

## 2014-08-23 DIAGNOSIS — M1990 Unspecified osteoarthritis, unspecified site: Secondary | ICD-10-CM

## 2014-08-23 DIAGNOSIS — I480 Paroxysmal atrial fibrillation: Secondary | ICD-10-CM

## 2014-08-23 DIAGNOSIS — L299 Pruritus, unspecified: Secondary | ICD-10-CM

## 2014-08-23 DIAGNOSIS — I43 Cardiomyopathy in diseases classified elsewhere: Secondary | ICD-10-CM

## 2014-08-23 DIAGNOSIS — I10 Essential (primary) hypertension: Secondary | ICD-10-CM

## 2014-08-23 DIAGNOSIS — I429 Cardiomyopathy, unspecified: Secondary | ICD-10-CM

## 2014-08-23 DIAGNOSIS — I119 Hypertensive heart disease without heart failure: Secondary | ICD-10-CM

## 2014-08-23 DIAGNOSIS — M25552 Pain in left hip: Secondary | ICD-10-CM

## 2014-08-23 DIAGNOSIS — M25551 Pain in right hip: Secondary | ICD-10-CM

## 2014-08-23 NOTE — Progress Notes (Signed)
Patient ID: Paul [name], male   DOB: 1961/10/09, 53 y.o.   MRN: [id number]              PROGRESS NOTE  DATE: 08/23/2014   FACILITY: [facility]  LEVEL OF CARE: SNF (31)  Discharge Notes  HISTORY OF PRESENT ILLNESS:   This is a 53 year old male who is for discharge home and will have outpatient rehabilitation. DME: Rolling walker. He has been admitted to [facility] on 08/07/14 from [facility] with Arthritis S/P Left total hip arthroplasty with hardware removal.   Patient was admitted to this facility for short-term rehabilitation after the patient's recent hospitalization.  Patient has completed SNF rehabilitation and therapy has cleared the patient for discharge.  Reassessment of ongoing problem(s):  HTN: Pt 's HTN remains stable.  Denies CP, sob, DOE, pedal edema, headaches, dizziness or visual disturbances.  No medication currently being used.  Last BP : 128/80  ATRIAL FIBRILLATION: the patients atrial fibrillation remains stable.  The patient denies DOE, tachycardia, orthopnea, transient neurological sx, pedal edema, palpitations, & PNDs.  No medications currently being used.  ANEMIA: The anemia has been stable. The patient denies fatigue, melena or hematochezia. No complications from the medications currently being used. 10/15 hgb 9.7  Past Medical History  Diagnosis Date  . Hypertensive cardiomyopathy   . Chronic kidney disease   . Atrial fibrillation     Isolated episode  . Hypertension     denies  . Dysrhythmia     atrial fib 11/13  . GERD (gastroesophageal reflux disease)     occ  . Arthritis   . TMJ arthralgia      Reviewed.  No changes/see problem list  CURRENT MEDICATIONS: Reviewed per MAR/see medication list    Allergies  Allergen Reactions  . Ibuprofen Other (See Comments)    Kidney damage  . Nsaids     REACTION: nsaid induced nephropathy     REVIEW OF SYSTEMS:  GENERAL: no change in appetite, no fatigue, no weight  changes, no fever, chills or weakness SKIN:  +itching of feet RESPIRATORY: no cough, SOB, DOE, wheezing, hemoptysis CARDIAC: no chest pain, edema or palpitations GI: no abdominal pain, diarrhea, constipation, heart burn, nausea or vomiting  PHYSICAL EXAMINATION  GENERAL: no acute distress, normal body habitus EYES: conjunctivae normal, normal eye lids NECK: supple, trachea midline, no neck masses, no thyroid tenderness, no thyromegaly LYMPHATICS: no LAN in the neck, no supraclavicular LAN RESPIRATORY: breathing is even & unlabored, BS CTAB CARDIAC: Irregularly irregular heart rate, no murmur,no extra heart sounds, no edema GI: abdomen soft, normal BS, no masses, no tenderness, no hepatomegaly, no splenomegaly EXTREMITIES: able to move all 4 extremities; ambulates with walker/cane PSYCHIATRIC: the patient is alert & oriented to person, affect & behavior appropriate  LABS/RADIOLOGY: 08/15/14  FE 72 TIBC 379 %sat 19 Ferritin 330 08/12/14  sodium 139 potassium 4.9 glucose 101 BUN 26 creatinine 1.6 calcium 9.7 08/09/14  WBC 9.6 hemoglobin 9.7 hematocrit 32.5 MCV 80.4 Labs reviewed: Basic Metabolic Panel:  Recent Labs  07/23/14 1513 08/05/14 0708 08/05/14 1828 08/06/14 0631  NA 143 141  --  137  K 4.7 4.3  --  4.1  CL 113* 106  --  102  CO2 24 21  --  24  GLUCOSE 65* 89  --  117*  BUN 24* 27*  --  20  CREATININE 1.93* 1.89* 1.83* 1.62*  CALCIUM 8.9 8.9  --  8.6   Liver Function Tests:  Recent Labs  07/23/14 1513  AST 20  ALT 15  ALKPHOS 54  BILITOT 1.5*  PROT 6.4  ALBUMIN 4.2   CBC:  Recent Labs  07/29/14 1126 08/05/14 1828 08/06/14 0631 08/07/14 0515  WBC 4.9 10.3 6.9 8.4  NEUTROABS 2.8  --   --   --   HGB 11.5* 10.2* 9.2* 8.6*  HCT 35.5* 31.6* 28.5* 26.1*  MCV 77.9* 76.5* 77.4* 74.1*  PLT 165 167 155 144*   Dg Chest 2 View  07/29/2014   CLINICAL DATA:  Preop, left hip replacement  EXAM: CHEST  2 VIEW  COMPARISON:  None.  FINDINGS: Cardiomediastinal  silhouette is unremarkable. No acute infiltrate or pleural effusion. No pulmonary edema. Mild degenerative changes thoracic spine.  IMPRESSION: No active cardiopulmonary disease.   Electronically Signed   By: Lahoma Crocker M.D.   On: 07/29/2014 13:05   Dg Pelvis Portable  08/05/2014   CLINICAL DATA:  postop  EXAM: PORTABLE PELVIS 1-2 VIEWS  COMPARISON:  10/02/2012 and earlier studies  FINDINGS: Interval left hip arthroplasty with femoral and acetabular components in expected location. Previous plate and sliding screw have been removed. The Previous of right hip arthroplasty components are stable in appearance, distal tip of the femoral stem not visualized. No fracture or dislocation. Exuberant ossification around the old right intertrochanteric fracture. Bilateral pelvic phleboliths.  IMPRESSION: 1. Interval left hip arthroplasty without fracture or other apparent complication.   Electronically Signed   By: Arne Cleveland M.D.   On: 08/05/2014 12:20    ASSESSMENT/PLAN:  Arthritis status post left total hip arthroplasty with hardware removal - for outpatient rehabilitation Bilateral hip pain - will follow-up with his primary doctor for pain management. Anemia, acute blood loss - stable; 10/15 hemoglobin 9.7 Hypertension - stable Hypertensive cardiomyopathy - stable TMJ disease - continue Pamelor when necessary Pruritus (new) - start Atarax 25 mg by mouth twice a day when necessary  I have filled out patient's discharge paperwork and written prescriptions.  Patient will have outpatient rehabilitation.  DME provided: Rolling walker  Total discharge time: Greater than 30 minutes  Discharge time involved coordination of the discharge process with Education officer, museum, nursing staff and therapy department. Medical justification for DME verified.  CPT CODE: 67209  MEDINA-VARGAS,Haydyn Liddell, [facility] [phone]

## 2014-08-27 ENCOUNTER — Other Ambulatory Visit: Payer: Self-pay | Admitting: *Deleted

## 2014-08-27 MED ORDER — OXYCODONE-ACETAMINOPHEN 7.5-325 MG PO TABS: ORAL_TABLET | ORAL | Status: DC

## 2014-08-27 MED ORDER — HYDROCODONE-ACETAMINOPHEN 7.5-325 MG PO TABS: 1.0000 | ORAL_TABLET | Freq: Four times a day (QID) | ORAL | Status: DC | PRN

## 2014-08-27 NOTE — Telephone Encounter (Signed)
[facility] 

## 2014-11-12 ENCOUNTER — Ambulatory Visit (INDEPENDENT_AMBULATORY_CARE_PROVIDER_SITE_OTHER): Payer: Medicare HMO | Admitting: Family Medicine

## 2014-11-12 ENCOUNTER — Encounter: Payer: Self-pay | Admitting: Family Medicine

## 2014-11-12 VITALS — BP 141/82 | HR 80 | Temp 97.5°F | Resp 20 | Wt 196.0 lb

## 2014-11-12 DIAGNOSIS — S39012A Strain of muscle, fascia and tendon of lower back, initial encounter: Secondary | ICD-10-CM | POA: Insufficient documentation

## 2014-11-12 MED ORDER — BACLOFEN 10 MG PO TABS: 10.0000 mg | ORAL_TABLET | Freq: Three times a day (TID) | ORAL | Status: DC

## 2014-11-12 MED ORDER — PREDNISONE 50 MG PO TABS: 50.0000 mg | ORAL_TABLET | Freq: Every day | ORAL | Status: DC

## 2014-11-12 NOTE — Patient Instructions (Addendum)
  Continue to use the heat and icy/hot ointment as prescribed. From the explanation of your injury and your exam today it sounds as if you strained your lower back. I have called you in a prednisone and new muscle relaxer.  F/u with your PCP in 2-4 weeks.

## 2014-11-12 NOTE — Assessment & Plan Note (Signed)
Likely lumbar strain. Patient has received a good deal pain medications for his orthopedic, and states he is currently out of medications. I'm unable to give him a Toradol injection today due to his elevated creatinine in the past. - I do not feel that is appropriate to refill his pain medications at this time. - Baclofen prescribed for muscle spasm, application of heat and icy hot. - Steroid burst. - Follow-up with PCP in 2-4 weeks.

## 2014-11-12 NOTE — Progress Notes (Signed)
   Subjective:    Patient ID: Paul [name], male    DOB: 1961/02/20, 54 y.o.   MRN: [id number]  HPI  Low back pain: Patient presents for low back pain that started on Sunday night (2 nights ago). Patient states he recently had a long train ride back from his family's home. He states he was doing some back stretches while lying in bed to help relieve his discomfort, and he found that he then could not sit up straight after. No popping or clicking. No trauma. No difficulty with bladder or bowels. He states he stayed in bed most of yesterday and applied heat and icy hot. He states he is out of his pain medications from his orthopedic doctor. Patient has known arthritis of the hip, hip replacement in 07/2014. He is able to walk with a cane.   Smoker  Past Medical History  Diagnosis Date  . Hypertensive cardiomyopathy   . Chronic kidney disease   . Atrial fibrillation     Isolated episode  . Hypertension     denies  . Dysrhythmia     atrial fib 11/13  . GERD (gastroesophageal reflux disease)     occ  . Arthritis   . TMJ arthralgia    Allergies  Allergen Reactions  . Ibuprofen Other (See Comments)    Kidney damage  . Nsaids     REACTION: nsaid induced nephropathy   Review of Systems Per HPI    Objective:   Physical Exam BP 141/82 mmHg  Pulse 80  Temp(Src) 97.5 F (36.4 C) (Oral)  Resp 20  Wt 196 lb (88.905 kg)  SpO2 100% Gen: AAM. NAD. Nontoxic in appearance. Appears in mild pain.  MSK: No erythema, no swelling, no bruising. Mild left lower lumbar paraspinal muscle spasm. No bony tenderness. Decreased flexion. Discomfort with side bending right. Negative straight leg raises. Exam limited to recent hip surgery.  Neurovascularly intact distally.  Neuro: walks with cane and limp (from hip surgery). PERLA. EOMi. Alert.    Assessment & Plan:

## 2014-11-15 ENCOUNTER — Other Ambulatory Visit: Payer: Self-pay | Admitting: Family Medicine

## 2014-11-18 ENCOUNTER — Other Ambulatory Visit: Payer: Self-pay | Admitting: Family Medicine

## 2014-11-18 NOTE — Telephone Encounter (Signed)
Patient requesting refill on zyrtec, has'nt used in a while but feels he needs it now.

## 2014-11-18 NOTE — Telephone Encounter (Signed)
Pt called and would like a refill on his allergy medication. jw

## 2014-11-19 ENCOUNTER — Other Ambulatory Visit: Payer: Self-pay | Admitting: *Deleted

## 2014-11-19 DIAGNOSIS — J4531 Mild persistent asthma with (acute) exacerbation: Secondary | ICD-10-CM

## 2014-11-19 DIAGNOSIS — J302 Other seasonal allergic rhinitis: Secondary | ICD-10-CM

## 2014-11-19 MED ORDER — CETIRIZINE HCL 10 MG PO TABS: 10.0000 mg | ORAL_TABLET | Freq: Every day | ORAL | Status: DC

## 2014-11-19 NOTE — Telephone Encounter (Signed)
Refill sent to pharmacy, please inform patient

## 2014-11-19 NOTE — Telephone Encounter (Signed)
Left message informing that rx had been sent in.

## 2014-11-20 MED ORDER — MONTELUKAST SODIUM 10 MG PO TABS: 10.0000 mg | ORAL_TABLET | Freq: Every day | ORAL | Status: DC

## 2014-11-20 MED ORDER — FLUTICASONE PROPIONATE 50 MCG/ACT NA SUSP: 1.0000 | Freq: Every day | NASAL | Status: DC

## 2014-11-25 ENCOUNTER — Encounter: Payer: Self-pay | Admitting: Family Medicine

## 2014-11-25 ENCOUNTER — Ambulatory Visit (INDEPENDENT_AMBULATORY_CARE_PROVIDER_SITE_OTHER): Payer: Medicare HMO | Admitting: Family Medicine

## 2014-11-25 VITALS — BP 169/88 | HR 72 | Temp 98.4°F | Ht 71.0 in | Wt 200.4 lb

## 2014-11-25 DIAGNOSIS — R040 Epistaxis: Secondary | ICD-10-CM

## 2014-11-25 DIAGNOSIS — F528 Other sexual dysfunction not due to a substance or known physiological condition: Secondary | ICD-10-CM

## 2014-11-25 DIAGNOSIS — S39012D Strain of muscle, fascia and tendon of lower back, subsequent encounter: Secondary | ICD-10-CM

## 2014-11-25 DIAGNOSIS — S39012A Strain of muscle, fascia and tendon of lower back, initial encounter: Secondary | ICD-10-CM

## 2014-11-25 MED ORDER — TADALAFIL 5 MG PO TABS
5.0000 mg | ORAL_TABLET | Freq: Every day | ORAL | Status: DC | PRN
Start: 2014-11-25 — End: 2015-03-20

## 2014-11-25 MED ORDER — OXYCODONE-ACETAMINOPHEN 7.5-325 MG PO TABS: 1.0000 | ORAL_TABLET | Freq: Four times a day (QID) | ORAL | Status: DC | PRN

## 2014-11-25 MED ORDER — PREDNISONE 50 MG PO TABS: 50.0000 mg | ORAL_TABLET | Freq: Every day | ORAL | Status: DC

## 2014-11-25 MED ORDER — BACLOFEN 10 MG PO TABS: 10.0000 mg | ORAL_TABLET | Freq: Three times a day (TID) | ORAL | Status: DC

## 2014-11-25 NOTE — Patient Instructions (Signed)
Low Back Sprain with Rehab  A sprain is an injury in which a ligament is torn. The ligaments of the lower back are vulnerable to sprains. However, they are strong and require great force to be injured. These ligaments are important for stabilizing the spinal column. Sprains are classified into three categories. Grade 1 sprains cause pain, but the tendon is not lengthened. Grade 2 sprains include a lengthened ligament, due to the ligament being stretched or partially ruptured. With grade 2 sprains there is still function, although the function may be decreased. Grade 3 sprains involve a complete tear of the tendon or muscle, and function is usually impaired. SYMPTOMS   Severe pain in the lower back.  Sometimes, a feeling of a "pop," "snap," or tear, at the time of injury.  Tenderness and sometimes swelling at the injury site.  Uncommonly, bruising (contusion) within 48 hours of injury.  Muscle spasms in the back. CAUSES  Low back sprains occur when a force is placed on the ligaments that is greater than they can handle. Common causes of injury include:  Performing a stressful act while off-balance.  Repetitive stressful activities that involve movement of the lower back.  Direct hit (trauma) to the lower back. RISK INCREASES WITH:  Contact sports (football, wrestling).  Collisions (major skiing accidents).  Sports that require throwing or lifting (baseball, weightlifting).  Sports involving twisting of the spine (gymnastics, diving, tennis, golf).  Poor strength and flexibility.  Inadequate protection.  Previous back injury or surgery (especially fusion). PREVENTION  Wear properly fitted and padded protective equipment.  Warm up and stretch properly before activity.  Allow for adequate recovery between workouts.  Maintain physical fitness:  Strength, flexibility, and endurance.  Cardiovascular fitness.  Maintain a healthy body weight. PROGNOSIS  If treated  properly, low back sprains usually heal with non-surgical treatment. The length of time for healing depends on the severity of the injury.  RELATED COMPLICATIONS   Recurring symptoms, resulting in a chronic problem.  Chronic inflammation and pain in the low back.  Delayed healing or resolution of symptoms, especially if activity is resumed too soon.  Prolonged impairment.  Unstable or arthritic joints of the low back. TREATMENT  Treatment first involves the use of ice and medicine, to reduce pain and inflammation. The use of strengthening and stretching exercises may help reduce pain with activity. These exercises may be performed at home or with a therapist. Severe injuries may require referral to a therapist for further evaluation and treatment, such as ultrasound. Your caregiver may advise that you wear a back brace or corset, to help reduce pain and discomfort. Often, prolonged bed rest results in greater harm then benefit. Corticosteroid injections may be recommended. However, these should be reserved for the most serious cases. It is important to avoid using your back when lifting objects. At night, sleep on your back on a firm mattress, with a pillow placed under your knees. If non-surgical treatment is unsuccessful, surgery may be needed.  MEDICATION   If pain medicine is needed, nonsteroidal anti-inflammatory medicines (aspirin and ibuprofen), or other minor pain relievers (acetaminophen), are often advised.  Do not take pain medicine for 7 days before surgery.  Prescription pain relievers may be given, if your caregiver thinks they are needed. Use only as directed and only as much as you need.  Ointments applied to the skin may be helpful.  Corticosteroid injections may be given by your caregiver. These injections should be reserved for the most serious cases,   because they may only be given a certain number of times. HEAT AND COLD  Cold treatment (icing) should be applied for 10  to 15 minutes every 2 to 3 hours for inflammation and pain, and immediately after activity that aggravates your symptoms. Use ice packs or an ice massage.  Heat treatment may be used before performing stretching and strengthening activities prescribed by your caregiver, physical therapist, or athletic trainer. Use a heat pack or a warm water soak. SEEK MEDICAL CARE IF:   Symptoms get worse or do not improve in 2 to 4 weeks, despite treatment.  You develop numbness or weakness in either leg.  You lose bowel or bladder function.  Any of the following occur after surgery: fever, increased pain, swelling, redness, drainage of fluids, or bleeding in the affected area.  New, unexplained symptoms develop. (Drugs used in treatment may produce side effects.) EXERCISES  RANGE OF MOTION (ROM) AND STRETCHING EXERCISES - Low Back Sprain Most people with lower back pain will find that their symptoms get worse with excessive bending forward (flexion) or arching at the lower back (extension). The exercises that will help resolve your symptoms will focus on the opposite motion.  Your physician, physical therapist or athletic trainer will help you determine which exercises will be most helpful to resolve your lower back pain. Do not complete any exercises without first consulting with your caregiver. Discontinue any exercises which make your symptoms worse, until you speak to your caregiver. If you have pain, numbness or tingling which travels down into your buttocks, leg or foot, the goal of the therapy is for these symptoms to move closer to your back and eventually resolve. Sometimes, these leg symptoms will get better, but your lower back pain may worsen. This is often an indication of progress in your rehabilitation. Be very alert to any changes in your symptoms and the activities in which you participated in the 24 hours prior to the change. Sharing this information with your caregiver will allow him or her to  most efficiently treat your condition. These exercises may help you when beginning to rehabilitate your injury. Your symptoms may resolve with or without further involvement from your physician, physical therapist or athletic trainer. While completing these exercises, remember:   Restoring tissue flexibility helps normal motion to return to the joints. This allows healthier, less painful movement and activity.  An effective stretch should be held for at least 30 seconds.  A stretch should never be painful. You should only feel a gentle lengthening or release in the stretched tissue. FLEXION RANGE OF MOTION AND STRETCHING EXERCISES: STRETCH - Flexion, Single Knee to Chest   Lie on a firm bed or floor with both legs extended in front of you.  Keeping one leg in contact with the floor, bring your opposite knee to your chest. Hold your leg in place by either grabbing behind your thigh or at your knee.  Pull until you feel a gentle stretch in your low back. Hold __________ seconds.  Slowly release your grasp and repeat the exercise with the opposite side. Repeat __________ times. Complete this exercise __________ times per day.  STRETCH - Flexion, Double Knee to Chest  Lie on a firm bed or floor with both legs extended in front of you.  Keeping one leg in contact with the floor, bring your opposite knee to your chest.  Tense your stomach muscles to support your back and then lift your other knee to your chest. Hold your legs   in place by either grabbing behind your thighs or at your knees.  Pull both knees toward your chest until you feel a gentle stretch in your low back. Hold __________ seconds.  Tense your stomach muscles and slowly return one leg at a time to the floor. Repeat __________ times. Complete this exercise __________ times per day.  STRETCH - Low Trunk Rotation  Lie on a firm bed or floor. Keeping your legs in front of you, bend your knees so they are both pointed toward the  ceiling and your feet are flat on the floor.  Extend your arms out to the side. This will stabilize your upper body by keeping your shoulders in contact with the floor.  Gently and slowly drop both knees together to one side until you feel a gentle stretch in your low back. Hold for __________ seconds.  Tense your stomach muscles to support your lower back as you bring your knees back to the starting position. Repeat the exercise to the other side. Repeat __________ times. Complete this exercise __________ times per day  EXTENSION RANGE OF MOTION AND FLEXIBILITY EXERCISES: STRETCH - Extension, Prone on Elbows   Lie on your stomach on the floor, a bed will be too soft. Place your palms about shoulder width apart and at the height of your head.  Place your elbows under your shoulders. If this is too painful, stack pillows under your chest.  Allow your body to relax so that your hips drop lower and make contact more completely with the floor.  Hold this position for __________ seconds.  Slowly return to lying flat on the floor. Repeat __________ times. Complete this exercise __________ times per day.  RANGE OF MOTION - Extension, Prone Press Ups  Lie on your stomach on the floor, a bed will be too soft. Place your palms about shoulder width apart and at the height of your head.  Keeping your back as relaxed as possible, slowly straighten your elbows while keeping your hips on the floor. You may adjust the placement of your hands to maximize your comfort. As you gain motion, your hands will come more underneath your shoulders.  Hold this position __________ seconds.  Slowly return to lying flat on the floor. Repeat __________ times. Complete this exercise __________ times per day.  RANGE OF MOTION- Quadruped, Neutral Spine   Assume a hands and knees position on a firm surface. Keep your hands under your shoulders and your knees under your hips. You may place padding under your knees for  comfort.  Drop your head and point your tailbone toward the ground below you. This will round out your lower back like an angry cat. Hold this position for __________ seconds.  Slowly lift your head and release your tail bone so that your back sags into a large arch, like an old horse.  Hold this position for __________ seconds.  Repeat this until you feel limber in your low back.  Now, find your "sweet spot." This will be the most comfortable position somewhere between the two previous positions. This is your neutral spine. Once you have found this position, tense your stomach muscles to support your low back.  Hold this position for __________ seconds. Repeat __________ times. Complete this exercise __________ times per day.  STRENGTHENING EXERCISES - Low Back Sprain These exercises may help you when beginning to rehabilitate your injury. These exercises should be done near your "sweet spot." This is the neutral, low-back arch, somewhere between fully rounded   and fully arched, that is your least painful position. When performed in this safe range of motion, these exercises can be used for people who have either a flexion or extension based injury. These exercises may resolve your symptoms with or without further involvement from your physician, physical therapist or athletic trainer. While completing these exercises, remember:   Muscles can gain both the endurance and the strength needed for everyday activities through controlled exercises.  Complete these exercises as instructed by your physician, physical therapist or athletic trainer. Increase the resistance and repetitions only as guided.  You may experience muscle soreness or fatigue, but the pain or discomfort you are trying to eliminate should never worsen during these exercises. If this pain does worsen, stop and make certain you are following the directions exactly. If the pain is still present after adjustments, discontinue the  exercise until you can discuss the trouble with your caregiver. STRENGTHENING - Deep Abdominals, Pelvic Tilt   Lie on a firm bed or floor. Keeping your legs in front of you, bend your knees so they are both pointed toward the ceiling and your feet are flat on the floor.  Tense your lower abdominal muscles to press your low back into the floor. This motion will rotate your pelvis so that your tail bone is scooping upwards rather than pointing at your feet or into the floor. With a gentle tension and even breathing, hold this position for __________ seconds. Repeat __________ times. Complete this exercise __________ times per day.  STRENGTHENING - Abdominals, Crunches   Lie on a firm bed or floor. Keeping your legs in front of you, bend your knees so they are both pointed toward the ceiling and your feet are flat on the floor. Cross your arms over your chest.  Slightly tip your chin down without bending your neck.  Tense your abdominals and slowly lift your trunk high enough to just clear your shoulder blades. Lifting higher can put excessive stress on the lower back and does not further strengthen your abdominal muscles.  Control your return to the starting position. Repeat __________ times. Complete this exercise __________ times per day.  STRENGTHENING - Quadruped, Opposite UE/LE Lift   Assume a hands and knees position on a firm surface. Keep your hands under your shoulders and your knees under your hips. You may place padding under your knees for comfort.  Find your neutral spine and gently tense your abdominal muscles so that you can maintain this position. Your shoulders and hips should form a rectangle that is parallel with the floor and is not twisted.  Keeping your trunk steady, lift your right hand no higher than your shoulder and then your left leg no higher than your hip. Make sure you are not holding your breath. Hold this position for __________ seconds.  Continuing to keep  your abdominal muscles tense and your back steady, slowly return to your starting position. Repeat with the opposite arm and leg. Repeat __________ times. Complete this exercise __________ times per day.  STRENGTHENING - Abdominals and Quadriceps, Straight Leg Raise   Lie on a firm bed or floor with both legs extended in front of you.  Keeping one leg in contact with the floor, bend the other knee so that your foot can rest flat on the floor.  Find your neutral spine, and tense your abdominal muscles to maintain your spinal position throughout the exercise.  Slowly lift your straight leg off the floor about 6 inches for a count   of 15, making sure to not hold your breath.  Still keeping your neutral spine, slowly lower your leg all the way to the floor. Repeat this exercise with each leg __________ times. Complete this exercise __________ times per day. POSTURE AND BODY MECHANICS CONSIDERATIONS - Low Back Sprain Keeping correct posture when sitting, standing or completing your activities will reduce the stress put on different body tissues, allowing injured tissues a chance to heal and limiting painful experiences. The following are general guidelines for improved posture. Your physician or physical therapist will provide you with any instructions specific to your needs. While reading these guidelines, remember:  The exercises prescribed by your provider will help you have the flexibility and strength to maintain correct postures.  The correct posture provides the best environment for your joints to work. All of your joints have less wear and tear when properly supported by a spine with good posture. This means you will experience a healthier, less painful body.  Correct posture must be practiced with all of your activities, especially prolonged sitting and standing. Correct posture is as important when doing repetitive low-stress activities (typing) as it is when doing a single heavy-load  activity (lifting). RESTING POSITIONS Consider which positions are most painful for you when choosing a resting position. If you have pain with flexion-based activities (sitting, bending, stooping, squatting), choose a position that allows you to rest in a less flexed posture. You would want to avoid curling into a fetal position on your side. If your pain worsens with extension-based activities (prolonged standing, working overhead), avoid resting in an extended position such as sleeping on your stomach. Most people will find more comfort when they rest with their spine in a more neutral position, neither too rounded nor too arched. Lying on a non-sagging bed on your side with a pillow between your knees, or on your back with a pillow under your knees will often provide some relief. Keep in mind, being in any one position for a prolonged period of time, no matter how correct your posture, can still lead to stiffness. PROPER SITTING POSTURE In order to minimize stress and discomfort on your spine, you must sit with correct posture. Sitting with good posture should be effortless for a healthy body. Returning to good posture is a gradual process. Many people can work toward this most comfortably by using various supports until they have the flexibility and strength to maintain this posture on their own. When sitting with proper posture, your ears will fall over your shoulders and your shoulders will fall over your hips. You should use the back of the chair to support your upper back. Your lower back will be in a neutral position, just slightly arched. You may place a small pillow or folded towel at the base of your lower back for  support.  When working at a desk, create an environment that supports good, upright posture. Without extra support, muscles tire, which leads to excessive strain on joints and other tissues. Keep these recommendations in mind: CHAIR:  A chair should be able to slide under your desk  when your back makes contact with the back of the chair. This allows you to work closely.  The chair's height should allow your eyes to be level with the upper part of your monitor and your hands to be slightly lower than your elbows. BODY POSITION  Your feet should make contact with the floor. If this is not possible, use a foot rest.  Keep your   ears over your shoulders. This will reduce stress on your neck and low back. INCORRECT SITTING POSTURES  If you are feeling tired and unable to assume a healthy sitting posture, do not slouch or slump. This puts excessive strain on your back tissues, causing more damage and pain. Healthier options include:  Using more support, like a lumbar pillow.  Switching tasks to something that requires you to be upright or walking.  Talking a brief walk.  Lying down to rest in a neutral-spine position. PROLONGED STANDING WHILE SLIGHTLY LEANING FORWARD  When completing a task that requires you to lean forward while standing in one place for a long time, place either foot up on a stationary 2-4 inch high object to help maintain the best posture. When both feet are on the ground, the lower back tends to lose its slight inward curve. If this curve flattens (or becomes too large), then the back and your other joints will experience too much stress, tire more quickly, and can cause pain. CORRECT STANDING POSTURES Proper standing posture should be assumed with all daily activities, even if they only take a few moments, like when brushing your teeth. As in sitting, your ears should fall over your shoulders and your shoulders should fall over your hips. You should keep a slight tension in your abdominal muscles to brace your spine. Your tailbone should point down to the ground, not behind your body, resulting in an over-extended swayback posture.  INCORRECT STANDING POSTURES  Common incorrect standing postures include a forward head, locked knees and/or an excessive  swayback. WALKING Walk with an upright posture. Your ears, shoulders and hips should all line-up. PROLONGED ACTIVITY IN A FLEXED POSITION When completing a task that requires you to bend forward at your waist or lean over a low surface, try to find a way to stabilize 3 out of 4 of your limbs. You can place a hand or elbow on your thigh or rest a knee on the surface you are reaching across. This will provide you more stability, so that your muscles do not tire as quickly. By keeping your knees relaxed, or slightly bent, you will also reduce stress across your lower back. CORRECT LIFTING TECHNIQUES DO :  Assume a wide stance. This will provide you more stability and the opportunity to get as close as possible to the object which you are lifting.  Tense your abdominals to brace your spine. Bend at the knees and hips. Keeping your back locked in a neutral-spine position, lift using your leg muscles. Lift with your legs, keeping your back straight.  Test the weight of unknown objects before attempting to lift them.  Try to keep your elbows locked down at your sides in order get the best strength from your shoulders when carrying an object.  Always ask for help when lifting heavy or awkward objects. INCORRECT LIFTING TECHNIQUES DO NOT:   Lock your knees when lifting, even if it is a small object.  Bend and twist. Pivot at your feet or move your feet when needing to change directions.  Assume that you can safely pick up even a paperclip without proper posture. Document Released: 10/25/2005 Document Revised: 01/17/2012 Document Reviewed: 02/06/2009 [employer] Patient Information 2015 [employer]. This information is not intended to replace advice given to you by your health care provider. Make sure you discuss any questions you have with your health care provider.  

## 2014-12-09 NOTE — Progress Notes (Signed)
   Subjective:    Patient ID: Paul [name], male    DOB: 06-21-1961, 54 y.o.   MRN: [id number]  HPI Pt presents for f/u of back pain. Reports pain initially improved with prednisone burst but returned after course ended. Baclofen and heat also helping. Gradual improvement.   Review of Systems  Constitutional: Negative for fever.  Musculoskeletal: Positive for back pain and arthralgias.  All other systems reviewed and are negative.      Objective:   Physical Exam  Constitutional: He is oriented to person, place, and time. He appears well-developed and well-nourished. No distress.  HENT:  Head: Normocephalic and atraumatic.  Eyes: Conjunctivae are normal. Right eye exhibits no discharge. Left eye exhibits no discharge. No scleral icterus.  Neck: Normal range of motion. Neck supple.  Cardiovascular: Normal rate and intact distal pulses.   Pulmonary/Chest: Effort normal.  Abdominal: He exhibits no distension.  Musculoskeletal: Normal range of motion. He exhibits tenderness. He exhibits no edema.  Lumbar tenderness and palpable spasm R>L. 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 He has normal reflexes.  Skin: Skin is warm and dry. He is not diaphoretic.  Psychiatric: He has a normal mood and affect. His behavior is normal.  Nursing note and vitals reviewed.         Assessment & Plan:

## 2014-12-09 NOTE — Assessment & Plan Note (Addendum)
Continued pain, relieved by prednisone and requesting more - will repeat steroid burst x2 weeks, none further, counseled patient about associated risks and need to limit use - continue baclofen and heat - gave rehab exercises - f/u if not improved in 1 month

## 2015-01-13 ENCOUNTER — Other Ambulatory Visit: Payer: Self-pay | Admitting: Family Medicine

## 2015-01-13 DIAGNOSIS — S39012A Strain of muscle, fascia and tendon of lower back, initial encounter: Secondary | ICD-10-CM

## 2015-01-13 DIAGNOSIS — S39012D Strain of muscle, fascia and tendon of lower back, subsequent encounter: Secondary | ICD-10-CM

## 2015-01-13 NOTE — Telephone Encounter (Signed)
Pt called and would like a refill on his Prednisone and Percocet. Please call when ready. jw

## 2015-03-04 ENCOUNTER — Ambulatory Visit (INDEPENDENT_AMBULATORY_CARE_PROVIDER_SITE_OTHER): Payer: Medicare HMO | Admitting: Family Medicine

## 2015-03-04 ENCOUNTER — Encounter: Payer: Self-pay | Admitting: Family Medicine

## 2015-03-04 VITALS — BP 137/88 | HR 73 | Temp 97.8°F | Ht 71.0 in | Wt 187.6 lb

## 2015-03-04 DIAGNOSIS — M25552 Pain in left hip: Secondary | ICD-10-CM

## 2015-03-04 DIAGNOSIS — S39012A Strain of muscle, fascia and tendon of lower back, initial encounter: Secondary | ICD-10-CM

## 2015-03-04 DIAGNOSIS — M25551 Pain in right hip: Secondary | ICD-10-CM | POA: Diagnosis not present

## 2015-03-04 DIAGNOSIS — S39012D Strain of muscle, fascia and tendon of lower back, subsequent encounter: Secondary | ICD-10-CM

## 2015-03-04 MED ORDER — PREDNISONE 50 MG PO TABS: 50.0000 mg | ORAL_TABLET | Freq: Every day | ORAL | Status: DC

## 2015-03-04 MED ORDER — OXYCODONE-ACETAMINOPHEN 7.5-325 MG PO TABS
1.0000 | ORAL_TABLET | Freq: Four times a day (QID) | ORAL | Status: DC | PRN
Start: 2015-03-04 — End: 2015-06-16

## 2015-03-04 NOTE — Assessment & Plan Note (Signed)
Recurrent pain, worsening with return to physical therapy for hips - will repeat steroid burst x2 weeks, counseled about risks and need to limit use - percocet refilled - counseled to restart baclofen given spasm on exam - continue PT and ortho f/u

## 2015-03-04 NOTE — Patient Instructions (Signed)
Mid-Back Strain with Rehab  A strain is an injury in which a tendon or muscle is torn. The muscles and tendons of the mid-back are vulnerable to strains. However, these muscles and tendons are very strong and require a great force to be injured. The muscles of the mid-back are responsible for stabilizing the spinal column, as well as spinal twisting (rotation). Strains are classified into three categories. Grade 1 strains cause pain, but the tendon is not lengthened. Grade 2 strains include a lengthened ligament, due to the ligament being stretched or partially ruptured. With grade 2 strains there is still function, although the function may be decreased. Grade 3 strains involve a complete tear of the tendon or muscle, and function is usually impaired. SYMPTOMS   Pain in the middle of the back.  Pain that may affect only one side, and is worse with movement.  Muscle spasms, and often swelling in the back.  Loss of strength of the back muscles.  Crackling sound (crepitation) when the muscles are touched. CAUSES  Mid-back strains occur when a force is placed on the muscles or tendons that is greater than they can handle. Common causes of injury include:  Ongoing overuse of the muscle-tendon units in the middle back, usually from incorrect body posture.  A single violent injury or force applied to the back. RISK INCREASES WITH:  Sports that involve twisting forces on the spine or a lot of bending at the waist (football, rugby, weightlifting, bowling, golf, tennis, speed skating, racquetball, swimming, running, gymnastics, diving).  Poor strength and flexibility.  Failure to warm up properly before activity.  Family history of low back pain or disk disorders.  Previous back injury or surgery (especially fusion). PREVENTION  Learn and use proper sports technique.  Warm up and stretch properly before activity.  Allow for adequate recovery between workouts.  Maintain physical  fitness:  Strength, flexibility, and endurance.  Cardiovascular fitness. PROGNOSIS  If treated properly, mid-back strains usually heal within 6 weeks. RELATED COMPLICATIONS   Frequently recurring symptoms, resulting in a chronic problem. Properly treating the problem the first time decreases frequency of recurrence.  Chronic inflammation, scarring, and partial muscle-tendon tear.  Delayed healing or resolution of symptoms, especially if activity is resumed too soon.  Prolonged disability. TREATMENT Treatment first involves the use of ice and medicine, to reduce pain and inflammation. As the pain begins to subside, you may begin strengthening and stretching exercises to improve body posture and sport technique. These exercises may be performed at home or with a therapist. Severe injuries may require referral to a therapist for further evaluation and treatment, such as ultrasound. Corticosteroid injections may be given to help reduce inflammation. Biofeedback (watching monitors of your body processes) and psychotherapy may also be prescribed. Prolonged bed rest is felt to do more harm than good. Massage may help break the muscle spasms. Sometimes, an injection of cortisone, with or without local anesthetics, may be given to help relieve the pain and spasms. MEDICATION   If pain medicine is needed, nonsteroidal anti-inflammatory medicines (aspirin and ibuprofen), or other minor pain relievers (acetaminophen), are often advised.  Do not take pain medicine for 7 days before surgery.  Prescription pain relievers may be given, if your caregiver thinks they are needed. Use only as directed and only as much as you need.  Ointments applied to the skin may be helpful.  Corticosteroid injections may be given by your caregiver. These injections should be reserved for the most serious cases, because  they may only be given a certain number of times. HEAT AND COLD:   Cold treatment (icing) should be  applied for 10 to 15 minutes every 2 to 3 hours for inflammation and pain, and immediately after activity that aggravates your symptoms. Use ice packs or an ice massage.  Heat treatment may be used before performing stretching and strengthening activities prescribed by your caregiver, physical therapist, or athletic trainer. Use a heat pack or a warm water soak. SEEK IMMEDIATE MEDICAL CARE IF:  Symptoms get worse or do not improve in 2 to 4 weeks, despite treatment.  You develop numbness, weakness, or loss of bowel or bladder function.  New, unexplained symptoms develop. (Drugs used in treatment may produce side effects.) EXERCISES RANGE OF MOTION (ROM) AND STRETCHING EXERCISES - Mid-Back Strain These exercises may help you when beginning to rehabilitate your injury. In order to successfully resolve your symptoms, you must improve your posture. These exercises are designed to help reduce the forward-head and rounded-shoulder posture which contributes to this condition. Your symptoms may resolve with or without further involvement from your physician, physical therapist or athletic trainer. While completing these exercises, remember:   Restoring tissue flexibility helps normal motion to return to the joints. This allows healthier, less painful movement and activity.  An effective stretch should be held for at least 30 seconds.  A stretch should never be painful. You should only feel a gentle lengthening or release in the stretched tissue. STRETCH - Axial Extension  Stand or sit on a firm surface. Assume a good posture: chest up, shoulders drawn back, stomach muscles slightly tense, knees unlocked (if standing) and feet hip width apart.  Slowly retract your chin, so your head slides back and your chin slightly lowers. Continue to look straight ahead.  You should feel a gentle stretch in the back of your head. Be certain not to feel an aggressive stretch since this can cause headaches  later.  Hold for __________ seconds. Repeat __________ times. Complete this exercise __________ times per day. RANGE OF MOTION- Upper Thoracic Extension  Sit on a firm chair with a high back. Assume a good posture: chest up, shoulders drawn back, abdominal muscles slightly tense, and feet hip width apart. Place a small pillow or folded towel in the curve of your lower back, if you are having difficulty maintaining good posture.  Gently brace your neck with your hands, allowing your arms to rest on your chest.  Continue to support your neck and slowly extend your back over the chair. You will feel a stretch across your upper back.  Hold __________ seconds. Slowly return to the starting position. Repeat __________ times. Complete this exercise __________ times per day. RANGE OF MOTION- Mid-Thoracic Extension  Roll a towel so that it is about 4 inches in diameter.  Position the towel lengthwise. Lay on the towel so that your spine, but not your shoulder blades, are supported.  You should feel your mid-back arching toward the floor. To increase the stretch, extend your arms away from your body.  Hold for __________ seconds. Repeat exercise __________ times, __________ times per day. STRENGTHENING EXERCISES - Mid-Back Strain These exercises may help you when beginning to rehabilitate your injury. They may resolve your symptoms with or without further involvement from your physician, physical therapist or athletic trainer. While completing these exercises, remember:   Muscles can gain both the endurance and the strength needed for everyday activities through controlled exercises.  Complete these exercises as instructed by  your physician, physical therapist or athletic trainer. Increase the resistance and repetitions only as guided by your caregiver.  You may experience muscle soreness or fatigue, but the pain or discomfort you are trying to eliminate should never worsen during these  exercises. If this pain does worsen, stop and make certain you are following the directions exactly. If the pain is still present after adjustments, discontinue the exercise until you can discuss the trouble with your caregiver. STRENGTHENING - Quadruped, Opposite UE/LE Lift  Assume a hands and knees position on a firm surface. Keep your hands under your shoulders and your knees under your hips. You may place padding under your knees for comfort.  Find your neutral spine and gently tense your abdominal muscles so that you can maintain this position. Your shoulders and hips should form a rectangle that is parallel with the floor and is not twisted.  Keeping your trunk steady, lift your right hand no higher than your shoulder and then your left leg no higher than your hip. Make sure you are not holding your breath. Hold this position __________ seconds.  Continuing to keep your abdominal muscles tense and your back steady, slowly return to your starting position. Repeat with the opposite arm and leg. Repeat __________ times. Complete this exercise __________ times per day.  STRENGTH - Shoulder Extensors  Secure a rubber exercise band or tubing to a fixed object (table, pole) so that it is at the height of your shoulders when you are either standing, or sitting on a firm armless chair.  With a thumbs-up grip, grasp an end of the band in each hand. Straighten your elbows and lift your hands straight in front of you at shoulder height. Step back away from the secured end of band, until it becomes tense.  Squeezing your shoulder blades together, pull your hands down to the sides of your thighs. Do not allow your hands to go behind you.  Hold for __________ seconds. Slowly ease the tension on the band, as you reverse the directions and return to the starting position. Repeat __________ times. Complete this exercise __________ times per day.  STRENGTH - Horizontal Abductors Choose one of the two  positions to complete this exercise. Prone: lying on stomach:  Lie on your stomach on a firm surface so that your right / left arm overhangs the edge. Rest your forehead on your opposite forearm. With your palm facing the floor and your elbow straight, hold a __________ weight in your hand.  Squeeze your right / left shoulder blade to your mid-back spine and then slowly raise your arm to the height of the bed.  Hold for __________ seconds. Slowly reverse the directions and return to the starting position, controlling the weight as you lower your arm. Repeat __________ times. Complete this exercise __________ times per day. Standing:   Secure a rubber exercise band or tubing, so that it is at the height of your shoulders when you are either standing, or sitting on a firm armless chair.  Grasp an end of the band in each hand and have your palms face each other. Straighten your elbows and lift your hands straight in front of you at shoulder height. Step back away from the secured end of band, until it becomes tense.  Squeeze your shoulder blades together. Keeping your elbows locked and your hands at shoulder height, spread your arms apart, forming a "T" shape with your body. Hold __________ seconds. Slowly ease the tension on the band, as  you reverse the directions and return to the starting position. Repeat __________ times. Complete this exercise __________ times per day. STRENGTH - Scapular Retractors and External Rotators, Rowing  Secure a rubber exercise band or tubing, so that it is at the height of your shoulders when you are either standing, or sitting on a firm armless chair.  With a palm-down grip, grasp an end of the band in each hand. Straighten your elbows and lift your hands straight in front of you at shoulder height. Step back away from the secured end of band, until it becomes tense.  Step 1: Squeeze your shoulder blades together. Bending your elbows, draw your hands to your  chest as if you are rowing a boat. At the end of this motion, your hands and elbow should be at shoulder height and your elbows should be out to your sides.  Step 2: Rotate your shoulder to raise your hands above your head. Your forearms should be vertical and your upper arms should be horizontal.  Hold for __________ seconds. Slowly ease the tension on the band, as you reverse the directions and return to the starting position. Repeat __________ times. Complete this exercise __________ times per day.  POSTURE AND BODY MECHANICS CONSIDERATIONS - Mid-Back Strain Keeping correct posture when sitting, standing or completing your activities will reduce the stress put on different body tissues, allowing injured tissues a chance to heal and limiting painful experiences. The following are general guidelines for improved posture. Your physician or physical therapist will provide you with any instructions specific to your needs. While reading these guidelines, remember:  The exercises prescribed by your provider will help you have the flexibility and strength to maintain correct postures.  The correct posture provides the best environment for your joints to work. All of your joints have less wear and tear when properly supported by a spine with good posture. This means you will experience a healthier, less painful body.  Correct posture must be practiced with all of your activities, especially prolonged sitting and standing. Correct posture is as important when doing repetitive low-stress activities (typing) as it is when doing a single heavy-load activity (lifting). PROPER SITTING POSTURE In order to minimize stress and discomfort on your spine, you must sit with correct posture. Sitting with good posture should be effortless for a healthy body. Returning to good posture is a gradual process. Many people can work toward this most comfortably by using various supports until they have the flexibility and  strength to maintain this posture on their own. When sitting with proper posture, your ears will fall over your shoulders and your shoulders will fall over your hips. You should use the back of the chair to support your upper back. Your lower back will be in a neutral position, just slightly arched. You may place a small pillow or folded towel at the base of your low back for  support.  When working at a desk, create an environment that supports good, upright posture. Without extra support, muscles fatigue and lead to excessive strain on joints and other tissues. Keep these recommendations in mind: CHAIR:  A chair should be able to slide under your desk when your back makes contact with the back of the chair. This allows you to work closely.  The chair's height should allow your eyes to be level with the upper part of your monitor and your hands to be slightly lower than your elbows. BODY POSITION  Your feet should make contact with  the floor. If this is not possible, use a foot rest.  Keep your ears over your shoulders. This will reduce stress on your neck and lower back. INCORRECT SITTING POSTURES If you are feeling tired and unable to assume a healthy sitting posture, do not slouch or slump. This puts excessive strain on your back tissues, causing more damage and pain. Healthier options include:  Using more support, like a lumbar pillow.  Switching tasks to something that requires you to be upright or walking.  Talking a brief walk.  Lying down to rest in a neutral-spine position. CORRECT STANDING POSTURES Proper standing posture should be assumed with all daily activities, even if they only take a few moments, like when brushing your teeth. As in sitting, your ears should fall over your shoulders and your shoulders should fall over your hips. You should keep a slight tension in your abdominal muscles to brace your spine. Your tailbone should point down to the ground, not behind your  body, resulting in an over-extended swayback posture.  INCORRECT STANDING POSTURES Common incorrect standing postures include a forward head, locked knees, and an excessive swayback. WALKING Walk with an upright posture. Your ears, shoulders and hips should all line-up. CORRECT LIFTING TECHNIQUES DO :   Assume a wide stance. This will provide you more stability and the opportunity to get as close as possible to the object which you are lifting.  Tense your abdominals to brace your spine. Bend at the knees and hips. Keeping your back locked in a neutral-spine position, lift using your leg muscles. Lift with your legs, keeping your back straight.  Test the weight of unknown objects before attempting to lift them.  Try to keep your elbows locked down at your sides in order get the best strength from your shoulders when carrying an object.  Always ask for help when lifting heavy or awkward objects. INCORRECT LIFTING TECHNIQUES DO NOT:   Lock your knees when lifting, even if it is a small object.  Bend and twist. Pivot at your feet or move your feet when needing to change directions.  Assume that you can safely pick up even a paperclip without proper posture. Document Released: 10/25/2005 Document Revised: 03/11/2014 Document Reviewed: 02/06/2009 [facility] Patient Information 2015 [place], [place]. This information is not intended to replace advice given to you by your health care provider. Make sure you discuss any questions you have with your health care provider.

## 2015-03-04 NOTE — Progress Notes (Signed)
   Subjective:    Patient ID: Paul [name], male    DOB: 1961/02/05, 54 y.o.   MRN: [id number]  HPI Pt presents for f/u of back and joint pains. He has a long history of orthapedic issues including SCFE, severe DJD and bilateral hip replacements. He is followed by Dr. Maureen Ralphs and Dr Mayer Camel and is undergoing physical therapy. He has been increasing his physical activity recently and would like to do more with physical therapy but feels that he is limited by pain. Things that have worked well for him in the past include prednisone and percocet. He is currently taking baclofen but feels that this sometimes makes his pain worse and causes diarrhea.    Review of Systems See HPI    Objective:   Physical Exam  Constitutional: He is oriented to person, place, and time. He appears well-developed and well-nourished. No distress.  HENT:  Head: Normocephalic and atraumatic.  Eyes: Conjunctivae are normal. Right eye exhibits no discharge. Left eye exhibits no discharge.  Cardiovascular: Normal rate.   Pulmonary/Chest: Effort normal. No respiratory distress.  Abdominal: Soft. He exhibits no distension.  Musculoskeletal:  Tenderness of paraspinal muscles, knee joint lines and hips. Palpable spasm of mid back and left biceps femoris  Neurological: He is alert and oriented to person, place, and time.  Skin: Skin is warm and dry. No rash noted. He is not diaphoretic.  Psychiatric: He has a normal mood and affect. His behavior is normal.  Nursing note and vitals reviewed.         Assessment & Plan:

## 2015-03-17 ENCOUNTER — Encounter (HOSPITAL_COMMUNITY): Payer: Self-pay | Admitting: Emergency Medicine

## 2015-03-17 ENCOUNTER — Inpatient Hospital Stay (HOSPITAL_COMMUNITY)
Admission: EM | Admit: 2015-03-17 | Discharge: 2015-03-20 | DRG: 246 | Disposition: A | Discharge status: Home / Self Care | Payer: Medicare HMO | Attending: Cardiology | Admitting: Cardiology

## 2015-03-17 ENCOUNTER — Emergency Department (INDEPENDENT_AMBULATORY_CARE_PROVIDER_SITE_OTHER)
Admission: EM | Admit: 2015-03-17 | Discharge: 2015-03-17 | Disposition: A | Discharge status: Home / Self Care | Payer: Medicare HMO | Source: Home / Self Care | Attending: Family Medicine | Admitting: Family Medicine

## 2015-03-17 ENCOUNTER — Emergency Department (INDEPENDENT_AMBULATORY_CARE_PROVIDER_SITE_OTHER): Payer: Medicare HMO

## 2015-03-17 DIAGNOSIS — I130 Hypertensive heart and chronic kidney disease with heart failure and stage 1 through stage 4 chronic kidney disease, or unspecified chronic kidney disease: Secondary | ICD-10-CM | POA: Diagnosis present

## 2015-03-17 DIAGNOSIS — N183 Chronic kidney disease, stage 3 (moderate): Secondary | ICD-10-CM | POA: Diagnosis present

## 2015-03-17 DIAGNOSIS — I2510 Atherosclerotic heart disease of native coronary artery without angina pectoris: Secondary | ICD-10-CM | POA: Diagnosis present

## 2015-03-17 DIAGNOSIS — I249 Acute ischemic heart disease, unspecified: Secondary | ICD-10-CM

## 2015-03-17 DIAGNOSIS — I509 Heart failure, unspecified: Secondary | ICD-10-CM

## 2015-03-17 DIAGNOSIS — R0602 Shortness of breath: Secondary | ICD-10-CM | POA: Diagnosis not present

## 2015-03-17 DIAGNOSIS — Z79899 Other long term (current) drug therapy: Secondary | ICD-10-CM

## 2015-03-17 DIAGNOSIS — I214 Non-ST elevation (NSTEMI) myocardial infarction: Secondary | ICD-10-CM | POA: Diagnosis not present

## 2015-03-17 DIAGNOSIS — I5033 Acute on chronic diastolic (congestive) heart failure: Secondary | ICD-10-CM | POA: Diagnosis present

## 2015-03-17 DIAGNOSIS — F17200 Nicotine dependence, unspecified, uncomplicated: Secondary | ICD-10-CM | POA: Diagnosis present

## 2015-03-17 DIAGNOSIS — K219 Gastro-esophageal reflux disease without esophagitis: Secondary | ICD-10-CM | POA: Diagnosis present

## 2015-03-17 DIAGNOSIS — I10 Essential (primary) hypertension: Secondary | ICD-10-CM

## 2015-03-17 DIAGNOSIS — Z23 Encounter for immunization: Secondary | ICD-10-CM

## 2015-03-17 DIAGNOSIS — D509 Iron deficiency anemia, unspecified: Secondary | ICD-10-CM | POA: Diagnosis present

## 2015-03-17 DIAGNOSIS — Z96649 Presence of unspecified artificial hip joint: Secondary | ICD-10-CM | POA: Diagnosis present

## 2015-03-17 DIAGNOSIS — Z886 Allergy status to analgesic agent status: Secondary | ICD-10-CM

## 2015-03-17 DIAGNOSIS — I498 Other specified cardiac arrhythmias: Secondary | ICD-10-CM

## 2015-03-17 DIAGNOSIS — Z7952 Long term (current) use of systemic steroids: Secondary | ICD-10-CM

## 2015-03-17 DIAGNOSIS — I480 Paroxysmal atrial fibrillation: Secondary | ICD-10-CM | POA: Diagnosis present

## 2015-03-17 DIAGNOSIS — J189 Pneumonia, unspecified organism: Secondary | ICD-10-CM

## 2015-03-17 DIAGNOSIS — I421 Obstructive hypertrophic cardiomyopathy: Secondary | ICD-10-CM | POA: Diagnosis present

## 2015-03-17 DIAGNOSIS — E785 Hyperlipidemia, unspecified: Secondary | ICD-10-CM | POA: Diagnosis present

## 2015-03-17 DIAGNOSIS — Z955 Presence of coronary angioplasty implant and graft: Secondary | ICD-10-CM

## 2015-03-17 DIAGNOSIS — I5031 Acute diastolic (congestive) heart failure: Secondary | ICD-10-CM

## 2015-03-17 DIAGNOSIS — R739 Hyperglycemia, unspecified: Secondary | ICD-10-CM | POA: Diagnosis present

## 2015-03-17 DIAGNOSIS — D72829 Elevated white blood cell count, unspecified: Secondary | ICD-10-CM

## 2015-03-17 DIAGNOSIS — Z7951 Long term (current) use of inhaled steroids: Secondary | ICD-10-CM

## 2015-03-17 DIAGNOSIS — F17210 Nicotine dependence, cigarettes, uncomplicated: Secondary | ICD-10-CM | POA: Diagnosis present

## 2015-03-17 DIAGNOSIS — I700 Atherosclerosis of aorta: Secondary | ICD-10-CM | POA: Diagnosis present

## 2015-03-17 HISTORY — DX: Obstructive hypertrophic cardiomyopathy: I42.1

## 2015-03-17 HISTORY — DX: Atherosclerotic heart disease of native coronary artery without angina pectoris: I25.10

## 2015-03-17 HISTORY — DX: Chronic kidney disease, stage 3 unspecified: N18.30

## 2015-03-17 HISTORY — DX: Paroxysmal atrial fibrillation: I48.0

## 2015-03-17 HISTORY — DX: Chronic kidney disease, stage 3 (moderate): N18.3

## 2015-03-17 LAB — CBC WITH DIFFERENTIAL/PLATELET
Basophils Absolute: 0 10*3/uL (ref 0.0–0.1)
Basophils Relative: 0 % (ref 0–1)
EOS ABS: 0.1 10*3/uL (ref 0.0–0.7)
Eosinophils Relative: 1 % (ref 0–5)
HCT: 36.9 % — ABNORMAL LOW (ref 39.0–52.0)
Hemoglobin: 11.9 g/dL — ABNORMAL LOW (ref 13.0–17.0)
LYMPHS ABS: 1.7 10*3/uL (ref 0.7–4.0)
Lymphocytes Relative: 16 % (ref 12–46)
MCH: 25.3 pg — AB (ref 26.0–34.0)
MCHC: 32.2 g/dL (ref 30.0–36.0)
MCV: 78.5 fL (ref 78.0–100.0)
MONOS PCT: 8 % (ref 3–12)
Monocytes Absolute: 0.9 10*3/uL (ref 0.1–1.0)
Neutro Abs: 8.4 10*3/uL — ABNORMAL HIGH (ref 1.7–7.7)
Neutrophils Relative %: 75 % (ref 43–77)
Platelets: 177 10*3/uL (ref 150–400)
RBC: 4.7 MIL/uL (ref 4.22–5.81)
RDW: 14.5 % (ref 11.5–15.5)
WBC: 11 10*3/uL — ABNORMAL HIGH (ref 4.0–10.5)

## 2015-03-17 LAB — BRAIN NATRIURETIC PEPTIDE: B NATRIURETIC PEPTIDE 5: 1269.3 pg/mL — AB (ref 0.0–100.0)

## 2015-03-17 LAB — COMPREHENSIVE METABOLIC PANEL
ALK PHOS: 62 U/L (ref 38–126)
ALT: 41 U/L (ref 17–63)
ANION GAP: 11 (ref 5–15)
AST: 39 U/L (ref 15–41)
Albumin: 3.7 g/dL (ref 3.5–5.0)
BUN: 14 mg/dL (ref 6–20)
CALCIUM: 9.2 mg/dL (ref 8.9–10.3)
CO2: 24 mmol/L (ref 22–32)
Chloride: 108 mmol/L (ref 101–111)
Creatinine, Ser: 1.63 mg/dL — ABNORMAL HIGH (ref 0.61–1.24)
GFR calc Af Amer: 53 mL/min — ABNORMAL LOW (ref >60)
GFR, EST NON AFRICAN AMERICAN: 46 mL/min — AB (ref >60)
Glucose, Bld: 103 mg/dL — ABNORMAL HIGH (ref 70–99)
POTASSIUM: 3.8 mmol/L (ref 3.5–5.1)
SODIUM: 143 mmol/L (ref 135–145)
Total Bilirubin: 2.4 mg/dL — ABNORMAL HIGH (ref 0.3–1.2)
Total Protein: 6.6 g/dL (ref 6.5–8.1)

## 2015-03-17 LAB — TROPONIN I: Troponin I: 10.33 ng/mL (ref <0.031)

## 2015-03-17 MED ORDER — HEPARIN (PORCINE) IN NACL 100-0.45 UNIT/ML-% IJ SOLN
1200.0000 [IU]/h | INTRAMUSCULAR | Status: DC
  Administered 2015-03-18: 1200 [IU]/h via INTRAVENOUS
  Filled 2015-03-17 (×2): qty 250

## 2015-03-17 MED ORDER — IPRATROPIUM BROMIDE 0.02 % IN SOLN
0.5000 mg | Freq: Once | RESPIRATORY_TRACT | Status: AC
  Administered 2015-03-17: 0.5 mg via RESPIRATORY_TRACT

## 2015-03-17 MED ORDER — METHYLPREDNISOLONE SODIUM SUCC 125 MG IJ SOLR
125.0000 mg | Freq: Once | INTRAMUSCULAR | Status: AC
  Administered 2015-03-17: 125 mg via INTRAVENOUS
  Filled 2015-03-17: qty 2

## 2015-03-17 MED ORDER — HEPARIN BOLUS VIA INFUSION
4000.0000 [IU] | Freq: Once | INTRAVENOUS | Status: AC
  Administered 2015-03-18: 4000 [IU] via INTRAVENOUS
  Filled 2015-03-17: qty 4000

## 2015-03-17 MED ORDER — ALBUTEROL SULFATE (2.5 MG/3ML) 0.083% IN NEBU
2.5000 mg | INHALATION_SOLUTION | Freq: Once | RESPIRATORY_TRACT | Status: AC
  Administered 2015-03-17: 2.5 mg via RESPIRATORY_TRACT

## 2015-03-17 MED ORDER — DIPHENHYDRAMINE HCL 50 MG/ML IJ SOLN
25.0000 mg | Freq: Once | INTRAMUSCULAR | Status: AC
  Administered 2015-03-17: 25 mg via INTRAVENOUS
  Filled 2015-03-17: qty 1

## 2015-03-17 MED ORDER — METOCLOPRAMIDE HCL 5 MG/ML IJ SOLN
10.0000 mg | INTRAMUSCULAR | Status: AC
  Administered 2015-03-17: 10 mg via INTRAVENOUS
  Filled 2015-03-17: qty 2

## 2015-03-17 MED ORDER — ALBUTEROL SULFATE (2.5 MG/3ML) 0.083% IN NEBU
INHALATION_SOLUTION | RESPIRATORY_TRACT | Status: AC
  Filled 2015-03-17: qty 3

## 2015-03-17 MED ORDER — IPRATROPIUM BROMIDE 0.02 % IN SOLN
RESPIRATORY_TRACT | Status: AC
  Filled 2015-03-17: qty 2.5

## 2015-03-17 MED ORDER — DIPHENHYDRAMINE HCL 50 MG/ML IJ SOLN
INTRAMUSCULAR | Status: AC
  Filled 2015-03-17: qty 1

## 2015-03-17 MED ORDER — SODIUM CHLORIDE 0.9 % IV BOLUS (SEPSIS)
1000.0000 mL | Freq: Once | INTRAVENOUS | Status: AC
  Administered 2015-03-17: 1000 mL via INTRAVENOUS

## 2015-03-17 MED ORDER — FAMOTIDINE IN NACL 20-0.9 MG/50ML-% IV SOLN
20.0000 mg | Freq: Once | INTRAVENOUS | Status: AC
  Administered 2015-03-17: 20 mg via INTRAVENOUS
  Filled 2015-03-17: qty 50

## 2015-03-17 NOTE — Progress Notes (Signed)
ANTICOAGULATION CONSULT NOTE - Initial Consult  Pharmacy Consult for Heparin  Indication: ACS/  Allergies  Allergen Reactions  . Ibuprofen Other (See Comments)    Kidney damage  . Nsaids     REACTION: nsaid induced nephropathy    Patient Measurements: Height: 5' 11.5" (181.6 cm) Weight: 185 lb (83.915 kg) IBW/kg (Calculated) : 76.45 Heparin Dosing Weight: 83.9 kg  Vital Signs: Temp: 99 F (37.2 C) (05/09 1938) Temp Source: Oral (05/09 1938) BP: 147/120 mmHg (05/09 2245) Pulse Rate: 55 (05/09 2245)  Labs:  Recent Labs  03/17/15 1933 03/17/15 1953  HGB  --  11.9*  HCT  --  36.9*  PLT  --  177  CREATININE  --  1.63*  TROPONINI 10.33*  --     Estimated Creatinine Clearance: 54.8 mL/min (by C-G formula based on Cr of 1.63).   Medical History: Past Medical History  Diagnosis Date  . Hypertensive cardiomyopathy   . Chronic kidney disease   . Atrial fibrillation     Isolated episode  . Hypertension     denies  . Dysrhythmia     atrial fib 11/13  . GERD (gastroesophageal reflux disease)     occ  . Arthritis   . TMJ arthralgia     Assessment: 54 year old male with a history of paroxysmal atrial fibrillation, hypertension, esophageal reflux, and hypertensive cardiomyopathy. He presents to the emergency department from urgent care for further evaluation of shortness of breath.  receiving a DuoNeb in Urgent Care. He has also described some indigestion after eating for which he has taken Pepto-Bismol. Critical troponin received from lab-- 10.33 Pharmacy consulted to dose heparin for ACS/STEMI Pt not on anticoagulation PTA.  Hgb low 11.9, PLTC 177K  Goal of Therapy:  Heparin level 0.3-0.7 units/ml Monitor platelets by anticoagulation protocol: Yes   Plan:  Heparin 4000 units IV bolus x1 Heparin drip 1200 units/hr 6hr heparin level Daily heparin level and CBC  Nicole Cella, RPh Clinical Pharmacist Pager: [phone] 03/17/2015,11:15 PM

## 2015-03-17 NOTE — ED Notes (Signed)
Critical troponin received from lab-- 10.33-- EDP aware,

## 2015-03-17 NOTE — ED Notes (Signed)
Pt. transferred from [facility] , x-ray result shows pneumonia , reports SOB / exertional dyspnea  , chest congestion and productive cough onset this week . Denies fever or chills.

## 2015-03-17 NOTE — ED Notes (Signed)
Called and gave report to Abigail Butts at [facility].

## 2015-03-17 NOTE — ED Notes (Signed)
Transferred by shuttle

## 2015-03-17 NOTE — ED Provider Notes (Signed)
CSN: [id number]     Arrival date & time 03/17/15  1900 History   First MD Initiated Contact with Patient 03/17/15 2130     Chief Complaint  Patient presents with  . Pneumonia    (Consider location/radiation/quality/duration/timing/severity/associated sxs/prior Treatment) HPI Comments: Patient is a 54 year old male with a history of paroxysmal atrial fibrillation, hypertension, esophageal reflux, and hypertensive cardiomyopathy. He presents to the emergency department from urgent care for further evaluation of shortness of breath. Patient reports progressive SOB over the past few days. He states that pain is worse when supine and constant. He has had some improvement in his SOB since receiving a DuoNeb in Urgent Care. He has also described some indigestion after eating for which he has taken Pepto-Bismol. Patient reports some associated chest tightness with activity. Patient states that this is not unusual for him after the winter months when he restarts outdoor yard work/lawn mowing. He states he has had a cough x 2 weeks. Patient cannot recall any sick contacts. He denies the use of blood thinners. He is not on a daily aspirin regimen. No reported fever, syncope, nausea, or vomiting.   Patient with LVEF of 65-70% in 2013. He is not seen by cardiology at present.  PCP - Dr. Sherril Cong  Patient is a 54 y.o. male presenting with pneumonia. The history is provided by the patient. No language interpreter was used.  Pneumonia Associated symptoms include chest pain and coughing. Pertinent negatives include no fever, nausea or vomiting.    Past Medical History  Diagnosis Date  . Hypertensive cardiomyopathy   . Chronic kidney disease   . Atrial fibrillation     Isolated episode  . Hypertension     denies  . Dysrhythmia     atrial fib 11/13  . GERD (gastroesophageal reflux disease)     occ  . Arthritis   . TMJ arthralgia    Past Surgical History  Procedure Laterality Date  . Hip surgery Left 72     lft   . Total hip arthroplasty Right 03,01    x2  . Appendectomy    . Laparoscopy  09/22/2012    Procedure: LAPAROSCOPY DIAGNOSTIC;  Surgeon: Harl Bowie, MD;  Location: [facility];  Service: General;  Laterality: N/A;  . Laparoscopic appendectomy  09/22/2012    Procedure: APPENDECTOMY LAPAROSCOPIC;  Surgeon: Harl Bowie, MD;  Location: [facility];  Service: General;;  . Total hip arthroplasty with hardware removal Left 08/05/2014    Procedure: TOTAL HIP ARTHROPLASTY WITH HARDWARE REMOVAL;  Surgeon: Kerin Salen, MD;  Location: [facility];  Service: Orthopedics;  Laterality: Left;   No family history on file. History  Substance Use Topics  . Smoking status: Light Tobacco Smoker -- 0.25 packs/day for 10 years    Types: Cigarettes  . Smokeless tobacco: Never Used  . Alcohol Use: 1.2 oz/week    2 Cans of beer per week     Comment: occasional    Review of Systems  Constitutional: Negative for fever.  Respiratory: Positive for cough, chest tightness and shortness of breath.   Cardiovascular: Positive for chest pain.  Gastrointestinal: Negative for nausea and vomiting.  Neurological: Negative for syncope.  All other systems reviewed and are negative.   Allergies  Ibuprofen and Nsaids  Home Medications   Prior to Admission medications   Medication Sig Start Date End Date Taking? Authorizing Provider  baclofen (LIORESAL) 10 MG tablet Take 1 tablet (10 mg total) by mouth 3 (three) times daily. 11/25/14  Yes Frazier Richards, MD  cetirizine (ZYRTEC) 10 MG tablet Take 1 tablet (10 mg total) by mouth daily. 11/19/14  Yes Frazier Richards, MD  fluticasone (FLONASE) 50 MCG/ACT nasal spray Place 1 spray into both nostrils daily. 11/20/14  Yes Frazier Richards, MD  montelukast (SINGULAIR) 10 MG tablet Take 1 tablet (10 mg total) by mouth at bedtime. 11/20/14  Yes Frazier Richards, MD  oxyCODONE-acetaminophen (PERCOCET) 7.5-325 MG per tablet Take 1 tablet by mouth every 6 (six) hours as needed for  severe pain. 03/04/15  Yes Frazier Richards, MD  predniSONE (DELTASONE) 50 MG tablet Take 1 tablet (50 mg total) by mouth daily with breakfast. 03/04/15  Yes Frazier Richards, MD  tadalafil (CIALIS) 5 MG tablet Take 1 tablet (5 mg total) by mouth daily as needed for erectile dysfunction. 11/25/14  Yes Frazier Richards, MD  enoxaparin (LOVENOX) 40 MG/0.4ML injection Inject 0.4 mLs (40 mg total) into the skin daily. Patient not taking: Reported on 03/17/2015 08/05/14   Leighton Parody, PA-C  HYDROcodone-acetaminophen [facility]) 7.5-325 MG per tablet Take 1 tablet by mouth every 6 (six) hours as needed for moderate pain. Patient not taking: Reported on 03/17/2015 08/27/14   Estill Dooms, MD  LORazepam (ATIVAN) 1 MG tablet Take one tablet by mouth every 6 hours as needed for signs/symptoms of withdrawals Patient not taking: Reported on 03/17/2015 08/21/14   Blanchie Serve, MD  methocarbamol (ROBAXIN) 500 MG tablet Take 1 tablet (500 mg total) by mouth 2 (two) times daily with a meal. Patient not taking: Reported on 03/17/2015 08/05/14   Leighton Parody, PA-C  nortriptyline (PAMELOR) 25 MG capsule Take 1 capsule (25 mg total) by mouth 2 (two) times daily as needed (for jaw pain). Patient not taking: Reported on 03/17/2015 07/23/14   Frazier Richards, MD   BP 147/120 mmHg  Pulse 55  Temp(Src) 99 F (37.2 C) (Oral)  Resp 27  Ht 5' 11.5" (1.816 m)  Wt 185 lb (83.915 kg)  BMI 25.45 kg/m2  SpO2 91%   Physical Exam  Constitutional: He is oriented to person, place, and time. He appears well-developed and well-nourished. No distress.  Nontoxic/nonseptic appearing; pleasant  HENT:  Head: Normocephalic and atraumatic.  Eyes: Conjunctivae and EOM are normal.  Neck: Normal range of motion.  Cardiovascular: Normal rate.  An irregularly irregular rhythm present.  Pulmonary/Chest: Effort normal. No respiratory distress. He has no wheezes. He has no rales.  Respirations even and unlabored. No wheezes noted.  Musculoskeletal: Normal  range of motion.  Neurological: He is alert and oriented to person, place, and time. He exhibits normal muscle tone. Coordination normal.  GCS 15. Speech is goal oriented. Patient moves extremities without ataxia.  Skin: Skin is warm and dry. No rash noted. He is not diaphoretic. No erythema. No pallor.  Psychiatric: He has a normal mood and affect. His behavior is normal.  Nursing note and vitals reviewed.   ED Course  Procedures (including critical care time) Labs Review Labs Reviewed  CBC WITH DIFFERENTIAL/PLATELET - Abnormal; Notable for the following:    WBC 11.0 (*)    Hemoglobin 11.9 (*)    HCT 36.9 (*)    MCH 25.3 (*)    Neutro Abs 8.4 (*)    All other components within normal limits  COMPREHENSIVE METABOLIC PANEL - Abnormal; Notable for the following:    Glucose, Bld 103 (*)    Creatinine, Ser 1.63 (*)    Total Bilirubin 2.4 (*)  GFR calc non Af Amer 46 (*)    GFR calc Af Amer 53 (*)    All other components within normal limits  TROPONIN I - Abnormal; Notable for the following:    Troponin I 10.33 (*)    All other components within normal limits  BRAIN NATRIURETIC PEPTIDE - Abnormal; Notable for the following:    B Natriuretic Peptide 1269.3 (*)    All other components within normal limits  HEPARIN LEVEL (UNFRACTIONATED)  CBC    Imaging Review Dg Chest 2 View  03/17/2015   CLINICAL DATA:  Wheezing and shortness of breath for the past 2 days. Smoker.  EXAM: CHEST  2 VIEW  COMPARISON:  07/29/2014.  FINDINGS: Stable enlarged cardiac silhouette and tortuous aorta. Interval patchy airspace opacity in both lungs. No pleural fluid seen. Lower thoracic spine degenerative changes.  IMPRESSION: 1. Interval patchy pneumonia in both lungs. Alveolar edema is less likely. 2. Stable cardiomegaly.   Electronically Signed   By: Claudie Revering M.D.   On: 03/17/2015 18:11     EKG Interpretation   Date/Time:  Monday Mar 17 2015 19:44:26 EDT Ventricular Rate:  78 PR Interval:   124 QRS Duration: 90 QT Interval:  412 QTC Calculation: 469 R Axis:   16 Text Interpretation:  Sinus rhythm with Premature ventricular complexes or  Fusion complexes Possible Left atrial enlargement Left ventricular  hypertrophy Anteroseptal infarct , age undetermined ST \\T \ T wave  abnormality, consider lateral ischemia Abnormal ECG Confirmed by ZACKOWSKI   MD, SCOTT [phone]) on 03/17/2015 10:02:34 PM       CRITICAL CARE Performed by: Antonietta Breach   Total critical care time: 35  Critical care time was exclusive of separately billable procedures and treating other patients.  Critical care was necessary to treat or prevent imminent or life-threatening deterioration.  Critical care was time spent personally by me on the following activities: development of treatment plan with patient and/or surrogate as well as nursing, discussions with consultants, evaluation of patient's response to treatment, examination of patient, obtaining history from patient or surrogate, ordering and performing treatments and interventions, ordering and review of laboratory studies, ordering and review of radiographic studies, pulse oximetry and re-evaluation of patient's condition.  MDM   Final diagnoses:  ACS (acute coronary syndrome)    54 year old male presents to the emergency department from urgent care for further evaluation of shortness of breath. Patient initially thought to have pneumonia per results of chest x-ray; however, symptoms are most consistent with ACS especially in light of his significantly elevated troponin of 10.3. EKG shows some new lateral depression. Patient is not complaining of symptoms at present, such as chest pain or tightness. He does report some shortness of breath and supine, but complains very little of this when upright. He has a history of paroxysmal atrial fibrillation, but has not appeared to be in atrial fibrillation during his encounter in the emergency department. Case  discussed with Dr. Ulyses Amor of cardiology who will evaluate the patient for admission.   Filed Vitals:   03/17/15 2200 03/17/15 2215 03/17/15 2230 03/17/15 2245  BP: 176/91 162/80 179/87 147/120  Pulse: 53 56 68 55  Temp:      TempSrc:      Resp:  23  27  Height:      Weight:      SpO2: 95% 91% 93% 91%    Antonietta Breach, PA-C 03/18/15 0001  Antonietta Breach, PA-C 03/18/15 0041

## 2015-03-17 NOTE — ED Provider Notes (Signed)
CSN: [id number]     Arrival date & time 03/17/15  1628 History   First MD Initiated Contact with Patient 03/17/15 1719     Chief Complaint  Patient presents with  . Shortness of Breath  . Abdominal Pain   (Consider location/radiation/quality/duration/timing/severity/associated sxs/prior Treatment) Patient is a 54 y.o. male presenting with shortness of breath and abdominal pain. The history is provided by the patient. No language interpreter was used.  Shortness of Breath Severity:  Moderate Onset quality:  Gradual Timing:  Constant Progression:  Worsening Chronicity:  New Context: URI   Relieved by:  Nothing Worsened by:  Nothing tried Associated symptoms: abdominal pain and cough   Abdominal Pain Associated symptoms: cough and shortness of breath   Pt reports he has been short of breath for the past 2 days.  Pt has had a cough for 2 weeks.  Past Medical History  Diagnosis Date  . Hypertensive cardiomyopathy   . Chronic kidney disease   . Atrial fibrillation     Isolated episode  . Hypertension     denies  . Dysrhythmia     atrial fib 11/13  . GERD (gastroesophageal reflux disease)     occ  . Arthritis   . TMJ arthralgia    Past Surgical History  Procedure Laterality Date  . Hip surgery Left 72    lft   . Total hip arthroplasty Right 03,01    x2  . Appendectomy    . Laparoscopy  09/22/2012    Procedure: LAPAROSCOPY DIAGNOSTIC;  Surgeon: Harl Bowie, MD;  Location: [facility];  Service: General;  Laterality: N/A;  . Laparoscopic appendectomy  09/22/2012    Procedure: APPENDECTOMY LAPAROSCOPIC;  Surgeon: Harl Bowie, MD;  Location: [facility];  Service: General;;  . Total hip arthroplasty with hardware removal Left 08/05/2014    Procedure: TOTAL HIP ARTHROPLASTY WITH HARDWARE REMOVAL;  Surgeon: Kerin Salen, MD;  Location: [facility];  Service: Orthopedics;  Laterality: Left;   No family history on file. History  Substance Use Topics  . Smoking status: Light  Tobacco Smoker -- 0.25 packs/day for 10 years    Types: Cigarettes  . Smokeless tobacco: Never Used  . Alcohol Use: 1.2 oz/week    2 Cans of beer per week     Comment: occasional    Review of Systems  Respiratory: Positive for cough and shortness of breath.   Gastrointestinal: Positive for abdominal pain.  All other systems reviewed and are negative.   Allergies  Ibuprofen and Nsaids  Home Medications   Prior to Admission medications   Medication Sig Start Date End Date Taking? Authorizing Provider  baclofen (LIORESAL) 10 MG tablet Take 1 tablet (10 mg total) by mouth 3 (three) times daily. 11/25/14   Frazier Richards, MD  cetirizine (ZYRTEC) 10 MG tablet Take 1 tablet (10 mg total) by mouth daily. 11/19/14   Frazier Richards, MD  enoxaparin (LOVENOX) 40 MG/0.4ML injection Inject 0.4 mLs (40 mg total) into the skin daily. 08/05/14   Leighton Parody, PA-C  fluticasone (FLONASE) 50 MCG/ACT nasal spray Place 1 spray into both nostrils daily. 11/20/14   Frazier Richards, MD  HYDROcodone-acetaminophen (NORCO) 7.5-325 MG per tablet Take 1 tablet by mouth every 6 (six) hours as needed for moderate pain. 08/27/14   Estill Dooms, MD  LORazepam (ATIVAN) 1 MG tablet Take one tablet by mouth every 6 hours as needed for signs/symptoms of withdrawals 08/21/14   Blanchie Serve, MD  methocarbamol (ROBAXIN) 500 MG tablet Take 1 tablet (500 mg total) by mouth 2 (two) times daily with a meal. 08/05/14   Leighton Parody, PA-C  montelukast (SINGULAIR) 10 MG tablet Take 1 tablet (10 mg total) by mouth at bedtime. 11/20/14   Frazier Richards, MD  nortriptyline (PAMELOR) 25 MG capsule Take 1 capsule (25 mg total) by mouth 2 (two) times daily as needed (for jaw pain). 07/23/14   Frazier Richards, MD  oxyCODONE-acetaminophen (PERCOCET) 7.5-325 MG per tablet Take 1 tablet by mouth every 6 (six) hours as needed for severe pain. 03/04/15   Frazier Richards, MD  predniSONE (DELTASONE) 50 MG tablet Take 1 tablet (50 mg total) by mouth daily  with breakfast. 03/04/15   Frazier Richards, MD  tadalafil (CIALIS) 5 MG tablet Take 1 tablet (5 mg total) by mouth daily as needed for erectile dysfunction. 11/25/14   Frazier Richards, MD   BP 199/97 mmHg  Pulse 79  Temp(Src) 98.5 F (36.9 C) (Oral)  Resp 24  SpO2 91% Physical Exam  Constitutional: He is oriented to person, place, and time. He appears well-developed and well-nourished.  HENT:  Head: Normocephalic and atraumatic.  Eyes: EOM are normal.  Neck: Normal range of motion.  Cardiovascular: Normal rate and normal heart sounds.   Pulmonary/Chest: He has wheezes.  Rhonchi, wheezes,   Abdominal: He exhibits no distension.  Musculoskeletal: Normal range of motion.  Neurological: He is alert and oriented to person, place, and time.  Psychiatric: He has a normal mood and affect.  Nursing note and vitals reviewed.   ED Course  Procedures (including critical care time) Labs Review Labs Reviewed - No data to display  Imaging Review Dg Chest 2 View  03/17/2015   CLINICAL DATA:  Wheezing and shortness of breath for the past 2 days. Smoker.  EXAM: CHEST  2 VIEW  COMPARISON:  07/29/2014.  FINDINGS: Stable enlarged cardiac silhouette and tortuous aorta. Interval patchy airspace opacity in both lungs. No pleural fluid seen. Lower thoracic spine degenerative changes.  IMPRESSION: 1. Interval patchy pneumonia in both lungs. Alveolar edema is less likely. 2. Stable cardiomegaly.   Electronically Signed   By: Claudie Revering M.D.   On: 03/17/2015 18:11     MDM   1. Community acquired pneumonia    No relief with albuterol neb,  Chest xray interval patchy pneumonia  Pt to ED for evaluation.   He sees [place], [place] 03/17/15 [phone]

## 2015-03-17 NOTE — H&P (Signed)
PCP:   Beverlyn Roux, MD   Chief Complaint:  Wheezing, shortness of breath, chest pressure  HPI: This is 54 year old African-American male with past medical history of hypertensive cardio myopathy, chronic kidney disease who presented with 2 days of shortness of breath with minimal exertion, wheezing, multiple intermittent episodes of chest tightness and chest pressure. Patient initially attributed his chest tightness to indigestion and for getting stuck. He also noted orthopnea and PND. He went to urgent care for an evaluation. Chest x-ray showed bilateral infiltrates that were initially interpreted as pneumonia patient was sent to the emergency department at [facility]. However his initial troponin turned out to be 10.33.   patient denied any previous episodes of COPD exacerbation despite the fact that he is a smoker, denied use of inhalers. He also denied any chills, fevers, sick contacts, cough productive or nonproductive, denied dysuria, denied diarrhea, denied chest pain with breathing.    urgency department patient received 1 L of normal saline, breathing treatments, 125 mg of Solu-Medrol, 20 mg of Pepcid IV   Review of Systems:  All systems were reviewed and were negative except mentioned in the history of present illness  Past Medical History: Past Medical History  Diagnosis Date  . Hypertensive cardiomyopathy   . Chronic kidney disease   . Atrial fibrillation     Isolated episode  . Hypertension     denies  . Dysrhythmia     atrial fib 11/13  . GERD (gastroesophageal reflux disease)     occ  . Arthritis   . TMJ arthralgia    Past Surgical History  Procedure Laterality Date  . Hip surgery Left 72    lft   . Total hip arthroplasty Right 03,01    x2  . Appendectomy    . Laparoscopy  09/22/2012    Procedure: LAPAROSCOPY DIAGNOSTIC;  Surgeon: Harl Bowie, MD;  Location: [facility];  Service: General;  Laterality: N/A;  . Laparoscopic appendectomy  09/22/2012   Procedure: APPENDECTOMY LAPAROSCOPIC;  Surgeon: Harl Bowie, MD;  Location: [facility];  Service: General;;  . Total hip arthroplasty with hardware removal Left 08/05/2014    Procedure: TOTAL HIP ARTHROPLASTY WITH HARDWARE REMOVAL;  Surgeon: Kerin Salen, MD;  Location: [facility];  Service: Orthopedics;  Laterality: Left;    Medications: Prior to Admission medications   Medication Sig Start Date End Date Taking? Authorizing Provider  baclofen (LIORESAL) 10 MG tablet Take 1 tablet (10 mg total) by mouth 3 (three) times daily. 11/25/14  Yes Frazier Richards, MD  cetirizine (ZYRTEC) 10 MG tablet Take 1 tablet (10 mg total) by mouth daily. 11/19/14  Yes Frazier Richards, MD  fluticasone (FLONASE) 50 MCG/ACT nasal spray Place 1 spray into both nostrils daily. 11/20/14  Yes Frazier Richards, MD  montelukast (SINGULAIR) 10 MG tablet Take 1 tablet (10 mg total) by mouth at bedtime. 11/20/14  Yes Frazier Richards, MD  oxyCODONE-acetaminophen (PERCOCET) 7.5-325 MG per tablet Take 1 tablet by mouth every 6 (six) hours as needed for severe pain. 03/04/15  Yes Frazier Richards, MD  predniSONE (DELTASONE) 50 MG tablet Take 1 tablet (50 mg total) by mouth daily with breakfast. 03/04/15  Yes Frazier Richards, MD  tadalafil (CIALIS) 5 MG tablet Take 1 tablet (5 mg total) by mouth daily as needed for erectile dysfunction. 11/25/14  Yes Frazier Richards, MD  enoxaparin (LOVENOX) 40 MG/0.4ML injection Inject 0.4 mLs (40 mg total) into the skin daily. Patient not  taking: Reported on 03/17/2015 08/05/14   Leighton Parody, PA-C  HYDROcodone-acetaminophen [facility]) 7.5-325 MG per tablet Take 1 tablet by mouth every 6 (six) hours as needed for moderate pain. Patient not taking: Reported on 03/17/2015 08/27/14   Estill Dooms, MD  LORazepam (ATIVAN) 1 MG tablet Take one tablet by mouth every 6 hours as needed for signs/symptoms of withdrawals Patient not taking: Reported on 03/17/2015 08/21/14   Blanchie Serve, MD  methocarbamol (ROBAXIN) 500 MG tablet Take  1 tablet (500 mg total) by mouth 2 (two) times daily with a meal. Patient not taking: Reported on 03/17/2015 08/05/14   Leighton Parody, PA-C  nortriptyline (PAMELOR) 25 MG capsule Take 1 capsule (25 mg total) by mouth 2 (two) times daily as needed (for jaw pain). Patient not taking: Reported on 03/17/2015 07/23/14   Frazier Richards, MD    Allergies:   Allergies  Allergen Reactions  . Ibuprofen Other (See Comments)    Kidney damage  . Nsaids     REACTION: nsaid induced nephropathy    Social History:  reports that he has been smoking Cigarettes.  He has a 2.5 pack-year smoking history. He has never used smokeless tobacco. He reports that he drinks about 1.2 oz of alcohol per week. He reports that he does not use illicit drugs.  Family History: Reviewed and non-pertinent  PHYSICAL EXAM:  Filed Vitals:   03/17/15 2330 03/17/15 2345 03/18/15 0000 03/18/15 0015  BP:  158/79 150/92 155/97  Pulse: 58 74 73 69  Temp:      TempSrc:      Resp:  $Remo'23 22 18  'bUQrv$ Height:      Weight:      SpO2: 94% 93% 95% 95%   General:  Well appearing. No respiratory difficulty HEENT: normal Neck: supple. JVD up to the earlobe. Carotids 2+ bilat; no bruits. No lymphadenopathy or thryomegaly appreciated. Cor: PMI nondisplaced. Regular rate & rhythm. No rubs, gallops or murmurs. Lungs: coarse breath sounds bilat, scattered crackles Abdomen: soft, nontender, nondistended. No hepatosplenomegaly. No bruits or masses. Good bowel sounds. Extremities: no cyanosis, clubbing, rash, 1+ bilat LE edema mid shins down  Neuro: alert & oriented x 3, cranial nerves grossly intact. moves all 4 extremities w/o difficulty. Affect pleasant.  Bedside ultrasound: CV: Normal RV and LV function, moderate LVH, no apparent regional wall motion abnormalities, dilated IVC >3 cm, with no respiratory variation, no  pericardial effusion  Lungs: Bilateral B lines - suggestive of pulmonary edema  Labs on Admission:   Recent Labs   03/17/15 1953  NA 143  K 3.8  CL 108  CO2 24  GLUCOSE 103*  BUN 14  CREATININE 1.63*  CALCIUM 9.2    Recent Labs  03/17/15 1953  AST 39  ALT 41  ALKPHOS 62  BILITOT 2.4*  PROT 6.6  ALBUMIN 3.7   No results for input(s): LIPASE, AMYLASE in the last 72 hours.  Recent Labs  03/17/15 1953  WBC 11.0*  NEUTROABS 8.4*  HGB 11.9*  HCT 36.9*  MCV 78.5  PLT 177    Recent Labs  03/17/15 1933  TROPONINI 10.33*   No results for input(s): TSH, T4TOTAL, T3FREE, THYROIDAB in the last 72 hours.  Invalid input(s): FREET3 No results for input(s): VITAMINB12, FOLATE, FERRITIN, TIBC, IRON, RETICCTPCT in the last 72 hours.  Radiological Exams on Admission (all images were personally reviewed and interpreted by me, radiology reports were reviewed as well): Dg Chest 2 View  03/17/2015  CLINICAL DATA:  Wheezing and shortness of breath for the past 2 days. Smoker.  EXAM: CHEST  2 VIEW  COMPARISON:  07/29/2014.  FINDINGS: Stable enlarged cardiac silhouette and tortuous aorta. Interval patchy airspace opacity in both lungs. No pleural fluid seen. Lower thoracic spine degenerative changes.  IMPRESSION: 1. Interval patchy pneumonia in both lungs. Alveolar edema is less likely. 2. Stable cardiomegaly.   Electronically Signed   By: Claudie Revering M.D.   On: 03/17/2015 18:11    EKG personally reviewed and interpreted by me: Sinus rhythm 78 bpm with multiple PVCs, LVH with repolarization abnormalities, minimal ST segment depressions in III, avF  Assessment/Plan  NSTEMI Acute diastolic congestive heart failure with pulmonary edema Leukocytosis Chronic kidney disease stage 3 Anemia - likely anemia of chronic disease   NSTEMI No active chest pain at the moment, EKG with slight inferior ST depressions, however patient is not able to lay flat at the moment. Will defer cardiac catheterization until tomorrow we will diureses in the meanwhile Aspirin 325 mg 1 and 81 mg daily Heparin drip ACS  normal gram Lipitor 80 mg daily Metoprolol 25 mg twice a day by mouth  Check TSH, hemoglobin A1c, lipid profile in the morning  Acute diastolic congestive heart failure with pulmonary edema Lasix 80 mg IV twice a day Metolazone 5 mg 1 Strict ins and outs, Daily weights ECHO  Leukocytosis patient has been intermittently taking prednisone at home, there is no fever, productive cough, dysuria, sick contacts, diarrhea. X-ray findings are likely related to pulmonary edema rather than pneumonia  will monitor clinically, no indication for antibiotics as of now   Chronic kidney disease stage 3 Anemia - likely anemia of chronic disease Will monitor creatinine and hemoglobin levels Check 25-hydroxy vitamin D level  Check iron panel, reticulocyte count in the morning   GI prophylaxis with Protonix 40 mg by mouth daily, given history of GERD, microcytic anemia and at least dual antithrombotic therapy at the moment  [place], [place] 03/18/2015, 12:33 AM

## 2015-03-17 NOTE — ED Provider Notes (Signed)
Medical screening examination/treatment/procedure(s) were conducted as a shared visit with non-physician practitioner(s) and myself.  I personally evaluated the patient during the encounter.   EKG Interpretation   Date/Time:  Monday Mar 17 2015 19:44:26 EDT Ventricular Rate:  78 PR Interval:  124 QRS Duration: 90 QT Interval:  412 QTC Calculation: 469 R Axis:   16 Text Interpretation:  Sinus rhythm with Premature ventricular complexes or  Fusion complexes Possible Left atrial enlargement Left ventricular  hypertrophy Anteroseptal infarct , age undetermined ST \\T \ T wave  abnormality, consider lateral ischemia Abnormal ECG Confirmed by Taylee Gunnells   MD, Selvin Yun [phone]) on 03/17/2015 10:02:34 PM      Results for orders placed or performed during the hospital encounter of 03/17/15  CBC with Differential  Result Value Ref Range   WBC 11.0 (H) 4.0 - 10.5 K/uL   RBC 4.70 4.22 - 5.81 MIL/uL   Hemoglobin 11.9 (L) 13.0 - 17.0 g/dL   HCT 36.9 (L) 39.0 - 52.0 %   MCV 78.5 78.0 - 100.0 fL   MCH 25.3 (L) 26.0 - 34.0 pg   MCHC 32.2 30.0 - 36.0 g/dL   RDW 14.5 11.5 - 15.5 %   Platelets 177 150 - 400 K/uL   Neutrophils Relative % 75 43 - 77 %   Neutro Abs 8.4 (H) 1.7 - 7.7 K/uL   Lymphocytes Relative 16 12 - 46 %   Lymphs Abs 1.7 0.7 - 4.0 K/uL   Monocytes Relative 8 3 - 12 %   Monocytes Absolute 0.9 0.1 - 1.0 K/uL   Eosinophils Relative 1 0 - 5 %   Eosinophils Absolute 0.1 0.0 - 0.7 K/uL   Basophils Relative 0 0 - 1 %   Basophils Absolute 0.0 0.0 - 0.1 K/uL  Comprehensive metabolic panel  Result Value Ref Range   Sodium 143 135 - 145 mmol/L   Potassium 3.8 3.5 - 5.1 mmol/L   Chloride 108 101 - 111 mmol/L   CO2 24 22 - 32 mmol/L   Glucose, Bld 103 (H) 70 - 99 mg/dL   BUN 14 6 - 20 mg/dL   Creatinine, Ser 1.63 (H) 0.61 - 1.24 mg/dL   Calcium 9.2 8.9 - 10.3 mg/dL   Total Protein 6.6 6.5 - 8.1 g/dL   Albumin 3.7 3.5 - 5.0 g/dL   AST 39 15 - 41 U/L   ALT 41 17 - 63 U/L   Alkaline  Phosphatase 62 38 - 126 U/L   Total Bilirubin 2.4 (H) 0.3 - 1.2 mg/dL   GFR calc non Af Amer 46 (L) >60 mL/min   GFR calc Af Amer 53 (L) >60 mL/min   Anion gap 11 5 - 15  Troponin I  Result Value Ref Range   Troponin I 10.33 (HH) <0.031 ng/mL  Brain natriuretic peptide  Result Value Ref Range   B Natriuretic Peptide 1269.3 (H) 0.0 - 100.0 pg/mL   Dg Chest 2 View  03/17/2015   CLINICAL DATA:  Wheezing and shortness of breath for the past 2 days. Smoker.  EXAM: CHEST  2 VIEW  COMPARISON:  07/29/2014.  FINDINGS: Stable enlarged cardiac silhouette and tortuous aorta. Interval patchy airspace opacity in both lungs. No pleural fluid seen. Lower thoracic spine degenerative changes.  IMPRESSION: 1. Interval patchy pneumonia in both lungs. Alveolar edema is less likely. 2. Stable cardiomegaly.   Electronically Signed   By: Claudie Revering M.D.   On: 03/17/2015 18:11    Patient with symptoms that are truly consistent  with a pneumonia. Patient has a marked elevation of troponin but historically other than some indigestion doesn't seem to really have any significant chest discomfort pressure or any radiation of the pain. However it is elevated will require admission discussed with the cardiac fellow. They will see the patient. Will start patient on heparin. Patient also given aspirin.  Lungs are clear bilaterally heart regular rate and rhythm. Not tachycardic. Abdomen soft without any significant tenderness.  Patient's EKG did show some evidence of some ST segment depression laterally. But certainly no evidence of any Q waves or ST segment elevation.  Fredia Sorrow, MD 03/17/15 2328

## 2015-03-17 NOTE — ED Notes (Signed)
Pt. Stated, I've been outside lately rain and all.  Its caused me to be SOB, and I have some stomach pain.

## 2015-03-18 ENCOUNTER — Inpatient Hospital Stay (HOSPITAL_COMMUNITY): Payer: Medicare HMO

## 2015-03-18 ENCOUNTER — Encounter (HOSPITAL_COMMUNITY): Payer: Self-pay | Admitting: Nurse Practitioner

## 2015-03-18 DIAGNOSIS — Z23 Encounter for immunization: Secondary | ICD-10-CM | POA: Diagnosis not present

## 2015-03-18 DIAGNOSIS — R0602 Shortness of breath: Secondary | ICD-10-CM | POA: Diagnosis present

## 2015-03-18 DIAGNOSIS — K219 Gastro-esophageal reflux disease without esophagitis: Secondary | ICD-10-CM | POA: Diagnosis present

## 2015-03-18 DIAGNOSIS — E785 Hyperlipidemia, unspecified: Secondary | ICD-10-CM | POA: Diagnosis present

## 2015-03-18 DIAGNOSIS — I10 Essential (primary) hypertension: Secondary | ICD-10-CM

## 2015-03-18 DIAGNOSIS — I5031 Acute diastolic (congestive) heart failure: Secondary | ICD-10-CM

## 2015-03-18 DIAGNOSIS — I214 Non-ST elevation (NSTEMI) myocardial infarction: Secondary | ICD-10-CM

## 2015-03-18 DIAGNOSIS — F17210 Nicotine dependence, cigarettes, uncomplicated: Secondary | ICD-10-CM | POA: Diagnosis present

## 2015-03-18 DIAGNOSIS — I480 Paroxysmal atrial fibrillation: Secondary | ICD-10-CM | POA: Diagnosis present

## 2015-03-18 DIAGNOSIS — Z96649 Presence of unspecified artificial hip joint: Secondary | ICD-10-CM | POA: Diagnosis present

## 2015-03-18 DIAGNOSIS — I2510 Atherosclerotic heart disease of native coronary artery without angina pectoris: Secondary | ICD-10-CM | POA: Diagnosis not present

## 2015-03-18 DIAGNOSIS — I421 Obstructive hypertrophic cardiomyopathy: Secondary | ICD-10-CM | POA: Diagnosis present

## 2015-03-18 DIAGNOSIS — D649 Anemia, unspecified: Secondary | ICD-10-CM | POA: Diagnosis not present

## 2015-03-18 DIAGNOSIS — D509 Iron deficiency anemia, unspecified: Secondary | ICD-10-CM | POA: Diagnosis present

## 2015-03-18 DIAGNOSIS — Z720 Tobacco use: Secondary | ICD-10-CM

## 2015-03-18 DIAGNOSIS — R739 Hyperglycemia, unspecified: Secondary | ICD-10-CM | POA: Diagnosis present

## 2015-03-18 DIAGNOSIS — Z886 Allergy status to analgesic agent status: Secondary | ICD-10-CM | POA: Diagnosis not present

## 2015-03-18 DIAGNOSIS — I509 Heart failure, unspecified: Secondary | ICD-10-CM

## 2015-03-18 DIAGNOSIS — I700 Atherosclerosis of aorta: Secondary | ICD-10-CM | POA: Diagnosis present

## 2015-03-18 DIAGNOSIS — Z7951 Long term (current) use of inhaled steroids: Secondary | ICD-10-CM | POA: Diagnosis not present

## 2015-03-18 DIAGNOSIS — N183 Chronic kidney disease, stage 3 (moderate): Secondary | ICD-10-CM

## 2015-03-18 DIAGNOSIS — I5033 Acute on chronic diastolic (congestive) heart failure: Secondary | ICD-10-CM | POA: Diagnosis present

## 2015-03-18 DIAGNOSIS — Z7952 Long term (current) use of systemic steroids: Secondary | ICD-10-CM | POA: Diagnosis not present

## 2015-03-18 DIAGNOSIS — I130 Hypertensive heart and chronic kidney disease with heart failure and stage 1 through stage 4 chronic kidney disease, or unspecified chronic kidney disease: Secondary | ICD-10-CM | POA: Diagnosis present

## 2015-03-18 DIAGNOSIS — Z79899 Other long term (current) drug therapy: Secondary | ICD-10-CM | POA: Diagnosis not present

## 2015-03-18 HISTORY — DX: Non-ST elevation (NSTEMI) myocardial infarction: I21.4

## 2015-03-18 HISTORY — DX: Acute diastolic (congestive) heart failure: I50.31

## 2015-03-18 LAB — HEPARIN LEVEL (UNFRACTIONATED)
HEPARIN UNFRACTIONATED: 0.16 [IU]/mL — AB (ref 0.30–0.70)
Heparin Unfractionated: 0.41 IU/mL (ref 0.30–0.70)
Heparin Unfractionated: 0.35 IU/mL (ref 0.30–0.70)

## 2015-03-18 LAB — BASIC METABOLIC PANEL
ANION GAP: 11 (ref 5–15)
Anion gap: 10 (ref 5–15)
BUN: 13 mg/dL (ref 6–20)
BUN: 17 mg/dL (ref 6–20)
CALCIUM: 8.8 mg/dL — AB (ref 8.9–10.3)
CHLORIDE: 105 mmol/L (ref 101–111)
CO2: 22 mmol/L (ref 22–32)
CO2: 25 mmol/L (ref 22–32)
CREATININE: 1.79 mg/dL — AB (ref 0.61–1.24)
Calcium: 8.8 mg/dL — ABNORMAL LOW (ref 8.9–10.3)
Chloride: 108 mmol/L (ref 101–111)
Creatinine, Ser: 1.69 mg/dL — ABNORMAL HIGH (ref 0.61–1.24)
GFR calc Af Amer: 51 mL/min — ABNORMAL LOW (ref >60)
GFR calc non Af Amer: 41 mL/min — ABNORMAL LOW (ref >60)
GFR calc non Af Amer: 44 mL/min — ABNORMAL LOW (ref >60)
GFR, EST AFRICAN AMERICAN: 47 mL/min — AB (ref >60)
Glucose, Bld: 252 mg/dL — ABNORMAL HIGH (ref 70–99)
Glucose, Bld: 155 mg/dL — ABNORMAL HIGH (ref 70–99)
POTASSIUM: 3.7 mmol/L (ref 3.5–5.1)
Potassium: 4.2 mmol/L (ref 3.5–5.1)
Sodium: 140 mmol/L (ref 135–145)
Sodium: 141 mmol/L (ref 135–145)

## 2015-03-18 LAB — MAGNESIUM
MAGNESIUM: 1.6 mg/dL — AB (ref 1.7–2.4)
Magnesium: 2.2 mg/dL (ref 1.7–2.4)

## 2015-03-18 LAB — LIPID PANEL
CHOL/HDL RATIO: 2.7 ratio
Cholesterol: 162 mg/dL (ref 0–200)
HDL: 60 mg/dL (ref >40)
LDL CALC: 94 mg/dL (ref 0–99)
Triglycerides: 41 mg/dL (ref <150)
VLDL: 8 mg/dL (ref 0–40)

## 2015-03-18 LAB — CBC
HCT: 37 % — ABNORMAL LOW (ref 39.0–52.0)
Hemoglobin: 12.4 g/dL — ABNORMAL LOW (ref 13.0–17.0)
MCH: 25.6 pg — ABNORMAL LOW (ref 26.0–34.0)
MCHC: 33.5 g/dL (ref 30.0–36.0)
MCV: 76.4 fL — ABNORMAL LOW (ref 78.0–100.0)
PLATELETS: 154 10*3/uL (ref 150–400)
RBC: 4.84 MIL/uL (ref 4.22–5.81)
RDW: 14.2 % (ref 11.5–15.5)
WBC: 12.8 10*3/uL — AB (ref 4.0–10.5)

## 2015-03-18 LAB — MRSA PCR SCREENING: MRSA by PCR: NEGATIVE

## 2015-03-18 LAB — FERRITIN: FERRITIN: 55 ng/mL (ref 24–336)

## 2015-03-18 LAB — PROTIME-INR
INR: 1.16 (ref 0.00–1.49)
PROTHROMBIN TIME: 14.9 s (ref 11.6–15.2)

## 2015-03-18 LAB — IRON AND TIBC
IRON: 15 ug/dL — AB (ref 45–182)
Saturation Ratios: 4 % — ABNORMAL LOW (ref 17.9–39.5)
TIBC: 344 ug/dL (ref 250–450)
UIBC: 329 ug/dL

## 2015-03-18 LAB — RETICULOCYTES
RBC.: 4.84 MIL/uL (ref 4.22–5.81)
RETIC CT PCT: 1.7 % (ref 0.4–3.1)
Retic Count, Absolute: 82.3 10*3/uL (ref 19.0–186.0)

## 2015-03-18 LAB — TROPONIN I
Troponin I: 7.99 ng/mL (ref <0.031)
Troponin I: 4.73 ng/mL (ref <0.031)
Troponin I: 4.59 ng/mL (ref <0.031)

## 2015-03-18 LAB — TSH: TSH: 0.401 u[IU]/mL (ref 0.350–4.500)

## 2015-03-18 MED ORDER — OXYCODONE-ACETAMINOPHEN 5-325 MG PO TABS
1.0000 | ORAL_TABLET | Freq: Four times a day (QID) | ORAL | Status: DC | PRN
  Administered 2015-03-18 – 2015-03-20 (×6): 1 via ORAL
  Filled 2015-03-18 (×5): qty 1

## 2015-03-18 MED ORDER — OXYCODONE-ACETAMINOPHEN 7.5-325 MG PO TABS: 1.0000 | ORAL_TABLET | Freq: Four times a day (QID) | ORAL | Status: DC | PRN

## 2015-03-18 MED ORDER — HEPARIN BOLUS VIA INFUSION
3000.0000 [IU] | Freq: Once | INTRAVENOUS | Status: AC
  Administered 2015-03-18: 3000 [IU] via INTRAVENOUS
  Filled 2015-03-18: qty 3000

## 2015-03-18 MED ORDER — NITROGLYCERIN 0.4 MG SL SUBL: 0.4000 mg | SUBLINGUAL_TABLET | SUBLINGUAL | Status: DC | PRN

## 2015-03-18 MED ORDER — HEPARIN (PORCINE) IN NACL 100-0.45 UNIT/ML-% IJ SOLN
1800.0000 [IU]/h | INTRAMUSCULAR | Status: DC
  Administered 2015-03-18: 1500 [IU]/h via INTRAVENOUS
  Filled 2015-03-18 (×4): qty 250

## 2015-03-18 MED ORDER — LORATADINE 10 MG PO TABS
10.0000 mg | ORAL_TABLET | Freq: Every day | ORAL | Status: DC
  Administered 2015-03-18 – 2015-03-20 (×3): 10 mg via ORAL
  Filled 2015-03-18 (×3): qty 1

## 2015-03-18 MED ORDER — BACLOFEN 10 MG PO TABS
10.0000 mg | ORAL_TABLET | Freq: Three times a day (TID) | ORAL | Status: DC
  Administered 2015-03-18 – 2015-03-20 (×6): 10 mg via ORAL
  Filled 2015-03-18 (×8): qty 1

## 2015-03-18 MED ORDER — ONDANSETRON HCL 4 MG/2ML IJ SOLN: 4.0000 mg | Freq: Four times a day (QID) | INTRAMUSCULAR | Status: DC | PRN

## 2015-03-18 MED ORDER — METOPROLOL TARTRATE 25 MG PO TABS
25.0000 mg | ORAL_TABLET | Freq: Two times a day (BID) | ORAL | Status: DC
  Administered 2015-03-18 – 2015-03-20 (×6): 25 mg via ORAL
  Filled 2015-03-18 (×7): qty 1

## 2015-03-18 MED ORDER — MAGNESIUM SULFATE 2 GM/50ML IV SOLN
2.0000 g | Freq: Once | INTRAVENOUS | Status: AC
  Administered 2015-03-18: 2 g via INTRAVENOUS
  Filled 2015-03-18: qty 50

## 2015-03-18 MED ORDER — FUROSEMIDE 10 MG/ML IJ SOLN
80.0000 mg | Freq: Two times a day (BID) | INTRAMUSCULAR | Status: DC
  Filled 2015-03-18 (×3): qty 8

## 2015-03-18 MED ORDER — ACETAMINOPHEN 325 MG PO TABS: 650.0000 mg | ORAL_TABLET | ORAL | Status: DC | PRN

## 2015-03-18 MED ORDER — METOLAZONE 2.5 MG PO TABS
5.0000 mg | ORAL_TABLET | Freq: Once | ORAL | Status: DC
  Filled 2015-03-18: qty 1

## 2015-03-18 MED ORDER — SODIUM CHLORIDE 0.9 % IV SOLN
INTRAVENOUS | Status: DC
  Administered 2015-03-18: 03:00:00 via INTRAVENOUS

## 2015-03-18 MED ORDER — FUROSEMIDE 10 MG/ML IJ SOLN
80.0000 mg | INTRAMUSCULAR | Status: AC
  Administered 2015-03-18: 80 mg via INTRAVENOUS
  Filled 2015-03-18: qty 8

## 2015-03-18 MED ORDER — ZOLPIDEM TARTRATE 5 MG PO TABS
5.0000 mg | ORAL_TABLET | Freq: Every evening | ORAL | Status: DC | PRN
  Administered 2015-03-20: 5 mg via ORAL
  Filled 2015-03-18: qty 1

## 2015-03-18 MED ORDER — PANTOPRAZOLE SODIUM 40 MG PO TBEC
40.0000 mg | DELAYED_RELEASE_TABLET | Freq: Every day | ORAL | Status: DC
  Administered 2015-03-18: 40 mg via ORAL
  Filled 2015-03-18: qty 1

## 2015-03-18 MED ORDER — ASPIRIN 325 MG PO TABS
325.0000 mg | ORAL_TABLET | ORAL | Status: AC
  Administered 2015-03-18: 325 mg via ORAL
  Filled 2015-03-18: qty 1

## 2015-03-18 MED ORDER — ASPIRIN EC 81 MG PO TBEC
81.0000 mg | DELAYED_RELEASE_TABLET | Freq: Every day | ORAL | Status: DC
  Administered 2015-03-19 – 2015-03-20 (×2): 81 mg via ORAL
  Filled 2015-03-18 (×2): qty 1

## 2015-03-18 MED ORDER — MONTELUKAST SODIUM 10 MG PO TABS
10.0000 mg | ORAL_TABLET | Freq: Every day | ORAL | Status: DC
  Administered 2015-03-18 (×2): 10 mg via ORAL
  Filled 2015-03-18 (×5): qty 1

## 2015-03-18 MED ORDER — ATORVASTATIN CALCIUM 40 MG PO TABS
80.0000 mg | ORAL_TABLET | Freq: Every day | ORAL | Status: DC
  Administered 2015-03-18 (×2): 80 mg via ORAL
  Filled 2015-03-18 (×3): qty 1

## 2015-03-18 MED ORDER — FUROSEMIDE 40 MG PO TABS
40.0000 mg | ORAL_TABLET | Freq: Every day | ORAL | Status: DC
  Administered 2015-03-18 – 2015-03-19 (×2): 40 mg via ORAL
  Filled 2015-03-18 (×2): qty 1

## 2015-03-18 MED ORDER — OXYCODONE HCL 5 MG PO TABS
2.5000 mg | ORAL_TABLET | Freq: Four times a day (QID) | ORAL | Status: DC | PRN
  Administered 2015-03-18 – 2015-03-20 (×6): 2.5 mg via ORAL
  Filled 2015-03-18 (×6): qty 1

## 2015-03-18 NOTE — Progress Notes (Signed)
Patient Name: Paul [name] Date of Encounter: 03/18/2015   Principal Problem:   Acute diastolic CHF (congestive heart failure), NYHA class 4 Active Problems:   Hypertrophic obstructive cardiomyopathy   NSTEMI (non-ST elevated myocardial infarction)   TOBACCO USE   Essential hypertension   CKD (chronic kidney disease), stage III   Hyperlipidemia   GERD   SUBJECTIVE  Breathing much improved.  No c/p.  CURRENT MEDS . [START ON 03/19/2015] aspirin EC  81 mg Oral Daily  . atorvastatin  80 mg Oral q1800  . baclofen  10 mg Oral TID  . furosemide  80 mg Intravenous BID  . loratadine  10 mg Oral Daily  . metolazone  5 mg Oral Once  . metoprolol tartrate  25 mg Oral BID  . montelukast  10 mg Oral QHS  . pantoprazole  40 mg Oral Q0600    OBJECTIVE  Filed Vitals:   03/18/15 0400 03/18/15 0600 03/18/15 0700 03/18/15 0754  BP: 130/73 140/106 130/75 130/75  Pulse: 57 56 55 54  Temp: 98.6 F (37 C)   98.5 F (36.9 C)  TempSrc: Oral   Oral  Resp: $Remo'24 18 15 19  'cTFSR$ Height:      Weight:      SpO2: 95% 98% 97% 97%    Intake/Output Summary (Last 24 hours) at 03/18/15 0758 Last data filed at 03/18/15 0700  Gross per 24 hour  Intake 173.43 ml  Output   5075 ml  Net -4901.57 ml   Filed Weights   03/17/15 1938 03/18/15 0138  Weight: 185 lb (83.915 kg) 197 lb 12 oz (89.7 kg)    PHYSICAL EXAM  General: Pleasant, NAD. Neuro: Alert and oriented X 3. Moves all extremities spontaneously. Psych: Normal affect. HEENT:  Normal  Neck: Supple without bruits.  JVP ~ 10cm. Lungs:  Resp regular and unlabored, few basilar crackles. Heart: RRR no s3, s4, or murmurs. Abdomen: Soft, non-tender, non-distended, BS + x 4.  Extremities: No clubbing, cyanosis or edema. DP/PT/Radials 2+ and equal bilaterally.  Accessory Clinical Findings  CBC  Recent Labs  03/17/15 1953 03/18/15 0238  WBC 11.0* 12.8*  NEUTROABS 8.4*  --   HGB 11.9* 12.4*  HCT 36.9* 37.0*  MCV 78.5 76.4*  PLT 177  182   Basic Metabolic Panel  Recent Labs  03/17/15 1953 03/18/15 0238  NA 143 140  K 3.8 3.7  CL 108 108  CO2 24 22  GLUCOSE 103* 252*  BUN 14 13  CREATININE 1.63* 1.79*  CALCIUM 9.2 8.8*  MG  --  1.6*   Liver Function Tests  Recent Labs  03/17/15 1953  AST 39  ALT 41  ALKPHOS 62  BILITOT 2.4*  PROT 6.6  ALBUMIN 3.7   Cardiac Enzymes  Recent Labs  03/17/15 1933 03/18/15 0238  TROPONINI 10.33* 7.99*    Fasting Lipid Panel  Recent Labs  03/18/15 0238  CHOL 162  HDL 60  LDLCALC 94  TRIG 41  CHOLHDL 2.7   Thyroid Function Tests  Recent Labs  03/18/15 0238  TSH 0.401   TELE  Rsr, pac's, pvc's, intermittent PR narrowing/intermittent ectopic atrial rhythm.  Radiology/Studies  Dg Chest 2 View  03/17/2015   CLINICAL DATA:  Wheezing and shortness of breath for the past 2 days. Smoker.  EXAM: CHEST  2 VIEW  COMPARISON:  07/29/2014.  FINDINGS: Stable enlarged cardiac silhouette and tortuous aorta. Interval patchy airspace opacity in both lungs. No pleural fluid seen. Lower thoracic spine degenerative changes.  IMPRESSION: 1. Interval patchy pneumonia in both lungs. Alveolar edema is less likely. 2. Stable cardiomegaly.   Electronically Signed   By: Paul [name] M.D.   On: 03/17/2015 18:11   ASSESSMENT AND PLAN  1.  Acute on chronic diastolic CHF/HOCM:  EF 25-85% by echo in 09/2012 with mod LVH, Gr 2 DD, dynamic obstruction, and mid-cavity obliteration.  He came in last night with progressive dyspnea, intermittent chest discomfort, and increasing abd girth.  He thinks that his weight runs 185 to 188 lbs @ home, though he was as low as 174 in Sept. 2015.  He came in @ 197 and is now 194 after diuresis (minus 5.2 L since admission).  He has had significant clinical improvement and currently appears euvolemic with relatively flat neck veins.  Creat has bumped from 1.63 to 1.79.  Transition to PO lasix.  With HOCM will need to be careful with regard to over-diuresis.  Echo pending today.  HR/BP stable on bb.  2.  NSTEMI:  Trop on presentation elevated @ 10.33, now 7.99.  Ss began over the weekend.  No active c/p.  No prior ischemic eval.  Await echo.  F/u creat in the AM and consider cath tomorrow with limited contrast if stable.  He is now able to lie flat.  Cont asa, statin, bb, heparin.  No acei/arb in setting of acute on chronic stage III kidney dzs.  3.  Essential HTN:  Currently stable on bb and lasix.  Follow.  4.  CKD III:  Creat up this AM.  Transition lasix to PO.  Follow.  5.  Tob Abuse:  Complete cessation advised.  6.  Hyperglycemia:  BG 252 early this AM.  Received solumedrol @ 10 pm last night.  No prior h/o DM.  Follow.  7.  Hypomagnesemia:  Supplemented earlier this AM.  F/u.  8.  Leukocytosis:  In setting of recent steroid usage.  Afebrile.  Initial cxr read out as pna however he has had clinical improvement with diuresis.  Will repeat CXR this AM and abx if necessary.  9. Ectopic atrial rhythm:  Intermittent PR narrowing.  Cont bb.  Follow.   Signed, Paul Hodgkins NP   I have examined the patient and reviewed assessment and plan and discussed with patient.  Agree with above as stated.  Needs cath. CHF sx much better after diuresis.  SHOB was main sx that brought him to the hospital.  Plan for tomorrow if renal function is stable.  Needs to stop smoking.  No ventriculogram tomorrow at cath.  Obtain echocardiogram. Spoke at length to the patient and his son.  Patient with know CRI.  Followed by Paul [name].   Paul S.

## 2015-03-18 NOTE — Progress Notes (Signed)
ANTICOAGULATION CONSULT NOTE - Follow Up Consult  Pharmacy Consult for Heparin Indication: chest pain/ACS  Allergies  Allergen Reactions  . Ibuprofen Other (See Comments)    Kidney damage  . Nsaids     REACTION: nsaid induced nephropathy    Patient Measurements: Height: 5' 11.5" (181.6 cm) Weight: 197 lb 12 oz (89.7 kg) IBW/kg (Calculated) : 76.45 Heparin Dosing Weight: 83.9 kg  Vital Signs: Temp: 97.8 F (36.6 C) (05/10 1900) Temp Source: Oral (05/10 1900) BP: 127/63 mmHg (05/10 1900) Pulse Rate: 54 (05/10 1900)  Labs:  Recent Labs  03/17/15 1953 03/18/15 0238 03/18/15 0755 03/18/15 1439 03/18/15 1510 03/18/15 2102  HGB 11.9* 12.4*  --   --   --   --   HCT 36.9* 37.0*  --   --   --   --   PLT 177 154  --   --   --   --   LABPROT  --  14.9  --   --   --   --   INR  --  1.16  --   --   --   --   HEPARINUNFRC  --   --  0.16*  --  0.41 0.35  CREATININE 1.63* 1.79* 1.69*  --   --   --   TROPONINI  --  7.99* 4.73* 4.59*  --   --     Estimated Creatinine Clearance: 52.8 mL/min (by C-G formula based on Cr of 1.69).  Assessment:   Heparin level tonight is low therapeutic (0.35) on 1500 units/hr.  Last level was 0.41 on same rate.  Goal of Therapy:  Heparin level 0.3-0.7 units/ml Monitor platelets by anticoagulation protocol: Yes   Plan:   Increase heparin drip to 1600 units/hr, to try to keep level in goal range.  Heparin level and CBC in am.  Arty Baumgartner, [place] Pager: [phone] 03/18/2015,10:15 PM

## 2015-03-18 NOTE — Progress Notes (Signed)
ANTICOAGULATION CONSULT NOTE - Initial Consult  Pharmacy Consult for Heparin  Indication: ACS Allergies  Allergen Reactions  . Ibuprofen Other (See Comments)    Kidney damage  . Nsaids     REACTION: nsaid induced nephropathy    Patient Measurements: Height: 5' 11.5" (181.6 cm) Weight: 197 lb 12 oz (89.7 kg) IBW/kg (Calculated) : 76.45 Heparin Dosing Weight: 83.9 kg  Vital Signs: Temp: 98.6 F (37 C) (05/10 1600) Temp Source: Oral (05/10 1140) BP: 114/70 mmHg (05/10 1600) Pulse Rate: 55 (05/10 1600)  Labs:  Recent Labs  03/17/15 1953 03/18/15 0238 03/18/15 0755 03/18/15 1439 03/18/15 1510  HGB 11.9* 12.4*  --   --   --   HCT 36.9* 37.0*  --   --   --   PLT 177 154  --   --   --   LABPROT  --  14.9  --   --   --   INR  --  1.16  --   --   --   HEPARINUNFRC  --   --  0.16*  --  0.41  CREATININE 1.63* 1.79* 1.69*  --   --   TROPONINI  --  7.99* 4.73* 4.59*  --     Estimated Creatinine Clearance: 52.8 mL/min (by C-G formula based on Cr of 1.69).   Medical History: Past Medical History  Diagnosis Date  . Hypertensive cardiomyopathy   . Chronic kidney disease   . Atrial fibrillation     Isolated episode  . Hypertension     denies  . Dysrhythmia     atrial fib 11/13  . GERD (gastroesophageal reflux disease)     occ  . Arthritis   . TMJ arthralgia   . HOCM (hypertrophic obstructive cardiomyopathy)     Assessment: 54 year old male with a history of paroxysmal atrial fibrillation, hypertension, esophageal reflux, and hypertensive cardiomyopathy. He presents to the emergency department from urgent care for further evaluation of shortness of breath.  Critical troponin received from lab-- 10.33.  Pharmacy consulted to dose heparin for ACS/STEMI.  Pt not on anticoagulation PTA.    Scr 1.63 > 1.79, Crcl ~50-55 mL/min Hgb 12.4, plt wnl, TSAT 4 low, Ferritin 55 not at goal.   Heparin level this PM is therapeutic at  0.41.  No reports of bleeding at this time.    Goal of Therapy:  Heparin level 0.3-0.7 units/ml Monitor platelets by anticoagulation protocol: Yes   Plan:  Continue Heparin drip at 1500 units/hr Follow up 6 hr heparin level  Daily heparin level and CBC Monitor for signs and symptoms of bleeding  Hassie Bruce, Pharm. D. Clinical Pharmacy Resident Pager: [phone] Ph: [phone] 03/18/2015 4:19 PM

## 2015-03-18 NOTE — Progress Notes (Signed)
Pt noncompliant with guidelines of unit. Pt cont to get out of bed without calling, yelling out instead of pushing call light for nurse to come. Pt educated on heparin, side effects, dangers of falling. Cont to voice he needs his privacy away from staff.

## 2015-03-18 NOTE — Progress Notes (Signed)
Utilization review completed. Cesiah Westley, RN, BSN. 

## 2015-03-18 NOTE — Progress Notes (Signed)
ANTICOAGULATION CONSULT NOTE - Initial Consult  Pharmacy Consult for Heparin  Indication: ACS Allergies  Allergen Reactions  . Ibuprofen Other (See Comments)    Kidney damage  . Nsaids     REACTION: nsaid induced nephropathy    Patient Measurements: Height: 5' 11.5" (181.6 cm) Weight: 197 lb 12 oz (89.7 kg) IBW/kg (Calculated) : 76.45 Heparin Dosing Weight: 83.9 kg  Vital Signs: Temp: 98.5 F (36.9 C) (05/10 0754) Temp Source: Oral (05/10 0754) BP: 122/80 mmHg (05/10 0800) Pulse Rate: 53 (05/10 0800)  Labs:  Recent Labs  03/17/15 1933 03/17/15 1953 03/18/15 0238 03/18/15 0755  HGB  --  11.9* 12.4*  --   HCT  --  36.9* 37.0*  --   PLT  --  177 154  --   LABPROT  --   --  14.9  --   INR  --   --  1.16  --   HEPARINUNFRC  --   --   --  0.16*  CREATININE  --  1.63* 1.79*  --   TROPONINI 10.33*  --  7.99*  --     Estimated Creatinine Clearance: 49.9 mL/min (by C-G formula based on Cr of 1.79).   Medical History: Past Medical History  Diagnosis Date  . Hypertensive cardiomyopathy   . Chronic kidney disease   . Atrial fibrillation     Isolated episode  . Hypertension     denies  . Dysrhythmia     atrial fib 11/13  . GERD (gastroesophageal reflux disease)     occ  . Arthritis   . TMJ arthralgia   . HOCM (hypertrophic obstructive cardiomyopathy)     Assessment: 54 year old male with a history of paroxysmal atrial fibrillation, hypertension, esophageal reflux, and hypertensive cardiomyopathy. He presents to the emergency department from urgent care for further evaluation of shortness of breath.  Critical troponin received from lab-- 10.33.  Pharmacy consulted to dose heparin for ACS/STEMI.  Pt not on anticoagulation PTA.    Scr 1.63 > 1.79, Crcl ~50-55 mL/min Hgb 12.4, plt wnl, TSAT 4 low, Ferritin 55 not at goal.   Heparin level this AM is SUBtherapeutic at  0.16.  Will require IV heparin bolus and increase rate of infusion.  No reports of bleeding at this  time.   Goal of Therapy:  Heparin level 0.3-0.7 units/ml Monitor platelets by anticoagulation protocol: Yes   Plan:  Heparin 3000 units IV bolus x1 Heparin drip 1500 units/hr Follow up 6 hr heparin level Daily heparin level and CBC  Hassie Bruce, Pharm. D. Clinical Pharmacy Resident Pager: [phone] Ph: [phone] 03/18/2015 9:05 AM

## 2015-03-18 NOTE — Progress Notes (Signed)
  Echocardiogram 2D Echocardiogram has been performed.  Paul [name] 03/18/2015, 1:38 PM

## 2015-03-19 ENCOUNTER — Encounter (HOSPITAL_COMMUNITY)
Admission: EM | Disposition: A | Discharge status: Home / Self Care | Payer: Medicare HMO | Source: Home / Self Care | Attending: Cardiology

## 2015-03-19 DIAGNOSIS — D509 Iron deficiency anemia, unspecified: Secondary | ICD-10-CM

## 2015-03-19 DIAGNOSIS — I498 Other specified cardiac arrhythmias: Secondary | ICD-10-CM

## 2015-03-19 DIAGNOSIS — I2510 Atherosclerotic heart disease of native coronary artery without angina pectoris: Secondary | ICD-10-CM

## 2015-03-19 DIAGNOSIS — D72829 Elevated white blood cell count, unspecified: Secondary | ICD-10-CM

## 2015-03-19 DIAGNOSIS — D649 Anemia, unspecified: Secondary | ICD-10-CM

## 2015-03-19 HISTORY — DX: Iron deficiency anemia, unspecified: D50.9

## 2015-03-19 HISTORY — PX: CARDIAC CATHETERIZATION: SHX172

## 2015-03-19 HISTORY — DX: Other specified cardiac arrhythmias: I49.8

## 2015-03-19 LAB — URINALYSIS, ROUTINE W REFLEX MICROSCOPIC
Bilirubin Urine: NEGATIVE
GLUCOSE, UA: NEGATIVE mg/dL
Hgb urine dipstick: NEGATIVE
Ketones, ur: NEGATIVE mg/dL
LEUKOCYTES UA: NEGATIVE
NITRITE: NEGATIVE
Protein, ur: NEGATIVE mg/dL
Specific Gravity, Urine: 1.009 (ref 1.005–1.030)
Urobilinogen, UA: 0.2 mg/dL (ref 0.0–1.0)
pH: 5 (ref 5.0–8.0)

## 2015-03-19 LAB — BASIC METABOLIC PANEL
Anion gap: 10 (ref 5–15)
BUN: 27 mg/dL — ABNORMAL HIGH (ref 6–20)
CALCIUM: 8.5 mg/dL — AB (ref 8.9–10.3)
CO2: 26 mmol/L (ref 22–32)
Chloride: 105 mmol/L (ref 101–111)
Creatinine, Ser: 1.82 mg/dL — ABNORMAL HIGH (ref 0.61–1.24)
GFR calc Af Amer: 46 mL/min — ABNORMAL LOW (ref >60)
GFR, EST NON AFRICAN AMERICAN: 40 mL/min — AB (ref >60)
GLUCOSE: 121 mg/dL — AB (ref 70–99)
Potassium: 3.9 mmol/L (ref 3.5–5.1)
SODIUM: 141 mmol/L (ref 135–145)

## 2015-03-19 LAB — CBC
HCT: 33.5 % — ABNORMAL LOW (ref 39.0–52.0)
HEMOGLOBIN: 11.1 g/dL — AB (ref 13.0–17.0)
MCH: 25.5 pg — ABNORMAL LOW (ref 26.0–34.0)
MCHC: 33.1 g/dL (ref 30.0–36.0)
MCV: 77 fL — ABNORMAL LOW (ref 78.0–100.0)
PLATELETS: 151 10*3/uL (ref 150–400)
RBC: 4.35 MIL/uL (ref 4.22–5.81)
RDW: 14.2 % (ref 11.5–15.5)
WBC: 14.8 10*3/uL — ABNORMAL HIGH (ref 4.0–10.5)

## 2015-03-19 LAB — STREP PNEUMONIAE URINARY ANTIGEN: STREP PNEUMO URINARY ANTIGEN: NEGATIVE

## 2015-03-19 LAB — HEPARIN LEVEL (UNFRACTIONATED)
HEPARIN UNFRACTIONATED: 0.21 [IU]/mL — AB (ref 0.30–0.70)
HEPARIN UNFRACTIONATED: 0.46 [IU]/mL (ref 0.30–0.70)

## 2015-03-19 LAB — HEMOGLOBIN A1C
Hgb A1c MFr Bld: 5.6 % (ref 4.8–5.6)
MEAN PLASMA GLUCOSE: 114 mg/dL

## 2015-03-19 LAB — POCT ACTIVATED CLOTTING TIME: Activated Clotting Time: 454 seconds

## 2015-03-19 SURGERY — LEFT HEART CATH AND CORONARY ANGIOGRAPHY

## 2015-03-19 MED ORDER — LIDOCAINE HCL (PF) 1 % IJ SOLN
INTRAMUSCULAR | Status: AC
  Filled 2015-03-19: qty 30

## 2015-03-19 MED ORDER — BIVALIRUDIN BOLUS VIA INFUSION - CUPID
INTRAVENOUS | Status: DC | PRN
  Administered 2015-03-19: 66.15 mg via INTRAVENOUS

## 2015-03-19 MED ORDER — ASPIRIN 81 MG PO CHEW: 81.0000 mg | CHEWABLE_TABLET | ORAL | Status: AC

## 2015-03-19 MED ORDER — TICAGRELOR 90 MG PO TABS
ORAL_TABLET | ORAL | Status: AC
  Filled 2015-03-19: qty 1

## 2015-03-19 MED ORDER — PANTOPRAZOLE SODIUM 40 MG PO TBEC
40.0000 mg | DELAYED_RELEASE_TABLET | Freq: Every day | ORAL | Status: DC
  Administered 2015-03-19 – 2015-03-20 (×2): 40 mg via ORAL
  Filled 2015-03-19 (×2): qty 1

## 2015-03-19 MED ORDER — BIVALIRUDIN 250 MG IV SOLR
INTRAVENOUS | Status: AC
  Filled 2015-03-19: qty 250

## 2015-03-19 MED ORDER — HEPARIN SODIUM (PORCINE) 1000 UNIT/ML IJ SOLN
INTRAMUSCULAR | Status: AC
  Filled 2015-03-19: qty 1

## 2015-03-19 MED ORDER — VERAPAMIL HCL 2.5 MG/ML IV SOLN
INTRAVENOUS | Status: AC
  Filled 2015-03-19: qty 2

## 2015-03-19 MED ORDER — SODIUM CHLORIDE 0.9 % IV SOLN: 250.0000 mL | INTRAVENOUS | Status: DC | PRN

## 2015-03-19 MED ORDER — SODIUM CHLORIDE 0.9 % IV SOLN
250.0000 mg | INTRAVENOUS | Status: DC | PRN
  Administered 2015-03-19: 1.75 mg/kg/h via INTRAVENOUS

## 2015-03-19 MED ORDER — HEPARIN SODIUM (PORCINE) 1000 UNIT/ML IJ SOLN
INTRAMUSCULAR | Status: DC | PRN
  Administered 2015-03-19: 4000 [IU] via INTRAVENOUS

## 2015-03-19 MED ORDER — SODIUM CHLORIDE 0.9 % IV SOLN: INTRAVENOUS | Status: DC

## 2015-03-19 MED ORDER — SODIUM CHLORIDE 0.9 % IJ SOLN
INTRAMUSCULAR | Status: DC | PRN
  Administered 2015-03-19: 18:00:00 via INTRA_ARTERIAL

## 2015-03-19 MED ORDER — FENTANYL CITRATE (PF) 100 MCG/2ML IJ SOLN
INTRAMUSCULAR | Status: AC
  Filled 2015-03-19: qty 2

## 2015-03-19 MED ORDER — TICAGRELOR 90 MG PO TABS
90.0000 mg | ORAL_TABLET | Freq: Two times a day (BID) | ORAL | Status: DC
  Administered 2015-03-20: 90 mg via ORAL

## 2015-03-19 MED ORDER — HEPARIN (PORCINE) IN NACL 2-0.9 UNIT/ML-% IJ SOLN
INTRAMUSCULAR | Status: AC
  Filled 2015-03-19: qty 1000

## 2015-03-19 MED ORDER — SODIUM CHLORIDE 0.9 % IJ SOLN: 3.0000 mL | Freq: Two times a day (BID) | INTRAMUSCULAR | Status: DC

## 2015-03-19 MED ORDER — SODIUM CHLORIDE 0.9 % IV SOLN
INTRAVENOUS | Status: AC
  Administered 2015-03-19: 20:00:00 via INTRAVENOUS

## 2015-03-19 MED ORDER — FENTANYL CITRATE (PF) 100 MCG/2ML IJ SOLN
INTRAMUSCULAR | Status: DC | PRN
  Administered 2015-03-19 (×2): 50 ug via INTRAVENOUS

## 2015-03-19 MED ORDER — IOHEXOL 350 MG/ML SOLN
INTRAVENOUS | Status: DC | PRN
Start: 2015-03-19 — End: 2015-03-19
  Administered 2015-03-19: 50 mL via INTRA_ARTERIAL
  Administered 2015-03-19: 100 mL via INTRA_ARTERIAL

## 2015-03-19 MED ORDER — MIDAZOLAM HCL 2 MG/2ML IJ SOLN
INTRAMUSCULAR | Status: AC
  Filled 2015-03-19: qty 2

## 2015-03-19 MED ORDER — SODIUM CHLORIDE 0.9 % IJ SOLN: 3.0000 mL | INTRAMUSCULAR | Status: DC | PRN

## 2015-03-19 MED ORDER — TICAGRELOR 90 MG PO TABS
ORAL_TABLET | ORAL | Status: DC | PRN
  Administered 2015-03-19: 180 mg via ORAL

## 2015-03-19 MED ORDER — MIDAZOLAM HCL 2 MG/2ML IJ SOLN
INTRAMUSCULAR | Status: DC | PRN
Start: 2015-03-19 — End: 2015-03-19
  Administered 2015-03-19 (×2): 1 mg via INTRAVENOUS

## 2015-03-19 MED ORDER — ALUM & MAG HYDROXIDE-SIMETH 200-200-20 MG/5ML PO SUSP: 30.0000 mL | Freq: Four times a day (QID) | ORAL | Status: DC | PRN

## 2015-03-19 SURGICAL SUPPLY — 21 items
BALLN TREK RX 2.5X12 (BALLOONS) ×4
BALLN ~~LOC~~ EMERGE MR 4.0X15 (BALLOONS) ×4
BALLOON TREK RX 2.5X12 (BALLOONS) ×2 IMPLANT
BALLOON ~~LOC~~ EMERGE MR 4.0X15 (BALLOONS) ×2 IMPLANT
CATH HEARTRAIL 6F IL3.5 (CATHETERS) ×4 IMPLANT
CATH INFINITI 5FR ANG PIGTAIL (CATHETERS) ×4 IMPLANT
CATH INFINITI 5FR MULTPACK ANG (CATHETERS) IMPLANT
CATH OPTITORQUE JACKY 4.0 5F (CATHETERS) ×4 IMPLANT
DEVICE RAD COMP TR BAND LRG (VASCULAR PRODUCTS) ×4 IMPLANT
GLIDESHEATH SLEND SS 6F .021 (SHEATH) ×4 IMPLANT
KIT ENCORE 26 ADVANTAGE (KITS) ×4 IMPLANT
KIT HEART LEFT (KITS) ×4 IMPLANT
PACK CARDIAC CATHETERIZATION (CUSTOM PROCEDURE TRAY) ×4 IMPLANT
SHEATH PINNACLE 5F 10CM (SHEATH) IMPLANT
STENT PROMUS PREM MR 3.5X20 (Permanent Stent) ×4 IMPLANT
SYR MEDRAD MARK V 150ML (SYRINGE) IMPLANT
TRANSDUCER W/STOPCOCK (MISCELLANEOUS) ×4 IMPLANT
TUBING CIL FLEX 10 FLL-RA (TUBING) ×4 IMPLANT
WIRE EMERALD 3MM-J .035X150CM (WIRE) IMPLANT
WIRE INTUITION HYDRO ST. 180CM (WIRE) ×4 IMPLANT
WIRE SAFE-T 1.5MM-J .035X260CM (WIRE) ×4 IMPLANT

## 2015-03-19 NOTE — Progress Notes (Signed)
Pt left for scheduled cath, VSS NAD, consents signed. Pt not coming back to step down unit, son is aware.

## 2015-03-19 NOTE — Progress Notes (Signed)
ANTICOAGULATION CONSULT NOTE - Initial Consult  Pharmacy Consult for Heparin  Indication: ACS Allergies  Allergen Reactions  . Ibuprofen Other (See Comments)    Kidney damage  . Nsaids     REACTION: nsaid induced nephropathy    Patient Measurements: Height: 5' 11.5" (181.6 cm) Weight: 194 lb 7.1 oz (88.2 kg) IBW/kg (Calculated) : 76.45 Heparin Dosing Weight: 83.9 kg  Vital Signs: Temp: 98.1 F (36.7 C) (05/11 1100) Temp Source: Oral (05/11 1100) BP: 113/73 mmHg (05/11 1100) Pulse Rate: 50 (05/11 1100)  Labs:  Recent Labs  03/17/15 1953 03/18/15 0238 03/18/15 0755 03/18/15 1439  03/18/15 2102 03/19/15 0230 03/19/15 1140  HGB 11.9* 12.4*  --   --   --   --  11.1*  --   HCT 36.9* 37.0*  --   --   --   --  33.5*  --   PLT 177 154  --   --   --   --  151  --   LABPROT  --  14.9  --   --   --   --   --   --   INR  --  1.16  --   --   --   --   --   --   HEPARINUNFRC  --   --  0.16*  --   < > 0.35 0.21* 0.46  CREATININE 1.63* 1.79* 1.69*  --   --   --  1.82*  --   TROPONINI  --  7.99* 4.73* 4.59*  --   --   --   --   < > = values in this interval not displayed.  Estimated Creatinine Clearance: 49 mL/min (by C-G formula based on Cr of 1.82).   Medical History: Past Medical History  Diagnosis Date  . Hypertensive cardiomyopathy   . Chronic kidney disease   . Atrial fibrillation     Isolated episode  . Hypertension     denies  . Dysrhythmia     atrial fib 11/13  . GERD (gastroesophageal reflux disease)     occ  . Arthritis   . TMJ arthralgia   . HOCM (hypertrophic obstructive cardiomyopathy)     Assessment: 54 year old male with a history of paroxysmal atrial fibrillation, hypertension, esophageal reflux, and hypertensive cardiomyopathy. He presents to the emergency department from urgent care for further evaluation of shortness of breath.  Critical troponin received from lab-- 10.33.  Pharmacy consulted to dose heparin for ACS/STEMI.  Pt not on  anticoagulation PTA.    Scr 1.69 > 1.82, Crcl ~45-50 mL/min Hgb 12.4 > 11.1, plt wnl, TSAT 4 low, Ferritin 55 not at goal.   Heparin level this PM is therapeutic at  0.46.  No reports of bleeding at this time.   Goal of Therapy:  Heparin level 0.3-0.7 units/ml Monitor platelets by anticoagulation protocol: Yes   Plan:  Continue Heparin drip at 1800 units/hr Follow up 6 hr heparin level  Daily heparin level and CBC Monitor for signs and symptoms of bleeding  Hassie Bruce, Pharm. D. Clinical Pharmacy Resident Pager: [phone] Ph: [phone] 03/19/2015 12:42 PM

## 2015-03-19 NOTE — Interval H&P Note (Signed)
History and Physical Interval Note:  03/19/2015 5:55 PM  Paul [name]  has presented today for surgery, with the diagnosis of cp  The various methods of treatment have been discussed with the patient and family. After consideration of risks, benefits and other options for treatment, the patient has consented to  Procedure(s): Left Heart Cath and Coronary Angiography (N/A) as a surgical intervention .  The patient's history has been reviewed, patient examined, no change in status, stable for surgery.  I have reviewed the patient's chart and labs.  Questions were answered to the patient's satisfaction.     [name]

## 2015-03-19 NOTE — Progress Notes (Signed)
Patient: Paul [name] / Admit Date: 03/17/2015 / Date of Encounter: 03/19/2015, 10:34 AM   Subjective: Feeling fine. SOB much improved. No chest pain.   Objective: Telemetry: NSR, SB, WAP intermittently, brief run of 3 PVCs then 8 beats of PAT yesterday Physical Exam: Blood pressure 135/83, pulse 50, temperature 97.7 F (36.5 C), temperature source Oral, resp. rate 19, height 5' 11.5" (1.816 m), weight 194 lb 7.1 oz (88.2 kg), SpO2 100 %. General: Well developed, well nourished AAM, in no acute distress. Head: Normocephalic, atraumatic, sclera non-icteric, no xanthomas, nares are without discharge. Neck:  JVP moderately elevated. Lungs: Clear bilaterally to auscultation except for mild intermittent wheezing. No rales or rhonchi. Breathing is unlabored. Heart: RRR S1 S2 without murmurs, rubs, or gallops.  Abdomen: Soft, non-tender, non-distended with normoactive bowel sounds. No rebound/guarding. Extremities: No clubbing or cyanosis. No edema. Distal pedal pulses are 2+ and equal bilaterally. Neuro: Alert and oriented X 3. Moves all extremities spontaneously. Psych:  Responds to questions appropriately with a normal affect.   Intake/Output Summary (Last 24 hours) at 03/19/15 1034 Last data filed at 03/19/15 1008  Gross per 24 hour  Intake 588.93 ml  Output   2200 ml  Net -1611.07 ml    Inpatient Medications:  . aspirin EC  81 mg Oral Daily  . atorvastatin  80 mg Oral q1800  . baclofen  10 mg Oral TID  . furosemide  40 mg Oral Daily  . loratadine  10 mg Oral Daily  . metolazone  5 mg Oral Once  . metoprolol tartrate  25 mg Oral BID  . montelukast  10 mg Oral QHS  . pantoprazole  40 mg Oral Q0600  . sodium chloride  3 mL Intravenous Q12H   Infusions:  . sodium chloride 10 mL/hr at 03/18/15 2000  . [START ON 03/20/2015] sodium chloride    . heparin 1,800 Units/hr (03/19/15 0432)    Labs:  Recent Labs  03/18/15 0238 03/18/15 0755 03/18/15 0900 03/19/15 0230  NA  140 141  --  141  K 3.7 4.2  --  3.9  CL 108 105  --  105  CO2 22 25  --  26  GLUCOSE 252* 155*  --  121*  BUN 13 17  --  27*  CREATININE 1.79* 1.69*  --  1.82*  CALCIUM 8.8* 8.8*  --  8.5*  MG 1.6*  --  2.2  --     Recent Labs  03/17/15 1953  AST 39  ALT 41  ALKPHOS 62  BILITOT 2.4*  PROT 6.6  ALBUMIN 3.7    Recent Labs  03/17/15 1953 03/18/15 0238 03/19/15 0230  WBC 11.0* 12.8* 14.8*  NEUTROABS 8.4*  --   --   HGB 11.9* 12.4* 11.1*  HCT 36.9* 37.0* 33.5*  MCV 78.5 76.4* 77.0*  PLT 177 154 151    Recent Labs  03/17/15 1933 03/18/15 0238 03/18/15 0755 03/18/15 1439  TROPONINI 10.33* 7.99* 4.73* 4.59*   Invalid input(s): POCBNP  Recent Labs  03/18/15 0238  HGBA1C 5.6     Radiology/Studies:  Dg Chest 1 View  03/18/2015   CLINICAL DATA:  CHF  EXAM: CHEST  1 VIEW  COMPARISON:  the previous day's study  FINDINGS: worsening diffuse airspace opacities bilaterally, right greater than left. mild cardiomegaly stable. blunting of lateral costophrenic angles suggesting small effusions. Visualized skeletal structures are unremarkable.  IMPRESSION: 1. worsening asymmetric infiltrates or edema.   Electronically Signed   By: Lucrezia Europe  M.D.   On: 03/18/2015 09:07   Dg Chest 2 View  03/17/2015   CLINICAL DATA:  Wheezing and shortness of breath for the past 2 days. Smoker.  EXAM: CHEST  2 VIEW  COMPARISON:  07/29/2014.  FINDINGS: Stable enlarged cardiac silhouette and tortuous aorta. Interval patchy airspace opacity in both lungs. No pleural fluid seen. Lower thoracic spine degenerative changes.  IMPRESSION: 1. Interval patchy pneumonia in both lungs. Alveolar edema is less likely. 2. Stable cardiomegaly.   Electronically Signed   By: Claudie Revering M.D.   On: 03/17/2015 18:11     Assessment and Plan  37M with h/o HTN, hypertensive cardiomyopathy, CKD admitted with chest tightness, orthopnea, PND, increasing abdominal girth. CXR diagnosed as PNA, sent to [facility] with initial  troponin of 10. Felt to have a/c dCHF.  1. Acute on chronic diastolic CHF - prior echo with dynamic obstruction and mid-cavity obliteration. Baseline weight 185-188 at home, as low as 174 in Sept. Admit weight 197, down to 194 after diuresis (-7.2L) thus far. 2D echo 03/18/15: severe LV wall thickening with basal septal hypertrophy with bright, speckled appearance of the intraventricular septum which may be consistent with disease such as amyloidosis. No LVOT obstruction is noted. EF 65-70%, grade 2 DD, elevated LV filling pressure, mild MR, massively dilated RA/LA, aneurysmal intra-atrial septum (PFO cannot be excluded), mod TR, elevated PASP 12mmHg, elevated CVP, aortic sclerosis without stenosis. Check SPEP, UPEP. Will need to consider cardiac MRI or fat pad bx this admission. Check UA to evaluate for proteinuria. F/u LFTs in AM. He received Lasix this AM. Will d/c further doses so we can ensure stability of Cr post-cath.  2. NSTEMI - peak troponin 10 on admission, trending down. Cotninue ASA, BB, statin. Dr. Irish Lack feels Cr stable enough for cath today. Risks and benefits of cardiac catheterization have been discussed with the patient.  These include bleeding, infection, kidney damage, stroke, heart attack, death.  The patient understands these risks and is willing to proceed.  3. Leukocytosis - received steroids on 03/17/15 for empiric rx of COPD exacerbation but WBC WBC continuing to go up. CXR raises question of edema > infiltrates. Afebrile. Check strep pneumo, legionella, sputum cx. Will follow clinically for now.  4. CKD stage III - baseline Cr appears 1.5-1.9.  Currently slightly higher than admission. Follow closely. Avoid/limit nephrotoxic agents as above.  5. Essential HTN - controlled, follow.  6. Tobacco abuse - counseled regarding cessation. Mild wheezing on exam, may need to d/c BB if this persists.  7. Hypomagnesemia - resolved. F/u in AM to see if MagOx needed.  8. Ectopic atrial  rhythm: intermittent WAP on telemetry. Intermittent PR narrowing. Watch HR on tele with sinus bradycardia.  9. Mild microcytic anemia - generally stable since admission. Iron and % sat low but ferritin normal. Hemoccult stool. Can consider adding iron.   Signed, Melina Copa PA-C Pager: [phone]   I have examined the patient and reviewed assessment and plan and discussed with patient.  Agree with above as stated.  Diagnostic cath today.  No Vgram given renal insufficiency.  Able to lie flat.  Baseline Cr 1.6-1.9 per the patient.  He is within range.  Also, considering possibility of amyloid based on echo.  Check SPEP UPEP.  Further plans based on cath result.   Jasiya Markie S.

## 2015-03-19 NOTE — Progress Notes (Signed)
CARDIAC REHAB PHASE I   PRE:  Rate/Rhythm: 17 SB c/ PVCs  BP:  Sitting: 128/80        SaO2: 100 RA  MODE:  Ambulation: 700 ft   POST:  Rate/Rhythm: 63 SR  BP:  Sitting: 140/84         SaO2: 100 RA  Pt ambulated 700 ft on RA, 1x assist, IV, tolerated well.  Pt has unsteady gait at baseline d/t "hip surgeries," declined use of walker although he states he has a cane and walker at home if he needs them. Pt states he needs to have another hip surgery and plans to do so in a "few months." Pt initially denied DOE, even though visibly short of breath.  Upon returning to room pt did repot mild DOE, improved since admission. Gave pt MI book, pt states "I may not have had a heart attack." Pt did agree to watch MI video.  Gave tobacco cessation information. Pt verbalized understanding.  Will follow after cath.   [phone]   Lenna Sciara, RN, BSN 03/19/2015 11:44 AM

## 2015-03-19 NOTE — H&P (View-Only) (Signed)
Patient: Paul [name] / Admit Date: 03/17/2015 / Date of Encounter: 03/19/2015, 10:34 AM   Subjective: Feeling fine. SOB much improved. No chest pain.   Objective: Telemetry: NSR, SB, WAP intermittently, brief run of 3 PVCs then 8 beats of PAT yesterday Physical Exam: Blood pressure 135/83, pulse 50, temperature 97.7 F (36.5 C), temperature source Oral, resp. rate 19, height 5' 11.5" (1.816 m), weight 194 lb 7.1 oz (88.2 kg), SpO2 100 %. General: Well developed, well nourished AAM, in no acute distress. Head: Normocephalic, atraumatic, sclera non-icteric, no xanthomas, nares are without discharge. Neck:  JVP moderately elevated. Lungs: Clear bilaterally to auscultation except for mild intermittent wheezing. No rales or rhonchi. Breathing is unlabored. Heart: RRR S1 S2 without murmurs, rubs, or gallops.  Abdomen: Soft, non-tender, non-distended with normoactive bowel sounds. No rebound/guarding. Extremities: No clubbing or cyanosis. No edema. Distal pedal pulses are 2+ and equal bilaterally. Neuro: Alert and oriented X 3. Moves all extremities spontaneously. Psych:  Responds to questions appropriately with a normal affect.   Intake/Output Summary (Last 24 hours) at 03/19/15 1034 Last data filed at 03/19/15 1008  Gross per 24 hour  Intake 588.93 ml  Output   2200 ml  Net -1611.07 ml    Inpatient Medications:  . aspirin EC  81 mg Oral Daily  . atorvastatin  80 mg Oral q1800  . baclofen  10 mg Oral TID  . furosemide  40 mg Oral Daily  . loratadine  10 mg Oral Daily  . metolazone  5 mg Oral Once  . metoprolol tartrate  25 mg Oral BID  . montelukast  10 mg Oral QHS  . pantoprazole  40 mg Oral Q0600  . sodium chloride  3 mL Intravenous Q12H   Infusions:  . sodium chloride 10 mL/hr at 03/18/15 2000  . [START ON 03/20/2015] sodium chloride    . heparin 1,800 Units/hr (03/19/15 0432)    Labs:  Recent Labs  03/18/15 0238 03/18/15 0755 03/18/15 0900 03/19/15 0230  NA  140 141  --  141  K 3.7 4.2  --  3.9  CL 108 105  --  105  CO2 22 25  --  26  GLUCOSE 252* 155*  --  121*  BUN 13 17  --  27*  CREATININE 1.79* 1.69*  --  1.82*  CALCIUM 8.8* 8.8*  --  8.5*  MG 1.6*  --  2.2  --     Recent Labs  03/17/15 1953  AST 39  ALT 41  ALKPHOS 62  BILITOT 2.4*  PROT 6.6  ALBUMIN 3.7    Recent Labs  03/17/15 1953 03/18/15 0238 03/19/15 0230  WBC 11.0* 12.8* 14.8*  NEUTROABS 8.4*  --   --   HGB 11.9* 12.4* 11.1*  HCT 36.9* 37.0* 33.5*  MCV 78.5 76.4* 77.0*  PLT 177 154 151    Recent Labs  03/17/15 1933 03/18/15 0238 03/18/15 0755 03/18/15 1439  TROPONINI 10.33* 7.99* 4.73* 4.59*   Invalid input(s): POCBNP  Recent Labs  03/18/15 0238  HGBA1C 5.6     Radiology/Studies:  Dg Chest 1 View  03/18/2015   CLINICAL DATA:  CHF  EXAM: CHEST  1 VIEW  COMPARISON:  the previous day's study  FINDINGS: worsening diffuse airspace opacities bilaterally, right greater than left. mild cardiomegaly stable. blunting of lateral costophrenic angles suggesting small effusions. Visualized skeletal structures are unremarkable.  IMPRESSION: 1. worsening asymmetric infiltrates or edema.   Electronically Signed   By: Corlis Leak  M.D.   On: 03/18/2015 09:07   Dg Chest 2 View  03/17/2015   CLINICAL DATA:  Wheezing and shortness of breath for the past 2 days. Smoker.  EXAM: CHEST  2 VIEW  COMPARISON:  07/29/2014.  FINDINGS: Stable enlarged cardiac silhouette and tortuous aorta. Interval patchy airspace opacity in both lungs. No pleural fluid seen. Lower thoracic spine degenerative changes.  IMPRESSION: 1. Interval patchy pneumonia in both lungs. Alveolar edema is less likely. 2. Stable cardiomegaly.   Electronically Signed   By: Claudie Revering M.D.   On: 03/17/2015 18:11     Assessment and Plan  26M with h/o HTN, hypertensive cardiomyopathy, CKD admitted with chest tightness, orthopnea, PND, increasing abdominal girth. CXR diagnosed as PNA, sent to [facility] with initial  troponin of 10. Felt to have a/c dCHF.  1. Acute on chronic diastolic CHF - prior echo with dynamic obstruction and mid-cavity obliteration. Baseline weight 185-188 at home, as low as 174 in Sept. Admit weight 197, down to 194 after diuresis (-7.2L) thus far. 2D echo 03/18/15: severe LV wall thickening with basal septal hypertrophy with bright, speckled appearance of the intraventricular septum which may be consistent with disease such as amyloidosis. No LVOT obstruction is noted. EF 65-70%, grade 2 DD, elevated LV filling pressure, mild MR, massively dilated RA/LA, aneurysmal intra-atrial septum (PFO cannot be excluded), mod TR, elevated PASP 38mmHg, elevated CVP, aortic sclerosis without stenosis. Check SPEP, UPEP. Will need to consider cardiac MRI or fat pad bx this admission. Check UA to evaluate for proteinuria. F/u LFTs in AM. He received Lasix this AM. Will d/c further doses so we can ensure stability of Cr post-cath.  2. NSTEMI - peak troponin 10 on admission, trending down. Cotninue ASA, BB, statin. Dr. Irish Lack feels Cr stable enough for cath today. Risks and benefits of cardiac catheterization have been discussed with the patient.  These include bleeding, infection, kidney damage, stroke, heart attack, death.  The patient understands these risks and is willing to proceed.  3. Leukocytosis - received steroids on 03/17/15 for empiric rx of COPD exacerbation but WBC WBC continuing to go up. CXR raises question of edema > infiltrates. Afebrile. Check strep pneumo, legionella, sputum cx. Will follow clinically for now.  4. CKD stage III - baseline Cr appears 1.5-1.9.  Currently slightly higher than admission. Follow closely. Avoid/limit nephrotoxic agents as above.  5. Essential HTN - controlled, follow.  6. Tobacco abuse - counseled regarding cessation. Mild wheezing on exam, may need to d/c BB if this persists.  7. Hypomagnesemia - resolved. F/u in AM to see if MagOx needed.  8. Ectopic atrial  rhythm: intermittent WAP on telemetry. Intermittent PR narrowing. Watch HR on tele with sinus bradycardia.  9. Mild microcytic anemia - generally stable since admission. Iron and % sat low but ferritin normal. Hemoccult stool. Can consider adding iron.   Signed, Melina Copa PA-C Pager: [phone]   I have examined the patient and reviewed assessment and plan and discussed with patient.  Agree with above as stated.  Diagnostic cath today.  No Vgram given renal insufficiency.  Able to lie flat.  Baseline Cr 1.6-1.9 per the patient.  He is within range.  Also, considering possibility of amyloid based on echo.  Check SPEP UPEP.  Further plans based on cath result.   VARANASI,JAYADEEP S.

## 2015-03-19 NOTE — Progress Notes (Signed)
ANTICOAGULATION CONSULT NOTE - Follow Up Consult  Pharmacy Consult for heparin Indication: NSTEMI   Labs:  Recent Labs  03/17/15 1953 03/18/15 0238  03/18/15 0755 03/18/15 1439 03/18/15 1510 03/18/15 2102 03/19/15 0230  HGB 11.9* 12.4*  --   --   --   --   --  11.1*  HCT 36.9* 37.0*  --   --   --   --   --  33.5*  PLT 177 154  --   --   --   --   --  151  LABPROT  --  14.9  --   --   --   --   --   --   INR  --  1.16  --   --   --   --   --   --   HEPARINUNFRC  --   --   < > 0.16*  --  0.41 0.35 0.21*  CREATININE 1.63* 1.79*  --  1.69*  --   --   --  1.82*  TROPONINI  --  7.99*  --  4.73* 4.59*  --   --   --   < > = values in this interval not displayed.    Assessment: 54yo now subtherapeutic on heparin after two levels at goal though had been trending down.  Goal of Therapy:  Heparin level 0.3-0.7 units/ml   Plan:  Will increase heparin gtt by ~2 units/kg/hr to 1800 units/hr and check level in [place], PharmD, BCPS  03/19/2015,4:33 AM

## 2015-03-20 ENCOUNTER — Telehealth: Payer: Self-pay | Admitting: Nurse Practitioner

## 2015-03-20 ENCOUNTER — Other Ambulatory Visit: Payer: Self-pay | Admitting: Nurse Practitioner

## 2015-03-20 ENCOUNTER — Encounter (HOSPITAL_COMMUNITY): Payer: Self-pay | Admitting: Cardiovascular Disease

## 2015-03-20 DIAGNOSIS — N183 Chronic kidney disease, stage 3 unspecified: Secondary | ICD-10-CM

## 2015-03-20 DIAGNOSIS — Z955 Presence of coronary angioplasty implant and graft: Secondary | ICD-10-CM | POA: Insufficient documentation

## 2015-03-20 DIAGNOSIS — I214 Non-ST elevation (NSTEMI) myocardial infarction: Secondary | ICD-10-CM | POA: Diagnosis not present

## 2015-03-20 LAB — PROTEIN ELECTROPHORESIS, SERUM
A/G Ratio: 1.4 (ref 0.7–2.0)
Albumin ELP: 3.6 g/dL (ref 3.2–5.6)
Alpha-1-Globulin: 0.3 g/dL (ref 0.1–0.4)
Alpha-2-Globulin: 0.7 g/dL (ref 0.4–1.2)
BETA GLOBULIN: 1.1 g/dL (ref 0.6–1.3)
GAMMA GLOBULIN: 0.6 g/dL (ref 0.5–1.6)
Globulin, Total: 2.6 g/dL (ref 2.0–4.5)
Total Protein ELP: 6.2 g/dL (ref 6.0–8.5)

## 2015-03-20 LAB — BASIC METABOLIC PANEL
Anion gap: 10 (ref 5–15)
BUN: 25 mg/dL — ABNORMAL HIGH (ref 6–20)
CHLORIDE: 106 mmol/L (ref 101–111)
CO2: 25 mmol/L (ref 22–32)
Calcium: 8.5 mg/dL — ABNORMAL LOW (ref 8.9–10.3)
Creatinine, Ser: 1.91 mg/dL — ABNORMAL HIGH (ref 0.61–1.24)
GFR calc Af Amer: 44 mL/min — ABNORMAL LOW (ref >60)
GFR calc non Af Amer: 38 mL/min — ABNORMAL LOW (ref >60)
GLUCOSE: 142 mg/dL — AB (ref 65–99)
POTASSIUM: 3.7 mmol/L (ref 3.5–5.1)
SODIUM: 141 mmol/L (ref 135–145)

## 2015-03-20 LAB — CBC
HCT: 35 % — ABNORMAL LOW (ref 39.0–52.0)
HEMOGLOBIN: 11.3 g/dL — AB (ref 13.0–17.0)
MCH: 25.1 pg — AB (ref 26.0–34.0)
MCHC: 32.3 g/dL (ref 30.0–36.0)
MCV: 77.8 fL — ABNORMAL LOW (ref 78.0–100.0)
Platelets: 167 10*3/uL (ref 150–400)
RBC: 4.5 MIL/uL (ref 4.22–5.81)
RDW: 14.2 % (ref 11.5–15.5)
WBC: 10.6 10*3/uL — AB (ref 4.0–10.5)

## 2015-03-20 LAB — LEGIONELLA ANTIGEN, URINE

## 2015-03-20 LAB — VITAMIN D 25 HYDROXY (VIT D DEFICIENCY, FRACTURES): Vit D, 25-Hydroxy: 11.3 ng/mL — ABNORMAL LOW (ref 30.0–100.0)

## 2015-03-20 LAB — MAGNESIUM: Magnesium: 1.8 mg/dL (ref 1.7–2.4)

## 2015-03-20 MED ORDER — METOPROLOL TARTRATE 25 MG PO TABS: 25.0000 mg | ORAL_TABLET | Freq: Two times a day (BID) | ORAL | Status: DC

## 2015-03-20 MED ORDER — NITROGLYCERIN 0.4 MG SL SUBL: 0.4000 mg | SUBLINGUAL_TABLET | SUBLINGUAL | Status: DC | PRN

## 2015-03-20 MED ORDER — PNEUMOCOCCAL VAC POLYVALENT 25 MCG/0.5ML IJ INJ
0.5000 mL | INJECTION | Freq: Once | INTRAMUSCULAR | Status: AC
  Administered 2015-03-20: 15:00:00 0.5 mL via INTRAMUSCULAR
  Filled 2015-03-20: qty 0.5

## 2015-03-20 MED ORDER — TICAGRELOR 90 MG PO TABS: 90.0000 mg | ORAL_TABLET | Freq: Two times a day (BID) | ORAL | Status: DC

## 2015-03-20 MED ORDER — FUROSEMIDE 40 MG PO TABS: 40.0000 mg | ORAL_TABLET | Freq: Every day | ORAL | Status: DC

## 2015-03-20 MED ORDER — POTASSIUM CHLORIDE ER 10 MEQ PO TBCR
10.0000 meq | EXTENDED_RELEASE_TABLET | Freq: Every day | ORAL | Status: DC
Start: 2015-03-20 — End: 2015-03-27

## 2015-03-20 MED ORDER — ASPIRIN 81 MG PO TBEC: 81.0000 mg | DELAYED_RELEASE_TABLET | Freq: Every day | ORAL | Status: DC

## 2015-03-20 MED ORDER — ATORVASTATIN CALCIUM 80 MG PO TABS: 80.0000 mg | ORAL_TABLET | Freq: Every day | ORAL | Status: DC

## 2015-03-20 MED FILL — Lidocaine HCl Local Preservative Free (PF) Inj 1%: INTRAMUSCULAR | Qty: 30 | Status: AC

## 2015-03-20 MED FILL — Heparin Sodium (Porcine) 2 Unit/ML in Sodium Chloride 0.9%: INTRAMUSCULAR | Qty: 1000 | Status: AC

## 2015-03-20 NOTE — Progress Notes (Signed)
Patient Name: Paul [name] Date of Encounter: 03/20/2015     Principal Problem:   Acute diastolic CHF (congestive heart failure), NYHA class 4 Active Problems:   Hypertrophic obstructive cardiomyopathy   NSTEMI (non-ST elevated myocardial infarction)   TOBACCO USE   Essential hypertension   CKD (chronic kidney disease), stage III   Hyperlipidemia   GERD   Microcytic anemia   Wandering (atrial) pacemaker   Hypomagnesemia   Leukocytosis   SUBJECTIVE  No c/p or dyspnea overnight.  Eager to go home.  CURRENT MEDS . aspirin EC  81 mg Oral Daily  . atorvastatin  80 mg Oral q1800  . baclofen  10 mg Oral TID  . loratadine  10 mg Oral Daily  . metoprolol tartrate  25 mg Oral BID  . montelukast  10 mg Oral QHS  . pantoprazole  40 mg Oral Q0600  . sodium chloride  3 mL Intravenous Q12H  . ticagrelor  90 mg Oral BID    OBJECTIVE  Filed Vitals:   03/19/15 1855 03/19/15 1926 03/20/15 0100 03/20/15 0643  BP: 126/81 153/82 132/78 139/86  Pulse: 53 59 53 56  Temp:  98.1 F (36.7 C) 97.6 F (36.4 C) 98.4 F (36.9 C)  TempSrc:  Oral Oral Oral  Resp: $Remo'12 24 16 20  'iBaTS$ Height:  $Remove'5\' 11"'PDSncMD$  (1.803 m)    Weight:  194 lb 7.1 oz (88.2 kg) 194 lb 7.1 oz (88.2 kg)   SpO2: 0% 97% 96% 97%    Intake/Output Summary (Last 24 hours) at 03/20/15 0744 Last data filed at 03/20/15 0700  Gross per 24 hour  Intake   1317 ml  Output   3300 ml  Net  -1983 ml   Filed Weights   03/19/15 0400 03/19/15 1926 03/20/15 0100  Weight: 194 lb 7.1 oz (88.2 kg) 194 lb 7.1 oz (88.2 kg) 194 lb 7.1 oz (88.2 kg)    PHYSICAL EXAM  General: Pleasant, NAD. Neuro: Alert and oriented X 3. Moves all extremities spontaneously. Psych: Normal affect. HEENT:  Normal  Neck: Supple without bruits or JVD. Lungs:  Resp regular and unlabored, CTA. Heart: RRR no s3, s4, or murmurs. Abdomen: Soft, non-tender, non-distended, BS + x 4.  Extremities: No clubbing, cyanosis or edema. DP/PT/Radials 2+ and equal  bilaterally. R wrist cath site w/o bleeding/bruit/hematoma.  Accessory Clinical Findings  CBC  Recent Labs  03/17/15 1953  03/19/15 0230 03/20/15 0324  WBC 11.0*  < > 14.8* 10.6*  NEUTROABS 8.4*  --   --   --   HGB 11.9*  < > 11.1* 11.3*  HCT 36.9*  < > 33.5* 35.0*  MCV 78.5  < > 77.0* 77.8*  PLT 177  < > 151 167  < > = values in this interval not displayed. Basic Metabolic Panel  Recent Labs  03/18/15 0900 03/19/15 0230 03/20/15 0324  NA  --  141 141  K  --  3.9 3.7  CL  --  105 106  CO2  --  26 25  GLUCOSE  --  121* 142*  BUN  --  27* 25*  CREATININE  --  1.82* 1.91*  CALCIUM  --  8.5* 8.5*  MG 2.2  --  1.8   Liver Function Tests  Recent Labs  03/17/15 1953  AST 39  ALT 41  ALKPHOS 62  BILITOT 2.4*  PROT 6.6  ALBUMIN 3.7   Cardiac Enzymes  Recent Labs  03/18/15 0238 03/18/15 0755 03/18/15 1439  TROPONINI  7.99* 4.73* 4.59*   Hemoglobin A1C  Recent Labs  03/18/15 0238  HGBA1C 5.6   Fasting Lipid Panel  Recent Labs  03/18/15 0238  CHOL 162  HDL 60  LDLCALC 94  TRIG 41  CHOLHDL 2.7   Thyroid Function Tests  Recent Labs  03/18/15 0238  TSH 0.401    TELE  Wandering atrial pacemaker with intermittent pr narrowing.  ECG  Sb, 58, lvh with repol abnormalities, poor r progression. No acute st/t changes.  Radiology/Studies  Dg Chest 1 View  03/18/2015   CLINICAL DATA:  CHF  EXAM: CHEST  1 VIEW  COMPARISON:  the previous day's study  FINDINGS: worsening diffuse airspace opacities bilaterally, right greater than left. mild cardiomegaly stable. blunting of lateral costophrenic angles suggesting small effusions. Visualized skeletal structures are unremarkable.  IMPRESSION: 1. worsening asymmetric infiltrates or edema.   Electronically Signed   By: Corlis Leak M.D.   On: 03/18/2015 09:07   Dg Chest 2 View  03/17/2015   CLINICAL DATA:  Wheezing and shortness of breath for the past 2 days. Smoker.  EXAM: CHEST  2 VIEW  COMPARISON:   07/29/2014.  FINDINGS: Stable enlarged cardiac silhouette and tortuous aorta. Interval patchy airspace opacity in both lungs. No pleural fluid seen. Lower thoracic spine degenerative changes.  IMPRESSION: 1. Interval patchy pneumonia in both lungs. Alveolar edema is less likely. 2. Stable cardiomegaly.   Electronically Signed   By: Beckie Salts M.D.   On: 03/17/2015 18:11    ASSESSMENT AND PLAN  1.  Acute on chronic diastolic CHF/HOCM:  Known HOCM by prior echo.  2D echo 03/18/15: severe LV wall thickening with basal septal hypertrophy with bright, speckled appearance of the intraventricular septum which may be consistent with disease such as amyloidosis. No LVOT obstruction noted. EF 65-70%, grade 2 DD, elevated LV filling pressure, mild MR, massively dilated RA/LA, aneurysmal intra-atrial septum (PFO cannot be excluded), mod TR, elevated PASP , elevated CVP, aortic sclerosis without stenosis. Euvolemic on exam.  Minus 9.2 L for admission. Wt stable @ 194.  Wt prev as low as 176 lbs but more sedentary since then in setting of hip surgery.  EDP mildly elevated @ 14 mmHg on cath 5/11.  Concern on echo re: infiltrative cardiac dzs with rec for MRI.  SPEP, UPEP ordered. UA neg for protein.  Will need to consider cardiac MRI - can likely be done as outpt. HR/BP stable on bb.  2.  NSTEMI:  Trop on presentation elevated @ 10.33.  s/p PCI/DES to the LAD on 5/11. Residual severe lcx dzs with plan for staged PCI in a few wks.  Cont asa, statin, bb.  No acei/arb in setting of acute on chronic stage III kidney dzs.  Cardiac rehab to see this AM.  3.  Essential HTN:  stable on bb.  4.  CKD III:  creat up slightly to 1.91 after limited contrast yesterday.  He remains on IVF for the time being.   5.  Tob Abuse:  Complete cessation advised.  6.  Hyperglycemia:  A1c 5.6.  7.  Leukocytosis:  In setting of recent steroid usage.  Afebrile. Trending down. UA neg.  Initial cxr read out as pna however he has had  clinical improvement with diuresis. F/U cxr with persistent infiltrate - more likely edema.  8. Wandering atrial pacemaker:  Intermittent PR narrowing.  Cont bb.  Follow.   Signed, Nicolasa Ducking NP  I have examined the patient and reviewed assessment and plan  and discussed with patient.  Agree with above as stated.   Stressed importance of DAPT.  Recommended cardiac rehab after PCI of left circ in a few weeks. Low dose lasix given HOCM.  WIll need OP cardiac MRI after some time from the nephrotoxins.  Jannat Rosemeyer S.

## 2015-03-20 NOTE — Telephone Encounter (Signed)
New message      TCM appt on 03-27-15 per Ignacia Bayley

## 2015-03-20 NOTE — Care Management Note (Addendum)
Case Management Note  Patient Details  Name: DARREK LEASURE MRN: [id number] Date of Birth: Jun 01, 1961          Expected Discharge Date:         Expected Discharge Plan:     In-House Referral:     Discharge planning Services     Post Acute Care Choice:    Choice offered to:     DME Arranged:    DME Agency:     HH Arranged:    [place] Agency:     Status of Service:     Medicare Important Message Given:    Date Medicare IM Given:    Medicare IM give by:    Date Additional Medicare IM Given:    Additional Medicare Important Message give by:     If discussed at [place] of Stay Meetings, dates discussed:    Additional Comments: CM gave pt pamphlet for Brilinta with 30 day free card and copay card. Pt stated uses [facility] ([place]), [phone],  CM confirmed drug is instock. CM will f/u with d/c needs. Benefits  Check: Kary Kos is covered. No Prior Authorization required. Patient's retail pharmacy co-payment will be $7.40. [employer]'s preferred retail pharmacies are [facility] and [facility] . Benefits check  shared with pt by CM.          Whitman Hero [place], RN 03/20/2015, 9:22 AM

## 2015-03-20 NOTE — Progress Notes (Signed)
CARDIAC REHAB PHASE I   PRE:  Rate/Rhythm: 10 SR  BP:  Sitting: 132/70         SaO2: 100 RA  MODE:  Ambulation: 500 ft   POST:  Rate/Rhythm: 72 SR  BP:  Sitting: 174/87         SaO2: 100 RA  Pt ambulated 500 ft on RA, independent, unsteady gait at baseline, tolerated well.  Pt denies DOE, cp, dizziness, declined rest stop. Pt does appear short on breath on exertion still. Completed MI/stent/CHF education. Reviewed anti-platelet therapy, stent card, activity restrictions, ntg, exercise, heart healthy diet, sodium and fluid restrictions, daily weights, phase 2 cardiac rehab. Pt verbalized understanding, does seem anxious and has limited acceptance/understanding of disease process/diagnoses. Pt states " I didn't have high blood pressure or high cholesterol before coming into the hospital." Pt does agree to phase 2 cardiac rehab. Will send referral to [facility]. Per MD note, pt will return in a few weeks for staged PCI.   [phone]   Lenna Sciara, RN, BSN 03/20/2015 10:25 AM

## 2015-03-20 NOTE — Discharge Summary (Signed)
Discharge Summary   Patient ID: HRIDAY STAI,  MRN: [id number], DOB/AGE: [birth year] 56 y.o.  Admit date: 03/17/2015 Discharge date: 03/20/2015  Primary Care Provider: Beverlyn Roux Primary Cardiologist: Lendell Caprice, MD   Discharge Diagnoses Principal Problem:   Acute diastolic CHF (congestive heart failure), NYHA class 4  **Minus 9.4 L with 3 pound weight loss.  **Discharge weight 194 pounds.  Active Problems:   Hypertrophic obstructive cardiomyopathy  **Normal LV function by echocardiogram with bright speckled appearance of the intraventricular septum on echocardiogram concerning for possible infiltrative process.   **Plan for outpatient cardiac MRI to better evaluate in the future.   NSTEMI (non-ST elevated myocardial infarction)/CAD  **status post successful PCI drug eluting stent to LAD this admission.  **Patient has residual left circumflex disease with plan for staged percutaneous intervention in a few weeks.   TOBACCO USE   Essential hypertension   CKD (chronic kidney disease), stage III   Hyperlipidemia   GERD   Microcytic anemia   Wandering (atrial) pacemaker   Hypomagnesemia   Leukocytosis  Allergies Allergies  Allergen Reactions  . Ibuprofen Other (See Comments)    Kidney damage  . Nsaids     REACTION: nsaid induced nephropathy   Procedures  2D Echocardiogram 5.10.2016 Study Conclusions  - Left ventricle: Severe LV wall thickening with basal septal   hypertrophy - measuring 2.0 cm. There is a bright, speckled   appearance of the intraventricular septum which may be consistent   with infiltrative disease such as amyloidosis. No LVOT   obstruction is noted. Systolic function was vigorous. The   estimated ejection fraction was in the range of 65% to 70%. Wall   motion was normal; there were no regional wall motion   abnormalities. Doppler parameters are consistent with pseudormal   left ventricular relaxation (grade 2 diastolic dysfunction). The   E/A  ratio is 2.5. The E/e&' ratio is >15, suggesting elevated LV   filling pressure. - Aortic valve: Sclerosis without stenosis. Transvalvular velocity   was minimally increased. There was trivial regurgitation. Valve   area (VTI): 3.14 cm^2. Valve area (Vmax): 3.11 cm^2. Valve area   (Vmean): 3.21 cm^2. - Mitral valve: There was mild regurgitation. - Left atrium: Massively dilated at 77 ml/m2. - Right atrium: Massively dilated at 48 ml/m2. - Atrial septum: Aneurysmal IAS - PFO cannot be excluded. - Tricuspid valve: There was moderate regurgitation. - Pulmonary arteries: PA peak pressure: 56 mm Hg (S). - Inferior vena cava: The vessel was dilated. The respirophasic   diameter changes were blunted (< 50%), consistent with elevated   central venous pressure.   **Compared to the prior study in 2013, the LV wall thickness has   progressed - there is a suggestion of infiltrative   cardiomyopathy. Consider cardiac MRI to evaluate further. There   is now severe biatrial enlargment. There is pseudonormal   diastolic dysfunction with high LV filling pressure. RA pressure   is also elevated. The interatrial septum is aneurysmal and a PFO   cannot be excluded.** _____________   Cardiac Catheterization and Percutaneous Coronary Intervention 2.11.2016  Coronary Findings     Dominance: Right    Left Main  The vessel is angiographically normal.      Left Anterior Descending   . Ost LAD to Prox LAD lesion, 95% stenosed. The lesion is type C, tubular located at major branch. The lesion was not previously treated.   . **The LAD was successfully stented using a 3.5 x 20 mm Promus Premier  DES.**     . First Diagonal Branch   . Ost 1st Diag to 1st Diag lesion, 60% stenosed. discrete .   Marland Kitchen Second Diagonal Branch   There is mild.   . Third Diagonal Branch   There is mild.      Ramus Intermedius  The vessel , is large . The vessel exhibits minimal luminal irregularities.      Left Circumflex   .  Mid Cx lesion, 90% stenosed. discrete .       **Plan for staged PCI.**   . First Obtuse Marginal Branch   The vessel is small in size and exhibits minimal luminal irregularities.   . Second Obtuse Marginal Branch   The vessel is small in size and exhibits minimal luminal irregularities.      Right Coronary Artery  There is mildthe vessel.   . Prox RCA lesion, 20% stenosed.   . Right Posterior Descending Artery   . RPDA lesion, 60% stenosed.    _____________   History of Present Illness  54 year old male with a prior history of hypertrophic obstructive cardiomyopathy and stage III chronic kidney disease who presented to [name] on the evening of May 9 with complaints of chest pressure, tightness, and dyspnea. Troponin was found to be 10.33. Chest x-ray showed infiltrate versus edema. He was felt to be markedly volume overloaded on exam. He was treated with IV Lasix and admitted for further evaluation.  Hospital Course  Patient had marked improvement with IV diuresis. For this admission, he is minus 9.4L and his wt is down 3 lbs to 194 lbs at discharge.  Dyspnea resolved completely and he had no further chest pain. Subsequent troponin values trended in a downward fashion. It was felt that he would require diagnostic cardiac catheterization however, given chronic kidney disease and admission with volume overload, an echocardiogram was evaluated first. This showed normal LV function with severe LVH as well as severe biatrial enlargement. There was a bright, speckled appearance of the intraventricular septum concerning for an infiltrative process. He also had grade 2 diastolic dysfunction. Cardiac MRI was recommended to further evaluate for possible infiltrative process. SPEP and UPEP were sent off and are currently pending. Urinalysis was negative for proteinuria.  Patient remained stable without recurrent chest pain or dyspnea. He was able to lie flat and creatinine remain stable. We therefore  proceeded with diagnostic cardiac catheterization on May 11. This revealed severe proximal LAD and also mid left circumflex stenosis. The LAD was successfully stented using a 3.5 x 20 mm Promus Premier drug eluting stent. An effort to limit contrast exposure, a decision was made to perform staged PCI within the left circumflex in a couple of weeks. Postprocedure, Mr. Carswell has had no further chest pain or dyspnea. He has been ambulating without difficulty and will be discharged home today on aspirin, brilinta, beta blocker, and high potency statin therapy. We have arranged for follow-up within 1 week and a basic metabolic panel in 4 days.  Discharge Vitals Blood pressure 132/70, pulse 62, temperature 98.4 F (36.9 C), temperature source Oral, resp. rate 20, height $RemoveBe'5\' 11"'pwauIlhza$  (1.803 m), weight 194 lb 7.1 oz (88.2 kg), SpO2 94 %.  Filed Weights   03/19/15 0400 03/19/15 1926 03/20/15 0100  Weight: 194 lb 7.1 oz (88.2 kg) 194 lb 7.1 oz (88.2 kg) 194 lb 7.1 oz (88.2 kg)    Labs  CBC  Recent Labs  03/17/15 1953  03/19/15 0230 03/20/15 0324  WBC 11.0*  < >  14.8* 10.6*  NEUTROABS 8.4*  --   --   --   HGB 11.9*  < > 11.1* 11.3*  HCT 36.9*  < > 33.5* 35.0*  MCV 78.5  < > 77.0* 77.8*  PLT 177  < > 151 167  < > = values in this interval not displayed. Basic Metabolic Panel  Recent Labs  03/18/15 0900 03/19/15 0230 03/20/15 0324  NA  --  141 141  K  --  3.9 3.7  CL  --  105 106  CO2  --  26 25  GLUCOSE  --  121* 142*  BUN  --  27* 25*  CREATININE  --  1.82* 1.91*  CALCIUM  --  8.5* 8.5*  MG 2.2  --  1.8   Liver Function Tests  Recent Labs  03/17/15 1953  AST 39  ALT 41  ALKPHOS 62  BILITOT 2.4*  PROT 6.6  ALBUMIN 3.7   Cardiac Enzymes  Recent Labs  03/18/15 0238 03/18/15 0755 03/18/15 1439  TROPONINI 7.99* 4.73* 4.59*   Hemoglobin A1C  Recent Labs  03/18/15 0238  HGBA1C 5.6   Fasting Lipid Panel  Recent Labs  03/18/15 0238  CHOL 162  HDL 60  LDLCALC 94    TRIG 41  CHOLHDL 2.7   Thyroid Function Tests  Recent Labs  03/18/15 0238  TSH 0.401   Disposition  Pt is being discharged home today in good condition.  Follow-up Plans & Appointments      Follow-up Information    Follow up with [name], NP On 03/27/2015.   Specialties:  Nurse Practitioner, Cardiology, Radiology   Why:  10:00 - Dr. Hassell Done NP   Contact information:   [place] [place] [address] [place] [address] [phone]       Follow up with Beverlyn Roux, MD.   Specialty:  Family Medicine   Why:  as scheduled   Contact information:   [place] [address] [phone]       Follow up with Estanislado Emms, MD.   Specialty:  Nephrology   Why:  as scheduled.   Contact information:   [place] [address] [phone]       Follow up with [place] On 03/24/2015.   Why:  lab only - blood chemistry to reassess kidney function.   Contact information:   [place] [address] [phone]      Discharge Medications    Medication List    STOP taking these medications        tadalafil 5 MG tablet  Commonly known as:  CIALIS      TAKE these medications        aspirin 81 MG EC tablet  Take 1 tablet (81 mg total) by mouth daily.     atorvastatin 80 MG tablet  Commonly known as:  LIPITOR  Take 1 tablet (80 mg total) by mouth daily at 6 PM.     baclofen 10 MG tablet  Commonly known as:  LIORESAL  Take 1 tablet (10 mg total) by mouth 3 (three) times daily.     cetirizine 10 MG tablet  Commonly known as:  ZYRTEC  Take 1 tablet (10 mg total) by mouth daily.     fluticasone 50 MCG/ACT  nasal spray  Commonly known as:  FLONASE  Place 1 spray into both nostrils daily.     furosemide 40 MG tablet  Commonly known as:  LASIX  Take 1 tablet (40 mg total) by mouth daily.     metoprolol  tartrate 25 MG tablet  Commonly known as:  LOPRESSOR  Take 1 tablet (25 mg total) by mouth 2 (two) times daily.     montelukast 10 MG tablet  Commonly known as:  SINGULAIR  Take 1 tablet (10 mg total) by mouth at bedtime.     nitroGLYCERIN 0.4 MG SL tablet  Commonly known as:  NITROSTAT  Place 1 tablet (0.4 mg total) under the tongue every 5 (five) minutes x 3 doses as needed for chest pain.     oxyCODONE-acetaminophen 7.5-325 MG per tablet  Commonly known as:  PERCOCET  Take 1 tablet by mouth every 6 (six) hours as needed for severe pain.     potassium chloride 10 MEQ tablet  Commonly known as:  K-DUR  Take 1 tablet (10 mEq total) by mouth daily.     ticagrelor 90 MG Tabs tablet  Commonly known as:  BRILINTA  Take 1 tablet (90 mg total) by mouth 2 (two) times daily.       Outstanding Labs/Studies  Follow-up basic metabolic panel on Monday, May 16. Follow-up lipids and LFTs in 6 weeks.  Plan for staged percutaneous intervention of the left circumflex within the next few weeks.  Plan for outpatient cardiac MRI following staged intervention.   Duration of Discharge Encounter   Greater than 30 minutes including physician time.  Signed, [name] NP 03/20/2015, 9:07 AM    I have examined the patient and reviewed assessment and plan and discussed with patient. Agree with above as stated. Stressed importance of DAPT. Recommended cardiac rehab after PCI of left circ in a few weeks. Low dose lasix given HOCM. WIll need OP cardiac MRI after some time from the nephrotoxins.  Concern for amyloid was raised.  Damieon Armendariz S.

## 2015-03-20 NOTE — Discharge Instructions (Signed)
**  PLEASE REMEMBER TO BRING ALL OF YOUR MEDICATIONS TO EACH OF YOUR FOLLOW-UP OFFICE VISITS.  NO HEAVY LIFTING X 2 WEEKS. NO SEXUAL ACTIVITY X 2 WEEKS. NO DRIVING X 1 WEEK. NO SOAKING BATHS, HOT TUBS, POOLS, ETC., X 7 DAYS.  Radial Site Care Refer to this sheet in the next few weeks. These instructions provide you with information on caring for yourself after your procedure. Your caregiver may also give you more specific instructions. Your treatment has been planned according to current medical practices, but problems sometimes occur. Call your caregiver if you have any problems or questions after your procedure. HOME CARE INSTRUCTIONS  You may shower the day after the procedure.Remove the bandage (dressing) and gently wash the site with plain soap and water.Gently pat the site dry.   Do not apply powder or lotion to the site.   Do not submerge the affected site in water for 3 to 5 days.   Inspect the site at least twice daily.   Do not flex or bend the affected arm for 24 hours.   No lifting over 5 pounds (2.3 kg) for 5 days after your procedure.   Do not drive home if you are discharged the same day of the procedure. Have someone else drive you.   What to expect:  Any bruising will usually fade within 1 to 2 weeks.   Blood that collects in the tissue (hematoma) may be painful to the touch. It should usually decrease in size and tenderness within 1 to 2 weeks.  SEEK IMMEDIATE MEDICAL CARE IF:  You have unusual pain at the radial site.   You have redness, warmth, swelling, or pain at the radial site.   You have drainage (other than a small amount of blood on the dressing).   You have chills.   You have a fever or persistent symptoms for more than 72 hours.   You have a fever and your symptoms suddenly get worse.   Your arm becomes pale, cool, tingly, or numb.   You have heavy bleeding from the site. Hold pressure on the site.

## 2015-03-21 LAB — UIFE/LIGHT CHAINS/TP QN, 24-HR UR
% BETA, URINE: 20.5 %
ALBUMIN, U: 49.4 %
ALPHA 1 URINE: 3.7 %
ALPHA 2 UR: 17.1 %
FREE KAPPA/LAMBDA RATIO: 5.75 (ref 2.04–10.37)
FREE LAMBDA LT CHAINS, UR: 2.47 mg/L (ref 0.24–6.66)
FREE LT CHN EXCR RATE: 14.2 mg/L (ref 1.35–24.19)
GAMMA GLOBULIN URINE: 9.3 %
TOTAL PROTEIN, URINE-UPE24: 4 mg/dL

## 2015-03-21 NOTE — Telephone Encounter (Signed)
Patient contacted regarding discharge from [facility] on Mar 20, 2015.  Patient understands to follow up with provider Ignacia Bayley, NP on Mar 27, 2015 at [place] at [facility]. Patient understands discharge instructions? yes Patient understands medications and regiment? yes Patient understands to bring all medications to this visit? yes  Also informed pt of lab appt on 03/24/15. Pt verbalized understanding.

## 2015-03-24 ENCOUNTER — Other Ambulatory Visit (INDEPENDENT_AMBULATORY_CARE_PROVIDER_SITE_OTHER): Payer: Medicare HMO | Admitting: *Deleted

## 2015-03-24 DIAGNOSIS — N183 Chronic kidney disease, stage 3 unspecified: Secondary | ICD-10-CM

## 2015-03-24 LAB — BASIC METABOLIC PANEL
BUN: 28 mg/dL — AB (ref 6–23)
CHLORIDE: 101 meq/L (ref 96–112)
CO2: 33 mEq/L — ABNORMAL HIGH (ref 19–32)
CREATININE: 2.27 mg/dL — AB (ref 0.40–1.50)
Calcium: 9.7 mg/dL (ref 8.4–10.5)
GFR: 38.55 mL/min — ABNORMAL LOW (ref >60.00)
Glucose, Bld: 87 mg/dL (ref 70–99)
Potassium: 5.1 mEq/L (ref 3.5–5.1)
SODIUM: 138 meq/L (ref 135–145)

## 2015-03-27 ENCOUNTER — Encounter: Payer: Self-pay | Admitting: Nurse Practitioner

## 2015-03-27 ENCOUNTER — Ambulatory Visit (INDEPENDENT_AMBULATORY_CARE_PROVIDER_SITE_OTHER): Payer: Medicare HMO | Admitting: Nurse Practitioner

## 2015-03-27 VITALS — BP 100/50 | HR 56 | Ht 71.5 in | Wt 191.0 lb

## 2015-03-27 DIAGNOSIS — E785 Hyperlipidemia, unspecified: Secondary | ICD-10-CM

## 2015-03-27 DIAGNOSIS — I214 Non-ST elevation (NSTEMI) myocardial infarction: Secondary | ICD-10-CM | POA: Diagnosis not present

## 2015-03-27 DIAGNOSIS — N183 Chronic kidney disease, stage 3 unspecified: Secondary | ICD-10-CM

## 2015-03-27 DIAGNOSIS — I421 Obstructive hypertrophic cardiomyopathy: Secondary | ICD-10-CM

## 2015-03-27 DIAGNOSIS — I10 Essential (primary) hypertension: Secondary | ICD-10-CM

## 2015-03-27 DIAGNOSIS — I2510 Atherosclerotic heart disease of native coronary artery without angina pectoris: Secondary | ICD-10-CM | POA: Diagnosis not present

## 2015-03-27 DIAGNOSIS — I5032 Chronic diastolic (congestive) heart failure: Secondary | ICD-10-CM

## 2015-03-27 DIAGNOSIS — I5042 Chronic combined systolic (congestive) and diastolic (congestive) heart failure: Secondary | ICD-10-CM | POA: Insufficient documentation

## 2015-03-27 DIAGNOSIS — F17200 Nicotine dependence, unspecified, uncomplicated: Secondary | ICD-10-CM

## 2015-03-27 DIAGNOSIS — I222 Subsequent non-ST elevation (NSTEMI) myocardial infarction: Secondary | ICD-10-CM | POA: Diagnosis not present

## 2015-03-27 DIAGNOSIS — Z720 Tobacco use: Secondary | ICD-10-CM

## 2015-03-27 HISTORY — DX: Chronic combined systolic (congestive) and diastolic (congestive) heart failure: I50.42

## 2015-03-27 LAB — BASIC METABOLIC PANEL
BUN: 39 mg/dL — AB (ref 6–23)
CO2: 23 mEq/L (ref 19–32)
CREATININE: 2.13 mg/dL — AB (ref 0.40–1.50)
Calcium: 9.5 mg/dL (ref 8.4–10.5)
Chloride: 105 mEq/L (ref 96–112)
GFR: 41.49 mL/min — AB (ref >60.00)
GLUCOSE: 109 mg/dL — AB (ref 70–99)
Potassium: 4.5 mEq/L (ref 3.5–5.1)
SODIUM: 136 meq/L (ref 135–145)

## 2015-03-27 NOTE — Progress Notes (Signed)
Patient Name: Paul [name] Date of Encounter: 03/27/2015  Primary Care Provider:  Beverlyn Roux, MD Primary Cardiologist:  Lendell Caprice, MD   Chief Complaint  54 year old male with a history of hypertrophic cardiomyopathy and paroxysmal atrial fibrillation who was recently admitted with non-STEMI and presents for follow-up.  Past Medical History   Past Medical History  Diagnosis Date  . CKD (chronic kidney disease), stage III   . PAF (paroxysmal atrial fibrillation)     remote isolated episode - 09/2012.  Marland Kitchen Hypertension     denies  . GERD (gastroesophageal reflux disease)     occ  . Arthritis   . TMJ arthralgia   . HOCM (hypertrophic obstructive cardiomyopathy)     a. 03/2015 Echo: EF 65-60%, sev LV thickening w/ bright, speckled appearance of IV septum, Gr 2 DD, AoV sclerosis, triv AI, mild MR, LA 92ml/m^2, RA 25ml/m^2, aneurysmal IAS, mod TR, PASP 18mmHg.  Marland Kitchen CAD (coronary artery disease)     a. 03/2015 NSTEMI/PCI: LM nl, LAD 95p (3.5x20 Promus DES), D1 60, D2/3 mild dzs, LCX 88m (plan for staged pci), OM1/2 min irregs, RCA 20p, RPDA 60.   Past Surgical History  Procedure Laterality Date  . Hip surgery Left 72    lft   . Total hip arthroplasty Right 03,01    x2  . Appendectomy    . Laparoscopy  09/22/2012    Procedure: LAPAROSCOPY DIAGNOSTIC;  Surgeon: Harl Bowie, MD;  Location: [facility];  Service: General;  Laterality: N/A;  . Laparoscopic appendectomy  09/22/2012    Procedure: APPENDECTOMY LAPAROSCOPIC;  Surgeon: Harl Bowie, MD;  Location: [facility];  Service: General;;  . Total hip arthroplasty with hardware removal Left 08/05/2014    Procedure: TOTAL HIP ARTHROPLASTY WITH HARDWARE REMOVAL;  Surgeon: Kerin Salen, MD;  Location: [place];  Service: Orthopedics;  Laterality: Left;  . Cardiac catheterization N/A 03/19/2015    Procedure: Left Heart Cath and Coronary Angiography;  Surgeon: [name], MD;  Location: [place] [facility];  Service:  Cardiovascular;  Laterality: N/A;  . Cardiac catheterization  03/19/2015    Procedure: Coronary Stent Intervention;  Surgeon: [name], MD;  Location: [place] [facility];  Service: Cardiovascular;;    Allergies  Allergies  Allergen Reactions  . Ibuprofen Other (See Comments)    Kidney damage  . Nsaids     REACTION: nsaid induced nephropathy    HPI  54 year old male with the above complex problem list. He was recently admitted to [facility] secondary to complaints of chest pressure, tightness, and dyspnea. He ruled in for non-ST elevation MI. Chest x-ray showed edema and he was treated with IV Lasix. With diuresis, he has significant clinical improvement. He had no further chest pain. Echo showed normal LV function was severe LVH and severe biatrial enlargement. There was also a bright speckled appearance of the intraventricular septum concerning for an infiltrative process. Cardiac MRI was recommended.   He underwent diagnostic catheterization revealing severe LAD and LCX dzs.  The LAD was successfully treated with a plan for staged intervention of the LCX.  He was discharged the following day.  We did send him out on Lasix however follow-up creatinine on May 16 was elevated at 2.27. He was contacted and advised to discontinue Lasix and potassium. He is due to have a basic metabolic panel repeated today. Since his discharge, he has not had any chest pain, dyspnea, PND, orthopnea, dizziness, syncope, edema, or early satiety.  Home Medications  Prior to Admission medicati more ons   Medication Sig Start Date End Date Taking? Authorizing Provider  aspirin EC 81 MG EC tablet Take 1 tablet (81 mg total) by mouth daily. 03/20/15  Yes Rogelia Mire, NP  atorvastatin (LIPITOR) 80 MG tablet Take 1 tablet (80 mg total) by mouth daily at 6 PM. 03/20/15  Yes Rogelia Mire, NP  baclofen (LIORESAL) 10 MG tablet Take 1 tablet (10 mg total) by mouth 3 (three) times daily. 11/25/14  Yes  Frazier Richards, MD  cetirizine (ZYRTEC) 10 MG tablet Take 1 tablet (10 mg total) by mouth daily. 11/19/14  Yes Frazier Richards, MD  CIALIS 5 MG tablet Take 5 mg by mouth. 01/13/15  Yes Historical Provider, MD  furosemide (LASIX) 40 MG tablet Take 1 tablet (40 mg total) by mouth daily. 03/20/15  Yes Rogelia Mire, NP  meloxicam (MOBIC) 15 MG tablet Take 15 mg by mouth daily. 03/19/15  Yes Historical Provider, MD  metoprolol tartrate (LOPRESSOR) 25 MG tablet Take 1 tablet (25 mg total) by mouth 2 (two) times daily. 03/20/15  Yes Rogelia Mire, NP  montelukast (SINGULAIR) 10 MG tablet Take 1 tablet (10 mg total) by mouth at bedtime. 11/20/14  Yes Frazier Richards, MD  nitroGLYCERIN (NITROSTAT) 0.4 MG SL tablet Place 1 tablet (0.4 mg total) under the tongue every 5 (five) minutes x 3 doses as needed for chest pain. 03/20/15  Yes Rogelia Mire, NP  oxyCODONE-acetaminophen (PERCOCET) 7.5-325 MG per tablet Take 1 tablet by mouth every 6 (six) hours as needed for severe pain. 03/04/15  Yes Frazier Richards, MD  predniSONE (DELTASONE) 50 MG tablet Take 50 mg by mouth daily. 03/04/15  Yes Historical Provider, MD  ticagrelor (BRILINTA) 90 MG TABS tablet Take 1 tablet (90 mg total) by mouth 2 (two) times daily. 03/20/15  Yes Rogelia Mire, NP    Review of Systems  As above, he has been doing well since discharge.  He denies chest pain, palpitations, dyspnea, pnd, orthopnea, n, v, dizziness, syncope, edema, weight gain, or early satiety. All other systems reviewed and are otherwise negative except as noted above.  Physical Exam  VS:  BP 100/50 mmHg  Pulse 56  Ht 5' 11.5" (1.816 m)  Wt 191 lb (86.637 kg)  BMI 26.27 kg/m2 , BMI Body mass index is 26.27 kg/(m^2). GEN: Well nourished, well developed, in no acute distress. HEENT: normal. Neck: Supple, no JVD, carotid bruits, or masses. Cardiac: RRR, no murmurs, rubs, or gallops. No clubbing, cyanosis, edema.  Radials/DP/PT 2+ and equal bilaterally.    Respiratory:  Respirations regular and unlabored, clear to auscultation bilaterally. GI: Soft, nontender, nondistended, BS + x 4. MS: no deformity or atrophy. Skin: warm and dry, no rash. Neuro:  Strength and sensation are intact. Psych: Normal affect.  Accessory Clinical Findings  ECG - sinus bradycardia, 56, inferolateral T-wave inversion   Assessment & Plan  1.  Non-ST elevation myocardial infarction, subsequent episode of care/coronary artery disease: Patient status post recent hospitalization with non-STEMI and diastolic heart failure. Catheterization revealed severe LAD and circumflex disease and he is status post LAD drug-eluting stent placement. He has residual left circumflex disease with a plan for staged intervention. I will repeat a basic metabolic panel today. Provided that creatinine is within his previous range, which she quotes at 1.4-1.8, we can then arrange for admission next week for hydration followed by PCI of the left circumflex. Patient is agreeable  with this plan. He remains on aspirin, statin, beta blocker, and Brilinta therapy.    2. Essential hypertension: Patient was hypertensive during hospitalization and this is much better controlled currently. He reports compliance with his medications. Continue beta blocker.  3. Hypertrophic cardiopathy/chronic diastolic congestive heart failure: Volume is stable. Weight is down nearly 3 pounds since discharge. We did have to discontinue his Lasix secondary to acute on chronic renal failure. Heart rate and blood pressure well controlled. Of note, echocardiogram during hospitalization suggested a bright and speckled appearance of the intra-ventricular septum with recommendation for cardiac MRI. This was not performed during hospitalization given CK D and recent contrast. This can be arranged to be performed as an outpatient following his circumflex intervention, provided that renal function remained stable.  4. Hyperlipidemia: LDL  was 94 during recent hospitalization. He will need follow-up lipids and LFTs in approximately 6 weeks.  5. Tobacco abuse: He is cutting back and says he will quit.  6. Stage III chronic kidney disease: Follow-up basic metabolic panel today. Lasix was discontinued earlier this week secondary to worsening renal function and mild hyperkalemia.  7. Disposition: Follow-up renal function today. If stable, plan to admit for hydration X Tuesday with PCI on Wednesday with Dr. Fletcher Anon, who performed his prior intervention.  [name], NP 03/27/2015, 12:16 PM

## 2015-03-27 NOTE — Patient Instructions (Signed)
Medication Instructions:  None  Labwork: BMET  Testing/Procedures: None  Follow-Up: None  Any Other Special Instructions Will Be Listed Below (If Applicable).  Will be in touch after we get your results back from lab

## 2015-03-28 ENCOUNTER — Other Ambulatory Visit: Payer: Self-pay | Admitting: *Deleted

## 2015-03-28 DIAGNOSIS — I4891 Unspecified atrial fibrillation: Secondary | ICD-10-CM

## 2015-03-28 DIAGNOSIS — N19 Unspecified kidney failure: Secondary | ICD-10-CM

## 2015-03-31 ENCOUNTER — Other Ambulatory Visit (INDEPENDENT_AMBULATORY_CARE_PROVIDER_SITE_OTHER): Payer: Medicare HMO | Admitting: *Deleted

## 2015-03-31 DIAGNOSIS — I4891 Unspecified atrial fibrillation: Secondary | ICD-10-CM | POA: Diagnosis not present

## 2015-03-31 DIAGNOSIS — N19 Unspecified kidney failure: Secondary | ICD-10-CM

## 2015-03-31 LAB — BASIC METABOLIC PANEL
BUN: 32 mg/dL — AB (ref 6–23)
CO2: 25 meq/L (ref 19–32)
Calcium: 8.7 mg/dL (ref 8.4–10.5)
Chloride: 108 mEq/L (ref 96–112)
Creatinine, Ser: 2.15 mg/dL — ABNORMAL HIGH (ref 0.40–1.50)
GFR: 41.04 mL/min — AB (ref >60.00)
Glucose, Bld: 93 mg/dL (ref 70–99)
Potassium: 4.7 mEq/L (ref 3.5–5.1)
Sodium: 138 mEq/L (ref 135–145)

## 2015-04-02 ENCOUNTER — Telehealth: Payer: Self-pay | Admitting: Interventional Cardiology

## 2015-04-02 DIAGNOSIS — R7989 Other specified abnormal findings of blood chemistry: Secondary | ICD-10-CM

## 2015-04-02 NOTE — Telephone Encounter (Signed)
Creat remains elevated but relatively stable.  I'm waiting to hear back from Drs. [place] as to timing of next procedure.  With creat remaining up, would prefer to see that this is stable prior to doing elective PCI, especially since he's asymptomatic.  I've asked Juanda Crumble to arrange for f/u bmet next Tuesday and if stable, and MD's are in agreement, we could have him come in late Tuesday for hydration and PCI Wed with Dr. Fletcher Anon.  Results for Paul, [name] (MRN [id number]) as of 04/02/2015 08:49  Ref. Range 03/31/2015 09:28  Sodium Latest Ref Range: 135-145 mEq/L 138  Potassium Latest Ref Range: 3.5-5.1 mEq/L 4.7  Chloride Latest Ref Range: 96-112 mEq/L 108  CO2 Latest Ref Range: 19-32 mEq/L 25  BUN Latest Ref Range: 6-23 mg/dL 32 (H)  Creatinine Latest Ref Range: 0.40-1.50 mg/dL 2.15 (H)  Calcium Latest Ref Range: 8.4-10.5 mg/dL 8.7  Glucose Latest Ref Range: 70-99 mg/dL 93  GFR Latest Ref Range: >60.00 mL/min 41.04 (L)

## 2015-04-02 NOTE — Telephone Encounter (Signed)
NEw Message  Pt calling to speak w/ Rn about lab results. Please call back and discuss.

## 2015-04-02 NOTE — Telephone Encounter (Signed)
I agree with planning medical therapy only for now unless he has sx of CP, especially given his renal function.

## 2015-04-02 NOTE — Telephone Encounter (Signed)
Pt calling stating that he came in Monday for repeat lab work and hasn't heard anything. Informed pt that labs have not been officially resulted yet. Pt states that he knows he has a blockage and is eager to know what the next step is going to be. Informed pt that I would send this information to Dr. Irish Lack and Ignacia Bayley, NP for review and advisement. Pt verbalized understanding and was in agreement with this plan.

## 2015-04-02 NOTE — Telephone Encounter (Signed)
Left message to call back  

## 2015-04-03 NOTE — Telephone Encounter (Signed)
Spoke with pt and made him aware of information provided by Ignacia Bayley, NP in regards to labs and possibly doing hydration Tuesday night and PCI on Wednesday. Made lab appt for 5/31. Pt verbalized understanding and was in agreement with this plan.

## 2015-04-03 NOTE — Telephone Encounter (Signed)
Left message to call back  

## 2015-04-08 ENCOUNTER — Other Ambulatory Visit: Payer: Self-pay | Admitting: Nurse Practitioner

## 2015-04-08 ENCOUNTER — Observation Stay (HOSPITAL_COMMUNITY)
Admission: AD | Admit: 2015-04-08 | Discharge: 2015-04-10 | Disposition: A | Discharge status: Home / Self Care | Payer: Medicare HMO | Source: Ambulatory Visit | Attending: Cardiovascular Disease | Admitting: Cardiovascular Disease

## 2015-04-08 ENCOUNTER — Other Ambulatory Visit (INDEPENDENT_AMBULATORY_CARE_PROVIDER_SITE_OTHER): Payer: Medicare HMO | Admitting: *Deleted

## 2015-04-08 DIAGNOSIS — N179 Acute kidney failure, unspecified: Secondary | ICD-10-CM | POA: Insufficient documentation

## 2015-04-08 DIAGNOSIS — I110 Hypertensive heart disease with heart failure: Secondary | ICD-10-CM | POA: Diagnosis not present

## 2015-04-08 DIAGNOSIS — Z950 Presence of cardiac pacemaker: Secondary | ICD-10-CM | POA: Insufficient documentation

## 2015-04-08 DIAGNOSIS — M1612 Unilateral primary osteoarthritis, left hip: Secondary | ICD-10-CM | POA: Diagnosis not present

## 2015-04-08 DIAGNOSIS — R748 Abnormal levels of other serum enzymes: Secondary | ICD-10-CM | POA: Diagnosis not present

## 2015-04-08 DIAGNOSIS — Z7951 Long term (current) use of inhaled steroids: Secondary | ICD-10-CM | POA: Diagnosis not present

## 2015-04-08 DIAGNOSIS — I129 Hypertensive chronic kidney disease with stage 1 through stage 4 chronic kidney disease, or unspecified chronic kidney disease: Secondary | ICD-10-CM | POA: Insufficient documentation

## 2015-04-08 DIAGNOSIS — I5032 Chronic diastolic (congestive) heart failure: Secondary | ICD-10-CM | POA: Insufficient documentation

## 2015-04-08 DIAGNOSIS — Z9861 Coronary angioplasty status: Secondary | ICD-10-CM

## 2015-04-08 DIAGNOSIS — I252 Old myocardial infarction: Secondary | ICD-10-CM | POA: Insufficient documentation

## 2015-04-08 DIAGNOSIS — N183 Chronic kidney disease, stage 3 unspecified: Secondary | ICD-10-CM | POA: Diagnosis present

## 2015-04-08 DIAGNOSIS — I480 Paroxysmal atrial fibrillation: Secondary | ICD-10-CM | POA: Insufficient documentation

## 2015-04-08 DIAGNOSIS — Z7952 Long term (current) use of systemic steroids: Secondary | ICD-10-CM | POA: Diagnosis not present

## 2015-04-08 DIAGNOSIS — N529 Male erectile dysfunction, unspecified: Secondary | ICD-10-CM | POA: Diagnosis not present

## 2015-04-08 DIAGNOSIS — I422 Other hypertrophic cardiomyopathy: Secondary | ICD-10-CM | POA: Diagnosis not present

## 2015-04-08 DIAGNOSIS — I472 Ventricular tachycardia: Secondary | ICD-10-CM | POA: Insufficient documentation

## 2015-04-08 DIAGNOSIS — E785 Hyperlipidemia, unspecified: Secondary | ICD-10-CM | POA: Diagnosis not present

## 2015-04-08 DIAGNOSIS — I119 Hypertensive heart disease without heart failure: Secondary | ICD-10-CM

## 2015-04-08 DIAGNOSIS — Z955 Presence of coronary angioplasty implant and graft: Secondary | ICD-10-CM | POA: Diagnosis not present

## 2015-04-08 DIAGNOSIS — I2510 Atherosclerotic heart disease of native coronary artery without angina pectoris: Secondary | ICD-10-CM

## 2015-04-08 DIAGNOSIS — K219 Gastro-esophageal reflux disease without esophagitis: Secondary | ICD-10-CM | POA: Insufficient documentation

## 2015-04-08 DIAGNOSIS — I25118 Atherosclerotic heart disease of native coronary artery with other forms of angina pectoris: Secondary | ICD-10-CM

## 2015-04-08 DIAGNOSIS — I498 Other specified cardiac arrhythmias: Secondary | ICD-10-CM | POA: Diagnosis present

## 2015-04-08 DIAGNOSIS — R7989 Other specified abnormal findings of blood chemistry: Secondary | ICD-10-CM

## 2015-04-08 DIAGNOSIS — F528 Other sexual dysfunction not due to a substance or known physiological condition: Secondary | ICD-10-CM | POA: Diagnosis present

## 2015-04-08 DIAGNOSIS — Z7982 Long term (current) use of aspirin: Secondary | ICD-10-CM | POA: Diagnosis not present

## 2015-04-08 DIAGNOSIS — M25551 Pain in right hip: Secondary | ICD-10-CM

## 2015-04-08 DIAGNOSIS — F17210 Nicotine dependence, cigarettes, uncomplicated: Secondary | ICD-10-CM | POA: Diagnosis not present

## 2015-04-08 DIAGNOSIS — I43 Cardiomyopathy in diseases classified elsewhere: Secondary | ICD-10-CM

## 2015-04-08 DIAGNOSIS — M25552 Pain in left hip: Secondary | ICD-10-CM

## 2015-04-08 DIAGNOSIS — S39012A Strain of muscle, fascia and tendon of lower back, initial encounter: Secondary | ICD-10-CM

## 2015-04-08 DIAGNOSIS — I10 Essential (primary) hypertension: Secondary | ICD-10-CM | POA: Diagnosis present

## 2015-04-08 LAB — CBC WITH DIFFERENTIAL/PLATELET
Basophils Absolute: 0 10*3/uL (ref 0.0–0.1)
Basophils Relative: 0 % (ref 0–1)
EOS ABS: 0.1 10*3/uL (ref 0.0–0.7)
Eosinophils Relative: 1 % (ref 0–5)
HEMATOCRIT: 33 % — AB (ref 39.0–52.0)
HEMOGLOBIN: 10.4 g/dL — AB (ref 13.0–17.0)
Lymphocytes Relative: 27 % (ref 12–46)
Lymphs Abs: 2.6 10*3/uL (ref 0.7–4.0)
MCH: 24.9 pg — ABNORMAL LOW (ref 26.0–34.0)
MCHC: 31.5 g/dL (ref 30.0–36.0)
MCV: 78.9 fL (ref 78.0–100.0)
MONOS PCT: 7 % (ref 3–12)
Monocytes Absolute: 0.6 10*3/uL (ref 0.1–1.0)
NEUTROS ABS: 6.2 10*3/uL (ref 1.7–7.7)
Neutrophils Relative %: 65 % (ref 43–77)
Platelets: 214 10*3/uL (ref 150–400)
RBC: 4.18 MIL/uL — ABNORMAL LOW (ref 4.22–5.81)
RDW: 14.3 % (ref 11.5–15.5)
WBC: 9.6 10*3/uL (ref 4.0–10.5)

## 2015-04-08 LAB — BASIC METABOLIC PANEL
BUN: 18 mg/dL (ref 6–23)
CHLORIDE: 111 meq/L (ref 96–112)
CO2: 23 mEq/L (ref 19–32)
CREATININE: 1.79 mg/dL — AB (ref 0.40–1.50)
Calcium: 9.1 mg/dL (ref 8.4–10.5)
GFR: 50.7 mL/min — ABNORMAL LOW (ref >60.00)
Glucose, Bld: 97 mg/dL (ref 70–99)
Potassium: 4 mEq/L (ref 3.5–5.1)
SODIUM: 141 meq/L (ref 135–145)

## 2015-04-08 LAB — PROTIME-INR
INR: 1.2 (ref 0.00–1.49)
Prothrombin Time: 15.4 seconds — ABNORMAL HIGH (ref 11.6–15.2)

## 2015-04-08 MED ORDER — SODIUM CHLORIDE 0.9 % IV SOLN: 250.0000 mL | INTRAVENOUS | Status: DC | PRN

## 2015-04-08 MED ORDER — SODIUM CHLORIDE 0.9 % IJ SOLN
3.0000 mL | Freq: Two times a day (BID) | INTRAMUSCULAR | Status: DC
  Administered 2015-04-08: 3 mL via INTRAVENOUS

## 2015-04-08 MED ORDER — METOPROLOL TARTRATE 12.5 MG HALF TABLET: 25.0000 mg | ORAL_TABLET | Freq: Two times a day (BID) | ORAL | Status: DC

## 2015-04-08 MED ORDER — ONDANSETRON HCL 4 MG/2ML IJ SOLN: 4.0000 mg | Freq: Four times a day (QID) | INTRAMUSCULAR | Status: DC | PRN

## 2015-04-08 MED ORDER — BACLOFEN 10 MG PO TABS
10.0000 mg | ORAL_TABLET | Freq: Three times a day (TID) | ORAL | Status: DC
  Administered 2015-04-08 – 2015-04-10 (×5): 10 mg via ORAL
  Filled 2015-04-08 (×7): qty 1

## 2015-04-08 MED ORDER — SODIUM CHLORIDE 0.9 % WEIGHT BASED INFUSION
1.0000 mL/kg/h | INTRAVENOUS | Status: DC
  Administered 2015-04-08 – 2015-04-09 (×2): 1 mL/kg/h via INTRAVENOUS

## 2015-04-08 MED ORDER — ASPIRIN EC 81 MG PO TBEC: 81.0000 mg | DELAYED_RELEASE_TABLET | Freq: Every day | ORAL | Status: DC

## 2015-04-08 MED ORDER — TICAGRELOR 90 MG PO TABS
90.0000 mg | ORAL_TABLET | Freq: Two times a day (BID) | ORAL | Status: DC
  Administered 2015-04-08 – 2015-04-10 (×4): 90 mg via ORAL
  Filled 2015-04-08 (×5): qty 1

## 2015-04-08 MED ORDER — ASPIRIN EC 81 MG PO TBEC
81.0000 mg | DELAYED_RELEASE_TABLET | Freq: Every day | ORAL | Status: DC
  Administered 2015-04-10: 10:00:00 81 mg via ORAL
  Filled 2015-04-08 (×3): qty 1

## 2015-04-08 MED ORDER — MONTELUKAST SODIUM 10 MG PO TABS: 10.0000 mg | ORAL_TABLET | Freq: Every day | ORAL | Status: DC

## 2015-04-08 MED ORDER — TICAGRELOR 90 MG PO TABS: 90.0000 mg | ORAL_TABLET | Freq: Two times a day (BID) | ORAL | Status: DC

## 2015-04-08 MED ORDER — HEPARIN SODIUM (PORCINE) 5000 UNIT/ML IJ SOLN
5000.0000 [IU] | Freq: Three times a day (TID) | INTRAMUSCULAR | Status: DC
  Administered 2015-04-08 – 2015-04-10 (×3): 5000 [IU] via SUBCUTANEOUS
  Filled 2015-04-08 (×6): qty 1

## 2015-04-08 MED ORDER — OXYCODONE HCL 5 MG PO TABS
2.5000 mg | ORAL_TABLET | Freq: Four times a day (QID) | ORAL | Status: DC | PRN
  Administered 2015-04-08 – 2015-04-10 (×5): 2.5 mg via ORAL
  Filled 2015-04-08 (×5): qty 1

## 2015-04-08 MED ORDER — NITROGLYCERIN 0.4 MG SL SUBL: 0.4000 mg | SUBLINGUAL_TABLET | SUBLINGUAL | Status: DC | PRN

## 2015-04-08 MED ORDER — ACETAMINOPHEN 325 MG PO TABS: 650.0000 mg | ORAL_TABLET | ORAL | Status: DC | PRN

## 2015-04-08 MED ORDER — SODIUM CHLORIDE 0.9 % IV SOLN: 4.0000 mg | Freq: Four times a day (QID) | INTRAVENOUS | Status: DC | PRN

## 2015-04-08 MED ORDER — LORATADINE 10 MG PO TABS
10.0000 mg | ORAL_TABLET | Freq: Every day | ORAL | Status: DC
  Administered 2015-04-09 – 2015-04-10 (×2): 10 mg via ORAL
  Filled 2015-04-08 (×2): qty 1

## 2015-04-08 MED ORDER — LORATADINE 10 MG PO TABS: 10.0000 mg | ORAL_TABLET | Freq: Every day | ORAL | Status: DC

## 2015-04-08 MED ORDER — ASPIRIN 81 MG PO CHEW
81.0000 mg | CHEWABLE_TABLET | ORAL | Status: AC
  Administered 2015-04-09: 81 mg via ORAL
  Filled 2015-04-08: qty 1

## 2015-04-08 MED ORDER — ATORVASTATIN CALCIUM 10 MG PO TABS: 80.0000 mg | ORAL_TABLET | Freq: Every day | ORAL | Status: DC

## 2015-04-08 MED ORDER — OXYCODONE-ACETAMINOPHEN 5-325 MG PO TABS
1.0000 | ORAL_TABLET | Freq: Four times a day (QID) | ORAL | Status: DC | PRN
  Administered 2015-04-08 – 2015-04-10 (×5): 1 via ORAL
  Filled 2015-04-08 (×5): qty 1

## 2015-04-08 MED ORDER — HEPARIN SODIUM (PORCINE) 5000 UNIT/ML IJ SOLN: 5000.0000 [IU] | Freq: Three times a day (TID) | INTRAMUSCULAR | Status: DC

## 2015-04-08 MED ORDER — METOPROLOL TARTRATE 25 MG PO TABS
25.0000 mg | ORAL_TABLET | Freq: Two times a day (BID) | ORAL | Status: DC
  Administered 2015-04-08 – 2015-04-10 (×4): 25 mg via ORAL
  Filled 2015-04-08 (×5): qty 1

## 2015-04-08 MED ORDER — ATORVASTATIN CALCIUM 80 MG PO TABS
80.0000 mg | ORAL_TABLET | Freq: Every day | ORAL | Status: DC
  Administered 2015-04-09: 80 mg via ORAL
  Filled 2015-04-08 (×3): qty 1

## 2015-04-08 MED ORDER — SODIUM CHLORIDE 0.9 % IJ SOLN: 3.0000 mL | INTRAMUSCULAR | Status: DC | PRN

## 2015-04-08 MED ORDER — MONTELUKAST SODIUM 10 MG PO TABS
10.0000 mg | ORAL_TABLET | Freq: Every day | ORAL | Status: DC
  Administered 2015-04-08: 10 mg via ORAL
  Filled 2015-04-08 (×3): qty 1

## 2015-04-08 MED ORDER — OXYCODONE-ACETAMINOPHEN 7.5-325 MG PO TABS: 1.0000 | ORAL_TABLET | Freq: Four times a day (QID) | ORAL | Status: DC | PRN

## 2015-04-08 NOTE — H&P (Signed)
Patient ID: Paul [name] MRN: [id number], DOB/AGE: [birth year]   Admit date: 04/08/2015   Primary Physician: Beverlyn Roux, MD Primary Cardiologist: Lendell Caprice, MD   Pt. Profile:  54 year old male with a history of hypertrophic cardiomyopathy and paroxysmal atrial fibrillation who was recently admitted with non-STEMI and presents for readmission for hydration and PCI of the LCX.  Problem List  Past Medical History  Diagnosis Date  . CKD (chronic kidney disease), stage III   . PAF (paroxysmal atrial fibrillation)     remote isolated episode - 09/2012.  Marland Kitchen Hypertension     denies  . GERD (gastroesophageal reflux disease)     occ  . Arthritis   . TMJ arthralgia   . HOCM (hypertrophic obstructive cardiomyopathy)     a. 03/2015 Echo: EF 65-60%, sev LV thickening w/ bright, speckled appearance of IV septum, Gr 2 DD, AoV sclerosis, triv AI, mild MR, LA 68ml/m^2, RA 72ml/m^2, aneurysmal IAS, mod TR, PASP 2mmHg.  Marland Kitchen CAD (coronary artery disease)     a. 03/2015 NSTEMI/PCI: LM nl, LAD 95p (3.5x20 Promus DES), D1 60, D2/3 mild dzs, LCX 28m (plan for staged pci), OM1/2 min irregs, RCA 20p, RPDA 60.    Past Surgical History  Procedure Laterality Date  . Hip surgery Left 72    lft   . Total hip arthroplasty Right 03,01    x2  . Appendectomy    . Laparoscopy  09/22/2012    Procedure: LAPAROSCOPY DIAGNOSTIC;  Surgeon: Harl Bowie, MD;  Location: [facility];  Service: General;  Laterality: N/A;  . Laparoscopic appendectomy  09/22/2012    Procedure: APPENDECTOMY LAPAROSCOPIC;  Surgeon: Harl Bowie, MD;  Location: [facility];  Service: General;;  . Total hip arthroplasty with hardware removal Left 08/05/2014    Procedure: TOTAL HIP ARTHROPLASTY WITH HARDWARE REMOVAL;  Surgeon: Kerin Salen, MD;  Location: [place];  Service: Orthopedics;  Laterality: Left;  . Cardiac catheterization N/A 03/19/2015    Procedure: Left Heart Cath and Coronary Angiography;  Surgeon: [name], MD;   Location: [place] [facility];  Service: Cardiovascular;  Laterality: N/A;  . Cardiac catheterization  03/19/2015    Procedure: Coronary Stent Intervention;  Surgeon: [name], MD;  Location: [place] [facility];  Service: Cardiovascular;;     Allergies  Allergies  Allergen Reactions  . Ibuprofen Other (See Comments)    Kidney damage  . Nsaids     REACTION: nsaid induced nephropathy    HPI  54 year old male with the above complex problem list. He was recently admitted to [facility] secondary to complaints of chest pressure, tightness, and dyspnea. He ruled in for non-ST elevation MI. Chest x-ray showed edema and he was treated with IV Lasix. With diuresis, he has significant clinical improvement. He had no further chest pain. Echo showed normal LV function was severe LVH and severe biatrial enlargement. There was also a bright speckled appearance of the intraventricular septum concerning for an infiltrative process. Cardiac MRI was recommended.   He underwent diagnostic catheterization revealing severe LAD and LCX dzs. The LAD was successfully treated with a plan for staged intervention of the LCX. He was discharged the following day. We did send him out on Lasix however follow-up creatinine on May 16 was elevated at 2.27. He was contacted and advised to discontinue Lasix and potassium. He has been doing well @ home w/o chest pain or dyspnea.  Weight/volume has been stable.  Creat has been slow to  come down to his prior baseline and thus  Staged PCI has been delayed.  BMET today showed a creat of 1.79, down from 2.15 on 5/23.  We arranged for elective admission and hydration tonight with plan for PCI of the LCX with Dr. Fletcher Anon tomorrow.  He denies chest pain, palpitations, dyspnea, pnd, orthopnea, n, v, dizziness, syncope, edema, weight gain, or early satiety.   Home Medications  Prior to Admission medications   Medication Sig Start Date End Date Taking? Authorizing Provider  aspirin  EC 81 MG EC tablet Take 1 tablet (81 mg total) by mouth daily. 03/20/15   Rogelia Mire, NP  atorvastatin (LIPITOR) 80 MG tablet Take 1 tablet (80 mg total) by mouth daily at 6 PM. 03/20/15   Rogelia Mire, NP  baclofen (LIORESAL) 10 MG tablet Take 1 tablet (10 mg total) by mouth 3 (three) times daily. 11/25/14   Frazier Richards, MD  cetirizine (ZYRTEC) 10 MG tablet Take 1 tablet (10 mg total) by mouth daily. 11/19/14   Frazier Richards, MD  CIALIS 5 MG tablet Take 5 mg by mouth. 01/13/15   Historical Provider, MD  furosemide (LASIX) 40 MG tablet Take 1 tablet (40 mg total) by mouth daily. 03/20/15   Rogelia Mire, NP  meloxicam (MOBIC) 15 MG tablet Take 15 mg by mouth daily. 03/19/15   Historical Provider, MD  metoprolol tartrate (LOPRESSOR) 25 MG tablet Take 1 tablet (25 mg total) by mouth 2 (two) times daily. 03/20/15   Rogelia Mire, NP  montelukast (SINGULAIR) 10 MG tablet Take 1 tablet (10 mg total) by mouth at bedtime. 11/20/14   Frazier Richards, MD  nitroGLYCERIN (NITROSTAT) 0.4 MG SL tablet Place 1 tablet (0.4 mg total) under the tongue every 5 (five) minutes x 3 doses as needed for chest pain. 03/20/15   Rogelia Mire, NP  oxyCODONE-acetaminophen (PERCOCET) 7.5-325 MG per tablet Take 1 tablet by mouth every 6 (six) hours as needed for severe pain. 03/04/15   Frazier Richards, MD  predniSONE (DELTASONE) 50 MG tablet Take 50 mg by mouth daily. 03/04/15   Historical Provider, MD  ticagrelor (BRILINTA) 90 MG TABS tablet Take 1 tablet (90 mg total) by mouth 2 (two) times daily. 03/20/15   Rogelia Mire, NP    Family History  Family History  Problem Relation Age of Onset  . Heart disease Mother   . Heart disease Father     Social History  History   Social History  . Marital Status: Divorced    Spouse Name: N/A  . Number of Children: N/A  . Years of Education: N/A   Occupational History  . Not on file.   Social History Main Topics  . Smoking status: Light Tobacco  Smoker -- 0.25 packs/day for 10 years    Types: Cigarettes  . Smokeless tobacco: Never Used  . Alcohol Use: 1.2 oz/week    2 Cans of beer per week     Comment: occasional  . Drug Use: No  . Sexual Activity: Not on file   Other Topics Concern  . Not on file   Social History Narrative     Review of Systems General:  No chills, fever, night sweats or weight changes.  Cardiovascular:  No chest pain, dyspnea on exertion, edema, orthopnea, palpitations, paroxysmal nocturnal dyspnea. Dermatological: No rash, lesions/masses Respiratory: No cough, dyspnea Urologic: No hematuria, dysuria Abdominal:   No nausea, vomiting, diarrhea, bright red blood per rectum, melena, or  hematemesis Neurologic:  No visual changes, wkns, changes in mental status. All other systems reviewed and are otherwise negative except as noted above.  Physical Exam  There were no vitals taken for this visit.  General: Pleasant, NAD Psych: Normal affect. Neuro: Alert and oriented X 3. Moves all extremities spontaneously. HEENT: Normal  Neck: Supple without bruits or JVD. Lungs:  Resp regular and unlabored, CTA. Heart: RRR no s3, s4, or murmurs. Abdomen: Soft, non-tender, non-distended, BS + x 4.  Extremities: No clubbing, cyanosis or edema. DP/PT/Radials 2+ and equal bilaterally.  Labs  Lab Results  Component Value Date   WBC 10.6* 03/20/2015   HGB 11.3* 03/20/2015   HCT 35.0* 03/20/2015   MCV 77.8* 03/20/2015   PLT 167 03/20/2015    Recent Labs Lab 04/08/15 0827  NA 141  K 4.0  CL 111  CO2 23  BUN 18  CREATININE 1.79*  CALCIUM 9.1  GLUCOSE 97   Lab Results  Component Value Date   CHOL 162 03/18/2015   HDL 60 03/18/2015   LDLCALC 94 03/18/2015   TRIG 41 03/18/2015   No results found for: DDIMER   Radiology/Studies  Dg Chest 1 View  03/18/2015   CLINICAL DATA:  CHF  EXAM: CHEST  1 VIEW  COMPARISON:  the previous day's study  FINDINGS: worsening diffuse airspace opacities bilaterally,  right greater than left. mild cardiomegaly stable. blunting of lateral costophrenic angles suggesting small effusions. Visualized skeletal structures are unremarkable.  IMPRESSION: 1. worsening asymmetric infiltrates or edema.   Electronically Signed   By: Lucrezia Europe M.D.   On: 03/18/2015 09:07   Dg Chest 2 View  03/17/2015   CLINICAL DATA:  Wheezing and shortness of breath for the past 2 days. Smoker.  EXAM: CHEST  2 VIEW  COMPARISON:  07/29/2014.  FINDINGS: Stable enlarged cardiac silhouette and tortuous aorta. Interval patchy airspace opacity in both lungs. No pleural fluid seen. Lower thoracic spine degenerative changes.  IMPRESSION: 1. Interval patchy pneumonia in both lungs. Alveolar edema is less likely. 2. Stable cardiomegaly.   Electronically Signed   By: Claudie Revering M.D.   On: 03/17/2015 18:11   ECG  pending  ASSESSMENT AND PLAN  1. Non-ST elevation myocardial infarction, subsequent episode of care/coronary artery disease: Patient status post recent hospitalization with non-STEMI and diastolic heart failure. Catheterization revealed severe LAD and circumflex disease and he is status post LAD drug-eluting stent placement. He has residual left circumflex disease with a plan for staged intervention. Creat now back to baseline.  He'll be admitted today for hydration tonight and PCI tomorrow.  The patient understands that risks include but are not limited to stroke (1 in 1000), death (1 in 42), kidney failure [usually temporary] (1 in 500), bleeding (1 in 200), allergic reaction [possibly serious] (1 in 200), and agrees to proceed.  He remains on aspirin, statin, beta blocker, and Brilinta therapy.   2. Essential hypertension: This has been stable in outpt setting.  Continue beta blocker.  3. Hypertrophic cardiomyopathy/chronic diastolic congestive heart failure: Volume is stable. We did have to discontinue his Lasix secondary to acute on chronic renal failure. Of note, echocardiogram during  hospitalization suggested a bright and speckled appearance of the intra-ventricular septum with recommendation for cardiac MRI. This was not performed during hospitalization given CKD and recent contrast. This can be arranged to be performed as an outpatient following his circumflex intervention, provided that renal function remains stable.  4. Hyperlipidemia: LDL was 94 during recent hospitalization.  He will need follow-up lipids and LFTs in approximately 4 weeks.  5. Tobacco abuse: He is cutting back and says he will quit.  Complete cessation advised.  6. Stage III chronic kidney disease: Creat baseline this morning.  Suspect rise following hospitalization may have been multifactorial in setting of diuretic therapy and CIN.  Hydrate tonight and F/U bmet in AM.  Signed, [name], NP 04/08/2015, 5:17 PM  Patient seen, examined. Available data reviewed. Agree with findings, assessment, and plan as outlined by Ignacia Bayley, NP. The patient is independently interviewed and examined. He is known to me from a previous hospital admission. He presents today for precath hydration in the setting of chronic kidney disease. He is scheduled to undergo PCI tomorrow. He has been doing well at home. Exam reveals an alert, oriented male in no distress. Lungs are clear. Heart is regular rate and rhythm. There is no peripheral edema. His radial pulses 2+. He will start 1 cc/kg 0.9% saline for fluid hydration this evening to be continued until after his procedure tomorrow. I have reviewed the risks, indications, and alternatives to PTCA and stenting with the patient. He understands and agrees to proceed.  Sherren Mocha, M.D. 04/08/2015 6:21 PM

## 2015-04-08 NOTE — Progress Notes (Signed)
Pt arrived to unit. VSS. Pt oriented to unit. Will continue to monitor. Call bell within reach.    Raliegh Ip RN

## 2015-04-09 ENCOUNTER — Encounter (HOSPITAL_COMMUNITY)
Admission: AD | Disposition: A | Discharge status: Home / Self Care | Payer: Medicare HMO | Source: Ambulatory Visit | Attending: Cardiovascular Disease

## 2015-04-09 ENCOUNTER — Encounter (HOSPITAL_COMMUNITY): Payer: Self-pay | Admitting: Cardiology

## 2015-04-09 DIAGNOSIS — E785 Hyperlipidemia, unspecified: Secondary | ICD-10-CM | POA: Diagnosis not present

## 2015-04-09 DIAGNOSIS — I472 Ventricular tachycardia: Secondary | ICD-10-CM

## 2015-04-09 DIAGNOSIS — N183 Chronic kidney disease, stage 3 (moderate): Secondary | ICD-10-CM

## 2015-04-09 DIAGNOSIS — I2510 Atherosclerotic heart disease of native coronary artery without angina pectoris: Secondary | ICD-10-CM | POA: Diagnosis not present

## 2015-04-09 DIAGNOSIS — I25118 Atherosclerotic heart disease of native coronary artery with other forms of angina pectoris: Secondary | ICD-10-CM | POA: Diagnosis not present

## 2015-04-09 DIAGNOSIS — I129 Hypertensive chronic kidney disease with stage 1 through stage 4 chronic kidney disease, or unspecified chronic kidney disease: Secondary | ICD-10-CM | POA: Diagnosis not present

## 2015-04-09 HISTORY — PX: CARDIAC CATHETERIZATION: SHX172

## 2015-04-09 LAB — BASIC METABOLIC PANEL
Anion gap: 10 (ref 5–15)
BUN: 17 mg/dL (ref 6–20)
CALCIUM: 8.5 mg/dL — AB (ref 8.9–10.3)
CHLORIDE: 107 mmol/L (ref 101–111)
CO2: 22 mmol/L (ref 22–32)
Creatinine, Ser: 1.9 mg/dL — ABNORMAL HIGH (ref 0.61–1.24)
GFR calc Af Amer: 44 mL/min — ABNORMAL LOW (ref >60)
GFR calc non Af Amer: 38 mL/min — ABNORMAL LOW (ref >60)
Glucose, Bld: 87 mg/dL (ref 65–99)
POTASSIUM: 4.2 mmol/L (ref 3.5–5.1)
SODIUM: 139 mmol/L (ref 135–145)

## 2015-04-09 LAB — POCT ACTIVATED CLOTTING TIME: ACTIVATED CLOTTING TIME: 393 s

## 2015-04-09 SURGERY — CORONARY STENT INTERVENTION
Anesthesia: LOCAL

## 2015-04-09 MED ORDER — NITROGLYCERIN 1 MG/10 ML FOR IR/CATH LAB
INTRA_ARTERIAL | Status: AC
  Filled 2015-04-09: qty 10

## 2015-04-09 MED ORDER — SODIUM CHLORIDE 0.9 % IV SOLN: 250.0000 mL | INTRAVENOUS | Status: DC | PRN

## 2015-04-09 MED ORDER — BIVALIRUDIN 250 MG IV SOLR
INTRAVENOUS | Status: AC
  Filled 2015-04-09: qty 250

## 2015-04-09 MED ORDER — VERAPAMIL HCL 2.5 MG/ML IV SOLN
INTRAVENOUS | Status: DC | PRN
  Administered 2015-04-09: 15:00:00 via INTRA_ARTERIAL

## 2015-04-09 MED ORDER — BIVALIRUDIN BOLUS VIA INFUSION - CUPID
INTRAVENOUS | Status: DC | PRN
  Administered 2015-04-09: 66.375 mg via INTRAVENOUS

## 2015-04-09 MED ORDER — HEPARIN (PORCINE) IN NACL 2-0.9 UNIT/ML-% IJ SOLN
INTRAMUSCULAR | Status: AC
  Filled 2015-04-09: qty 1000

## 2015-04-09 MED ORDER — SODIUM CHLORIDE 0.9 % IJ SOLN: 3.0000 mL | INTRAMUSCULAR | Status: DC | PRN

## 2015-04-09 MED ORDER — MIDAZOLAM HCL 2 MG/2ML IJ SOLN
INTRAMUSCULAR | Status: AC
  Filled 2015-04-09: qty 2

## 2015-04-09 MED ORDER — SODIUM CHLORIDE 0.9 % IV SOLN
250.0000 mg | INTRAVENOUS | Status: DC | PRN
  Administered 2015-04-09: 1.75 mg/kg/h via INTRAVENOUS

## 2015-04-09 MED ORDER — FENTANYL CITRATE (PF) 100 MCG/2ML IJ SOLN
INTRAMUSCULAR | Status: AC
  Filled 2015-04-09: qty 2

## 2015-04-09 MED ORDER — SODIUM CHLORIDE 0.9 % IJ SOLN: 3.0000 mL | Freq: Two times a day (BID) | INTRAMUSCULAR | Status: DC

## 2015-04-09 MED ORDER — LIDOCAINE HCL (PF) 1 % IJ SOLN
INTRAMUSCULAR | Status: AC
  Filled 2015-04-09: qty 30

## 2015-04-09 MED ORDER — IOHEXOL 300 MG/ML  SOLN
INTRAMUSCULAR | Status: DC | PRN
  Administered 2015-04-09: 125 mL via INTRAVENOUS

## 2015-04-09 MED ORDER — HEPARIN SODIUM (PORCINE) 1000 UNIT/ML IJ SOLN
INTRAMUSCULAR | Status: AC
  Filled 2015-04-09: qty 1

## 2015-04-09 MED ORDER — VERAPAMIL HCL 2.5 MG/ML IV SOLN
INTRAVENOUS | Status: AC
Start: 2015-04-09 — End: 2015-04-09
  Filled 2015-04-09: qty 2

## 2015-04-09 MED ORDER — SODIUM CHLORIDE 0.9 % IV SOLN
INTRAVENOUS | Status: AC
  Administered 2015-04-09: 17:00:00 via INTRAVENOUS

## 2015-04-09 SURGICAL SUPPLY — 11 items
BALLN MINITREK RX 2.0X12 (BALLOONS) ×2
BALLOON MINITREK RX 2.0X12 (BALLOONS) ×1 IMPLANT
CATH VISTA GUIDE 6FR XB3 (CATHETERS) ×2 IMPLANT
GLIDESHEATH SLEND SS 6F .021 (SHEATH) ×2 IMPLANT
KIT ENCORE 26 ADVANTAGE (KITS) ×2 IMPLANT
KIT HEART LEFT (KITS) ×2 IMPLANT
PACK CARDIAC CATHETERIZATION (CUSTOM PROCEDURE TRAY) ×2 IMPLANT
STENT XIENCE ALPINE RX 2.5X15 (Permanent Stent) ×2 IMPLANT
TRANSDUCER W/STOPCOCK (MISCELLANEOUS) ×2 IMPLANT
TUBING CIL FLEX 10 FLL-RA (TUBING) ×2 IMPLANT
WIRE INTUITION PROPEL ST 180CM (WIRE) ×2 IMPLANT

## 2015-04-09 NOTE — Interval H&P Note (Signed)
Cath Lab Visit (complete for each Cath Lab visit)  Clinical Evaluation Leading to the Procedure:   ACS: No.  Non-ACS:    Anginal Classification: CCS III  Anti-ischemic medical therapy: Minimal Therapy (1 class of medications)  Non-Invasive Test Results: No non-invasive testing performed  Prior CABG: No previous CABG      History and Physical Interval Note:  04/09/2015 2:30 PM  Paul [name]  has presented today for surgery, with the diagnosis of CAD  The various methods of treatment have been discussed with the patient and family. After consideration of risks, benefits and other options for treatment, the patient has consented to  Procedure(s): Coronary Stent Intervention (N/A) as a surgical intervention .  The patient's history has been reviewed, patient examined, no change in status, stable for surgery.  I have reviewed the patient's chart and labs.  Questions were answered to the patient's satisfaction.     Paul [name]

## 2015-04-09 NOTE — H&P (View-Only) (Signed)
Patient Profile: 54 year old male with a history of hypertrophic cardiomyopathy and paroxysmal atrial fibrillation who was recently admitted with non-STEMI and presents for readmission for hydration and PCI of the LCX.  Subjective: No complaints. Denies CP, dyspnea and palpitations.   Objective: Vital signs in last 24 hours: Temp:  [97.7 F (36.5 C)-98.2 F (36.8 C)] 97.7 F (36.5 C) (06/01 0507) Pulse Rate:  [51-60] 51 (06/01 0507) Resp:  [18-20] 18 (06/01 0507) BP: (125-136)/(66-82) 125/82 mmHg (06/01 0507) SpO2:  [96 %-100 %] 100 % (06/01 0507) Weight:  [194 lb 0.1 oz (88 kg)-195 lb 1.7 oz (88.5 kg)] 195 lb 1.7 oz (88.5 kg) (06/01 0410) Last BM Date: 04/09/15  Intake/Output from previous day: 05/31 0701 - 06/01 0700 In: -  Out: 800 [Urine:800] Intake/Output this shift:    Medications Current Facility-Administered Medications  Medication Dose Route Frequency Provider Last Rate Last Dose  . 0.9 %  sodium chloride infusion  250 mL Intravenous PRN Ok Anis, NP      . 0.9 %  sodium chloride infusion  250 mL Intravenous PRN Ok Anis, NP      . 0.9% sodium chloride infusion  1 mL/kg/hr Intravenous Continuous Ok Anis, NP 86.6 mL/hr at 04/09/15 0544 1 mL/kg/hr at 04/09/15 0544  . acetaminophen (TYLENOL) tablet 650 mg  650 mg Oral Q4H PRN Ok Anis, NP      . aspirin EC tablet 81 mg  81 mg Oral Daily Ok Anis, NP   81 mg at 04/08/15 1745  . atorvastatin (LIPITOR) tablet 80 mg  80 mg Oral q1800 Ok Anis, NP   80 mg at 04/08/15 1800  . baclofen (LIORESAL) tablet 10 mg  10 mg Oral TID Ok Anis, NP   10 mg at 04/09/15 1036  . heparin injection 5,000 Units  5,000 Units Subcutaneous 3 times per day Ok Anis, NP   5,000 Units at 04/09/15 0545  . loratadine (CLARITIN) tablet 10 mg  10 mg Oral Daily Ok Anis, NP   10 mg at 04/09/15 1036  . metoprolol tartrate (LOPRESSOR) tablet 25 mg  25 mg  Oral BID Ok Anis, NP   25 mg at 04/09/15 1036  . montelukast (SINGULAIR) tablet 10 mg  10 mg Oral QHS Ok Anis, NP   10 mg at 04/08/15 2132  . nitroGLYCERIN (NITROSTAT) SL tablet 0.4 mg  0.4 mg Sublingual Q5 Min x 3 PRN Ok Anis, NP      . ondansetron [facility]) injection 4 mg  4 mg Intravenous Q6H PRN Ok Anis, NP      . oxyCODONE-acetaminophen (PERCOCET/ROXICET) 5-325 MG per tablet 1 tablet  1 tablet Oral Q6H PRN Iran Ouch, MD   1 tablet at 04/09/15 1047   And  . oxyCODONE (Oxy IR/ROXICODONE) immediate release tablet 2.5 mg  2.5 mg Oral Q6H PRN Iran Ouch, MD   2.5 mg at 04/09/15 1047  . sodium chloride 0.9 % injection 3 mL  3 mL Intravenous Q12H Ok Anis, NP   3 mL at 04/08/15 2137  . sodium chloride 0.9 % injection 3 mL  3 mL Intravenous PRN Ok Anis, NP      . sodium chloride 0.9 % injection 3 mL  3 mL Intravenous Q12H Ok Anis, NP   3 mL at 04/08/15 2134  . sodium chloride 0.9 % injection 3 mL  3 mL Intravenous PRN Ok Anis,  NP      . ticagrelor (BRILINTA) tablet 90 mg  90 mg Oral BID Rogelia Mire, NP   90 mg at 04/09/15 1036    PE: General appearance: alert, cooperative and no distress Neck: no carotid bruit and no JVD Lungs: clear to auscultation bilaterally Heart: regular rate and rhythm, S1, S2 normal, no murmur, click, rub or gallop Extremities: no LEE Pulses: 2+ and symmetric Skin: warm and dry Neurologic: Grossly normal  Lab Results:   Recent Labs  04/08/15 1917  WBC 9.6  HGB 10.4*  HCT 33.0*  PLT 214   BMET  Recent Labs  04/08/15 0827 04/09/15 0500  NA 141 139  K 4.0 4.2  CL 111 107  CO2 23 22  GLUCOSE 97 87  BUN 18 17  CREATININE 1.79* 1.90*  CALCIUM 9.1 8.5*   PT/INR  Recent Labs  04/08/15 1917  LABPROT 15.4*  INR 1.20     Assessment/Plan  Active Problems:   CAD (coronary artery disease)  1. NSTEMI Subsequent Episode of  Care/Coronary Artery Disease: Patient status post recent hospitalization with non-STEMI and diastolic heart failure. Catheterization revealed severe LAD and circumflex disease and he is status post LAD drug-eluting stent placement. He has residual left circumflex disease with a plan for staged intervention today. Continue ASA, Brilinta, statin and BB.   2. Essential Hypertension: well controlled on current regimen.   3. Hypertrophic Cardiomyopathy/ Chronic Diastolic CHF: Volume is stable. We did have to discontinue his Lasix secondary to acute on chronic renal failure. Of note, echocardiogram during hospitalization suggested a bright and speckled appearance of the intra-ventricular septum with recommendation for cardiac MRI. This was not performed during hospitalization given CKD and recent contrast. This can be arranged to be performed as an outpatient following his circumflex intervention, provided that renal function remains stable.  4. HLD:  LDL was 94 during recent hospitalization. He will need follow-up lipids and LFTs in approximately 4 weeks.  5. Tobacco Abuse:  He is cutting back and says he will quit. Complete cessation advised.  6. Stage III Chronic Kidney Disease: Bump in Scr from 1.79 to 1.90. His baseline is ~1.7. He has been receiving continuous IVFs. ? If rise in SCr will delay cath. Will defer to MD.   7. NSVT: 12 beat run ~4:30 am. Asymptomatic. K is WNL. Continue BB therapy.    LOS: 1 day    Brittainy M. Ladoris Gene 04/09/2015 11:06 AM  Patient seen, examined. Available data reviewed. Agree with findings, assessment, and plan as outlined by Lyda Jester, PA-C. The patient remains clinically stable. He has been receiving IV fluids overnight in anticipation of PCI today. Creatinine is essentially stable at 1.90 today. He is not on any nephrotoxic agents. Reviewed plans with the patient and his son who is at the bedside and they understand and agree to proceed.  Sherren Mocha, M.D. 04/09/2015 11:51 AM

## 2015-04-09 NOTE — Progress Notes (Signed)
Patient will not allow bed to be lowered; bed is up in the air; patient states his doctor said it was ok; advised patient about safety, patient is still unwilling to lower bed.  Rowe Pavy, RN

## 2015-04-09 NOTE — Progress Notes (Signed)
Utilization review complete. Code 44 issued, pt IP status changed to observation. Jonnie Finner RN CCM Case Mgmt phone [phone]

## 2015-04-09 NOTE — Progress Notes (Signed)
Patient Profile: 54 year old male with a history of hypertrophic cardiomyopathy and paroxysmal atrial fibrillation who was recently admitted with non-STEMI and presents for readmission for hydration and PCI of the LCX.  Subjective: No complaints. Denies CP, dyspnea and palpitations.   Objective: Vital signs in last 24 hours: Temp:  [97.7 F (36.5 C)-98.2 F (36.8 C)] 97.7 F (36.5 C) (06/01 0507) Pulse Rate:  [51-60] 51 (06/01 0507) Resp:  [18-20] 18 (06/01 0507) BP: (125-136)/(66-82) 125/82 mmHg (06/01 0507) SpO2:  [96 %-100 %] 100 % (06/01 0507) Weight:  [194 lb 0.1 oz (88 kg)-195 lb 1.7 oz (88.5 kg)] 195 lb 1.7 oz (88.5 kg) (06/01 0410) Last BM Date: 04/09/15  Intake/Output from previous day: 05/31 0701 - 06/01 0700 In: -  Out: 800 [Urine:800] Intake/Output this shift:    Medications Current Facility-Administered Medications  Medication Dose Route Frequency Provider Last Rate Last Dose  . 0.9 %  sodium chloride infusion  250 mL Intravenous PRN Rogelia Mire, NP      . 0.9 %  sodium chloride infusion  250 mL Intravenous PRN Rogelia Mire, NP      . 0.9% sodium chloride infusion  1 mL/kg/hr Intravenous Continuous Rogelia Mire, NP 86.6 mL/hr at 04/09/15 0544 1 mL/kg/hr at 04/09/15 0544  . acetaminophen (TYLENOL) tablet 650 mg  650 mg Oral Q4H PRN Rogelia Mire, NP      . aspirin EC tablet 81 mg  81 mg Oral Daily Rogelia Mire, NP   81 mg at 04/08/15 1745  . atorvastatin (LIPITOR) tablet 80 mg  80 mg Oral q1800 Rogelia Mire, NP   80 mg at 04/08/15 1800  . baclofen (LIORESAL) tablet 10 mg  10 mg Oral TID Rogelia Mire, NP   10 mg at 04/09/15 1036  . heparin injection 5,000 Units  5,000 Units Subcutaneous 3 times per day Rogelia Mire, NP   5,000 Units at 04/09/15 0545  . loratadine (CLARITIN) tablet 10 mg  10 mg Oral Daily Rogelia Mire, NP   10 mg at 04/09/15 1036  . metoprolol tartrate (LOPRESSOR) tablet 25 mg  25 mg  Oral BID Rogelia Mire, NP   25 mg at 04/09/15 1036  . montelukast (SINGULAIR) tablet 10 mg  10 mg Oral QHS Rogelia Mire, NP   10 mg at 04/08/15 2132  . nitroGLYCERIN (NITROSTAT) SL tablet 0.4 mg  0.4 mg Sublingual Q5 Min x 3 PRN Rogelia Mire, NP      . ondansetron [facility]) injection 4 mg  4 mg Intravenous Q6H PRN Rogelia Mire, NP      . oxyCODONE-acetaminophen (PERCOCET/ROXICET) 5-325 MG per tablet 1 tablet  1 tablet Oral Q6H PRN [name], MD   1 tablet at 04/09/15 1047   And  . oxyCODONE (Oxy IR/ROXICODONE) immediate release tablet 2.5 mg  2.5 mg Oral Q6H PRN [name], MD   2.5 mg at 04/09/15 1047  . sodium chloride 0.9 % injection 3 mL  3 mL Intravenous Q12H Rogelia Mire, NP   3 mL at 04/08/15 2137  . sodium chloride 0.9 % injection 3 mL  3 mL Intravenous PRN Rogelia Mire, NP      . sodium chloride 0.9 % injection 3 mL  3 mL Intravenous Q12H Rogelia Mire, NP   3 mL at 04/08/15 2134  . sodium chloride 0.9 % injection 3 mL  3 mL Intravenous PRN Rogelia Mire,  NP      . ticagrelor (BRILINTA) tablet 90 mg  90 mg Oral BID Rogelia Mire, NP   90 mg at 04/09/15 1036    PE: General appearance: alert, cooperative and no distress Neck: no carotid bruit and no JVD Lungs: clear to auscultation bilaterally Heart: regular rate and rhythm, S1, S2 normal, no murmur, click, rub or gallop Extremities: no LEE Pulses: 2+ and symmetric Skin: warm and dry Neurologic: Grossly normal  Lab Results:   Recent Labs  04/08/15 1917  WBC 9.6  HGB 10.4*  HCT 33.0*  PLT 214   BMET  Recent Labs  04/08/15 0827 04/09/15 0500  NA 141 139  K 4.0 4.2  CL 111 107  CO2 23 22  GLUCOSE 97 87  BUN 18 17  CREATININE 1.79* 1.90*  CALCIUM 9.1 8.5*   PT/INR  Recent Labs  04/08/15 1917  LABPROT 15.4*  INR 1.20     Assessment/Plan  Active Problems:   CAD (coronary artery disease)  1. NSTEMI Subsequent Episode of  Care/Coronary Artery Disease: Patient status post recent hospitalization with non-STEMI and diastolic heart failure. Catheterization revealed severe LAD and circumflex disease and he is status post LAD drug-eluting stent placement. He has residual left circumflex disease with a plan for staged intervention today. Continue ASA, Brilinta, statin and BB.   2. Essential Hypertension: well controlled on current regimen.   3. Hypertrophic Cardiomyopathy/ Chronic Diastolic CHF: Volume is stable. We did have to discontinue his Lasix secondary to acute on chronic renal failure. Of note, echocardiogram during hospitalization suggested a bright and speckled appearance of the intra-ventricular septum with recommendation for cardiac MRI. This was not performed during hospitalization given CKD and recent contrast. This can be arranged to be performed as an outpatient following his circumflex intervention, provided that renal function remains stable.  4. HLD:  LDL was 94 during recent hospitalization. He will need follow-up lipids and LFTs in approximately 4 weeks.  5. Tobacco Abuse:  He is cutting back and says he will quit. Complete cessation advised.  6. Stage III Chronic Kidney Disease: Bump in Scr from 1.79 to 1.90. His baseline is ~1.7. He has been receiving continuous IVFs. ? If rise in SCr will delay cath. Will defer to MD.   7. NSVT: 12 beat run ~4:30 am. Asymptomatic. K is WNL. Continue BB therapy.    LOS: 1 day    Brittainy M. Ladoris Gene 04/09/2015 11:06 AM  Patient seen, examined. Available data reviewed. Agree with findings, assessment, and plan as outlined by Lyda Jester, PA-C. The patient remains clinically stable. He has been receiving IV fluids overnight in anticipation of PCI today. Creatinine is essentially stable at 1.90 today. He is not on any nephrotoxic agents. Reviewed plans with the patient and his son who is at the bedside and they understand and agree to proceed.  Sherren Mocha, M.D. 04/09/2015 11:51 AM

## 2015-04-10 ENCOUNTER — Encounter (HOSPITAL_COMMUNITY): Payer: Self-pay | Admitting: Cardiovascular Disease

## 2015-04-10 DIAGNOSIS — N183 Chronic kidney disease, stage 3 unspecified: Secondary | ICD-10-CM

## 2015-04-10 DIAGNOSIS — I2510 Atherosclerotic heart disease of native coronary artery without angina pectoris: Secondary | ICD-10-CM | POA: Diagnosis not present

## 2015-04-10 DIAGNOSIS — I25118 Atherosclerotic heart disease of native coronary artery with other forms of angina pectoris: Secondary | ICD-10-CM | POA: Diagnosis not present

## 2015-04-10 DIAGNOSIS — E785 Hyperlipidemia, unspecified: Secondary | ICD-10-CM | POA: Diagnosis not present

## 2015-04-10 DIAGNOSIS — I129 Hypertensive chronic kidney disease with stage 1 through stage 4 chronic kidney disease, or unspecified chronic kidney disease: Secondary | ICD-10-CM | POA: Diagnosis not present

## 2015-04-10 HISTORY — DX: Chronic kidney disease, stage 3 unspecified: N18.30

## 2015-04-10 LAB — BASIC METABOLIC PANEL
ANION GAP: 9 (ref 5–15)
BUN: 23 mg/dL — ABNORMAL HIGH (ref 6–20)
CHLORIDE: 110 mmol/L (ref 101–111)
CO2: 22 mmol/L (ref 22–32)
CREATININE: 1.75 mg/dL — AB (ref 0.61–1.24)
Calcium: 8.7 mg/dL — ABNORMAL LOW (ref 8.9–10.3)
GFR, EST AFRICAN AMERICAN: 48 mL/min — AB (ref >60)
GFR, EST NON AFRICAN AMERICAN: 42 mL/min — AB (ref >60)
GLUCOSE: 102 mg/dL — AB (ref 65–99)
Potassium: 4.5 mmol/L (ref 3.5–5.1)
SODIUM: 141 mmol/L (ref 135–145)

## 2015-04-10 LAB — CBC
HCT: 33 % — ABNORMAL LOW (ref 39.0–52.0)
Hemoglobin: 10.6 g/dL — ABNORMAL LOW (ref 13.0–17.0)
MCH: 25.4 pg — AB (ref 26.0–34.0)
MCHC: 32.1 g/dL (ref 30.0–36.0)
MCV: 79.1 fL (ref 78.0–100.0)
Platelets: 182 10*3/uL (ref 150–400)
RBC: 4.17 MIL/uL — ABNORMAL LOW (ref 4.22–5.81)
RDW: 14.2 % (ref 11.5–15.5)
WBC: 10.2 10*3/uL (ref 4.0–10.5)

## 2015-04-10 MED ORDER — ACETAMINOPHEN 325 MG PO TABS: 650.0000 mg | ORAL_TABLET | ORAL | Status: DC | PRN

## 2015-04-10 MED ORDER — BACLOFEN 10 MG PO TABS: 10.0000 mg | ORAL_TABLET | Freq: Three times a day (TID) | ORAL | Status: DC | PRN

## 2015-04-10 MED ORDER — PREDNISONE 50 MG PO TABS: 50.0000 mg | ORAL_TABLET | ORAL | Status: DC | PRN

## 2015-04-10 MED ORDER — CIALIS 5 MG PO TABS: 5.0000 mg | ORAL_TABLET | Freq: Every day | ORAL | Status: DC | PRN

## 2015-04-10 MED FILL — Heparin Sodium (Porcine) 2 Unit/ML in Sodium Chloride 0.9%: INTRAMUSCULAR | Qty: 1000 | Status: AC

## 2015-04-10 MED FILL — Lidocaine HCl Local Preservative Free (PF) Inj 1%: INTRAMUSCULAR | Qty: 30 | Status: AC

## 2015-04-10 NOTE — Discharge Summary (Signed)
Patient ID: Paul [name],  MRN: [id number], DOB/AGE: 01-03-1961 54 y.o.  Admit date: 04/08/2015 Discharge date: 04/10/2015  Primary Care Provider: Beverlyn Roux, MD Primary Cardiologist: Dr Irish Lack  Discharge Diagnoses Active Problems:   CAD - S/P LAD DES May 2016, staged CFX DES June 2016   Hypertensive cardiomyopathy   Essential hypertension   Chronic renal insufficiency, stage III (moderate)   Hyperlipidemia   ERECTILE DYSFUNCTION   PAF- last in 2013   Arthritis of left hip   Wandering (atrial) pacemaker    Procedures: Elective staged CFX PCI/DES 04/09/15   Hospital Course:  54 year old AA male with a history of HTN with HCVD, LVH,  PAF in 2013 in the setting of post op ileus, and DJD s/p Lt [facility] Sept 2015 .  He was admitted to [facility] 03/17/15 with CHF and a non-ST elevation MI. He underwent LAD PCI 03/19/15. He had residual CFX disease. The plan was for staged intervention of the LCX. He was discharged the following day. We did send him out on Lasix however follow-up creatinine on May 16 was elevated at 2.27. He was contacted and advised to discontinue Lasix and potassium. His SCr had been slow to come down to his prior baseline and thusstaged PCI was been delayed. BMET done on 04/08/15 showed a creat of 1.79, down from 2.15 on 5/23.He was admitted as an OP 04/08/15 overnight hydration. PCI of the CFX was done 04/09/15 by Dr Fletcher Anon and the pt tolerated this well. SCr on 6/2 was 1.75 and the pt was cleared for discharge by Dr Burt Knack. He will follow up in the office in two weeks with C.Berge or Dr Irish Lack.    Discharge Vitals:  Blood pressure 176/85, pulse 55, temperature 97.3 F (36.3 C), temperature source Oral, resp. rate 18, height 5' 11.5" (1.816 m), weight 196 lb 6.9 oz (89.1 kg), SpO2 100 %.    Labs: Results for orders placed or performed during the hospital encounter of 04/08/15 (from the past 24 hour(s))  POCT Activated clotting time     Status: None   Collection Time: 04/09/15  2:48 PM  Result Value Ref Range   Activated Clotting Time 393 seconds  Basic metabolic panel     Status: Abnormal   Collection Time: 04/10/15  3:46 AM  Result Value Ref Range   Sodium 141 135 - 145 mmol/L   Potassium 4.5 3.5 - 5.1 mmol/L   Chloride 110 101 - 111 mmol/L   CO2 22 22 - 32 mmol/L   Glucose, Bld 102 (H) 65 - 99 mg/dL   BUN 23 (H) 6 - 20 mg/dL   Creatinine, Ser 1.75 (H) 0.61 - 1.24 mg/dL   Calcium 8.7 (L) 8.9 - 10.3 mg/dL   GFR calc non Af Amer 42 (L) >60 mL/min   GFR calc Af Amer 48 (L) >60 mL/min   Anion gap 9 5 - 15  CBC     Status: Abnormal   Collection Time: 04/10/15  3:46 AM  Result Value Ref Range   WBC 10.2 4.0 - 10.5 K/uL   RBC 4.17 (L) 4.22 - 5.81 MIL/uL   Hemoglobin 10.6 (L) 13.0 - 17.0 g/dL   HCT 33.0 (L) 39.0 - 52.0 %   MCV 79.1 78.0 - 100.0 fL   MCH 25.4 (L) 26.0 - 34.0 pg   MCHC 32.1 30.0 - 36.0 g/dL   RDW 14.2 11.5 - 15.5 %   Platelets 182 150 - 400 K/uL  Disposition:  Follow-up Information    Follow up with Jettie Booze., MD.   Specialties:  Cardiology, Radiology, Interventional Cardiology   Why:  office will conatct you to see C.Berge or Dr Carlis Stable information:   [address] [place] [address] [phone]       Discharge Medications:    Medication List    STOP taking these medications        meloxicam 15 MG tablet  Commonly known as:  MOBIC      TAKE these medications        acetaminophen 325 MG tablet  Commonly known as:  TYLENOL  Take 2 tablets (650 mg total) by mouth every 4 (four) hours as needed for headache or mild pain.     aspirin 81 MG EC tablet  Take 1 tablet (81 mg total) by mouth daily.     atorvastatin 80 MG tablet  Commonly known as:  LIPITOR  Take 1 tablet (80 mg total) by mouth daily at 6 PM.     baclofen 10 MG tablet  Commonly known as:  LIORESAL  Take 1 tablet (10 mg total) by mouth 3 (three) times daily as needed for muscle spasms.       cetirizine 10 MG tablet  Commonly known as:  ZYRTEC  Take 1 tablet (10 mg total) by mouth daily.     CIALIS 5 MG tablet  Generic drug:  tadalafil  Take 1 tablet (5 mg total) by mouth daily as needed for erectile dysfunction.     fluticasone 50 MCG/ACT nasal spray  Commonly known as:  FLONASE  Place 1 spray into both nostrils daily.     metoprolol tartrate 25 MG tablet  Commonly known as:  LOPRESSOR  Take 1 tablet (25 mg total) by mouth 2 (two) times daily.     montelukast 10 MG tablet  Commonly known as:  SINGULAIR  Take 1 tablet (10 mg total) by mouth at bedtime.     nitroGLYCERIN 0.4 MG SL tablet  Commonly known as:  NITROSTAT  Place 1 tablet (0.4 mg total) under the tongue every 5 (five) minutes x 3 doses as needed for chest pain.     oxyCODONE-acetaminophen 7.5-325 MG per tablet  Commonly known as:  PERCOCET  Take 1 tablet by mouth every 6 (six) hours as needed for severe pain.     predniSONE 50 MG tablet  Commonly known as:  DELTASONE  Take 1 tablet (50 mg total) by mouth as needed. Use only as directed by your primary care provider     ticagrelor 90 MG Tabs tablet  Commonly known as:  BRILINTA  Take 1 tablet (90 mg total) by mouth 2 (two) times daily.         Duration of Discharge Encounter: Greater than 30 minutes including physician time.  Angelena Form PA-C 04/10/2015 8:02 AM

## 2015-04-10 NOTE — Discharge Instructions (Signed)
Coronary Angiogram With Stent, Care After Refer to this sheet in the next few weeks. These instructions provide you with information on caring for yourself after your procedure. Your health care provider may also give you more specific instructions. Your treatment has been planned according to current medical practices, but problems sometimes occur. Call your health care provider if you have any problems or questions after your procedure.  WHAT TO EXPECT AFTER THE PROCEDURE  The insertion site may be tender for a few days after your procedure. HOME CARE INSTRUCTIONS   Take medicines only as directed by your health care provider. Blood thinners may be prescribed after your procedure to improve blood flow through the stent.  Change any bandages (dressings) as directed by your health care provider.   Check your insertion site every day for redness, swelling, or fluid leaking from the insertion.   Do not take baths, swim, or use a hot tub until your health care provider approves. You may shower. Pat the insertion area dry. Do not rub the insertion area with a washcloth or towel.   Eat a heart-healthy diet. This should include plenty of fresh fruits and vegetables. Meat should be lean cuts. Avoid the following types of food:   Food that is high in salt.   Canned or highly processed food.   Food that is high in saturated fat or sugar.   Fried food.   Make any other lifestyle changes recommended by your health care provider. This may include:   Not using any tobacco products including cigarettes, chewing tobacco, or electronic cigarettes.  Managing your weight.   Getting regular exercise.   Managing your blood pressure.   Limiting your alcohol intake.   Managing other health problems, such as diabetes.   If you need an MRI after your heart stent was placed, be sure to tell the health care provider who orders the MRI that you have a heart stent.   Keep all follow-up  visits as directed by your health care provider.  SEEK IMMEDIATE MEDICAL CARE IF:   You develop chest pain, shortness of breath, feel faint, or pass out.  You have bleeding, swelling larger than a walnut, or drainage from the catheter insertion site.  You develop pain, discoloration, coldness, or severe bruising in the leg or arm that held the catheter.  You develop bleeding from any other place such as from the bowels. There may be bright red blood in the urine or stools, or it may appear as black, tarry stools.  You have a fever or chills. MAKE SURE YOU:  Understand these instructions.  Will watch your condition.  Will get help right away if you are not doing well or get worse. Document Released: 05/14/2005 Document Revised: 03/11/2014 Document Reviewed: 03/28/2013 [facility] Patient Information 2015 [place], [place]. This information is not intended to replace advice given to you by your health care provider. Make sure you discuss any questions you have with your health care provider.

## 2015-04-10 NOTE — Progress Notes (Signed)
CARDIAC REHAB PHASE I   PRE:  Rate/Rhythm: 59-62 SR PACS  BP:  Supine:   Sitting: 147/67  Standing:    SaO2:   MODE:  Ambulation: 1000 ft   POST:  Rate/Rhythm: 75 SR PACS  BP:  Supine:   Sitting: 412/82,.081/38  Standing:    SaO2:  0800-0900 Pt walked 1000 ft independently without CP. Did notice some DOE when walking and talking. Cardiac Rehab saw pt on last admission 5/11 and 5/12 so brief review done. Answered pt's questions. Pt is considering CRP 2 and allowed referral to [place]. Will send them update that he has had staged PCI. Re enforced the importance of brilinta with stents. Reviewed NTG use and importance of not smoking. Pt stated he has cut back and is working on quitting completely.    Graylon Good, RN BSN  04/10/2015 8:53 AM

## 2015-04-10 NOTE — Progress Notes (Signed)
    Subjective:  No CP or dyspnea. Feels well this am.  Objective:  Vital Signs in the last 24 hours: Temp:  [97.3 F (36.3 C)-98.3 F (36.8 C)] 97.3 F (36.3 C) (06/02 0452) Pulse Rate:  [0-69] 55 (06/02 0452) Resp:  [0-58] 18 (06/02 0452) BP: (117-176)/(68-92) 176/85 mmHg (06/02 0452) SpO2:  [0 %-100 %] 100 % (06/02 0452) Weight:  [196 lb 6.9 oz (89.1 kg)] 196 lb 6.9 oz (89.1 kg) (06/02 0037)  Intake/Output from previous day: 06/01 0701 - 06/02 0700 In: 999.8 [P.O.:240; I.V.:664.8] Out: 2800 [Urine:2800]  Physical Exam: Pt is alert and oriented, NAD HEENT: normal Neck: JVP - normal Lungs: CTA bilaterally CV: RRR without murmur or gallop Abd: soft, NT, Positive BS, no hepatomegaly Ext: no C/C/E, right radial site clear Skin: warm/dry no rash   Lab Results:  Recent Labs  04/08/15 1917 04/10/15 0346  WBC 9.6 10.2  HGB 10.4* 10.6*  PLT 214 182    Recent Labs  04/09/15 0500 04/10/15 0346  NA 139 141  K 4.2 4.5  CL 107 110  CO2 22 22  GLUCOSE 87 102*  BUN 17 23*  CREATININE 1.90* 1.75*   No results for input(s): TROPONINI in the last 72 hours.  Invalid input(s): CK, MB  Assessment/Plan:  1. CAD, native vessel: now s/p PCI of the LCx, severe stenosis treated with DES. Continue current Rx which includes DAPT.  2. CKD, Stage 3: renal fxn stable post-PCI.   Other problems stable. Follow-up Berge/[place]. meds reviewed and appropriate with ASA, ticagrelor, high-intensity statin, and beta-blocker.   Sherren Mocha, M.D. 04/10/2015, 6:44 AM

## 2015-04-29 ENCOUNTER — Ambulatory Visit (INDEPENDENT_AMBULATORY_CARE_PROVIDER_SITE_OTHER): Payer: Medicare HMO | Admitting: Physician Assistant

## 2015-04-29 ENCOUNTER — Encounter: Payer: Self-pay | Admitting: Physician Assistant

## 2015-04-29 VITALS — BP 100/60 | HR 54 | Ht 71.5 in | Wt 190.1 lb

## 2015-04-29 DIAGNOSIS — I43 Cardiomyopathy in diseases classified elsewhere: Secondary | ICD-10-CM

## 2015-04-29 DIAGNOSIS — N189 Chronic kidney disease, unspecified: Secondary | ICD-10-CM

## 2015-04-29 DIAGNOSIS — Z720 Tobacco use: Secondary | ICD-10-CM

## 2015-04-29 DIAGNOSIS — E785 Hyperlipidemia, unspecified: Secondary | ICD-10-CM

## 2015-04-29 DIAGNOSIS — Z9861 Coronary angioplasty status: Secondary | ICD-10-CM

## 2015-04-29 DIAGNOSIS — I5032 Chronic diastolic (congestive) heart failure: Secondary | ICD-10-CM | POA: Diagnosis not present

## 2015-04-29 DIAGNOSIS — I119 Hypertensive heart disease without heart failure: Secondary | ICD-10-CM

## 2015-04-29 DIAGNOSIS — I5031 Acute diastolic (congestive) heart failure: Secondary | ICD-10-CM | POA: Diagnosis not present

## 2015-04-29 DIAGNOSIS — F17200 Nicotine dependence, unspecified, uncomplicated: Secondary | ICD-10-CM

## 2015-04-29 DIAGNOSIS — I429 Cardiomyopathy, unspecified: Secondary | ICD-10-CM

## 2015-04-29 DIAGNOSIS — I2510 Atherosclerotic heart disease of native coronary artery without angina pectoris: Secondary | ICD-10-CM

## 2015-04-29 DIAGNOSIS — N183 Chronic kidney disease, stage 3 unspecified: Secondary | ICD-10-CM

## 2015-04-29 NOTE — Progress Notes (Signed)
Cardiology Office Note   Date:  04/29/2015   ID:  Paul [name], DOB Jun 24, 1961, MRN [id number]  PCP:  Paul Roux, MD  Cardiologist:  Paul [name]   Chief Complaint: shortness of breath    History of Present Illness: Paul [name] is a 54 y.o. male who presents for post hospital follow up. Patient was admitted to the hospital with a non-ST elevation MI and heart failure on 03/17/15. He underwent stenting to the LAD on 03/19/15. He had residual circumflex disease and was brought back for staged intervention. He does have chronic renal insufficiency and creatinine had gone up to 2.27. He was readmitted on 04/08/15 for overnight hydration and underwent stenting of the circumflex and 04/09/15. Follow-up creatinine was 1.75 and he was discharged.  Patient comes in today feeling quite well. He is a little bit anxious and says he sometimes notices he is short of breath. He is very nonspecific with this. He is exercising without problems. He says he gets chest pain when he he eats fast and this has been a chronic problem for him. He denies any chest tightness or pressure like what brought him to the hospital. He does not like medications and is hoping to come off most of them soon. He was also hoping to have hip surgery later this summer. I told him this was not reasonable after having 2 stents and an MI. He will have to wait at least 6 months for any procedures and discuss with Paul [name]. Patient continues to smoke several cigarettes daily. Patient also has history of hypertrophic cardiomyopathy and paroxysmal atrial fibrillation. Echo in the past that showed normal LV function with severe LVH and severe biatrial enlargement.   Past Medical History  Diagnosis Date  . CKD (chronic kidney disease), stage III   . PAF (paroxysmal atrial fibrillation)     remote isolated episode - 09/2012.  Marland Kitchen Hypertension     denies  . GERD (gastroesophageal reflux disease)     occ  . Arthritis   . TMJ  arthralgia   . HOCM (hypertrophic obstructive cardiomyopathy)     a. 03/2015 Echo: EF 65-60%, sev LV thickening w/ bright, speckled appearance of IV septum, Gr 2 DD, AoV sclerosis, triv AI, mild MR, LA 37ml/m^2, RA 49ml/m^2, aneurysmal IAS, mod TR, PASP 76mmHg.  Marland Kitchen CAD (coronary artery disease)     a. 03/2015 NSTEMI/PCI: LM nl, LAD 95p (3.5x20 Promus DES), D1 60, D2/3 mild dzs, LCX 65m (plan for staged pci), OM1/2 min irregs, RCA 20p, RPDA 60.    Past Surgical History  Procedure Laterality Date  . Hip surgery Left 72    lft   . Total hip arthroplasty Right 03,01    x2  . Appendectomy    . Laparoscopy  09/22/2012    Procedure: LAPAROSCOPY DIAGNOSTIC;  Surgeon: Harl Bowie, MD;  Location: [facility];  Service: General;  Laterality: N/A;  . Laparoscopic appendectomy  09/22/2012    Procedure: APPENDECTOMY LAPAROSCOPIC;  Surgeon: Harl Bowie, MD;  Location: [facility];  Service: General;;  . Total hip arthroplasty with hardware removal Left 08/05/2014    Procedure: TOTAL HIP ARTHROPLASTY WITH HARDWARE REMOVAL;  Surgeon: Kerin Salen, MD;  Location: [facility];  Service: Orthopedics;  Laterality: Left;  . Cardiac catheterization N/A 03/19/2015    Procedure: Left Heart Cath and Coronary Angiography;  Surgeon: [name], MD;  Location: [facility];  Service: Cardiovascular;  Laterality: N/A;  . Cardiac catheterization  03/19/2015  Procedure: Coronary Stent Intervention;  Surgeon: [name], MD;  Location: [facility];  Service: Cardiovascular;;  . Cardiac catheterization N/A 04/09/2015    Procedure: Coronary Stent Intervention;  Surgeon: [name], MD;  Location: [facility];  Service: Cardiovascular;  Laterality: N/A;     Current Outpatient Prescriptions  Medication Sig Dispense Refill  . acetaminophen (TYLENOL) 325 MG tablet Take 2 tablets (650 mg total) by mouth every 4 (four) hours as needed for headache or mild pain.    Marland Kitchen aspirin EC 81 MG EC tablet  Take 1 tablet (81 mg total) by mouth daily.    Marland Kitchen atorvastatin (LIPITOR) 80 MG tablet Take 1 tablet (80 mg total) by mouth daily at 6 PM. 30 tablet 6  . baclofen (LIORESAL) 10 MG tablet Take 1 tablet (10 mg total) by mouth 3 (three) times daily as needed for muscle spasms. 90 each 2  . cetirizine (ZYRTEC) 10 MG tablet Take 1 tablet (10 mg total) by mouth daily. 30 tablet 11  . CIALIS 5 MG tablet Take 1 tablet (5 mg total) by mouth daily as needed for erectile dysfunction. 10 tablet 0  . fluticasone (FLONASE) 50 MCG/ACT nasal spray Place 1 spray into both nostrils daily.    . meloxicam (MOBIC) 15 MG tablet Take 15 mg by mouth daily.    . metoprolol tartrate (LOPRESSOR) 25 MG tablet Take 1 tablet (25 mg total) by mouth 2 (two) times daily. 60 tablet 6  . montelukast (SINGULAIR) 10 MG tablet Take 1 tablet (10 mg total) by mouth at bedtime. 30 tablet 12  . nitroGLYCERIN (NITROSTAT) 0.4 MG SL tablet Place 1 tablet (0.4 mg total) under the tongue every 5 (five) minutes x 3 doses as needed for chest pain. 25 tablet 3  . oxyCODONE-acetaminophen (PERCOCET) 7.5-325 MG per tablet Take 1 tablet by mouth every 6 (six) hours as needed for severe pain. 100 tablet 0  . predniSONE (DELTASONE) 50 MG tablet Take 1 tablet (50 mg total) by mouth as needed. Use only as directed by your primary care provider  0  . ticagrelor (BRILINTA) 90 MG TABS tablet Take 1 tablet (90 mg total) by mouth 2 (two) times daily. 60 tablet 6   No current facility-administered medications for this visit.    Allergies:   Ibuprofen and Nsaids    Social History:  The patient  reports that he has been smoking Cigarettes.  He has a 2.5 pack-year smoking history. He has never used smokeless tobacco. He reports that he drinks about 1.2 oz of alcohol per week. He reports that he does not use illicit drugs.   Family History:  The patient's family history includes Heart disease in his father and mother.    ROS:  Please see the history of present  illness.   Otherwise, review of systems are positive for allergies from sleeping with the windows open and the fan on. Chronic arthritis back pain and hip pain  All other systems are reviewed and negative.    PHYSICAL EXAM: VS:  BP 100/60 mmHg  Pulse 54  Ht 5' 11.5" (1.816 m)  Wt 190 lb 1.9 oz (86.238 kg)  BMI 26.15 kg/m2 , BMI Body mass index is 26.15 kg/(m^2). GEN: Well nourished, well developed, in no acute distress Neck: no JVD, HJR, carotid bruits, or masses Cardiac:RRR; 2/6 systolic murmur at the left sternal border, positive S4, 1/6 diastolic murmur, no rubs, thrill or heave,  Respiratory:  clear to auscultation bilaterally,  normal work of breathing GI: soft, nontender, nondistended, + BS MS: no deformity or atrophy Extremities: Right arm at cath site without hematoma or hemorrhage, good radial and brachial pulses otherwise lower extremities without cyanosis, clubbing, edema, good distal pulses bilaterally.  Skin: warm and dry, no rash Neuro:  Strength and sensation are intact    EKG:  EKG is ordered today. The ekg ordered today demonstrates sinus bradycardia at 54 bpm with PAC and LVH and old anterior septal infarct   Recent Labs: 03/17/2015: ALT 41; B Natriuretic Peptide 1269.3* 03/18/2015: TSH 0.401 03/20/2015: Magnesium 1.8 04/10/2015: BUN 23*; Creatinine, Ser 1.75*; Hemoglobin 10.6*; Platelets 182; Potassium 4.5; Sodium 141    Lipid Panel    Component Value Date/Time   CHOL 162 03/18/2015 0238   TRIG 41 03/18/2015 0238   HDL 60 03/18/2015 0238   CHOLHDL 2.7 03/18/2015 0238   VLDL 8 03/18/2015 0238   LDLCALC 94 03/18/2015 0238   LDLDIRECT 111* 03/31/2007 2119      Wt Readings from Last 3 Encounters:  04/29/15 190 lb 1.9 oz (86.238 kg)  04/10/15 196 lb 6.9 oz (89.1 kg)  03/27/15 191 lb (86.637 kg)      Other studies Reviewed: Additional studies/ records that were reviewed  Prox RCA lesion, 20% stenosed.  RPDA lesion, 60% stenosed.  1st Diag lesion, 60%  stenosed.  Mid Cx lesion, 90% stenosed. There is a 0% residual stenosis post intervention.  A drug-eluting stent was placed.   1. Patent LAD stent   2. Successful angioplasty and drug-eluting stent placement to the mid left circumflex.  Recommendations: Hydrated overnight, aggressive medical therapy. Dual antiplatelets therapy for at least 1 years.           ASSESSMENT AND PLAN:   Sumner Boast, PA-C  04/29/2015 9:52 AM    [facility] [place], [place], [place]  [address] Phone: [phone]; Fax: [phone]

## 2015-04-29 NOTE — Assessment & Plan Note (Signed)
No evidence of heart failure on exam

## 2015-04-29 NOTE — Assessment & Plan Note (Signed)
Patient is stable after recent staged DES to the LAD 04/08/15 and DES to the circumflex and 04/09/15. No further chest pain. Continue Brilinta, Lipitor, aspirin and metoprolol. Follow-up with Dr.Varanasi in 2-3 months.

## 2015-04-29 NOTE — Assessment & Plan Note (Signed)
Check fasting lipid panel and LFTs in 6 weeks.

## 2015-04-29 NOTE — Assessment & Plan Note (Signed)
Blood pressure on the low side today

## 2015-04-29 NOTE — Assessment & Plan Note (Signed)
Creatinine was down to 1.75 at discharge.

## 2015-04-29 NOTE — Assessment & Plan Note (Signed)
Smoking cessation discussed 

## 2015-04-29 NOTE — Patient Instructions (Signed)
Medication Instructions:   Your physician recommends that you continue on your current medications as directed. Please refer to the Current Medication list given to you today.    Labwork:  4 TO 6 WEEKS FASTING LABS LIPIDS AND LFT   Testing/Procedures:   Follow-Up:  WITH DR Irish Lack   3 TO 4 MONTHS   Any Other Special Instructions Will Be Listed Below (If Applicable).  Smoking Cessation Quitting smoking is important to your health and has many advantages. However, it is not always easy to quit since nicotine is a very addictive drug. Oftentimes, people try 3 times or more before being able to quit. This document explains the best ways for you to prepare to quit smoking. Quitting takes hard work and a lot of effort, but you can do it. ADVANTAGES OF QUITTING SMOKING  You will live longer, feel better, and live better.  Your body will feel the impact of quitting smoking almost immediately.  Within 20 minutes, blood pressure decreases. Your pulse returns to its normal level.  After 8 hours, carbon monoxide levels in the blood return to normal. Your oxygen level increases.  After 24 hours, the chance of having a heart attack starts to decrease. Your breath, hair, and body stop smelling like smoke.  After 48 hours, damaged nerve endings begin to recover. Your sense of taste and smell improve.  After 72 hours, the body is virtually free of nicotine. Your bronchial tubes relax and breathing becomes easier.  After 2 to 12 weeks, lungs can hold more air. Exercise becomes easier and circulation improves.  The risk of having a heart attack, stroke, cancer, or lung disease is greatly reduced.  After 1 year, the risk of coronary heart disease is cut in half.  After 5 years, the risk of stroke falls to the same as a nonsmoker.  After 10 years, the risk of lung cancer is cut in half and the risk of other cancers decreases significantly.  After 15 years, the risk of coronary heart  disease drops, usually to the level of a nonsmoker.  If you are pregnant, quitting smoking will improve your chances of having a healthy baby.  The people you live with, especially any children, will be healthier.  You will have extra money to spend on things other than cigarettes. QUESTIONS TO THINK ABOUT BEFORE ATTEMPTING TO QUIT You may want to talk about your answers with your health care provider.  Why do you want to quit?  If you tried to quit in the past, what helped and what did not?  What will be the most difficult situations for you after you quit? How will you plan to handle them?  Who can help you through the tough times? Your family? Friends? A health care provider?  What pleasures do you get from smoking? What ways can you still get pleasure if you quit? Here are some questions to ask your health care provider:  How can you help me to be successful at quitting?  What medicine do you think would be best for me and how should I take it?  What should I do if I need more help?  What is smoking withdrawal like? How can I get information on withdrawal? GET READY  Set a quit date.  Change your environment by getting rid of all cigarettes, ashtrays, matches, and lighters in your home, car, or work. Do not let people smoke in your home.  Review your past attempts to quit. Think about what worked  and what did not. GET SUPPORT AND ENCOURAGEMENT You have a better chance of being successful if you have help. You can get support in many ways.  Tell your family, friends, and coworkers that you are going to quit and need their support. Ask them not to smoke around you.  Get individual, group, or telephone counseling and support. Programs are available at [employer] and health centers. Call your local health department for information about programs in your area.  Spiritual beliefs and practices may help some smokers quit.  Download a "quit meter" on your computer to  keep track of quit statistics, such as how long you have gone without smoking, cigarettes not smoked, and money saved.  Get a self-help book about quitting smoking and staying off tobacco. LEARN NEW SKILLS AND BEHAVIORS  Distract yourself from urges to smoke. Talk to someone, go for a walk, or occupy your time with a task.  Change your normal routine. Take a different route to work. Drink tea instead of coffee. Eat breakfast in a different place.  Reduce your stress. Take a hot bath, exercise, or read a book.  Plan something enjoyable to do every day. Reward yourself for not smoking.  Explore interactive web-based programs that specialize in helping you quit. GET MEDICINE AND USE IT CORRECTLY Medicines can help you stop smoking and decrease the urge to smoke. Combining medicine with the above behavioral methods and support can greatly increase your chances of successfully quitting smoking.  Nicotine replacement therapy helps deliver nicotine to your body without the negative effects and risks of smoking. Nicotine replacement therapy includes nicotine gum, lozenges, inhalers, nasal sprays, and skin patches. Some may be available over-the-counter and others require a prescription.  Antidepressant medicine helps people abstain from smoking, but how this works is unknown. This medicine is available by prescription.  Nicotinic receptor partial agonist medicine simulates the effect of nicotine in your brain. This medicine is available by prescription. Ask your health care provider for advice about which medicines to use and how to use them based on your health history. Your health care provider will tell you what side effects to look out for if you choose to be on a medicine or therapy. Carefully read the information on the package. Do not use any other product containing nicotine while using a nicotine replacement product.  RELAPSE OR DIFFICULT SITUATIONS Most relapses occur within the first 3  months after quitting. Do not be discouraged if you start smoking again. Remember, most people try several times before finally quitting. You may have symptoms of withdrawal because your body is used to nicotine. You may crave cigarettes, be irritable, feel very hungry, cough often, get headaches, or have difficulty concentrating. The withdrawal symptoms are only temporary. They are strongest when you first quit, but they will go away within 10-14 days. To reduce the chances of relapse, try to:  Avoid drinking alcohol. Drinking lowers your chances of successfully quitting.  Reduce the amount of caffeine you consume. Once you quit smoking, the amount of caffeine in your body increases and can give you symptoms, such as a rapid heartbeat, sweating, and anxiety.  Avoid smokers because they can make you want to smoke.  Do not let weight gain distract you. Many smokers will gain weight when they quit, usually less than 10 pounds. Eat a healthy diet and stay active. You can always lose the weight gained after you quit.  Find ways to improve your mood other than smoking. FOR MORE  INFORMATION  www.smokefree.gov  Document Released: 10/19/2001 Document Revised: 03/11/2014 Document Reviewed: 02/03/2012 [facility] Patient Information 2015 [place], [place]. This information is not intended to replace advice given to you by your health care provider. Make sure you discuss any questions you have with your health care provider.

## 2015-06-06 ENCOUNTER — Other Ambulatory Visit: Payer: Medicare HMO

## 2015-06-09 ENCOUNTER — Telehealth: Payer: Self-pay | Admitting: Family Medicine

## 2015-06-16 ENCOUNTER — Other Ambulatory Visit: Payer: Self-pay | Admitting: Family Medicine

## 2015-06-16 ENCOUNTER — Encounter: Payer: Self-pay | Admitting: Family Medicine

## 2015-06-16 ENCOUNTER — Ambulatory Visit (INDEPENDENT_AMBULATORY_CARE_PROVIDER_SITE_OTHER): Payer: Medicare HMO | Admitting: Family Medicine

## 2015-06-16 VITALS — BP 143/87 | HR 53 | Temp 98.1°F | Ht 72.0 in | Wt 189.0 lb

## 2015-06-16 DIAGNOSIS — M25552 Pain in left hip: Secondary | ICD-10-CM | POA: Diagnosis not present

## 2015-06-16 DIAGNOSIS — Z113 Encounter for screening for infections with a predominantly sexual mode of transmission: Secondary | ICD-10-CM

## 2015-06-16 DIAGNOSIS — N183 Chronic kidney disease, stage 3 unspecified: Secondary | ICD-10-CM

## 2015-06-16 DIAGNOSIS — N529 Male erectile dysfunction, unspecified: Secondary | ICD-10-CM

## 2015-06-16 DIAGNOSIS — N189 Chronic kidney disease, unspecified: Secondary | ICD-10-CM | POA: Diagnosis not present

## 2015-06-16 DIAGNOSIS — M25551 Pain in right hip: Secondary | ICD-10-CM | POA: Diagnosis not present

## 2015-06-16 LAB — BASIC METABOLIC PANEL
BUN: 21 mg/dL (ref 7–25)
CO2: 22 mmol/L (ref 20–31)
CREATININE: 1.92 mg/dL — AB (ref 0.70–1.33)
Calcium: 9.1 mg/dL (ref 8.6–10.3)
Chloride: 111 mmol/L — ABNORMAL HIGH (ref 98–110)
GLUCOSE: 81 mg/dL (ref 65–99)
POTASSIUM: 4.5 mmol/L (ref 3.5–5.3)
SODIUM: 142 mmol/L (ref 135–146)

## 2015-06-16 LAB — C-REACTIVE PROTEIN

## 2015-06-16 LAB — POCT SEDIMENTATION RATE: POCT SED RATE: 0 mm/h (ref 0–22)

## 2015-06-16 LAB — HEPATITIS C ANTIBODY: HCV Ab: NEGATIVE

## 2015-06-16 MED ORDER — SILDENAFIL CITRATE 100 MG PO TABS: 100.0000 mg | ORAL_TABLET | Freq: Every day | ORAL | Status: DC | PRN

## 2015-06-16 MED ORDER — OXYCODONE-ACETAMINOPHEN 7.5-325 MG PO TABS: 1.0000 | ORAL_TABLET | Freq: Four times a day (QID) | ORAL | Status: DC | PRN

## 2015-06-16 MED ORDER — PREDNISONE 50 MG PO TABS: 50.0000 mg | ORAL_TABLET | ORAL | Status: DC | PRN

## 2015-06-16 NOTE — Patient Instructions (Signed)

## 2015-06-16 NOTE — Telephone Encounter (Signed)
Needs Viagra refilled, he states either the 50 or the $Remo'100mg'WdJsE$ . Sadie Reynolds, ASA

## 2015-06-17 ENCOUNTER — Telehealth: Payer: Self-pay | Admitting: *Deleted

## 2015-06-17 LAB — HIV ANTIBODY (ROUTINE TESTING W REFLEX): HIV 1&2 Ab, 4th Generation: NONREACTIVE

## 2015-06-17 NOTE — Telephone Encounter (Signed)
Prior Authorization received from [employer] for Viagra.  PA form placed in provider box for completion. Derl Barrow, RN

## 2015-06-18 ENCOUNTER — Encounter: Payer: Self-pay | Admitting: *Deleted

## 2015-06-20 ENCOUNTER — Encounter: Payer: Self-pay | Admitting: Family Medicine

## 2015-06-20 NOTE — Assessment & Plan Note (Addendum)
Taking perc rarely for hip pain, flares periodically, requesting more prednisone as this works best - refilled percocet and prednisone - cautioned again about chronic use of either, he will continue to use sparingly - will check ESR/CRP since dramatic response to glucocorticoids raises possibility of inflammatory process (PR, AS)

## 2015-06-20 NOTE — Assessment & Plan Note (Signed)
Followed by renal, no symptoms or acute changes - check bmet

## 2015-06-20 NOTE — Progress Notes (Signed)
   Subjective:   Paul [name] is a 54 y.o. 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  Patient reports no significant change in his bilateral hip pain. Recently the left has been worse than right and it is quite stiff in the morning or when he first stands up to start moving. The percocet helps some to control the pain. The prednisone is most effective.   Review of Systems:  Per HPI. All other systems reviewed and are negative.   PMH, PSH, Medications, Allergies, and FmHx reviewed and updated in EMR.  Social History: current smoker  Objective:  BP 143/87 mmHg  Pulse 53  Temp(Src) 98.1 F (36.7 C) (Oral)  Ht 6' (1.829 m)  Wt 189 lb (85.73 kg)  BMI 25.63 kg/m2  Gen:  54 y.o. male in NAD HEENT: NCAT, MMM, EOMI, PERRL, anicteric sclerae CV: RRR, no MRG, no JVD Resp: Non-labored, CTAB, no wheezes noted Abd: Soft, NTND, BS present, no guarding or organomegaly Ext: WWP, no edema MSK: strength intact, limited ROM at hips, knees l-spine, likely limited by pain, no bony tenderness Neuro: Alert and oriented, speech normal      Chemistry      Component Value Date/Time   NA 142 06/16/2015 1006   K 4.5 06/16/2015 1006   CL 111* 06/16/2015 1006   CO2 22 06/16/2015 1006   BUN 21 06/16/2015 1006   CREATININE 1.92* 06/16/2015 1006   CREATININE 1.75* 04/10/2015 0346      Component Value Date/Time   CALCIUM 9.1 06/16/2015 1006   ALKPHOS 62 03/17/2015 1953   AST 39 03/17/2015 1953   ALT 41 03/17/2015 1953   BILITOT 2.4* 03/17/2015 1953      Lab Results  Component Value Date   WBC 10.2 04/10/2015   HGB 10.6* 04/10/2015   HCT 33.0* 04/10/2015   MCV 79.1 04/10/2015   PLT 182 04/10/2015   Lab Results  Component Value Date   TSH 0.401 03/18/2015   Lab Results  Component Value Date   HGBA1C 5.6 03/18/2015   Assessment:     Paul [name] is a 54 y.o. male here for f/u of hip pain    Plan:     See  problem list for problem-specific plans.   Frazier Richards, MD PGY-3,  [facility] 06/20/2015  3:11 PM

## 2015-06-20 NOTE — Telephone Encounter (Signed)
Please let patient know that his insurance will not cover the viagra but the script is at the pharmacy if he would like to pay out of pocket. Thanks!

## 2015-06-26 NOTE — Telephone Encounter (Signed)
Left message for patient to return call.

## 2015-06-30 NOTE — Telephone Encounter (Signed)
Left another message for patient to return call.

## 2015-08-05 ENCOUNTER — Ambulatory Visit: Payer: Medicare HMO | Admitting: Interventional Cardiology

## 2015-08-28 ENCOUNTER — Ambulatory Visit: Payer: Medicare HMO | Admitting: Interventional Cardiology

## 2015-09-11 ENCOUNTER — Other Ambulatory Visit: Payer: Self-pay | Admitting: Family Medicine

## 2015-09-11 NOTE — Telephone Encounter (Signed)
Pt called and needs a refill on his Prednisone and Oxycodone. Please let patient so that he can come and pick up the prescription. jw

## 2015-09-12 ENCOUNTER — Other Ambulatory Visit: Payer: Self-pay | Admitting: Family Medicine

## 2015-09-12 DIAGNOSIS — M25551 Pain in right hip: Secondary | ICD-10-CM

## 2015-09-12 DIAGNOSIS — M25552 Pain in left hip: Principal | ICD-10-CM

## 2015-09-12 MED ORDER — OXYCODONE-ACETAMINOPHEN 7.5-325 MG PO TABS: 1.0000 | ORAL_TABLET | Freq: Four times a day (QID) | ORAL | Status: DC | PRN

## 2015-09-12 MED ORDER — PREDNISONE 50 MG PO TABS: 50.0000 mg | ORAL_TABLET | ORAL | Status: DC | PRN

## 2015-09-12 NOTE — Telephone Encounter (Signed)
Patient informed. 

## 2015-09-12 NOTE — Telephone Encounter (Signed)
Pt called to check the status of his refill request. Blima Rich

## 2015-09-12 NOTE — Telephone Encounter (Signed)
Refills at front desk for patient pick-up

## 2015-11-11 ENCOUNTER — Other Ambulatory Visit: Payer: Self-pay | Admitting: Family Medicine

## 2015-11-11 NOTE — Telephone Encounter (Signed)
Pt called and needs a refill on his Prednisone and Percocet. jw

## 2015-11-11 NOTE — Telephone Encounter (Signed)
He needs a visit for those meds

## 2015-11-13 NOTE — Telephone Encounter (Signed)
Pt informed

## 2015-11-13 NOTE — Telephone Encounter (Signed)
Patient has appointment scheduled for 1/11 with PCP.

## 2015-11-19 ENCOUNTER — Ambulatory Visit (INDEPENDENT_AMBULATORY_CARE_PROVIDER_SITE_OTHER): Payer: Medicare HMO | Admitting: Family Medicine

## 2015-11-19 ENCOUNTER — Encounter: Payer: Self-pay | Admitting: Family Medicine

## 2015-11-19 VITALS — BP 174/91 | HR 61 | Temp 98.0°F | Wt 183.0 lb

## 2015-11-19 DIAGNOSIS — M25551 Pain in right hip: Secondary | ICD-10-CM | POA: Diagnosis not present

## 2015-11-19 DIAGNOSIS — N529 Male erectile dysfunction, unspecified: Secondary | ICD-10-CM | POA: Diagnosis not present

## 2015-11-19 DIAGNOSIS — F528 Other sexual dysfunction not due to a substance or known physiological condition: Secondary | ICD-10-CM | POA: Diagnosis not present

## 2015-11-19 DIAGNOSIS — M25552 Pain in left hip: Secondary | ICD-10-CM | POA: Diagnosis not present

## 2015-11-19 MED ORDER — OXYCODONE-ACETAMINOPHEN 7.5-325 MG PO TABS: 1.0000 | ORAL_TABLET | Freq: Four times a day (QID) | ORAL | Status: DC | PRN

## 2015-11-19 MED ORDER — PREDNISONE 50 MG PO TABS: 50.0000 mg | ORAL_TABLET | ORAL | Status: DC | PRN

## 2015-11-20 MED ORDER — SILDENAFIL CITRATE 100 MG PO TABS: 100.0000 mg | ORAL_TABLET | Freq: Every day | ORAL | Status: DC | PRN

## 2015-11-21 NOTE — Assessment & Plan Note (Addendum)
Difficulty getting and mainaining erections, causing him anxiety/distress, has talked to [employer] about patient assistance for viagra - forms completed and faxed to [employer]

## 2015-11-21 NOTE — Assessment & Plan Note (Signed)
Hip and knee pain, followed by [name] but doesn't want to operate on R due to complexities of old SCFE repair, pt still reports best pain relief from prednisone - continue prednisone prn (cautioned about using very sparingly due to significant side effects) - refilled percocet - [facility], will talk to [place] about this

## 2015-11-21 NOTE — Progress Notes (Signed)
   Subjective:   Paul [name] is a 55 y.o. 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  Patient reports no significant change in his bilateral hip pain. The right has been giving him more trouble lately and needs to be replaced but his orthopedists is wary due to his prior surgeries. His left knee has also been causing him some pain and swelling recently and he has an appointment with his orthopedist to discuss this. The percocet helps some to control the pain. The prednisone is most effective but he has been using it only occasionally because of my prior warnings.   Review of Systems:  Per HPI. All other systems reviewed and are negative.   PMH, PSH, Medications, Allergies, and FmHx reviewed and updated in EMR.  Social History: current smoker  Objective:  BP 174/91 mmHg  Pulse 61  Temp(Src) 98 F (36.7 C) (Oral)  Wt 183 lb (83.008 kg)  Gen:  55 y.o. male in NAD HEENT: NCAT, MMM, EOMI, PERRL, anicteric sclerae CV: RRR, no MRG, no JVD Resp: Non-labored, CTAB, no wheezes noted Abd: Soft, NTND, BS present, no guarding or organomegaly Ext: WWP, no edema MSK: strength intact, limited ROM at hips, knees, l-spine, likely limited by pain, no bony tenderness, edema of left knee, otherwise normal knee exam Neuro: Alert and oriented, speech normal      Chemistry      Component Value Date/Time   NA 142 06/16/2015 1006   K 4.5 06/16/2015 1006   CL 111* 06/16/2015 1006   CO2 22 06/16/2015 1006   BUN 21 06/16/2015 1006   CREATININE 1.92* 06/16/2015 1006   CREATININE 1.75* 04/10/2015 0346      Component Value Date/Time   CALCIUM 9.1 06/16/2015 1006   ALKPHOS 62 03/17/2015 1953   AST 39 03/17/2015 1953   ALT 41 03/17/2015 1953   BILITOT 2.4* 03/17/2015 1953      Lab Results  Component Value Date   WBC 10.2 04/10/2015   HGB 10.6* 04/10/2015   HCT 33.0* 04/10/2015   MCV 79.1 04/10/2015   PLT 182 04/10/2015   Lab  Results  Component Value Date   TSH 0.401 03/18/2015   Lab Results  Component Value Date   HGBA1C 5.6 03/18/2015   Assessment:     Paul [name] is a 55 y.o. male here for f/u of hip pain    Plan:     Bilateral hip pain Hip and knee pain, followed by Paul [name] but doesn't want to operate on R due to complexities of old SCFE repair, pt still reports best pain relief from prednisone - continue prednisone prn (cautioned about using very sparingly due to significant side effects) - refilled percocet - [facility] is not comfortable, will talk to [facility] about this  ERECTILE DYSFUNCTION Difficulty getting and mainaining erections, causing him anxiety/distress, has talked to [employer] about patient assistance for viagra - forms completed and faxed to [employer], MD PGY-3,  [facility] 11/21/2015  10:02 AM

## 2015-12-02 ENCOUNTER — Telehealth: Payer: Self-pay | Admitting: *Deleted

## 2015-12-02 NOTE — Telephone Encounter (Signed)
Called patient to offer flu vaccine, however, VM picked up.  Left message on patient's voice mail to return call. Velora Heckler, RN

## 2015-12-15 ENCOUNTER — Other Ambulatory Visit: Payer: Self-pay | Admitting: Family Medicine

## 2015-12-15 DIAGNOSIS — J309 Allergic rhinitis, unspecified: Secondary | ICD-10-CM

## 2015-12-30 ENCOUNTER — Telehealth: Payer: Self-pay | Admitting: *Deleted

## 2015-12-30 NOTE — Telephone Encounter (Signed)
Left message informing that medication was up front for pick up.

## 2015-12-30 NOTE — Telephone Encounter (Signed)
-----   Message from Frazier Richards, MD sent at 12/30/2015  1:33 PM EST ----- Regarding: Medication Please inform patient that his medication has been delivered and is available for him to pick up at the front desk. Thank you!

## 2016-01-06 NOTE — Telephone Encounter (Signed)
Called patient to offer flu vaccine. Left message with man who answered to have patient return call to schedule flu shot. May also receive as walk-in. Velora Heckler, RN

## 2016-01-21 ENCOUNTER — Telehealth: Payer: Self-pay | Admitting: Family Medicine

## 2016-01-21 NOTE — Telephone Encounter (Signed)
Pt needs refills on percocet and prednisone. Pt would like to get a flu shot when he picks up the prescription Please call pt when ready for pickup

## 2016-01-23 NOTE — Telephone Encounter (Signed)
Pt called to check the status of his refill request from Monday. He is asking for Percocet and Prednisone. Please inform patient when ready. jw

## 2016-01-26 NOTE — Telephone Encounter (Signed)
This is not an appropriate request. He should make an appointment to be seen if his pain is worse.

## 2016-01-26 NOTE — Telephone Encounter (Signed)
Pt calling to check status again. Fleeger, Salome Spotted, CMA

## 2016-01-28 NOTE — Telephone Encounter (Signed)
Patient has appt on 01-30-16. Eleena Grater,CMA

## 2016-01-30 ENCOUNTER — Encounter: Payer: Self-pay | Admitting: Family Medicine

## 2016-01-30 ENCOUNTER — Ambulatory Visit (INDEPENDENT_AMBULATORY_CARE_PROVIDER_SITE_OTHER): Payer: Medicare HMO | Admitting: Family Medicine

## 2016-01-30 VITALS — BP 113/65 | HR 62 | Temp 98.1°F | Ht 72.0 in | Wt 177.0 lb

## 2016-01-30 DIAGNOSIS — M25551 Pain in right hip: Secondary | ICD-10-CM

## 2016-01-30 DIAGNOSIS — Z23 Encounter for immunization: Secondary | ICD-10-CM | POA: Diagnosis not present

## 2016-01-30 DIAGNOSIS — M25552 Pain in left hip: Secondary | ICD-10-CM

## 2016-01-30 MED ORDER — OXYCODONE-ACETAMINOPHEN 7.5-325 MG PO TABS: 1.0000 | ORAL_TABLET | Freq: Four times a day (QID) | ORAL | Status: DC | PRN

## 2016-01-30 MED ORDER — PREDNISONE 50 MG PO TABS: 50.0000 mg | ORAL_TABLET | ORAL | Status: DC | PRN

## 2016-02-01 NOTE — Assessment & Plan Note (Signed)
Hip and knee pain, followed by Paul [name] but doesn't want to operate on R due to complexities of old SCFE repair, waiting for return call from [facility] referral, pt still reports best pain relief from prednisone - continue prednisone prn (cautioned about using very sparingly due to significant side effects) - refilled percocet

## 2016-02-01 NOTE — Progress Notes (Signed)
   Subjective:   Paul [name] is a 55 y.o. 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  Patient reports no significant change in his bilateral hip pain. The right has been giving him more trouble lately and needs to be replaced but his orthopedists is wary due to his prior surgeries so has referred him to [facility] hip specialist at [facility]. The percocet helps some to control the pain. The prednisone is most effective but he has been using it only occasionally because of my prior warnings.   Review of Systems:  Per HPI. All other systems reviewed and are negative.   PMH, PSH, Medications, Allergies, and FmHx reviewed and updated in EMR.  Social History: current smoker  Objective:  BP 113/65 mmHg  Pulse 62  Temp(Src) 98.1 F (36.7 C) (Oral)  Ht 6' (1.829 m)  Wt 177 lb (80.287 kg)  BMI 24.00 kg/m2  Gen:  55 y.o. male in NAD HEENT: NCAT, MMM, EOMI, PERRL, anicteric sclerae CV: RRR, no MRG, no JVD Resp: Non-labored, CTAB, no wheezes noted Abd: Soft, NTND, BS present, no guarding or organomegaly Ext: WWP, no edema MSK: strength intact, limited ROM at hips, knees, l-spine, likely limited by pain, no bony tenderness, edema of left knee, otherwise normal knee exam Neuro: Alert and oriented, speech normal      Chemistry      Component Value Date/Time   NA 142 06/16/2015 1006   K 4.5 06/16/2015 1006   CL 111* 06/16/2015 1006   CO2 22 06/16/2015 1006   BUN 21 06/16/2015 1006   CREATININE 1.92* 06/16/2015 1006   CREATININE 1.75* 04/10/2015 0346      Component Value Date/Time   CALCIUM 9.1 06/16/2015 1006   ALKPHOS 62 03/17/2015 1953   AST 39 03/17/2015 1953   ALT 41 03/17/2015 1953   BILITOT 2.4* 03/17/2015 1953      Lab Results  Component Value Date   WBC 10.2 04/10/2015   HGB 10.6* 04/10/2015   HCT 33.0* 04/10/2015   MCV 79.1 04/10/2015   PLT 182 04/10/2015   Lab Results  Component Value Date   TSH 0.401  03/18/2015   Lab Results  Component Value Date   HGBA1C 5.6 03/18/2015   Assessment:     Paul [name] is a 55 y.o. male here for f/u of hip pain    Plan:     Bilateral hip pain Hip and knee pain, followed by [name] but doesn't want to operate on R due to complexities of old SCFE repair, waiting for return call from [facility] referral, pt still reports best pain relief from prednisone - continue prednisone prn (cautioned about using very sparingly due to significant side effects) - refilled percocet    Frazier Richards, MD PGY-3,  [facility] 02/01/2016  9:24 PM

## 2016-03-12 ENCOUNTER — Other Ambulatory Visit: Payer: Self-pay | Admitting: Family Medicine

## 2016-03-12 DIAGNOSIS — N529 Male erectile dysfunction, unspecified: Secondary | ICD-10-CM

## 2016-03-12 NOTE — Telephone Encounter (Signed)
[employer] phone # [phone]. Pt's ID # [id number]

## 2016-03-12 NOTE — Telephone Encounter (Signed)
Pt states that RX for Viagra was to be sent in when he was see back on 01/30/16. Does not look like this has been done. Pt will call back with the fax # to the company it will be going to.

## 2016-03-23 NOTE — Telephone Encounter (Signed)
Pt is calling to check the status of his Viagra. He gave [place] the phone number and his ID but not sure if we called this or not. Please update the patient on the medication jw

## 2016-03-30 NOTE — Telephone Encounter (Signed)
Pt calls again and has not heard anything, he does "not know what is going on, it never takes this long".  States that we are to call the number provided below and the medication is sent here and he picks it up.   Will forward to Dr. Ree Kida (preceptor today). Fleeger, Salome Spotted, CMA

## 2016-03-31 NOTE — Telephone Encounter (Signed)
Refilled with [employer] and contacted patient to let him know and apologize for the delay. Being shipped now.

## 2016-04-01 ENCOUNTER — Other Ambulatory Visit: Payer: Self-pay | Admitting: Family Medicine

## 2016-04-01 DIAGNOSIS — M25551 Pain in right hip: Secondary | ICD-10-CM

## 2016-04-01 DIAGNOSIS — M25552 Pain in left hip: Principal | ICD-10-CM

## 2016-04-01 NOTE — Telephone Encounter (Signed)
Pt called and needs a refill on his Percocet left up front for pick up and also a refill  On Prednisone called in to the pharmacy. Please let patient know when ready to pick up. jw

## 2016-04-07 MED ORDER — OXYCODONE-ACETAMINOPHEN 7.5-325 MG PO TABS: 1.0000 | ORAL_TABLET | Freq: Four times a day (QID) | ORAL | Status: DC | PRN

## 2016-04-07 MED ORDER — PREDNISONE 50 MG PO TABS: 50.0000 mg | ORAL_TABLET | ORAL | Status: DC | PRN

## 2016-04-07 NOTE — Telephone Encounter (Signed)
Pt informed. Deseree Blount, CMA  

## 2016-04-07 NOTE — Telephone Encounter (Signed)
Scripts for these as well as his viagra are at the front desk for pick-up. Please let him know. Thanks!

## 2016-04-07 NOTE — Telephone Encounter (Signed)
Pt is calling to check the status of his request for a refill on his percocet and prednisone. Please let patient know the status of this. jw

## 2016-04-08 ENCOUNTER — Telehealth: Payer: Self-pay | Admitting: Family Medicine

## 2016-04-08 NOTE — Telephone Encounter (Signed)
Letter is up front for pickup along with his medication and prescriptions. Thanks!

## 2016-04-08 NOTE — Telephone Encounter (Signed)
Patient informed. 

## 2016-04-08 NOTE — Telephone Encounter (Signed)
Pt needs a letter from dr stating he is permanently disabled. He would like to come pick this up. Please let him know when it is ready for pickup

## 2016-05-26 ENCOUNTER — Other Ambulatory Visit: Payer: Self-pay | Admitting: Internal Medicine

## 2016-05-26 DIAGNOSIS — M25551 Pain in right hip: Secondary | ICD-10-CM

## 2016-05-26 DIAGNOSIS — M25552 Pain in left hip: Principal | ICD-10-CM

## 2016-05-26 NOTE — Telephone Encounter (Signed)
Pt is calling for a refill on his medication oxycodone and prednisone. Call pt when ready to pick up. ep

## 2016-05-26 NOTE — Telephone Encounter (Signed)
Pt is calling for a refill on his medication oxycodone and prednisone. Call pt when ready to pick up. Ep

## 2016-05-28 ENCOUNTER — Other Ambulatory Visit: Payer: Self-pay | Admitting: Internal Medicine

## 2016-05-28 NOTE — Telephone Encounter (Signed)
Pt informed that he needs an appt. appt scheduled. Emme Rosenau Kennon Holter, CMA

## 2016-05-28 NOTE — Telephone Encounter (Signed)
Pt states 2 rx were supposed to be ready for pick up, prednisone and percocet. I checked the box up front twice and didn't see anything. Pt would like someone to call him when they are ready for pick up. ep

## 2016-05-28 NOTE — Telephone Encounter (Signed)
Pt made an appt. Kyley Laurel Kennon Holter, CMA

## 2016-05-31 ENCOUNTER — Telehealth: Payer: Self-pay | Admitting: *Deleted

## 2016-05-31 ENCOUNTER — Ambulatory Visit (INDEPENDENT_AMBULATORY_CARE_PROVIDER_SITE_OTHER): Payer: Medicare HMO | Admitting: Family Medicine

## 2016-05-31 VITALS — BP 139/94 | HR 62 | Temp 98.7°F | Ht 71.5 in | Wt 188.0 lb

## 2016-05-31 DIAGNOSIS — M25552 Pain in left hip: Secondary | ICD-10-CM

## 2016-05-31 DIAGNOSIS — G894 Chronic pain syndrome: Secondary | ICD-10-CM | POA: Diagnosis not present

## 2016-05-31 DIAGNOSIS — M170 Bilateral primary osteoarthritis of knee: Secondary | ICD-10-CM | POA: Diagnosis not present

## 2016-05-31 DIAGNOSIS — M25551 Pain in right hip: Secondary | ICD-10-CM

## 2016-05-31 DIAGNOSIS — M179 Osteoarthritis of knee, unspecified: Secondary | ICD-10-CM | POA: Insufficient documentation

## 2016-05-31 DIAGNOSIS — Z9889 Other specified postprocedural states: Secondary | ICD-10-CM | POA: Diagnosis not present

## 2016-05-31 DIAGNOSIS — M1710 Unilateral primary osteoarthritis, unspecified knee: Secondary | ICD-10-CM | POA: Insufficient documentation

## 2016-05-31 HISTORY — DX: Chronic pain syndrome: G89.4

## 2016-05-31 MED ORDER — OXYCODONE-ACETAMINOPHEN 7.5-325 MG PO TABS: 1.0000 | ORAL_TABLET | Freq: Four times a day (QID) | ORAL | 0 refills | Status: DC | PRN

## 2016-05-31 NOTE — Assessment & Plan Note (Signed)
Refilled Percocet Patient signed pain contract Advised patient extensively that as needed prednisone is not appropriate and will not help in his replaced joints As patient was not taking prednisone regularly, is difficult to put him on a taper Precepted with Dr. Nori Riis

## 2016-05-31 NOTE — Patient Instructions (Addendum)
Nice to meet you today. We will prescribe your percocet and have you sign a pain contract today. We gave you injections into your knee joints today. This can be done about every 3 or 4 months. We will stop the prednisone today.  Come back to see her primary care doctor  Take care, Dr. Jacinto Reap

## 2016-05-31 NOTE — Telephone Encounter (Signed)
Patient needs refill on viagra.Paul [name], Paul [name]

## 2016-05-31 NOTE — Assessment & Plan Note (Signed)
Corticosteroid injections performed Patient tolerated well Cannot find x-rays on file Consider at next appointment

## 2016-05-31 NOTE — Progress Notes (Signed)
   Subjective:   Paul [name] is a 55 y.o. male with a history of PAF, NSTEMI, Hypertensive cardiomyopathy, HTN, CKD3, HLD here for Hip and knee pain  B/l hip pain - S/p L hip replacement 1.5 yrs ago - Has had multiple hip surgeries since childhood - trying to exercise more - prednisone and percocet help with ability to complete ADLs  - takes prednisone $RemoveBeforeDE'50mg'lIMaEhzNwTTHMFo$  daily as needed with no regular schedule - thinks he takes it 2-3 times weekly - thinks he takes percocet at least daily  B/l knee pain - reports that knees are "bone on bone" - has discussed viscous injections previously with Ortho - not following with Ortho for knees currently however - thinks he has had corticosteroid injections previously  Review of Systems:  Per HPI.   Social History: Current smoker  Objective:  BP (!) 139/94 (BP Location: Left Arm, Patient Position: Standing, Cuff Size: Normal)   Pulse 62   Temp 98.7 F (37.1 C) (Oral)   Ht 5' 11.5" (1.816 m)   Wt 188 lb (85.3 kg)   SpO2 99%   BMI 25.86 kg/m   Gen:  55 y.o. male in NAD HEENT: NCAT, MMM, EOMI, PERRL, anicteric sclerae CV: RRR, no MRG, no JVD Resp: Non-labored, CTAB, no wheezes noted Ext: WWP, no edema MSK: strength intact, limited ROM at hips, knees, l-spine, likely limited by pain, no bony tenderness, no edema or effusions, otherwise normal knee exam Neuro: Alert and oriented, speech normal    Assessment & Plan:     Paul [name] is a 55 y.o. male here for   Osteoarthritis, knee Corticosteroid injections performed Patient tolerated well Cannot find x-rays on file Consider at next appointment  Bilateral hip pain Refilled Percocet Patient signed pain contract Advised patient extensively that as needed prednisone is not appropriate and will not help in his replaced joints As patient was not taking prednisone regularly, is difficult to put him on a taper Precepted with Dr. Nori Riis    Procedure: Bilateral knee steroid  injections Consent signed and scanned into record. Medication:  1 cc Solumedrol  4 cc Lidocaine !% without epi Preparation: area cleansed with alcohol Time Out taken  Injection  Landmarks identified 5 cc of medication injected into joint space using a anterior-medial approach Patient tolerated well without bleeding or paresthesias  Patient had good range of motion of joint after injection    Virginia Crews, MD MPH PGY-3,  [facility] 05/31/2016  5:15 PM

## 2016-06-01 ENCOUNTER — Other Ambulatory Visit: Payer: Self-pay | Admitting: Internal Medicine

## 2016-06-01 DIAGNOSIS — N529 Male erectile dysfunction, unspecified: Secondary | ICD-10-CM

## 2016-06-01 MED ORDER — SILDENAFIL CITRATE 100 MG PO TABS: 100.0000 mg | ORAL_TABLET | Freq: Every day | ORAL | 3 refills | Status: DC | PRN

## 2016-06-01 NOTE — Telephone Encounter (Signed)
Refilled prescription for viagra as requested.

## 2016-06-01 NOTE — Telephone Encounter (Signed)
Prior Authorization received from [employer] for Viagra.PA completed online at www.covermymeds.com.  PA pending, review process could take 24-72 hours to complete.   Derl Barrow, RN

## 2016-06-01 NOTE — Telephone Encounter (Signed)
PA for Viagra via [place].  Medication is excluded from Medicare Part D coverage for treatment and maintenance of erectile dysfunction.  Derl Barrow, RN

## 2016-06-02 MED ORDER — METHYLPREDNISOLONE ACETATE 40 MG/ML IJ SUSP
40.0000 mg | Freq: Once | INTRAMUSCULAR | Status: AC
  Administered 2016-05-31: 40 mg via INTRA_ARTICULAR

## 2016-06-02 NOTE — Telephone Encounter (Signed)
Patient states that he does not get this medication from [facility] he gets is directly from the company. He states MD needs to call [facility] at [phone] and reference [id number]. He states that this is they way Dr. Sherril Cong was getting it for him in the past and the company will mail the medication here and he comes and picks it up.

## 2016-06-02 NOTE — Addendum Note (Signed)
Addended by: Leonia Corona R on: 06/02/2016 04:57 PM   Modules accepted: Orders

## 2016-06-03 NOTE — Telephone Encounter (Signed)
[place] patient assistance line and requested form to provide medication. Received fax and faxed in prescription 06/02/16.

## 2016-06-08 ENCOUNTER — Telehealth: Payer: Self-pay | Admitting: Internal Medicine

## 2016-06-29 ENCOUNTER — Other Ambulatory Visit: Payer: Self-pay | Admitting: Internal Medicine

## 2016-06-29 NOTE — Telephone Encounter (Signed)
Viagra prescription arrived to clinic. Left for patient behind front desk. Informed patient that it had arrived.

## 2016-07-06 ENCOUNTER — Telehealth: Payer: Self-pay | Admitting: Internal Medicine

## 2016-07-06 NOTE — Telephone Encounter (Signed)
Needs refill on percocet.

## 2016-07-07 NOTE — Telephone Encounter (Signed)
I am not in the office today. I can provide Rx tomorrow. If he needs sooner, please ask him to make an appointment.

## 2016-07-08 ENCOUNTER — Other Ambulatory Visit: Payer: Self-pay | Admitting: Internal Medicine

## 2016-07-08 DIAGNOSIS — M25551 Pain in right hip: Secondary | ICD-10-CM

## 2016-07-08 DIAGNOSIS — M25552 Pain in left hip: Principal | ICD-10-CM

## 2016-07-08 MED ORDER — OXYCODONE-ACETAMINOPHEN 7.5-325 MG PO TABS: 1.0000 | ORAL_TABLET | Freq: Four times a day (QID) | ORAL | 0 refills | Status: DC | PRN

## 2016-07-08 NOTE — Telephone Encounter (Signed)
Left prescription for percocet up front for patient. Called to let him know if was ready and that he would need to schedule an appointment before more refills can be given. He said he would call for an appointment within the next month.

## 2016-07-15 ENCOUNTER — Telehealth: Payer: Self-pay | Admitting: Internal Medicine

## 2016-07-15 NOTE — Telephone Encounter (Signed)
DMV form dropped off for at front desk for completion.  Verified that patient section of form has been completed.  Last DOS/WCC with PCP was 05-31-2016.  Placed form in team folder to be completed by clinical staff.  Rosa A Charlies Constable

## 2016-07-20 NOTE — Telephone Encounter (Signed)
Form placed in PCP box 

## 2016-07-20 NOTE — Telephone Encounter (Signed)
Completed and placed in nursing box.

## 2016-07-21 NOTE — Telephone Encounter (Signed)
Left voice message for patient that form is complete and ready for pick up.  Derl Barrow, RN

## 2016-08-24 ENCOUNTER — Other Ambulatory Visit: Payer: Self-pay | Admitting: Internal Medicine

## 2016-08-24 DIAGNOSIS — M25551 Pain in right hip: Secondary | ICD-10-CM

## 2016-08-24 DIAGNOSIS — M25552 Pain in left hip: Secondary | ICD-10-CM

## 2016-08-24 DIAGNOSIS — N529 Male erectile dysfunction, unspecified: Secondary | ICD-10-CM

## 2016-08-24 NOTE — Telephone Encounter (Signed)
Please see telephone note from 05/31/16 regarding Viagra.  Derl Barrow, RN

## 2016-08-24 NOTE — Telephone Encounter (Signed)
Pt needs a refill on percocet and Viagra. For the Viagra the pt said it is called into the manufacture and that the nurse knew what to do. Please advise. Thanks! ep

## 2016-08-25 ENCOUNTER — Other Ambulatory Visit: Payer: Self-pay | Admitting: Internal Medicine

## 2016-08-25 DIAGNOSIS — M25552 Pain in left hip: Principal | ICD-10-CM

## 2016-08-25 DIAGNOSIS — M25551 Pain in right hip: Secondary | ICD-10-CM

## 2016-08-25 MED ORDER — OXYCODONE-ACETAMINOPHEN 7.5-325 MG PO TABS: 1.0000 | ORAL_TABLET | Freq: Four times a day (QID) | ORAL | 0 refills | Status: DC | PRN

## 2016-08-25 NOTE — Telephone Encounter (Signed)
Left message for patient that percocet refill is ready for him in an envelope at the front desk. Will not provide any further refills without an office visit. Have been in contact with [employer] medication assistance program regarding viagra refills. There are issues with me prescribing with a resident DEA number. Was told to fill out an application online at [email or web address] but could not proceed because [facility] address was not recognized. Will continue to troubleshoot to obtain refill.   Olene Floss, MD [place], PGY-2

## 2016-08-27 ENCOUNTER — Telehealth: Payer: Self-pay | Admitting: Internal Medicine

## 2016-08-27 NOTE — Telephone Encounter (Signed)
Patient came by office request Dr Ola Spurr call [facility] [phone] and give them the ID # [id number] for the prescription Viagra.  Patient stated he may be reached at [phone] if any questions.

## 2016-09-01 NOTE — Telephone Encounter (Signed)
Have been trying to fill medication over the phone last week but have encountered issues because I have a resident group [place] number. Was then told to register through their website in order to fill prescription through [email or web address] but could not proceed because website does not recognize [facility] address. Sent email to [employer] patient assistance IT department for error in registration process to order this patient's refill. They said to expect an email back with assistance in about 48 hours. Will continue to try to fulfill this request as able.   Olene Floss, MD  [place], PGY-2

## 2016-09-10 ENCOUNTER — Encounter: Payer: Self-pay | Admitting: Internal Medicine

## 2016-09-13 ENCOUNTER — Telehealth: Payer: Self-pay | Admitting: Internal Medicine

## 2016-09-13 NOTE — Telephone Encounter (Signed)
Left patient a message. Let him know handicap placard has been at front desk. If there is another form, asked him to call back to clarify. Informed him that [facility] will not accept my refill request due to resident training DEA and ongoing IT issue to register with their program. Will leave message with Dr. Valentina Lucks about other means of obtaining this medication. Patient has appointment next week. Need to determine long-term pain medication plan. At very least need to begin tapering opioids.

## 2016-09-13 NOTE — Telephone Encounter (Signed)
Pt called and was checking the status of his request  For medications and other issues. He was talking about forms, letters and medication and not in that order. I have made him an appointment for next week. Please call to discuss the rest. jw

## 2016-09-20 ENCOUNTER — Ambulatory Visit: Payer: Medicare HMO | Admitting: Internal Medicine

## 2016-09-20 NOTE — Telephone Encounter (Signed)
Pt is calling because he said that [place] only needs a faculty doctor to sign the forms and request the medication and he will be able to get this. jw

## 2016-09-20 NOTE — Telephone Encounter (Signed)
Called patient but reached voicemail and left message. He had an appointment for today, 11/13, but canceled. In reviewing his record in preparation for his appointment, several concerns arose. Per our records, he has not followed up with Cardiology after having a heart attack last year. Stated that I would not refill viagra without a clinic appointment and likely would recommend he see a Cardiologist first. Michela Pitcher I had concerns about his pain medication needs and again would like to meet in person to discuss how he has been doing.   Will not be refilling any further pain medication requests at this time, as review of [place] database on 11/12 did not show last Rx filled. If I am not the next [facility] provider to see him, ask that oxycodone is not filled unless this red flag is addressed. If there is a reasonable explanation for this discrepancy, would recommend tapering.   If patient calls back about voice message left, please stress need to have an appointment. I had already sent him a letter about this.

## 2016-09-28 ENCOUNTER — Encounter: Payer: Self-pay | Admitting: Internal Medicine

## 2016-09-28 ENCOUNTER — Ambulatory Visit (INDEPENDENT_AMBULATORY_CARE_PROVIDER_SITE_OTHER): Payer: Medicare HMO | Admitting: Internal Medicine

## 2016-09-28 VITALS — BP 149/89 | HR 70 | Temp 98.3°F | Ht 71.5 in | Wt 186.6 lb

## 2016-09-28 DIAGNOSIS — Z23 Encounter for immunization: Secondary | ICD-10-CM

## 2016-09-28 DIAGNOSIS — I2510 Atherosclerotic heart disease of native coronary artery without angina pectoris: Secondary | ICD-10-CM

## 2016-09-28 DIAGNOSIS — M25551 Pain in right hip: Secondary | ICD-10-CM | POA: Diagnosis not present

## 2016-09-28 DIAGNOSIS — M25552 Pain in left hip: Secondary | ICD-10-CM

## 2016-09-28 DIAGNOSIS — F528 Other sexual dysfunction not due to a substance or known physiological condition: Secondary | ICD-10-CM

## 2016-09-28 DIAGNOSIS — N183 Chronic kidney disease, stage 3 unspecified: Secondary | ICD-10-CM

## 2016-09-28 DIAGNOSIS — I10 Essential (primary) hypertension: Secondary | ICD-10-CM

## 2016-09-28 DIAGNOSIS — D649 Anemia, unspecified: Secondary | ICD-10-CM

## 2016-09-28 DIAGNOSIS — Z9861 Coronary angioplasty status: Secondary | ICD-10-CM

## 2016-09-28 DIAGNOSIS — G894 Chronic pain syndrome: Secondary | ICD-10-CM

## 2016-09-28 LAB — CBC
HEMATOCRIT: 38.1 % — AB (ref 38.5–50.0)
Hemoglobin: 11.6 g/dL — ABNORMAL LOW (ref 13.2–17.1)
MCH: 24.2 pg — AB (ref 27.0–33.0)
MCHC: 30.4 g/dL — ABNORMAL LOW (ref 32.0–36.0)
MCV: 79.4 fL — AB (ref 80.0–100.0)
MPV: 10.3 fL (ref 7.5–12.5)
Platelets: 196 10*3/uL (ref 140–400)
RBC: 4.8 MIL/uL (ref 4.20–5.80)
RDW: 14.5 % (ref 11.0–15.0)
WBC: 7.2 10*3/uL (ref 3.8–10.8)

## 2016-09-28 LAB — COMPLETE METABOLIC PANEL WITH GFR
ALBUMIN: 4.1 g/dL (ref 3.6–5.1)
ALK PHOS: 66 U/L (ref 40–115)
ALT: 13 U/L (ref 9–46)
AST: 20 U/L (ref 10–35)
BILIRUBIN TOTAL: 1.1 mg/dL (ref 0.2–1.2)
BUN: 24 mg/dL (ref 7–25)
CALCIUM: 8.7 mg/dL (ref 8.6–10.3)
CO2: 25 mmol/L (ref 20–31)
CREATININE: 2 mg/dL — AB (ref 0.70–1.33)
Chloride: 111 mmol/L — ABNORMAL HIGH (ref 98–110)
GFR, EST AFRICAN AMERICAN: 42 mL/min — AB (ref >=60)
GFR, EST NON AFRICAN AMERICAN: 36 mL/min — AB (ref >=60)
Glucose, Bld: 92 mg/dL (ref 65–99)
Potassium: 4.2 mmol/L (ref 3.5–5.3)
Sodium: 145 mmol/L (ref 135–146)
TOTAL PROTEIN: 6.6 g/dL (ref 6.1–8.1)

## 2016-09-28 LAB — LIPID PANEL
CHOLESTEROL: 162 mg/dL (ref <200)
HDL: 56 mg/dL (ref >40)
LDL CALC: 85 mg/dL (ref <100)
TRIGLYCERIDES: 106 mg/dL (ref <150)
Total CHOL/HDL Ratio: 2.9 Ratio (ref <5.0)
VLDL: 21 mg/dL (ref <30)

## 2016-09-28 MED ORDER — PREDNISONE 20 MG PO TABS: 20.0000 mg | ORAL_TABLET | Freq: Every day | ORAL | 0 refills | Status: DC

## 2016-09-28 MED ORDER — OXYCODONE-ACETAMINOPHEN 7.5-325 MG PO TABS: 1.0000 | ORAL_TABLET | Freq: Four times a day (QID) | ORAL | 0 refills | Status: DC | PRN

## 2016-09-28 NOTE — Patient Instructions (Signed)
Hi Mr. Sigal,  It was nice to finally meet you today!  I will call you with your lab results.  Please take the 5-day steroid burst when you know you will be more active.  Please make a follow-up appointment before your pain medication runs out to see how your pain is controlled.  It is very important to follow-up with your heart doctor and kidney doctors. Please make appointments with them as soon as possible.  Best, Dr. Ola Spurr

## 2016-09-28 NOTE — Progress Notes (Signed)
[name] Progress Note  Subjective:  Paul [name] is a 55 y.o. male with history of repeat hip surgeries, CKD III with renal AV malformation, HTN, hypertensive cardiomyopathy, erectile dysfunction, and NSTEMI in June 2016 with DES to LAD in May 2016 and CFX in June 2016. He is here to meet new PCP.  Chronic arthritis in hips and knees: - Follows with Dr. Paulo Fruit for orthopedic complaints - Had L hip replacement 1.5 years ago; R hip 2001, 2003 and needs another replacement planned for next year - Has tried steroid injections for presumed OA of bilateral knees without improvement - Pt says best combination for pain relief has been PO steroids and percocet - He takes percocet as needed, usually two 7.5-325 mg pills a day - Pain is worse in the morning and when active and with cold weather - Taking percocet allows him to have increased mobility and to do things like tie his shoes ROS: No recent falls  CKD: - Has not had recent labwork - Last saw Nephrologist Dr. Florene Glen last year ROS: Denies decreased urinary output  Erectile Dysfunction: - Enrolled in [place] medication assistance program that provides free viagra, enrolled though this December - Is out of viagra and would like refills before enrollment ends  CAD: - Primary Cardiologist is Dr. Irish Lack - Did not follow with him as recommended after stent placement June 2016 - On brilinta, aspirin, lipitor  ROS: Denies chest pain  HTN: - Takes metoprolol 25 mg BID - ECHO 03/2015 showed biatrial enlargement  Past Medical History:  Diagnosis Date  . Arthritis   . CAD (coronary artery disease)    a. 03/2015 NSTEMI/PCI: LM nl, LAD 95p (3.5x20 Promus DES), D1 60, D2/3 mild dzs, LCX 96m (plan for staged pci), OM1/2 min irregs, RCA 20p, RPDA 60.  Marland Kitchen CKD (chronic kidney disease), stage III   . GERD (gastroesophageal reflux disease)    occ  . HOCM (hypertrophic obstructive cardiomyopathy) ([place])    a. 03/2015 Echo: EF  65-60%, sev LV thickening w/ bright, speckled appearance of IV septum, Gr 2 DD, AoV sclerosis, triv AI, mild MR, LA 67ml/m^2, RA 54ml/m^2, aneurysmal IAS, mod TR, PASP 29mmHg.  Marland Kitchen Hypertension    denies  . PAF (paroxysmal atrial fibrillation) ([place])    remote isolated episode - 09/2012.  . TMJ arthralgia     Social History   Social History  . Marital status: Divorced    Spouse name: N/A  . Number of children: N/A  . Years of education: N/A   Occupational History  . Not on file.   Social History Main Topics  . Smoking status: Light Tobacco Smoker    Packs/day: 0.25    Years: 10.00    Types: Cigarettes  . Smokeless tobacco: Never Used  . Alcohol use 1.2 oz/week    2 Cans of beer per week     Comment: occasional  . Drug use: No  . Sexual activity: Not on file   Other Topics Concern  . Not on file   Social History Narrative  . No narrative on file    Allergies  Allergen Reactions  . Ibuprofen Other (See Comments)    Kidney damage  . Nsaids     REACTION: nsaid induced nephropathy    Objective: Blood pressure (!) 149/89, pulse 70, temperature 98.3 F (36.8 C), temperature source Oral, height 5' 11.5" (1.816 m), weight 84.6 kg (186 lb 9.6 oz). Body mass index is 25.66 kg/m. Constitutional: Well-appearing male  with slow gait due to pain Cardiovascular: RRR, S1, S2, no m/r/g.  Musculoskeletal: Bilateral crepitus of knees L > R. Limited range of motion at hips, knees, lumbar spine. More comfortable for patient to lean against exam table than sitting straight up. Strength, sensation intact.  Skin: Long scars over bilateral hips.  Psychiatric: Normal mood and affect.  Vitals reviewed  Assessment/Plan: Chronic pain syndrome - Obtain UDS at next visit - Continue to try to taper medication, offering opioid alternatives - Ordered prednisone 20 mg x 5 days as patient anticipates being more active with his family over Thanksgiving - Recommended capsaicin - Consider  re-referral to PT in 2018 if would not interfere with expected R hip arthroplasty rehab  Indication for chronic opioid: Arthritis of hips and knees, to have R hip replacement 2018 Medication and dose: Percocet 7.5-325 mg #60 (reduced from #100) x 1 month # pills per month: 60 Last UDS date: None Pain contract signed (Y/N): Y Date narcotic database last reviewed (include red flags): 11/11. Red flag of no prescriptions being shown as filled. However, called [facility] on [address] and confirmed patient has filled prescriptions on 8/31 and 10/20  Chronic renal insufficiency, stage III (moderate) - Obtain CMP, CBC - Last SCr 1.92 on 06/2015 - Stressed importance of patient calling his Nephrologist to follow-up in hopes of slowing progression of disease  ERECTILE DYSFUNCTION - BP not at goal but not as high as has been in past. In addition, Cardiology discharged patient with cialis after last hospitalization for NSTEMI - Will continue to try to order viagra through [employer] assistance program (cannot use phone ordering option due to resident DEA # and could not make web profile due to clinic address not being recognized)  CAD - S/P LAD DES May 2016, staged CFX DES June 2016 - Asked patient to make appointment with his Cardiologist, as he is overdue for follow-up - Ordered lipid panel  Essential hypertension - Not at goal of < 140/90 - Would benefit from better control. Given worsening kidney function, likely would not start lisinopril. Could consider spironolactone. Would switch to long-acting beta blocker.  - Recheck pressure at follow-up next month.  Follow-up in less than a month to see if specialty follow-ups have been scheduled and to discuss changing BP medication/modifiable factors.  Olene Floss, MD [place], PGY-2

## 2016-10-01 NOTE — Assessment & Plan Note (Signed)
-   Not at goal of < 140/90 - Would benefit from better control. Given worsening kidney function, likely would not start lisinopril. Could consider spironolactone. Would switch to long-acting beta blocker.  - Recheck pressure at follow-up next month.

## 2016-10-01 NOTE — Assessment & Plan Note (Signed)
-   BP not at goal but not as high as has been in past. In addition, Cardiology discharged patient with cialis after last hospitalization for NSTEMI - Will continue to try to order viagra through [employer] assistance program (cannot use phone ordering option due to resident DEA # and could not make web profile due to clinic address not being recognized)

## 2016-10-01 NOTE — Assessment & Plan Note (Addendum)
-   Obtain CMP, CBC - Last SCr 1.92 on 06/2015 - Stressed importance of patient calling his Nephrologist to follow-up in hopes of slowing progression of disease

## 2016-10-01 NOTE — Assessment & Plan Note (Addendum)
-   Asked patient to make appointment with his Cardiologist, as he is overdue for follow-up - Ordered lipid panel

## 2016-10-01 NOTE — Assessment & Plan Note (Addendum)
-   Obtain UDS at next visit - Continue to try to taper medication, offering opioid alternatives - Ordered prednisone 20 mg x 5 days as patient anticipates being more active with his family over Thanksgiving - Recommended capsaicin - Consider re-referral to PT in 2018 if would not interfere with expected R hip arthroplasty rehab  Indication for chronic opioid: Arthritis of hips and knees, to have R hip replacement 2018 Medication and dose: Percocet 7.5-325 mg #60 (reduced from #100) x 1 month # pills per month: 60 Last UDS date: None Pain contract signed (Y/N): Y Date narcotic database last reviewed (include red flags): 11/11. Red flag of no prescriptions being shown as filled. However, called [facility] on [address] and confirmed patient has filled prescriptions on 8/31 and 10/20

## 2016-10-07 ENCOUNTER — Other Ambulatory Visit: Payer: Self-pay | Admitting: Internal Medicine

## 2016-10-07 NOTE — Telephone Encounter (Signed)
Was able to reorder viagra for patient through [employer] with NPI number. Rx should arrive in 7-10 business days. Order # [id number].

## 2016-10-13 ENCOUNTER — Telehealth: Payer: Self-pay | Admitting: Internal Medicine

## 2016-10-13 NOTE — Telephone Encounter (Signed)
Informed patient that viagra had arrived and that I left it at the front of clinic for him to pick up. Stressed importance of following up with his Nephrologist. He said he would call to make an appointment.

## 2016-10-18 ENCOUNTER — Other Ambulatory Visit: Payer: Self-pay | Admitting: Internal Medicine

## 2016-10-18 DIAGNOSIS — M25551 Pain in right hip: Secondary | ICD-10-CM

## 2016-10-18 DIAGNOSIS — M25552 Pain in left hip: Principal | ICD-10-CM

## 2016-10-18 IMAGING — CR DG CHEST 1V
1 series · 1 of 1 positions shown · non-contrast
Comparison: the previous day's study

CLINICAL DATA: CHF

EXAM:
CHEST  1 VIEW

[AP]
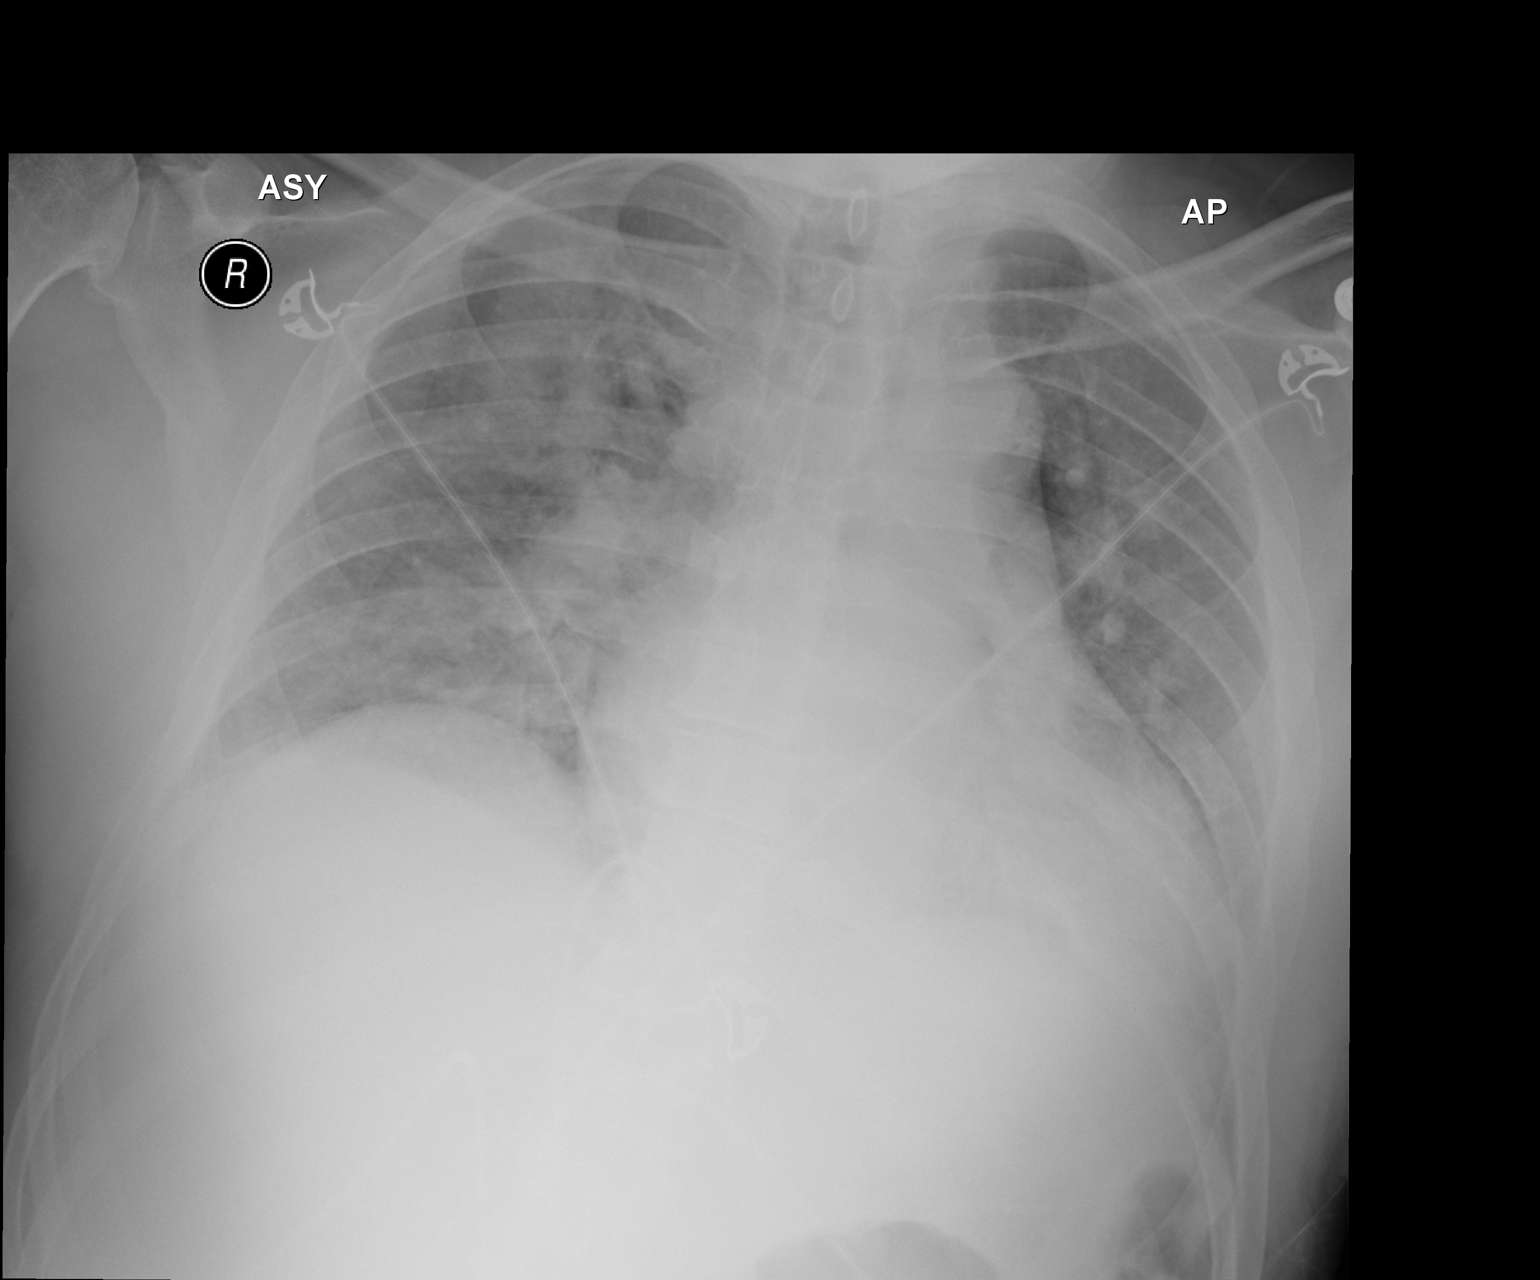

[1 of 1 positions shown; findings below may reference images not displayed]

FINDINGS: worsening diffuse airspace opacities bilaterally, right greater than
left. mild cardiomegaly stable. blunting of lateral costophrenic
angles suggesting small effusions. Visualized skeletal structures
are unremarkable.
IMPRESSION: 1. worsening asymmetric infiltrates or edema.

## 2016-10-18 NOTE — Telephone Encounter (Signed)
Pt needs a refill on percocet. Please advise. Thanks! ep

## 2016-10-21 ENCOUNTER — Other Ambulatory Visit: Payer: Self-pay | Admitting: Internal Medicine

## 2016-10-21 DIAGNOSIS — M25552 Pain in left hip: Principal | ICD-10-CM

## 2016-10-21 DIAGNOSIS — M25551 Pain in right hip: Secondary | ICD-10-CM

## 2016-10-21 NOTE — Telephone Encounter (Signed)
Pt is calling for a refill on his percocet to be left up front. jw

## 2016-10-21 NOTE — Telephone Encounter (Signed)
Patient needs to be seen in the clinic before getting pain medication refills, as discussed at our last visit. Please let him know.

## 2016-10-22 NOTE — Telephone Encounter (Signed)
Spoke with patient. PCP was not available until 12/29 patient stated he could not wait that long. Scheduled pt with dr Juanito Doom for refills. Nat Christen, CMA

## 2016-10-26 ENCOUNTER — Encounter: Payer: Self-pay | Admitting: Family Medicine

## 2016-10-26 ENCOUNTER — Ambulatory Visit (INDEPENDENT_AMBULATORY_CARE_PROVIDER_SITE_OTHER): Payer: Medicare HMO | Admitting: Family Medicine

## 2016-10-26 DIAGNOSIS — G894 Chronic pain syndrome: Secondary | ICD-10-CM | POA: Diagnosis not present

## 2016-10-26 DIAGNOSIS — M25551 Pain in right hip: Secondary | ICD-10-CM

## 2016-10-26 DIAGNOSIS — M25552 Pain in left hip: Secondary | ICD-10-CM

## 2016-10-26 MED ORDER — OXYCODONE-ACETAMINOPHEN 7.5-325 MG PO TABS: 1.0000 | ORAL_TABLET | Freq: Four times a day (QID) | ORAL | 0 refills | Status: DC | PRN

## 2016-10-26 NOTE — Assessment & Plan Note (Addendum)
Indication for chronic opioid: Arthritis of hips and knees, as having a right hip placement in 2018 Medication and dose: Percocet 7.5-325 mg every 6 hours when necessary pills per month: 60 Last UDS date: None Pain contract signed (Y/N):  July 2017 Date narcotic database last reviewed (include red flags): 11/11  Discussed trying to space out Percocet to BID instead of q 6 hours. Also discussed following up with PCP for future pain medication refills. Discussed that Paul [name] should be coming in monthly for refills, Paul [name] did struggle a bit with this notion due to concerns for copays. Paul [name] would like to discuss this more with PCP. Did not obtain UDS today but Paul [name] will need in the future.

## 2016-10-26 NOTE — Progress Notes (Signed)
   Subjective:    Patient ID: Paul [name] , male   DOB: May 30, 1961 , 55 y.o..   MRN: [id number]  HPI  Paul [name] is here for  Chief Complaint  Patient presents with  . 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 Paulo Fruit for orthopedic complaints. Patient states that he sees Dr. Ola Spurr for his Percocet refills. He saw her last month. He takes Percocet 7.5-325 mg every 6. He states that the amount of Percocet he takes varies day by day. Over the last couple weeks he has had to take his Percocet every 6 hours (as it is prescribed) and he has not had any since 8 days ago. Patient states that the Percocet helps him to ambulate more without pain and reached down to put his shoes on. He denies any recent falls, dizziness, lightheadedness, change in his pain.  Review of Systems: Per HPI. All other systems reviewed and are negative.  Social Hx:  reports that he has been smoking Cigarettes.  He has a 2.50 pack-year smoking history. He has never used smokeless tobacco.   Objective:   BP (!) 146/84   Pulse 76   Temp 98.7 F (37.1 C) (Oral)   Ht 6' (1.829 m)   Wt 188 lb 12.8 oz (85.6 kg)   SpO2 97%   BMI 25.61 kg/m  Physical Exam  Gen: NAD, alert, cooperative with exam, well-appearing Cardiac: Regular rate and rhythm, normal S1/S2, no murmur, no edema, capillary refill brisk  Respiratory: Clear to auscultation bilaterally, no wheezes, non-labored breathing Psych: good insight, normal mood and affect   Assessment & Plan:  Chronic pain syndrome Indication for chronic opioid: Arthritis of hips and knees, as having a right hip placement in 2018 Medication and dose: Percocet 7.5-325 mg every 6 hours when necessary pills per month: 60 Last UDS date: None Pain contract signed (Y/N):  July 2017 Date narcotic database last reviewed (include red flags): 11/11  Discussed trying to space out Percocet to BID instead of q 6 hours. Also discussed  following up with PCP for future pain medication refills. Discussed that he should be coming in monthly for refills, he did struggle a bit with this notion due to concerns for copays. He would like to discuss this more with PCP. Did not obtain UDS today but he will need in the future.   Smitty Cords, MD [place], PGY-2

## 2016-10-26 NOTE — Patient Instructions (Addendum)
Thank you for coming in today, it was so nice to see you! Today we talked about:    Pain medication: I have provided a refill for you. Please take as prescribed.  Please follow up in 1 month with Dr. Ola Spurr. You can schedule this appointment at the front desk before you leave or call the clinic.  Bring in all your medications or supplements to each appointment for review.   If you have any questions or concerns, please do not hesitate to call the office at [phone]. You can also message me directly via MyChart.   Sincerely,  Smitty Cords, MD

## 2016-12-05 ENCOUNTER — Encounter (HOSPITAL_COMMUNITY): Payer: Self-pay | Admitting: Emergency Medicine

## 2016-12-05 ENCOUNTER — Ambulatory Visit (HOSPITAL_COMMUNITY)
Admission: EM | Admit: 2016-12-05 | Discharge: 2016-12-05 | Disposition: A | Discharge status: Home / Self Care | Payer: Medicare HMO | Attending: Family Medicine | Admitting: Family Medicine

## 2016-12-05 DIAGNOSIS — M25551 Pain in right hip: Secondary | ICD-10-CM | POA: Diagnosis not present

## 2016-12-05 DIAGNOSIS — L02411 Cutaneous abscess of right axilla: Secondary | ICD-10-CM | POA: Diagnosis not present

## 2016-12-05 DIAGNOSIS — M25552 Pain in left hip: Secondary | ICD-10-CM | POA: Diagnosis not present

## 2016-12-05 MED ORDER — KETOROLAC TROMETHAMINE 30 MG/ML IJ SOLN
INTRAMUSCULAR | Status: AC
  Filled 2016-12-05: qty 1

## 2016-12-05 MED ORDER — LIDOCAINE-EPINEPHRINE (PF) 2 %-1:200000 IJ SOLN
INTRAMUSCULAR | Status: AC
  Filled 2016-12-05: qty 20

## 2016-12-05 MED ORDER — KETOROLAC TROMETHAMINE 30 MG/ML IJ SOLN
30.0000 mg | Freq: Once | INTRAMUSCULAR | Status: AC
  Administered 2016-12-05: 30 mg via INTRAMUSCULAR

## 2016-12-05 MED ORDER — DOXYCYCLINE HYCLATE 100 MG PO CAPS: 100.0000 mg | ORAL_CAPSULE | Freq: Two times a day (BID) | ORAL | 0 refills | Status: DC

## 2016-12-05 NOTE — ED Provider Notes (Addendum)
CSN: [id number]     Arrival date & time 12/05/16  1444 History   First MD Initiated Contact with Patient 12/05/16 1636     Chief Complaint  Patient presents with  . Abscess   (Consider location/radiation/quality/duration/timing/severity/associated sxs/prior Treatment) C/o right axilla abscess.   The history is provided by the patient.  Abscess  Location:  Torso Torso abscess location:  R axilla Size:  3 Abscess quality: draining ( cm), fluctuance, induration, painful, redness and weeping   Red streaking: no   Duration:  1 week Progression:  Unchanged Pain details:    Quality:  Aching   Severity:  Moderate   Duration:  1 week   Timing:  Constant   Progression:  Unchanged Chronicity:  New Relieved by:  Nothing Worsened by:  Nothing Ineffective treatments:  None tried   Past Medical History:  Diagnosis Date  . Arthritis   . CAD (coronary artery disease)    a. 03/2015 NSTEMI/PCI: LM nl, LAD 95p (3.5x20 Promus DES), D1 60, D2/3 mild dzs, LCX 31m (plan for staged pci), OM1/2 min irregs, RCA 20p, RPDA 60.  Marland Kitchen CKD (chronic kidney disease), stage III   . GERD (gastroesophageal reflux disease)    occ  . HOCM (hypertrophic obstructive cardiomyopathy) ([place])    a. 03/2015 Echo: EF 65-60%, sev LV thickening w/ bright, speckled appearance of IV septum, Gr 2 DD, AoV sclerosis, triv AI, mild MR, LA 83ml/m^2, RA 30ml/m^2, aneurysmal IAS, mod TR, PASP 29mmHg.  Marland Kitchen Hypertension    denies  . PAF (paroxysmal atrial fibrillation) ([place])    remote isolated episode - 09/2012.  . TMJ arthralgia    Past Surgical History:  Procedure Laterality Date  . APPENDECTOMY    . CARDIAC CATHETERIZATION N/A 03/19/2015   Procedure: Left Heart Cath and Coronary Angiography;  Surgeon: [name], MD;  Location: [place] [facility];  Service: Cardiovascular;  Laterality: N/A;  . CARDIAC CATHETERIZATION  03/19/2015   Procedure: Coronary Stent Intervention;  Surgeon: [name], MD;  Location: [place] [facility];  Service: Cardiovascular;;  . CARDIAC CATHETERIZATION N/A 04/09/2015   Procedure: Coronary Stent Intervention;  Surgeon: [name], MD;  Location: [place] [facility];  Service: Cardiovascular;  Laterality: N/A;  . HIP SURGERY Left 74   lft   . LAPAROSCOPIC APPENDECTOMY  09/22/2012   Procedure: APPENDECTOMY LAPAROSCOPIC;  Surgeon: Harl Bowie, MD;  Location: [facility];  Service: General;;  . LAPAROSCOPY  09/22/2012   Procedure: LAPAROSCOPY DIAGNOSTIC;  Surgeon: Harl Bowie, MD;  Location: [facility];  Service: General;  Laterality: N/A;  . TOTAL HIP ARTHROPLASTY Right 03,01   x2  . TOTAL HIP ARTHROPLASTY WITH HARDWARE REMOVAL Left 08/05/2014   Procedure: TOTAL HIP ARTHROPLASTY WITH HARDWARE REMOVAL;  Surgeon: Kerin Salen, MD;  Location: [place];  Service: Orthopedics;  Laterality: Left;   Family History  Problem Relation Age of Onset  . Heart disease Mother   . Heart disease Father    Social History  Substance Use Topics  . Smoking status: Light Tobacco Smoker    Packs/day: 0.25    Years: 10.00    Types: Cigarettes  . Smokeless tobacco: Never Used  . Alcohol use 1.2 oz/week    2 Cans of beer per week     Comment: occasional    Review of Systems  Constitutional: Negative.   HENT: Negative.   Eyes: Negative.   Respiratory: Negative.   Cardiovascular: Negative.   Gastrointestinal: Negative.  Endocrine: Negative.   Genitourinary: Negative.   Musculoskeletal: Negative.   Skin: Positive for wound.  Allergic/Immunologic: Negative.   Neurological: Negative.   Hematological: Negative.   Psychiatric/Behavioral: Negative.     Allergies  Ibuprofen and Nsaids  Home Medications   Prior to Admission medications   Medication Sig Start Date End Date Taking? Authorizing Provider  acetaminophen (TYLENOL) 325 MG tablet Take 2 tablets (650 mg total) by mouth every 4 (four) hours as needed for headache or mild pain. 04/10/15   Erlene Quan, PA-C  aspirin  EC 81 MG EC tablet Take 1 tablet (81 mg total) by mouth daily. 03/20/15   Rogelia Mire, NP  atorvastatin (LIPITOR) 80 MG tablet Take 1 tablet (80 mg total) by mouth daily at 6 PM. Patient not taking: Reported on 12/05/2016 03/20/15   Rogelia Mire, NP  baclofen (LIORESAL) 10 MG tablet Take 1 tablet (10 mg total) by mouth 3 (three) times daily as needed for muscle spasms. Patient not taking: Reported on 12/05/2016 04/10/15   Erlene Quan, PA-C  cetirizine (ZYRTEC) 10 MG tablet TAKE 1 TABLET BY MOUTH EVERY DAY Patient not taking: Reported on 12/05/2016 12/15/15   Frazier Richards, MD  doxycycline (VIBRAMYCIN) 100 MG capsule Take 1 capsule (100 mg total) by mouth 2 (two) times daily. 12/05/16   Lysbeth Penner, FNP  fluticasone (FLONASE) 50 MCG/ACT nasal spray Place 1 spray into both nostrils daily.    Historical Provider, MD  metoprolol tartrate (LOPRESSOR) 25 MG tablet Take 1 tablet (25 mg total) by mouth 2 (two) times daily. Patient not taking: Reported on 12/05/2016 03/20/15   Rogelia Mire, NP  montelukast (SINGULAIR) 10 MG tablet Take 1 tablet (10 mg total) by mouth at bedtime. Patient not taking: Reported on 12/05/2016 11/20/14   Frazier Richards, MD  nitroGLYCERIN (NITROSTAT) 0.4 MG SL tablet Place 1 tablet (0.4 mg total) under the tongue every 5 (five) minutes x 3 doses as needed for chest pain. Patient not taking: Reported on 12/05/2016 03/20/15   Rogelia Mire, NP  oxyCODONE-acetaminophen (PERCOCET) 7.5-325 MG tablet Take 1 tablet by mouth every 6 (six) hours as needed for severe pain. Patient not taking: Reported on 12/05/2016 10/26/16   Carlyle Dolly, MD  predniSONE (DELTASONE) 20 MG tablet Take 1 tablet (20 mg total) by mouth daily with breakfast. Patient not taking: Reported on 12/05/2016 09/28/16   Rogue Bussing, MD  sildenafil (VIAGRA) 100 MG tablet Take 1 tablet (100 mg total) by mouth daily as needed for erectile dysfunction. 30 min-4 hrs before sexual activity.  Do not use with nitro. Patient not taking: Reported on 12/05/2016 06/01/16   Rogue Bussing, MD  ticagrelor (BRILINTA) 90 MG TABS tablet Take 1 tablet (90 mg total) by mouth 2 (two) times daily. Patient not taking: Reported on 12/05/2016 03/20/15   Rogelia Mire, NP   Meds Ordered and Administered this Visit   Medications  ketorolac (TORADOL) 30 MG/ML injection 30 mg (not administered)    BP 104/77 (BP Location: Right Arm)   Pulse (!) 124   Resp 18   SpO2 100%  No data found.   Physical Exam  Constitutional: He appears well-developed and well-nourished.  HENT:  Head: Normocephalic and atraumatic.  Eyes: Conjunctivae and EOM are normal. Pupils are equal, round, and reactive to light.  Neck: Normal range of motion. Neck supple.  Cardiovascular: Normal rate, regular rhythm and normal heart sounds.   Pulmonary/Chest: Effort normal.  Skin:  Right axilla with 3 cm diameter abscess.  Nursing note and vitals reviewed.   Urgent Care Course     .Marland KitchenIncision and Drainage Date/Time: 12/27/2016 11:34 AM Performed by: Lysbeth Penner Authorized by: Ihor Gully D   Consent:    Consent obtained:  Verbal   Consent given by:  Patient   Risks discussed:  Bleeding and incomplete drainage   Alternatives discussed:  No treatment Location:    Type:  Abscess   Size:  3 cm   Location:  Trunk (right axilla) Pre-procedure details:    Skin preparation:  Betadine Anesthesia (see MAR for exact dosages):    Anesthesia method:  None Procedure type:    Complexity:  Simple Procedure details:    Needle aspiration: no     Incision types:  Stab incision   Scalpel blade:  11   Drainage:  Bloody   Drainage amount:  Scant   Wound treatment:  Drain placed   Packing materials:  1/2 in gauze Post-procedure details:    Patient tolerance of procedure:  Tolerated well, no immediate complications   (including critical care time)  Labs Review Labs Reviewed - No data to  display  Imaging Review No results found.   Visual Acuity Review  Right Eye Distance:   Left Eye Distance:   Bilateral Distance:    Right Eye Near:   Left Eye Near:    Bilateral Near:         MDM   1. Abscess of axilla, right   2. Bilateral hip pain    Doxycycline $RemoveBefo'100mg'[id number]$  one po bid x 10 days Toradol $RemoveBefo'30mg'[id number]$  IM Follow up for wound check in 2 days.  Follow up with pain provider for percocet refill      Lysbeth Penner, FNP 12/05/16 [place], [place] 12/27/16 1136

## 2016-12-05 NOTE — ED Triage Notes (Signed)
Right axilla abscess for one week.  Wound is draining

## 2016-12-07 ENCOUNTER — Encounter (HOSPITAL_COMMUNITY): Payer: Self-pay | Admitting: Emergency Medicine

## 2016-12-07 ENCOUNTER — Ambulatory Visit (HOSPITAL_COMMUNITY)
Admission: EM | Admit: 2016-12-07 | Discharge: 2016-12-07 | Disposition: A | Discharge status: Home / Self Care | Payer: Medicare HMO | Attending: Family Medicine | Admitting: Family Medicine

## 2016-12-07 ENCOUNTER — Telehealth: Payer: Self-pay | Admitting: Internal Medicine

## 2016-12-07 DIAGNOSIS — Z5189 Encounter for other specified aftercare: Secondary | ICD-10-CM

## 2016-12-07 NOTE — Telephone Encounter (Signed)
Attempted to schedule pt an appt. He declined and wants dr fitzgerald to call him. Deseree Kennon Holter, CMA

## 2016-12-07 NOTE — ED Triage Notes (Addendum)
Pt here for a wound check.  He has packing in the wound and states his bandage is saturated.  Bandage taken off.  Same dressing from the day he came here.  Dressing was dirty.

## 2016-12-07 NOTE — Telephone Encounter (Signed)
Please inform patient he needs appointments for pain medication refills. This has been discussed at his last 2 appointments. Thank you. Olene Floss, MD PGY2

## 2016-12-07 NOTE — ED Provider Notes (Signed)
CSN: [id number]     Arrival date & time 12/07/16  1955 History   None    Chief Complaint  Patient presents with  . Wound Check   (Consider location/radiation/quality/duration/timing/severity/associated sxs/prior Treatment) 56 year old male presents to clinic for evaluation of a wound. Patient had an abscess in his right axillary area, drained two days ago in clinic. Wound was packed and bandaged, the provider requested patient come back in 2 days for reevaluation. He has had no fever, no signs of infection.   The history is provided by the patient.  Wound Check     Past Medical History:  Diagnosis Date  . Arthritis   . CAD (coronary artery disease)    a. 03/2015 NSTEMI/PCI: LM nl, LAD 95p (3.5x20 Promus DES), D1 60, D2/3 mild dzs, LCX 78m (plan for staged pci), OM1/2 min irregs, RCA 20p, RPDA 60.  Marland Kitchen CKD (chronic kidney disease), stage III   . GERD (gastroesophageal reflux disease)    occ  . HOCM (hypertrophic obstructive cardiomyopathy) ([place])    a. 03/2015 Echo: EF 65-60%, sev LV thickening w/ bright, speckled appearance of IV septum, Gr 2 DD, AoV sclerosis, triv AI, mild MR, LA 54ml/m^2, RA 63ml/m^2, aneurysmal IAS, mod TR, PASP 42mmHg.  Marland Kitchen Hypertension    denies  . PAF (paroxysmal atrial fibrillation) ([place])    remote isolated episode - 09/2012.  . TMJ arthralgia    Past Surgical History:  Procedure Laterality Date  . APPENDECTOMY    . CARDIAC CATHETERIZATION N/A 03/19/2015   Procedure: Left Heart Cath and Coronary Angiography;  Surgeon: [name], MD;  Location: [facility];  Service: Cardiovascular;  Laterality: N/A;  . CARDIAC CATHETERIZATION  03/19/2015   Procedure: Coronary Stent Intervention;  Surgeon: [name], MD;  Location: [place];  Service: Cardiovascular;;  . CARDIAC CATHETERIZATION N/A 04/09/2015   Procedure: Coronary Stent Intervention;  Surgeon: [name], MD;  Location: [place];  Service: Cardiovascular;  Laterality:  N/A;  . HIP SURGERY Left 74   lft   . LAPAROSCOPIC APPENDECTOMY  09/22/2012   Procedure: APPENDECTOMY LAPAROSCOPIC;  Surgeon: Harl Bowie, MD;  Location[facility];  Service: General;;  . LAPAROSCOPY  09/22/2012   Procedure: LAPAROSCOPY DIAGNOSTIC;  Surgeon: Harl Bowie, MD;  [facility];  Service: General;  Laterality: N/A;  . TOTAL HIP ARTHROPLASTY Right 03,01   x2  . TOTAL HIP ARTHROPLASTY WITH HARDWARE REMOVAL Left 08/05/2014   Procedure: TOTAL HIP ARTHROPLASTY WITH HARDWARE REMOVAL;  Surgeon: Kerin Salen, MD;  Location: [facility];  Service: Orthopedics;  Laterality: Left;   Family History  Problem Relation Age of Onset  . Heart disease Mother   . Heart disease Father    Social History  Substance Use Topics  . Smoking status: Light Tobacco Smoker    Packs/day: 0.25    Years: 10.00    Types: Cigarettes  . Smokeless tobacco: Never Used  . Alcohol use 1.2 oz/week    2 Cans of beer per week     Comment: occasional    Review of Systems  Reason unable to perform ROS: as covered in HPI.    Allergies  Ibuprofen and Nsaids  Home Medications   Prior to Admission medications   Medication Sig Start Date End Date Taking? Authorizing Provider  doxycycline (VIBRAMYCIN) 100 MG capsule Take 1 capsule (100 mg total) by mouth 2 (two) times daily. 12/05/16  Yes Lysbeth Penner, FNP  ticagrelor (BRILINTA) 90 MG TABS tablet Take 1 tablet (90 mg total) by mouth 2 (two) times daily. 03/20/15  Yes Ok Anis, NP  acetaminophen (TYLENOL) 325 MG tablet Take 2 tablets (650 mg total) by mouth every 4 (four) hours as needed for headache or mild pain. 04/10/15   Abelino Derrick, PA-C  aspirin EC 81 MG EC tablet Take 1 tablet (81 mg total) by mouth daily. 03/20/15   Ok Anis, NP  atorvastatin (LIPITOR) 80 MG tablet Take 1 tablet (80 mg total) by mouth daily at 6 PM. 03/20/15   Ok Anis, NP  baclofen (LIORESAL) 10 MG tablet Take 1 tablet (10 mg total) by mouth 3  (three) times daily as needed for muscle spasms. 04/10/15   Abelino Derrick, PA-C  cetirizine (ZYRTEC) 10 MG tablet TAKE 1 TABLET BY MOUTH EVERY DAY 12/15/15   Abram Sander, MD  fluticasone [facility]) 50 MCG/ACT nasal spray Place 1 spray into both nostrils daily.    Historical Provider, MD  metoprolol tartrate (LOPRESSOR) 25 MG tablet Take 1 tablet (25 mg total) by mouth 2 (two) times daily. 03/20/15   Ok Anis, NP  montelukast (SINGULAIR) 10 MG tablet Take 1 tablet (10 mg total) by mouth at bedtime. 11/20/14   Abram Sander, MD  nitroGLYCERIN (NITROSTAT) 0.4 MG SL tablet Place 1 tablet (0.4 mg total) under the tongue every 5 (five) minutes x 3 doses as needed for chest pain. 03/20/15   Ok Anis, NP  oxyCODONE-acetaminophen (PERCOCET) 7.5-325 MG tablet Take 1 tablet by mouth every 6 (six) hours as needed for severe pain. 10/26/16   Beaulah Dinning, MD  predniSONE (DELTASONE) 20 MG tablet Take 1 tablet (20 mg total) by mouth daily with breakfast. 09/28/16   Casey Burkitt, MD  sildenafil (VIAGRA) 100 MG tablet Take 1 tablet (100 mg total) by mouth daily as needed for erectile dysfunction. 30 min-4 hrs before sexual activity. Do not use with nitro. 06/01/16   Hillary Percell Boston, MD   Meds Ordered and Administered this Visit  Medications - No data to display  BP 134/86 (BP Location: Left Arm)   Pulse 73   Temp 98.2 F (36.8 C) (Oral)   SpO2 97%  No data found.   Physical Exam  Constitutional: He is oriented to person, place, and time. He appears well-developed and well-nourished. No distress.  Neurological: He is alert and oriented to person, place, and time.  Skin: Skin is warm and dry. Capillary refill takes less than 2 seconds. He is not diaphoretic.  2x2 cm wound, right axillary, packing removed, appears well healing.  Psychiatric: He has a normal mood and affect.  Nursing note and vitals reviewed.   Urgent Care Course     Procedures (including critical  care time)  Labs Review Labs Reviewed - No data to display  Imaging Review No results found.   Visual Acuity Review  Right Eye Distance:   Left Eye Distance:   Bilateral Distance:    Right Eye Near:   Left Eye Near:    Bilateral Near:         MDM   1. Visit for wound check   Your wound is healing well and is no longer draining puss. The packing has been removed and your wound has been covered with a bandage. Replace the bandage every 12 hours, keep it clean and dry. It will start to heal from the inside out. Should it become infected  follow up with your primary care provider or return to clinic as needed.      Barnet Glasgow, NP 12/07/16 2123

## 2016-12-07 NOTE — Telephone Encounter (Signed)
Pt is calling for a refill on his pain medication. He was seen at [facility] over the weekend for an abscess tooth and they can not refill the medication. Please let patient know when he can pick this up, jw

## 2016-12-07 NOTE — Discharge Instructions (Signed)
Your wound is healing well and is no longer draining puss. The packing has been removed and your wound has been covered with a bandage. Replace the bandage every 12 hours, keep it clean and dry. It will start to heal from the inside out. Should it become infected follow up with your primary care provider or return to clinic as needed.

## 2016-12-08 NOTE — Telephone Encounter (Signed)
Spoke with patient about need to be seen in clinic before getting pain medication refills. He says he had an abscess under his arm that was drained and is painful but urgent care would not provide pain medication since he has a pain medication contract with [facility]. He has been out of pain medication for last couple of weeks. Co-pay has been a barrier to more frequent visits. Discussed could consider receiving more months of Rx at a time bu t that he is due for UDS. He expressed understanding and said he would make appointment.  Olene Floss, MD PGY-2

## 2017-07-08 ENCOUNTER — Telehealth: Payer: Self-pay | Admitting: Internal Medicine

## 2017-07-08 NOTE — Telephone Encounter (Signed)
[employer] assistance program forms dropped off for at front desk for completion.  Verified that patient section of form has been completed.  Last DOS/WCC with PCP was 09/28/16.  Placed form in team folder to be completed by clinical staff.  Crista Luria

## 2017-07-08 NOTE — Telephone Encounter (Signed)
Forms placed in PCP box. 

## 2017-07-12 NOTE — Telephone Encounter (Signed)
Completed form for financial assistance for viagra and will provide to patient at appointment tomorrow, 9/5, as requested. Contact number provided for [employer] Patient Assistance Program is [phone].

## 2017-07-13 ENCOUNTER — Encounter: Payer: Self-pay | Admitting: Internal Medicine

## 2017-07-13 ENCOUNTER — Ambulatory Visit (INDEPENDENT_AMBULATORY_CARE_PROVIDER_SITE_OTHER): Payer: Medicare HMO | Admitting: Internal Medicine

## 2017-07-13 VITALS — BP 148/85 | HR 74 | Temp 98.1°F | Ht 72.0 in | Wt 185.0 lb

## 2017-07-13 DIAGNOSIS — N184 Chronic kidney disease, stage 4 (severe): Secondary | ICD-10-CM

## 2017-07-13 DIAGNOSIS — G894 Chronic pain syndrome: Secondary | ICD-10-CM | POA: Diagnosis not present

## 2017-07-13 DIAGNOSIS — M25551 Pain in right hip: Secondary | ICD-10-CM | POA: Diagnosis not present

## 2017-07-13 DIAGNOSIS — M25552 Pain in left hip: Secondary | ICD-10-CM

## 2017-07-13 DIAGNOSIS — E785 Hyperlipidemia, unspecified: Secondary | ICD-10-CM | POA: Diagnosis not present

## 2017-07-13 DIAGNOSIS — R7989 Other specified abnormal findings of blood chemistry: Secondary | ICD-10-CM

## 2017-07-13 MED ORDER — CETIRIZINE HCL 10 MG PO TABS: 10.0000 mg | ORAL_TABLET | Freq: Every day | ORAL | 11 refills | Status: DC

## 2017-07-13 MED ORDER — OXYCODONE-ACETAMINOPHEN 7.5-325 MG PO TABS: 1.0000 | ORAL_TABLET | Freq: Four times a day (QID) | ORAL | 0 refills | Status: DC | PRN

## 2017-07-13 NOTE — Patient Instructions (Signed)
Mr. Frontera,  Please take tylenol as needed throughout the day. Take percocet for severe pain. Continue water therapies to stay strong. Please contact your surgeon to determine who might be able to operate locally.  You are due for a colonoscopy.  Try zyrtec for itching.  I would like to see you back next month (October) to check your blood pressure and pain control.  Best, Dr. Ola Spurr
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
## 2017-07-17 DIAGNOSIS — N184 Chronic kidney disease, stage 4 (severe): Secondary | ICD-10-CM | POA: Insufficient documentation

## 2017-07-17 LAB — COMPREHENSIVE METABOLIC PANEL
ALBUMIN: 4.3 g/dL (ref 3.5–5.5)
ALT: 15 IU/L (ref 0–44)
AST: 17 IU/L (ref 0–40)
Albumin/Globulin Ratio: 1.7 (ref 1.2–2.2)
Alkaline Phosphatase: 81 IU/L (ref 39–117)
BILIRUBIN TOTAL: 1.5 mg/dL — AB (ref 0.0–1.2)
BUN/Creatinine Ratio: 10 (ref 9–20)
BUN: 22 mg/dL (ref 6–24)
CHLORIDE: 109 mmol/L — AB (ref 96–106)
CO2: 22 mmol/L (ref 20–29)
Calcium: 9.1 mg/dL (ref 8.7–10.2)
Creatinine, Ser: 2.1 mg/dL — ABNORMAL HIGH (ref 0.76–1.27)
GFR, EST AFRICAN AMERICAN: 39 mL/min/{1.73_m2} — AB (ref >59)
GFR, EST NON AFRICAN AMERICAN: 34 mL/min/{1.73_m2} — AB (ref >59)
GLUCOSE: 93 mg/dL (ref 65–99)
Globulin, Total: 2.5 g/dL (ref 1.5–4.5)
Potassium: 4.8 mmol/L (ref 3.5–5.2)
Sodium: 144 mmol/L (ref 134–144)
Total Protein: 6.8 g/dL (ref 6.0–8.5)

## 2017-07-17 LAB — LIPID PANEL
CHOL/HDL RATIO: 2.8 ratio (ref 0.0–5.0)
Cholesterol, Total: 159 mg/dL (ref 100–199)
HDL: 56 mg/dL (ref >39)
LDL Calculated: 89 mg/dL (ref 0–99)
Triglycerides: 72 mg/dL (ref 0–149)
VLDL CHOLESTEROL CAL: 14 mg/dL (ref 5–40)

## 2017-07-17 LAB — TOXASSURE SELECT 13 (MW), URINE

## 2017-07-17 NOTE — Assessment & Plan Note (Addendum)
-   Poorly controlled. To have hip surgery per patient. Recommended regular use of tylenol and to use percocet for severe pain.  - Advised patient to contact his last surgeon to check into status of outside referral. - Anticipate patient will need both renal and cardiac clearance prior to having surgery. History of poor follow-up, so advised seeing his nephrologist first and will re-address need to see his cardiologist at follow-up next month. - Stressed importance of regular visits and that pain medication will not be prescribed without appointments. - Provided list of transportation options.   Indication for chronic opioid: debilitating hip pain Medication and dose: percocet 7.5-325 mg # pills per month: 60 Last UDS date: none on record. Ordered today.  Pain contract signed (Y/N): Y, 05/2016 Date narcotic database last reviewed (include red flags): No fills recorded over last year, but confirmed with patient's pharmacy medication had been obtained.

## 2017-07-17 NOTE — Assessment & Plan Note (Signed)
-   Ordered CMP to check renal function and electrolytes - Stressed importance of patient following up with [facility]

## 2017-07-17 NOTE — Assessment & Plan Note (Signed)
-   Not on medical treatment. Will check lipid panel today to calculate current ASCVD risk.

## 2017-07-19 ENCOUNTER — Telehealth: Payer: Self-pay | Admitting: Internal Medicine

## 2017-07-19 NOTE — Telephone Encounter (Signed)
Left message for patient with recent lab results. 10-year ASCVD risk is 14.8%. Let him know I would recommend restarting statin therapy and aspirin. Said we could discuss this at follow-up next month. He has had poor adherence to these medications in the past.   Also let patient know there was a concerning result on his UDS. Will discuss further in person but will not plan on prescribing more opioid pain medication at this time.   Olene Floss, MD [place], PGY-3

## 2017-07-21 ENCOUNTER — Telehealth: Payer: Self-pay | Admitting: Internal Medicine

## 2017-07-21 NOTE — Telephone Encounter (Signed)
Left message for patient that his viagra prescription had arrived by mail and was available at [facility] front office for pickup.   Olene Floss, MD [place], PGY-3

## 2017-08-29 ENCOUNTER — Ambulatory Visit: Payer: Medicare HMO | Admitting: Internal Medicine

## 2017-09-06 ENCOUNTER — Ambulatory Visit: Payer: Medicare HMO | Admitting: Internal Medicine

## 2017-09-07 ENCOUNTER — Encounter: Payer: Self-pay | Admitting: Internal Medicine

## 2017-09-07 NOTE — Progress Notes (Signed)
Patient had appointment scheduled for 09/06/17 for follow-up of hip pain. Would not prescribe narcotic pain medication for patient in future due missed appointments, cocaine metabolites on recent UDS, and review of [place] Controlled Substance Database 09/06/17 showing refill of pain medication within 2 weeks of each other (should have been at least 30 days in between).   Olene Floss, MD [place], PGY-3

## 2017-10-12 ENCOUNTER — Ambulatory Visit: Payer: Medicare HMO | Admitting: Internal Medicine

## 2017-10-21 ENCOUNTER — Other Ambulatory Visit: Payer: Self-pay | Admitting: Internal Medicine

## 2017-10-21 DIAGNOSIS — N529 Male erectile dysfunction, unspecified: Secondary | ICD-10-CM

## 2017-10-21 MED ORDER — SILDENAFIL CITRATE 100 MG PO TABS: 100.0000 mg | ORAL_TABLET | Freq: Every day | ORAL | 0 refills | Status: DC | PRN

## 2017-10-21 NOTE — Telephone Encounter (Signed)
Pt wants  Rx for viagra faxed to [facility] at [phone].

## 2017-10-21 NOTE — Telephone Encounter (Signed)
Printed rx for #90 pills, no refills and placed in fax pile with coversheet to [employer] assistance program as requested.  Olene Floss, MD [place], PGY-3

## 2017-10-21 NOTE — Telephone Encounter (Signed)
Will forward to MD to print this script and we can place up front to be faxed. Bryndan Bilyk,CMA

## 2017-11-02 ENCOUNTER — Telehealth: Payer: Self-pay | Admitting: *Deleted

## 2017-11-02 NOTE — Telephone Encounter (Signed)
Attempted to call pt, no answer. Left pt VM to pick up samples from upfront. Christian Treadway Kennon Holter, CMA

## 2018-02-06 DIAGNOSIS — R188 Other ascites: Secondary | ICD-10-CM | POA: Diagnosis present

## 2018-02-09 ENCOUNTER — Ambulatory Visit (INDEPENDENT_AMBULATORY_CARE_PROVIDER_SITE_OTHER): Payer: Medicare HMO | Admitting: Internal Medicine

## 2018-02-09 ENCOUNTER — Inpatient Hospital Stay (HOSPITAL_COMMUNITY)
Admission: EM | Admit: 2018-02-09 | Discharge: 2018-02-19 | DRG: 291 | Disposition: A | Discharge status: Home / Self Care | Payer: Medicare HMO | Attending: Family Medicine | Admitting: Family Medicine

## 2018-02-09 ENCOUNTER — Emergency Department (HOSPITAL_COMMUNITY): Payer: Medicare HMO

## 2018-02-09 ENCOUNTER — Other Ambulatory Visit: Payer: Self-pay

## 2018-02-09 ENCOUNTER — Encounter: Payer: Self-pay | Admitting: Internal Medicine

## 2018-02-09 VITALS — BP 119/100 | HR 129 | Temp 98.6°F | Wt 197.4 lb

## 2018-02-09 DIAGNOSIS — E872 Acidosis: Secondary | ICD-10-CM | POA: Diagnosis not present

## 2018-02-09 DIAGNOSIS — N170 Acute kidney failure with tubular necrosis: Secondary | ICD-10-CM | POA: Diagnosis present

## 2018-02-09 DIAGNOSIS — G894 Chronic pain syndrome: Secondary | ICD-10-CM | POA: Diagnosis present

## 2018-02-09 DIAGNOSIS — M26629 Arthralgia of temporomandibular joint, unspecified side: Secondary | ICD-10-CM | POA: Diagnosis present

## 2018-02-09 DIAGNOSIS — J189 Pneumonia, unspecified organism: Secondary | ICD-10-CM | POA: Diagnosis not present

## 2018-02-09 DIAGNOSIS — I2510 Atherosclerotic heart disease of native coronary artery without angina pectoris: Secondary | ICD-10-CM | POA: Diagnosis present

## 2018-02-09 DIAGNOSIS — J9601 Acute respiratory failure with hypoxia: Secondary | ICD-10-CM

## 2018-02-09 DIAGNOSIS — Z886 Allergy status to analgesic agent status: Secondary | ICD-10-CM

## 2018-02-09 DIAGNOSIS — K7200 Acute and subacute hepatic failure without coma: Secondary | ICD-10-CM | POA: Diagnosis present

## 2018-02-09 DIAGNOSIS — I130 Hypertensive heart and chronic kidney disease with heart failure and stage 1 through stage 4 chronic kidney disease, or unspecified chronic kidney disease: Principal | ICD-10-CM | POA: Diagnosis present

## 2018-02-09 DIAGNOSIS — I480 Paroxysmal atrial fibrillation: Secondary | ICD-10-CM | POA: Diagnosis present

## 2018-02-09 DIAGNOSIS — N184 Chronic kidney disease, stage 4 (severe): Secondary | ICD-10-CM | POA: Diagnosis present

## 2018-02-09 DIAGNOSIS — J9600 Acute respiratory failure, unspecified whether with hypoxia or hypercapnia: Secondary | ICD-10-CM

## 2018-02-09 DIAGNOSIS — R945 Abnormal results of liver function studies: Secondary | ICD-10-CM | POA: Diagnosis not present

## 2018-02-09 DIAGNOSIS — I509 Heart failure, unspecified: Secondary | ICD-10-CM

## 2018-02-09 DIAGNOSIS — N189 Chronic kidney disease, unspecified: Secondary | ICD-10-CM | POA: Diagnosis not present

## 2018-02-09 DIAGNOSIS — Z96643 Presence of artificial hip joint, bilateral: Secondary | ICD-10-CM | POA: Diagnosis present

## 2018-02-09 DIAGNOSIS — R0602 Shortness of breath: Secondary | ICD-10-CM

## 2018-02-09 DIAGNOSIS — Z8249 Family history of ischemic heart disease and other diseases of the circulatory system: Secondary | ICD-10-CM

## 2018-02-09 DIAGNOSIS — Z955 Presence of coronary angioplasty implant and graft: Secondary | ICD-10-CM

## 2018-02-09 DIAGNOSIS — I4892 Unspecified atrial flutter: Secondary | ICD-10-CM | POA: Diagnosis not present

## 2018-02-09 DIAGNOSIS — M549 Dorsalgia, unspecified: Secondary | ICD-10-CM

## 2018-02-09 DIAGNOSIS — E785 Hyperlipidemia, unspecified: Secondary | ICD-10-CM | POA: Diagnosis present

## 2018-02-09 DIAGNOSIS — R109 Unspecified abdominal pain: Secondary | ICD-10-CM

## 2018-02-09 DIAGNOSIS — I5023 Acute on chronic systolic (congestive) heart failure: Secondary | ICD-10-CM | POA: Diagnosis not present

## 2018-02-09 DIAGNOSIS — I361 Nonrheumatic tricuspid (valve) insufficiency: Secondary | ICD-10-CM | POA: Diagnosis not present

## 2018-02-09 DIAGNOSIS — K8010 Calculus of gallbladder with chronic cholecystitis without obstruction: Secondary | ICD-10-CM | POA: Diagnosis present

## 2018-02-09 DIAGNOSIS — I471 Supraventricular tachycardia: Secondary | ICD-10-CM | POA: Diagnosis not present

## 2018-02-09 DIAGNOSIS — I252 Old myocardial infarction: Secondary | ICD-10-CM

## 2018-02-09 DIAGNOSIS — I5043 Acute on chronic combined systolic (congestive) and diastolic (congestive) heart failure: Secondary | ICD-10-CM | POA: Diagnosis present

## 2018-02-09 DIAGNOSIS — F1491 Cocaine use, unspecified, in remission: Secondary | ICD-10-CM

## 2018-02-09 DIAGNOSIS — I272 Pulmonary hypertension, unspecified: Secondary | ICD-10-CM | POA: Diagnosis present

## 2018-02-09 DIAGNOSIS — Z9114 Patient's other noncompliance with medication regimen: Secondary | ICD-10-CM

## 2018-02-09 DIAGNOSIS — Z79891 Long term (current) use of opiate analgesic: Secondary | ICD-10-CM

## 2018-02-09 DIAGNOSIS — I5033 Acute on chronic diastolic (congestive) heart failure: Secondary | ICD-10-CM | POA: Diagnosis not present

## 2018-02-09 DIAGNOSIS — Z87898 Personal history of other specified conditions: Secondary | ICD-10-CM

## 2018-02-09 DIAGNOSIS — D631 Anemia in chronic kidney disease: Secondary | ICD-10-CM | POA: Diagnosis present

## 2018-02-09 DIAGNOSIS — K219 Gastro-esophageal reflux disease without esophagitis: Secondary | ICD-10-CM | POA: Diagnosis present

## 2018-02-09 DIAGNOSIS — J9690 Respiratory failure, unspecified, unspecified whether with hypoxia or hypercapnia: Secondary | ICD-10-CM

## 2018-02-09 DIAGNOSIS — I483 Typical atrial flutter: Secondary | ICD-10-CM

## 2018-02-09 DIAGNOSIS — R579 Shock, unspecified: Secondary | ICD-10-CM | POA: Diagnosis not present

## 2018-02-09 DIAGNOSIS — R748 Abnormal levels of other serum enzymes: Secondary | ICD-10-CM | POA: Diagnosis not present

## 2018-02-09 DIAGNOSIS — I739 Peripheral vascular disease, unspecified: Secondary | ICD-10-CM | POA: Diagnosis present

## 2018-02-09 DIAGNOSIS — Z23 Encounter for immunization: Secondary | ICD-10-CM

## 2018-02-09 DIAGNOSIS — I421 Obstructive hypertrophic cardiomyopathy: Secondary | ICD-10-CM | POA: Diagnosis present

## 2018-02-09 DIAGNOSIS — N289 Disorder of kidney and ureter, unspecified: Secondary | ICD-10-CM | POA: Diagnosis not present

## 2018-02-09 DIAGNOSIS — R1084 Generalized abdominal pain: Secondary | ICD-10-CM | POA: Diagnosis not present

## 2018-02-09 DIAGNOSIS — I340 Nonrheumatic mitral (valve) insufficiency: Secondary | ICD-10-CM | POA: Diagnosis not present

## 2018-02-09 DIAGNOSIS — E875 Hyperkalemia: Secondary | ICD-10-CM | POA: Diagnosis not present

## 2018-02-09 DIAGNOSIS — R7989 Other specified abnormal findings of blood chemistry: Secondary | ICD-10-CM

## 2018-02-09 DIAGNOSIS — R079 Chest pain, unspecified: Secondary | ICD-10-CM | POA: Diagnosis present

## 2018-02-09 DIAGNOSIS — D509 Iron deficiency anemia, unspecified: Secondary | ICD-10-CM | POA: Diagnosis present

## 2018-02-09 DIAGNOSIS — F17210 Nicotine dependence, cigarettes, uncomplicated: Secondary | ICD-10-CM | POA: Diagnosis present

## 2018-02-09 DIAGNOSIS — R1013 Epigastric pain: Secondary | ICD-10-CM

## 2018-02-09 HISTORY — DX: Typical atrial flutter: I48.3

## 2018-02-09 HISTORY — DX: Heart failure, unspecified: I50.9

## 2018-02-09 HISTORY — DX: Shortness of breath: R06.02

## 2018-02-09 LAB — BASIC METABOLIC PANEL
Anion gap: 8 (ref 5–15)
BUN: 29 mg/dL — ABNORMAL HIGH (ref 6–20)
CO2: 23 mmol/L (ref 22–32)
Calcium: 8.6 mg/dL — ABNORMAL LOW (ref 8.9–10.3)
Chloride: 113 mmol/L — ABNORMAL HIGH (ref 101–111)
Creatinine, Ser: 1.96 mg/dL — ABNORMAL HIGH (ref 0.61–1.24)
GFR calc Af Amer: 41 mL/min — ABNORMAL LOW (ref >60)
GFR calc non Af Amer: 36 mL/min — ABNORMAL LOW (ref >60)
Glucose, Bld: 96 mg/dL (ref 65–99)
Potassium: 4.6 mmol/L (ref 3.5–5.1)
Sodium: 144 mmol/L (ref 135–145)

## 2018-02-09 LAB — TSH: TSH: 0.799 u[IU]/mL (ref 0.350–4.500)

## 2018-02-09 LAB — CBC
HCT: 36.3 % — ABNORMAL LOW (ref 39.0–52.0)
Hemoglobin: 11.2 g/dL — ABNORMAL LOW (ref 13.0–17.0)
MCH: 24.3 pg — ABNORMAL LOW (ref 26.0–34.0)
MCHC: 30.9 g/dL (ref 30.0–36.0)
MCV: 78.9 fL (ref 78.0–100.0)
Platelets: 196 10*3/uL (ref 150–400)
RBC: 4.6 MIL/uL (ref 4.22–5.81)
RDW: 14.5 % (ref 11.5–15.5)
WBC: 6.6 10*3/uL (ref 4.0–10.5)

## 2018-02-09 LAB — I-STAT TROPONIN, ED: Troponin i, poc: 0.06 ng/mL (ref 0.00–0.08)

## 2018-02-09 LAB — BRAIN NATRIURETIC PEPTIDE: B Natriuretic Peptide: 1737.7 pg/mL — ABNORMAL HIGH (ref 0.0–100.0)

## 2018-02-09 LAB — MAGNESIUM: Magnesium: 1.7 mg/dL (ref 1.7–2.4)

## 2018-02-09 MED ORDER — HEPARIN (PORCINE) IN NACL 100-0.45 UNIT/ML-% IJ SOLN
1500.0000 [IU]/h | INTRAMUSCULAR | Status: DC
Start: 2018-02-09 — End: 2018-02-17
  Administered 2018-02-10 (×2): 1300 [IU]/h via INTRAVENOUS
  Administered 2018-02-11 – 2018-02-15 (×6): 1450 [IU]/h via INTRAVENOUS
  Administered 2018-02-16: 1500 [IU]/h via INTRAVENOUS
  Administered 2018-02-16: 1450 [IU]/h via INTRAVENOUS
  Filled 2018-02-09 (×11): qty 250

## 2018-02-09 MED ORDER — ADENOSINE 6 MG/2ML IV SOLN
6.0000 mg | Freq: Once | INTRAVENOUS | Status: DC
Start: 2018-02-09 — End: 2018-02-09
  Filled 2018-02-09: qty 2

## 2018-02-09 MED ORDER — METOPROLOL TARTRATE 25 MG PO TABS: 50.0000 mg | ORAL_TABLET | Freq: Once | ORAL | Status: DC

## 2018-02-09 MED ORDER — METOPROLOL TARTRATE 25 MG PO TABS
12.5000 mg | ORAL_TABLET | Freq: Once | ORAL | Status: AC
  Administered 2018-02-09: 12.5 mg via ORAL
  Filled 2018-02-09: qty 1

## 2018-02-09 MED ORDER — ATORVASTATIN CALCIUM 40 MG PO TABS
40.0000 mg | ORAL_TABLET | Freq: Every day | ORAL | Status: DC
  Administered 2018-02-10: 40 mg via ORAL
  Filled 2018-02-09: qty 1

## 2018-02-09 MED ORDER — FUROSEMIDE 10 MG/ML IJ SOLN
40.0000 mg | Freq: Once | INTRAMUSCULAR | Status: AC
  Administered 2018-02-09: 40 mg via INTRAVENOUS
  Filled 2018-02-09: qty 4

## 2018-02-09 MED ORDER — METOPROLOL TARTRATE 25 MG PO TABS: 25.0000 mg | ORAL_TABLET | Freq: Two times a day (BID) | ORAL | Status: DC

## 2018-02-09 MED ORDER — FUROSEMIDE 10 MG/ML IJ SOLN: 40.0000 mg | Freq: Three times a day (TID) | INTRAMUSCULAR | Status: DC

## 2018-02-09 MED ORDER — HEPARIN BOLUS VIA INFUSION
4000.0000 [IU] | Freq: Once | INTRAVENOUS | Status: AC
  Administered 2018-02-10: 4000 [IU] via INTRAVENOUS
  Filled 2018-02-09: qty 4000

## 2018-02-09 MED ORDER — METOPROLOL TARTRATE 25 MG PO TABS: 12.5000 mg | ORAL_TABLET | Freq: Two times a day (BID) | ORAL | Status: DC

## 2018-02-09 MED ORDER — POTASSIUM CHLORIDE CRYS ER 20 MEQ PO TBCR
20.0000 meq | EXTENDED_RELEASE_TABLET | Freq: Once | ORAL | Status: AC
  Administered 2018-02-09: 20 meq via ORAL
  Filled 2018-02-09: qty 1

## 2018-02-09 MED ORDER — DILTIAZEM HCL-DEXTROSE 100-5 MG/100ML-% IV SOLN (PREMIX)
5.0000 mg/h | INTRAVENOUS | Status: DC
  Administered 2018-02-09: 5 mg/h via INTRAVENOUS
  Filled 2018-02-09 (×2): qty 100

## 2018-02-09 MED ORDER — DILTIAZEM LOAD VIA INFUSION
20.0000 mg | Freq: Once | INTRAVENOUS | Status: AC
  Administered 2018-02-09: 20 mg via INTRAVENOUS
  Filled 2018-02-09: qty 20

## 2018-02-09 NOTE — Progress Notes (Signed)
Patient continues to be tachycardic to 130s despite dilt drip and metoprolol.  Cardiology consulted and will see the patient.

## 2018-02-09 NOTE — Consult Note (Signed)
Cardiology Consultation   Patient ID: Paul [name]; [id number]; 07-31-1961   Admit date: 02/09/2018 Date of Consult: 02/09/2018  Referring Provider:  Wendee Beavers, MD  Primary Care Provider: Rogue Bussing, MD Cardiologist: Irish Lack Electrophysiologist:  NA  Reason for Consultation: atrial flutter  History of Present Illness: Paul [name] is a 57 y.o. male who is being seen today for the evaluation of atrial flutter w/ RVR at the request of Family Medicine service. Pt has h/o CAD, s/p PCI in 2016 to LAD and LCx, CKD stage3, h/o severe LVH and hyperdynamic LV function on TTE back in 2016 w/ question of possible infiltrative CM with no subsequent workup. He comes in this admission c/o dyspnea and bilateral leg edema for about a week. He gets SOB with minimal exertion and has to stop and rest for a few minutes in order to catch his breath. He also reports wheezing. EKG at PCP clinic was concerning for SVT and sent to ED. EKG here shows atrial flutter, 2:1 conduction.  Denies associated chest pain, recent illness, fever, chills or palpitation.  He has a baseline 1-2 pillow orthopnea.  He denies paroxysmal nocturnal dyspnea.  He reports history of bilateral hip pain.      Past Medical History:  Diagnosis Date  . Arthritis   . CAD (coronary artery disease)    a. 03/2015 NSTEMI/PCI: LM nl, LAD 95p (3.5x20 Promus DES), D1 60, D2/3 mild dzs, LCX 23m (plan for staged pci), OM1/2 min irregs, RCA 20p, RPDA 60.  Marland Kitchen CKD (chronic kidney disease), stage III ([place])   . GERD (gastroesophageal reflux disease)    occ  . HOCM (hypertrophic obstructive cardiomyopathy) ([place])    a. 03/2015 Echo: EF 65-60%, sev LV thickening w/ bright, speckled appearance of IV septum, Gr 2 DD, AoV sclerosis, triv AI, mild MR, LA 3ml/m^2, RA 10ml/m^2, aneurysmal IAS, mod TR, PASP 34mmHg.  Marland Kitchen Hypertension    denies  . PAF (paroxysmal atrial fibrillation) ([place])    remote isolated episode - 09/2012.  . TMJ  arthralgia     Past Surgical History:  Procedure Laterality Date  . APPENDECTOMY    . CARDIAC CATHETERIZATION N/A 03/19/2015   Procedure: Left Heart Cath and Coronary Angiography;  Surgeon: [name], MD;  Location: [place] [facility];  Service: Cardiovascular;  Laterality: N/A;  . CARDIAC CATHETERIZATION  03/19/2015   Procedure: Coronary Stent Intervention;  Surgeon: [name], MD;  Location: [place] [facility];  Service: Cardiovascular;;  . CARDIAC CATHETERIZATION N/A 04/09/2015   Procedure: Coronary Stent Intervention;  Surgeon: [name], MD;  Location: [place] [facility];  Service: Cardiovascular;  Laterality: N/A;  . HIP SURGERY Left 74   lft   . LAPAROSCOPIC APPENDECTOMY  09/22/2012   Procedure: APPENDECTOMY LAPAROSCOPIC;  Surgeon: Harl Bowie, MD;  Location: [facility];  Service: General;;  . LAPAROSCOPY  09/22/2012   Procedure: LAPAROSCOPY DIAGNOSTIC;  Surgeon: Harl Bowie, MD;  Location: [facility];  Service: General;  Laterality: N/A;  . TOTAL HIP ARTHROPLASTY Right 03,01   x2  . TOTAL HIP ARTHROPLASTY WITH HARDWARE REMOVAL Left 08/05/2014   Procedure: TOTAL HIP ARTHROPLASTY WITH HARDWARE REMOVAL;  Surgeon: Kerin Salen, MD;  Location: [facility];  Service: Orthopedics;  Laterality: Left;      Current Medications: . [START ON 02/10/2018] atorvastatin  40 mg Oral q1800    Current Facility-Administered Medications:  .  [START ON 02/10/2018] atorvastatin (LIPITOR) tablet 40 mg, 40 mg, Oral, q1800,  Mercy Riding, MD .  Margrett Rud diltiazem (CARDIZEM) 1 mg/mL load via infusion 20 mg, 20 mg, Intravenous, Once, 20 mg at 02/09/18 1426 **AND** diltiazem (CARDIZEM) 100 mg in dextrose 5% 115mL (1 mg/mL) infusion, 5-15 mg/hr, Intravenous, Continuous, Virgel Manifold, MD, Last Rate: 15 mL/hr at 02/09/18 2018, 15 mg/hr at 02/09/18 2018  Current Outpatient Medications:  .  sildenafil (VIAGRA) 100 MG tablet, Take 1 tablet (100 mg total) by mouth daily as needed for erectile  dysfunction. 30 min-4 hrs before sexual activity. Do not use with nitro., Disp: 90 tablet, Rfl: 0 .  oxyCODONE-acetaminophen (PERCOCET/ROXICET) 5-325 MG tablet, Take 1 tablet by mouth as needed., Disp: , Rfl:   Infused Medications: . diltiazem (CARDIZEM) infusion 15 mg/hr (02/09/18 2018)    PRN Medications:    Allergies:    Allergies  Allergen Reactions  . Ibuprofen Other (See Comments)    Kidney damage  . Nsaids     REACTION: nsaid induced nephropathy    Social History:   The patient  reports that he has been smoking cigarettes.  He has a 2.50 pack-year smoking history. He has never used smokeless tobacco. He reports that he drinks about 1.2 oz of alcohol per week. He reports that he does not use drugs.    Family History:   The patient's family history includes Heart disease in his father and mother.   ROS:  Please see the history of present illness.  All other ROS reviewed and negative.     Vital Signs: Blood pressure (!) 132/99, pulse (!) 130, temperature 98.6 F (37 C), temperature source Oral, resp. rate (!) 32, SpO2 97 %.   PHYSICAL EXAM: General:  Well nourished, well developed, in no acute distress HEENT: normal Lymph: no adenopathy Neck: ++ JVD Endocrine:  No thryomegaly Vascular: No carotid bruits; DP pulses 2+ bilaterally   Cardiac:  normal S1, S2; RRR, tachy; no murmur  Lungs:  clear to auscultation bilaterally anteriorly, no wheezing, rhonchi or rales  Abd: soft, nontender, no hepatomegaly  Ext: 1-2+ pitting edema bilaterally Musculoskeletal:  No deformities, BUE and BLE strength normal and equal Skin: warm and dry  Neuro:  CNs 2-12 intact, no focal abnormalities noted Psych:  Normal affect   EKG:  02-09-18 atrial flutter, typical, 2:1, HR 133  Labs: No results for input(s): CKTOTAL, CKMB, TROPONINI in the last 72 hours. Recent Labs    02/09/18 1412  TROPIPOC 0.06    Lab Results  Component Value Date   WBC 6.6 02/09/2018   HGB 11.2 (L) 02/09/2018     HCT 36.3 (L) 02/09/2018   MCV 78.9 02/09/2018   PLT 196 02/09/2018   Recent Labs  Lab 02/09/18 1351  NA 144  K 4.6  CL 113*  CO2 23  BUN 29*  CREATININE 1.96*  CALCIUM 8.6*  GLUCOSE 96   Lab Results  Component Value Date   CHOL 159 07/13/2017   HDL 56 07/13/2017   LDLCALC 89 07/13/2017   TRIG 72 07/13/2017   No results found for: DDIMER  Radiology/Studies:  Dg Chest 2 View  Result Date: 02/09/2018 CLINICAL DATA:  Shortness of breath and wheezing for several days. Smoker. Coronary artery disease. Chronic kidney disease stage 3. EXAM: CHEST - 2 VIEW COMPARISON:  03/18/2015 FINDINGS: Stable mild cardiomegaly. Mild diffuse interstitial infiltrates and Kerley B-lines are seen, consistent with mild interstitial edema. No evidence of pulmonary consolidation. Tiny posterior pleural thickening or effusion is again seen bilaterally, without significant change. IMPRESSION: Mild diffuse interstitial  infiltrates, suspicious for interstitial edema/congestive heart failure. Mild cardiomegaly. Electronically Signed   By: Earle Gell M.D.   On: 02/09/2018 13:24   03-18-15 TTE Left ventricle: Severe LV wall thickening with basal septal   hypertrophy - measuring 2.0 cm. There is a bright, speckled   appearance of the intraventricular septum which may be consistent   with infiltrative disease such as amyloidosis. No LVOT   obstruction is noted. Systolic function was vigorous. The   estimated ejection fraction was in the range of 65% to 70%. Wall   motion was normal; there were no regional wall motion   abnormalities. Doppler parameters are consistent with pseudormal   left ventricular relaxation (grade 2 diastolic dysfunction). The   E/A ratio is 2.5. The E/e&' ratio is >15, suggesting elevated LV   filling pressure. - Aortic valve: Sclerosis without stenosis. Transvalvular velocity   was minimally increased. There was trivial regurgitation. Valve   area (VTI): 3.14 cm^2. Valve area (Vmax):  3.11 cm^2. Valve area   (Vmean): 3.21 cm^2. - Mitral valve: There was mild regurgitation. - Left atrium: Massively dilated at 77 ml/m2. - Right atrium: Massively dilated at 48 ml/m2. - Atrial septum: Aneurysmal IAS - PFO cannot be excluded. - Tricuspid valve: There was moderate regurgitation. - Pulmonary arteries: PA peak pressure: 56 mm Hg (S). - Inferior vena cava: The vessel was dilated. The respirophasic   diameter changes were blunted (< 50%), consistent with elevated   central venous pressure.   03-19-15 LHC  Prox RCA lesion, 20% stenosed.  RPDA lesion, 60% stenosed.  Mid Cx lesion, 90% stenosed.  Ost 1st Diag to 1st Diag lesion, 60% stenosed.  Ost LAD to Prox LAD lesion, 95% stenosed. There is a 0% residual stenosis post intervention.  A drug-eluting stent was placed.   Final Conclusions:  1. Severe two-vessel coronary artery disease. 2. Moderately elevated left ventricular end-diastolic pressure. 3. Successful angioplasty and drug-eluting stent placement to the proximal LAD. *04-09-15 Staged PCI to mLCx was performed  ASSESSMENT AND PLAN:  1. Atrial flutter: typical, 2:1. Not responding to dilt gtt. He got 12.$RemoveB'5mg'[id number]$  metoprolol which did not significantly affect his rate. He is HD stable; no need for urgent DCCV. Would give metoprolol $RemoveBeforeD'50mg'[id number]$  x 1 and see how rate responds. Can continue q6h if effective. OK to cont CCB as negative inotrope also overnight. Best plan to deal with flutter will be for DCCV vs ablation. Will need TEE prior given not on A/C. Would add heparin IV gtt. Not sure if good candidate for ablation given significant biatrial enlargement. Will have EP consult in the AM.  2. Volume overload/HFpEF: TTE in 2016 showed significant LVH w/ preserved/hyperdynamic LVEF, severe biatrial enlargement with suggestion of possible infiltrative CM; this current clinical presentation may be indicative of restrictive CM. Would go ahead w/ repeat TTE as ordered; consider cMRI for  further evaluation as well. Cont to treat volume w/ IV diuretics as tolerated.   3. CAD: h/o PCI to LAD and LCx. Trop unremarkable. Pt not on any medical tx; needs chronic ASA, BB, statin at minimum. BB may actually be very helpful for him if he has a restrictive physiology (impaired diastolic filling due to infiltrative CM); his atrial flutter is exacerbating this problem and decreasing filling time even more than normal, thus likely leading to this decompensated picture. Last LDL 89; goal<70. Would recommend crestor 40 or atorva 80  4. HTN: BP controlled for now. Can treat PRN   Signed, Rudean Curt, MD  02/09/2018 10:27 PM

## 2018-02-09 NOTE — ED Provider Notes (Signed)
[facility] EMERGENCY DEPARTMENT Provider Note   CSN: [id number] Arrival date & time: 02/09/18  1249     History   Chief Complaint Chief Complaint  Patient presents with  . Chest Pain  . Shortness of Breath    HPI Paul [name] is a 57 y.o. male.  HPI   57 year old with dyspnea.  Patient is not sure of the exact timeline but progressively worsening over at least the last week or so.  Progressive lower extremity edema.  Does improve somewhat with elevation.  Denies any chest pain.  No fevers or chills.  He was evaluated in the clinic prior to arrival.  Noted to have a regular narrow complex tachycardia with a rate around 130 and referred to the emergency room.  Past Medical History:  Diagnosis Date  . Arthritis   . CAD (coronary artery disease)    a. 03/2015 NSTEMI/PCI: LM nl, LAD 95p (3.5x20 Promus DES), D1 60, D2/3 mild dzs, LCX 72m(plan for staged pci), OM1/2 min irregs, RCA 20p, RPDA 60.  .Marland KitchenCKD (chronic kidney disease), stage III ([place]   . GERD (gastroesophageal reflux disease)    occ  . HOCM (hypertrophic obstructive cardiomyopathy) ([facility]    a. 03/2015 Echo: EF 65-60%, sev LV thickening w/ bright, speckled appearance of IV septum, Gr 2 DD, AoV sclerosis, triv AI, mild MR, LA 768mm^2, RA 484m^2, aneurysmal IAS, mod TR, PASP 4m4m  . HyMarland Kitchenertension    denies  . PAF (paroxysmal atrial fibrillation) (HCC)[place] isolated episode - 09/2012.  . TMJ arthralgia     Patient Active Problem List   Diagnosis Date Noted  . Shortness of breath 02/09/2018  . Chronic kidney disease (CKD), stage IV (severe) (HCC)[place]/07/2017  . Chronic pain syndrome 05/31/2016  . Osteoarthritis, knee 05/31/2016  . Chronic renal insufficiency, stage III (moderate) (HCC)[place]/12/2014  . CAD - S/P LAD DES May 2016, staged CFX DES June 2016 04/08/2015  . Chronic diastolic CHF (congestive heart failure) (HCC)[place]/19/2016  . Non-ST elevation myocardial infarction (NSTEMI),  subsequent episode of care (HCC)[place]/19/2016  . Stented coronary artery   . Microcytic anemia 03/19/2015  . 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 Hypomagnesemia 03/19/2015  . Leukocytosis 03/19/2015  . NSTEMI (non-ST elevated myocardial infarction) (HCC)[place]/08/2015  . Acute diastolic CHF (congestive heart failure), NYHA class 4 (HCC) 03/18/2015    Class: Acute  . Essential hypertension 03/18/2015  . Lumbar strain 11/12/2014  . Arthritis of left hip 08/05/2014  . 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 Preventative health care 02/06/2013  . Sinus tachycardia 02/06/2013  . Right hand pain 12/27/2012  . History of repair of hip joint 12/25/2012  . 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 Hypertensive cardiomyopathy (HCC)[place]/17/2013  . Bilateral hip pain 09/05/2012  . Other, mixed, or unspecified nondependent drug abuse, unspecified 12/03/2008  . Hypertrophic obstructive cardiomyopathy (HCC)[place]/30/2009  . ERECTILE DYSFUNCTION 04/23/2008  . GERD 02/21/2008  . PAF- last in 2013 02/17/2008  . BACK PAIN 10/11/2007  . Hyperlipidemia 05/05/2007  . TOBACCO USE 03/31/2007  . RHINITIS, ALLERGIC NOS 03/31/2007  . Renal failure 01/05/2007    Past Surgical History:  Procedure Laterality Date  . APPENDECTOMY    . CARDIAC CATHETERIZATION N/A 03/19/2015   Procedure: Left Heart Cath and Coronary Angiography;  Surgeon: MuhaWellington Hampshire;  [facility];  Service: Cardiovascular;  Laterality: N/A;  .  CARDIAC CATHETERIZATION  03/19/2015   Procedure: Coronary Stent Intervention;  Surgeon: [name], MD;  Location: [facility];  Service: Cardiovascular;;  . CARDIAC CATHETERIZATION N/A 04/09/2015   Procedure: Coronary Stent Intervention;  Surgeon: [name], MD;  Location: [facility];  Service: Cardiovascular;  Laterality: N/A;  . HIP SURGERY Left 74   lft   . LAPAROSCOPIC APPENDECTOMY   09/22/2012   Procedure: APPENDECTOMY LAPAROSCOPIC;  Surgeon: Harl Bowie, MD;  Location: [facility];  Service: General;;  . LAPAROSCOPY  09/22/2012   Procedure: LAPAROSCOPY DIAGNOSTIC;  Surgeon: Harl Bowie, MD;  Location: [facility];  Service: General;  Laterality: N/A;  . TOTAL HIP ARTHROPLASTY Right 03,01   x2  . TOTAL HIP ARTHROPLASTY WITH HARDWARE REMOVAL Left 08/05/2014   Procedure: TOTAL HIP ARTHROPLASTY WITH HARDWARE REMOVAL;  Surgeon: Kerin Salen, MD;  Location: [facility];  Service: Orthopedics;  Laterality: Left;        Home Medications    Prior to Admission medications   Medication Sig Start Date End Date Taking? Authorizing Provider  cetirizine (ZYRTEC) 10 MG tablet Take 1 tablet (10 mg total) by mouth daily. 07/13/17   Rogue Bussing, MD  sildenafil (VIAGRA) 100 MG tablet Take 1 tablet (100 mg total) by mouth daily as needed for erectile dysfunction. 30 min-4 hrs before sexual activity. Do not use with nitro. 10/21/17   Rogue Bussing, MD    Family History Family History  Problem Relation Age of Onset  . Heart disease Mother   . Heart disease Father     Social History Social History   Tobacco Use  . Smoking status: Light Tobacco Smoker    Packs/day: 0.25    Years: 10.00    Pack years: 2.50    Types: Cigarettes  . Smokeless tobacco: Never Used  Substance Use Topics  . Alcohol use: Yes    Alcohol/week: 1.2 oz    Types: 2 Cans of beer per week    Comment: occasional  . Drug use: No     Allergies   Ibuprofen and Nsaids   Review of Systems Review of Systems  All systems reviewed and negative, other than as noted in HPI.  Physical Exam Updated Vital Signs BP (!) 144/98   Pulse (!) 130   Temp 97.8 F (36.6 C) (Oral)   Resp (!) 27   SpO2 98%   Physical Exam  Constitutional: He appears well-developed and well-nourished. No distress.  HENT:  Head: Normocephalic and atraumatic.  JVD  Eyes: Conjunctivae are normal. Right eye  exhibits no discharge. Left eye exhibits no discharge.  Neck: Neck supple.  Cardiovascular: Regular rhythm and normal heart sounds. Exam reveals no gallop and no friction rub.  No murmur heard. Regular tachycardia  Pulmonary/Chest: No respiratory distress.  Mild tachypnea.  No accessory muscle usage.  Crackles right base.  Abdominal: Soft. He exhibits no distension. There is no tenderness.  Musculoskeletal: He exhibits edema. He exhibits no tenderness.  Symmetric pitting lower extremity edema.  Neurological: He is alert.  Skin: Skin is warm and dry.  Psychiatric: He has a normal mood and affect. His behavior is normal. Thought content normal.  Nursing note and vitals reviewed.    ED Treatments / Results  Labs (all labs ordered are listed, but only abnormal results are displayed) Labs Reviewed  BLOOD CULTURE ID PANEL (REFLEXED) - Abnormal; Notable for the following components:      Result Value   Staphylococcus  species DETECTED (*)    All other components within normal limits  BASIC METABOLIC PANEL - Abnormal; Notable for the following components:   Chloride 113 (*)    BUN 29 (*)    Creatinine, Ser 1.96 (*)    Calcium 8.6 (*)    GFR calc non Af Amer 36 (*)    GFR calc Af Amer 41 (*)    All other components within normal limits  CBC - Abnormal; Notable for the following components:   Hemoglobin 11.2 (*)    HCT 36.3 (*)    MCH 24.3 (*)    All other components within normal limits  BRAIN NATRIURETIC PEPTIDE - Abnormal; Notable for the following components:   B Natriuretic Peptide 1,737.7 (*)    All other components within normal limits  TROPONIN I - Abnormal; Notable for the following components:   Troponin I 0.14 (*)    All other components within normal limits  TROPONIN I - Abnormal; Notable for the following components:   Troponin I 0.19 (*)    All other components within normal limits  COMPREHENSIVE METABOLIC PANEL - Abnormal; Notable for the following components:    CO2 16 (*)    BUN 33 (*)    Creatinine, Ser 2.61 (*)    Calcium 8.0 (*)    Total Protein 5.7 (*)    Albumin 3.2 (*)    AST 367 (*)    ALT 281 (*)    Total Bilirubin 3.3 (*)    GFR calc non Af Amer 25 (*)    GFR calc Af Amer 29 (*)    All other components within normal limits  CBC - Abnormal; Notable for the following components:   Hemoglobin 11.9 (*)    HCT 37.6 (*)    MCH 24.9 (*)    All other components within normal limits  HEPARIN LEVEL (UNFRACTIONATED) - Abnormal; Notable for the following components:   Heparin Unfractionated 0.26 (*)    All other components within normal limits  TROPONIN I - Abnormal; Notable for the following components:   Troponin I 0.11 (*)    All other components within normal limits  LACTIC ACID, PLASMA - Abnormal; Notable for the following components:   Lactic Acid, Venous 2.2 (*)    All other components within normal limits  TROPONIN I - Abnormal; Notable for the following components:   Troponin I 0.15 (*)    All other components within normal limits  TROPONIN I - Abnormal; Notable for the following components:   Troponin I 0.22 (*)    All other components within normal limits  LACTIC ACID, PLASMA - Abnormal; Notable for the following components:   Lactic Acid, Venous 5.1 (*)    All other components within normal limits  GLUCOSE, CAPILLARY - Abnormal; Notable for the following components:   Glucose-Capillary 114 (*)    All other components within normal limits  TROPONIN I - Abnormal; Notable for the following components:   Troponin I 0.19 (*)    All other components within normal limits  TROPONIN I - Abnormal; Notable for the following components:   Troponin I 0.25 (*)    All other components within normal limits  LACTIC ACID, PLASMA - Abnormal; Notable for the following components:   Lactic Acid, Venous 2.4 (*)    All other components within normal limits  COMPREHENSIVE METABOLIC PANEL - Abnormal; Notable for the following components:    Potassium 5.7 (*)    Chloride 112 (*)  CO2 16 (*)    BUN 37 (*)    Creatinine, Ser 2.95 (*)    Calcium 8.3 (*)    Total Protein 6.1 (*)    Albumin 3.4 (*)    AST 598 (*)    ALT 536 (*)    Total Bilirubin 3.4 (*)    GFR calc non Af Amer 22 (*)    GFR calc Af Amer 25 (*)    All other components within normal limits  CBC WITH DIFFERENTIAL/PLATELET - Abnormal; Notable for the following components:   Hemoglobin 12.1 (*)    MCH 24.3 (*)    All other components within normal limits  BLOOD GAS, ARTERIAL - Abnormal; Notable for the following components:   pH, Arterial 7.290 (*)    pO2, Arterial 146 (*)    Bicarbonate 16.7 (*)    Acid-base deficit 8.7 (*)    All other components within normal limits  HEPARIN LEVEL (UNFRACTIONATED) - Abnormal; Notable for the following components:   Heparin Unfractionated 0.18 (*)    All other components within normal limits  CBC - Abnormal; Notable for the following components:   WBC 11.0 (*)    Hemoglobin 11.6 (*)    HCT 36.5 (*)    MCH 25.2 (*)    All other components within normal limits  PHOSPHORUS - Abnormal; Notable for the following components:   Phosphorus 6.0 (*)    All other components within normal limits  BASIC METABOLIC PANEL - Abnormal; Notable for the following components:   Potassium 5.6 (*)    CO2 19 (*)    Glucose, Bld 102 (*)    BUN 44 (*)    Creatinine, Ser 3.44 (*)    Calcium 8.3 (*)    GFR calc non Af Amer 18 (*)    GFR calc Af Amer 21 (*)    Anion gap 17 (*)    All other components within normal limits  RENAL FUNCTION PANEL - Abnormal; Notable for the following components:   Potassium 5.5 (*)    CO2 19 (*)    Glucose, Bld 120 (*)    BUN 43 (*)    Creatinine, Ser 3.39 (*)    Calcium 8.2 (*)    Phosphorus 6.0 (*)    Albumin 3.3 (*)    GFR calc non Af Amer 18 (*)    GFR calc Af Amer 21 (*)    Anion gap 17 (*)    All other components within normal limits  HEPARIN LEVEL (UNFRACTIONATED) - Abnormal; Notable for the  following components:   Heparin Unfractionated >2.20 (*)    All other components within normal limits  HEPATIC FUNCTION PANEL - Abnormal; Notable for the following components:   Total Protein 6.3 (*)    AST 1,215 (*)    ALT 1,111 (*)    Total Bilirubin 3.4 (*)    Bilirubin, Direct 0.8 (*)    Indirect Bilirubin 2.6 (*)    All other components within normal limits  CBC WITH DIFFERENTIAL/PLATELET - Abnormal; Notable for the following components:   Hemoglobin 11.9 (*)    HCT 38.6 (*)    MCH 24.7 (*)    Neutro Abs 8.3 (*)    All other components within normal limits  BLOOD GAS, ARTERIAL - Abnormal; Notable for the following components:   pH, Arterial 7.294 (*)    Bicarbonate 17.7 (*)    Acid-base deficit 7.6 (*)    All other components within normal limits  URINALYSIS, ROUTINE W  REFLEX MICROSCOPIC - Abnormal; Notable for the following components:   Color, Urine STRAW (*)    All other components within normal limits  LACTIC ACID, PLASMA - Abnormal; Notable for the following components:   Lactic Acid, Venous 4.3 (*)    All other components within normal limits  LACTIC ACID, PLASMA - Abnormal; Notable for the following components:   Lactic Acid, Venous 2.9 (*)    All other components within normal limits  LACTIC ACID, PLASMA - Abnormal; Notable for the following components:   Lactic Acid, Venous 2.1 (*)    All other components within normal limits  LACTIC ACID, PLASMA - Abnormal; Notable for the following components:   Lactic Acid, Venous 2.0 (*)    All other components within normal limits  BLOOD GAS, ARTERIAL - Abnormal; Notable for the following components:   pH, Arterial 7.309 (*)    Acid-base deficit 4.2 (*)    All other components within normal limits  GLUCOSE, CAPILLARY - Abnormal; Notable for the following components:   Glucose-Capillary 100 (*)    All other components within normal limits  CBC - Abnormal; Notable for the following components:   Hemoglobin 11.0 (*)     HCT 35.0 (*)    MCV 77.8 (*)    MCH 24.4 (*)    All other components within normal limits  BASIC METABOLIC PANEL - Abnormal; Notable for the following components:   Glucose, Bld 120 (*)    BUN 51 (*)    Creatinine, Ser 3.63 (*)    Calcium 8.0 (*)    GFR calc non Af Amer 17 (*)    GFR calc Af Amer 20 (*)    All other components within normal limits  CBG MONITORING, ED - Abnormal; Notable for the following components:   Glucose-Capillary 115 (*)    All other components within normal limits  MRSA PCR SCREENING  CULTURE, BLOOD (ROUTINE X 2)  CULTURE, BLOOD (ROUTINE X 2)  CULTURE, BLOOD (ROUTINE X 2)  CULTURE, BLOOD (ROUTINE X 2)  TSH  MAGNESIUM  HIV ANTIBODY (ROUTINE TESTING)  TSH  HEMOGLOBIN A1C  RAPID URINE DRUG SCREEN, HOSP PERFORMED  PROCALCITONIN  HEPARIN LEVEL (UNFRACTIONATED)  LIPASE, BLOOD  AMYLASE  MAGNESIUM  PROCALCITONIN  MAGNESIUM  CREATININE, URINE, RANDOM  SODIUM, URINE, RANDOM  HEPARIN LEVEL (UNFRACTIONATED)  HEPARIN LEVEL (UNFRACTIONATED)  HEPARIN LEVEL (UNFRACTIONATED)  PROCALCITONIN  MAGNESIUM  LACTIC ACID, PLASMA  HEPATITIS PANEL, ACUTE  MULTIPLE MYELOMA PANEL, SERUM  BETA 2 MICROGLOBULIN, SERUM  UPEP/UIFE/LIGHT CHAINS/TP, 24-HR UR  I-STAT TROPONIN, ED    EKG None  Radiology Dg Chest 2 View  Result Date: 02/09/2018 CLINICAL DATA:  Shortness of breath and wheezing for several days. Smoker. Coronary artery disease. Chronic kidney disease stage 3. EXAM: CHEST - 2 VIEW COMPARISON:  03/18/2015 FINDINGS: Stable mild cardiomegaly. Mild diffuse interstitial infiltrates and Kerley B-lines are seen, consistent with mild interstitial edema. No evidence of pulmonary consolidation. Tiny posterior pleural thickening or effusion is again seen bilaterally, without significant change. IMPRESSION: Mild diffuse interstitial infiltrates, suspicious for interstitial edema/congestive heart failure. Mild cardiomegaly. Electronically Signed   By: Earle Gell M.D.   On:  02/09/2018 13:24    Procedures Procedures (including critical care time)  CRITICAL CARE Performed by: Virgel Manifold Total critical care time: 35 minutes Critical care time was exclusive of separately billable procedures and treating other patients. Critical care was necessary to treat or prevent imminent or life-threatening deterioration. Critical care was time spent personally by me on  the following activities: development of treatment plan with patient and/or surrogate as well as nursing, discussions with consultants, evaluation of patient's response to treatment, examination of patient, obtaining history from patient or surrogate, ordering and performing treatments and interventions, ordering and review of laboratory studies, ordering and review of radiographic studies, pulse oximetry and re-evaluation of patient's condition.   Medications Ordered in ED Medications  diltiazem (CARDIZEM) 1 mg/mL load via infusion 20 mg (20 mg Intravenous Bolus from Bag 02/09/18 1426)    And  diltiazem (CARDIZEM) 100 mg in dextrose 5% 140m (1 mg/mL) infusion (10 mg/hr Intravenous Rate/Dose Change 02/09/18 1509)  adenosine (ADENOCARD) 6 MG/2ML injection 6 mg (has no administration in time range)  furosemide (LASIX) injection 40 mg (40 mg Intravenous Given 02/09/18 1525)  potassium chloride SA (K-DUR,KLOR-CON) CR tablet 20 mEq (20 mEq Oral Given 02/09/18 1525)     Initial Impression / Assessment and Plan / ED Course  I have reviewed the triage vital signs and the nursing notes.  Pertinent labs & imaging results that were available during my care of the patient were reviewed by me and considered in my medical decision making (see chart for details).     [age]year old male with tachycardia and progressive dyspnea.   EKG showing what I suspect is atrial flutter with 2-1 conduction although rate is a little bit slow for this. Cardizem ordered which is reasonable treatment of possible SVT as well.  He is volume  overloaded.  Pitting edema of lower extremities.  JVD.  BNP is elevated.  Edema on chest x-ray.  Final Clinical Impressions(s) / ED Diagnoses   Final diagnoses:  Respiratory failure ([facility]  Elevated LFTs  Acute respiratory failure ([place]  Abdominal pain  Epigastric abdominal pain    ED Discharge Orders    None       KVirgel Manifold MD 02/12/18 [phone]

## 2018-02-09 NOTE — ED Notes (Signed)
Email sent for cardiac diet tray

## 2018-02-09 NOTE — H&P (Signed)
[facility] Admission History and Physical Service Pager: [phone]  Patient name: Paul [name] Medical record number: [id number] Date of birth: [birth year] Age: 57 y.o. Gender: male  Primary Care Provider: Rogue Bussing, MD Consultants: cardiology Code Status: Full (obtained on admission)  Chief Complaint: Shortness of breath and edema  Assessment and Plan: Paul [name] is a 57 y.o. male presenting with shortness of breath and edema. PMH is significant for the CHF, CAD s/p LAD DES in 5/ 2016, hypertension, PAF, hyperlipidemia, CKD 4, tobacco use and arthritis  Dyspnea/edema: likely due to acute CHF exacerbation +/-atrial flutter..  Patient with history of diastolic heart failure.  Patient is not on diuretics.  On exam, patient with significant JVD, 2+ pitting edema bilaterally and some bibasilar crackles.  Surprisingly satting well on room air. BNP elevated to 1800. Doubt PE. Wells' score 1.5 for tachycardia which could be due to his atrial flutter. No chest pain or hypoxia. Doubt ACS.  Initial EKG with atrial flutter but no acute ischemic finding but PRWP.  Troponin mildly elevated to 0.06 likely demand.  Started on IV Lasix with good response. - Admit to telemetry.  Attending Dr. Erin Hearing.  - Continue cycling troponin to rule out ACS. - Diuresis with lasix cautiously.  Concerning for right-sided heart failure given  - Daily weights, strict I/O - Check electrolytes, mag - 2D echocardiogram - GDMT when HDS stable - Start statin.  Atrial flutter with RVR: Patient with history of paroxysmal atrial fibrillation. [place] Vasc score 3.  Not on rate or rhythm control.  Not on anticoagulation.  Currently on diltiazem drip.  Heart rate in 130s consistently.  Discussed patient with Dr. Debara Pickett who suggested low-dose oral metoprolol -Metoprolol 12.5 mg twice daily -Heparin drip  CAD s/p LAD DES in 03/2015.  Currently without chest pain.  Dyspnea likely  due to above.  Not on BB, statin or antiplatelet. -BB as above -Start atorvastatin -Start aspirin -Cycle troponin and EKG to rule out ACS   Hypertension: Currently slightly hypertensive -Continue metoprolol Lasix as above  Hyperlipidemia:  -Lipid panel -Statin as above  CKD 4: baseline creatinine 2.0 -Continue checking renal function  Tobacco use disorder: reports smoking a pack a week -Encouraged to quit -Offer nicotine patch  FEN/GI:  -Heart healthy diet  Prophylaxis: On heparin for atrial flutter/A. fib  Disposition: Admit to stepdown  History of Present Illness:  Paul [name] is a 56 y.o. male presenting with dyspnea and bilateral leg edema  Patient presented to clinic with dyspnea and bilateral leg edema for about a week.  He also reports wheezing. EKG concerning for SVT and sent to ED.  He has history of CAD, CHF and atrial fibrillation.  He is not on any medication for this.  Not seen by cardiologist in over a year.  Denies chest pain, recent illness, fever, chills or palpitation.  He has a baseline 1-2 pillow orthopnea.  He denies paroxysmal nocturnal dyspnea.  He reports history of bilateral hip pain.  He says he has been out of his Percocet.  He has been using Advil.  He does not watch his salt intake.  He says he is followed by cardiologist across from the [place].   Reports smoking about 1 pack a week.  Reports occasional alcohol.  Denies recreational drug use.  Review Of Systems:  Review of Systems  Constitutional: Negative for chills, fever and weight loss.  HENT: Negative for congestion and sore throat.  Eyes: Negative for blurred vision.  Respiratory: Positive for shortness of breath and wheezing. Negative for cough.   Cardiovascular: Positive for leg swelling. Negative for chest pain, palpitations, orthopnea and PND.  Gastrointestinal: Negative for abdominal pain, blood in stool, diarrhea, melena, nausea and vomiting.  Genitourinary: Negative for  dysuria and hematuria.  Musculoskeletal: Positive for joint pain. Negative for myalgias.  Skin: Negative for rash.  Neurological: Negative for speech change, focal weakness, weakness and headaches.  Endo/Heme/Allergies: Does not bruise/bleed easily.  Psychiatric/Behavioral: Negative for depression and substance abuse. The patient is not nervous/anxious.     Patient Active Problem List   Diagnosis Date Noted  . Shortness of breath 02/09/2018  . CHF exacerbation ([place]) 02/09/2018  . Chronic kidney disease (CKD), stage IV (severe) ([place]) 07/17/2017  . Chronic pain syndrome 05/31/2016  . Osteoarthritis, knee 05/31/2016  . Chronic renal insufficiency, stage III (moderate) ([place]) 04/10/2015  . CAD - S/P LAD DES May 2016, staged CFX DES June 2016 04/08/2015  . Chronic diastolic CHF (congestive heart failure) ([place]) 03/27/2015  . Non-ST elevation myocardial infarction (NSTEMI), subsequent episode of care ([place]) 03/27/2015  . Stented coronary artery   . Microcytic anemia 03/19/2015  . 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 Hypomagnesemia 03/19/2015  . Leukocytosis 03/19/2015  . NSTEMI (non-ST elevated myocardial infarction) ([place]) 03/18/2015  . Acute diastolic CHF (congestive heart failure), NYHA class 4 (HCC) 03/18/2015    Class: Acute  . Essential hypertension 03/18/2015  . Lumbar strain 11/12/2014  . Arthritis of left hip 08/05/2014  . 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 Preventative health care 02/06/2013  . Sinus tachycardia 02/06/2013  . Right hand pain 12/27/2012  . History of repair of hip joint 12/25/2012  . 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 Hypertensive cardiomyopathy ([place]) 09/24/2012  . Bilateral hip pain 09/05/2012  . Other, mixed, or unspecified nondependent drug abuse, unspecified 12/03/2008  . Hypertrophic obstructive cardiomyopathy ([place]) 08/07/2008  . ERECTILE DYSFUNCTION 04/23/2008  . GERD  02/21/2008  . PAF- last in 2013 02/17/2008  . BACK PAIN 10/11/2007  . Hyperlipidemia 05/05/2007  . TOBACCO USE 03/31/2007  . RHINITIS, ALLERGIC NOS 03/31/2007  . Renal failure 01/05/2007    Past Medical History: Past Medical History:  Diagnosis Date  . Arthritis   . CAD (coronary artery disease)    a. 03/2015 NSTEMI/PCI: LM nl, LAD 95p (3.5x20 Promus DES), D1 60, D2/3 mild dzs, LCX 36m (plan for staged pci), OM1/2 min irregs, RCA 20p, RPDA 60.  Marland Kitchen CKD (chronic kidney disease), stage III ([place])   . GERD (gastroesophageal reflux disease)    occ  . HOCM (hypertrophic obstructive cardiomyopathy) ([place])    a. 03/2015 Echo: EF 65-60%, sev LV thickening w/ bright, speckled appearance of IV septum, Gr 2 DD, AoV sclerosis, triv AI, mild MR, LA 57ml/m^2, RA 57ml/m^2, aneurysmal IAS, mod TR, PASP 90mmHg.  Marland Kitchen Hypertension    denies  . PAF (paroxysmal atrial fibrillation) ([place])    remote isolated episode - 09/2012.  . TMJ arthralgia     Past Surgical History: Past Surgical History:  Procedure Laterality Date  . APPENDECTOMY    . CARDIAC CATHETERIZATION N/A 03/19/2015   Procedure: Left Heart Cath and Coronary Angiography;  Surgeon: [name], MD;  Location: [place] [facility];  Service: Cardiovascular;  Laterality: N/A;  . CARDIAC CATHETERIZATION  03/19/2015   Procedure: Coronary Stent Intervention;  Surgeon: [name], MD;  Location: [place]  [facility];  Service: Cardiovascular;;  . CARDIAC CATHETERIZATION N/A 04/09/2015   Procedure: Coronary Stent Intervention;  Surgeon: [name], MD;  Location: [place] [facility];  Service: Cardiovascular;  Laterality: N/A;  . HIP SURGERY Left 74   lft   . LAPAROSCOPIC APPENDECTOMY  09/22/2012   Procedure: APPENDECTOMY LAPAROSCOPIC;  Surgeon: Harl Bowie, MD;  Location: [facility];  Service: General;;  . LAPAROSCOPY  09/22/2012   Procedure: LAPAROSCOPY DIAGNOSTIC;  Surgeon: Harl Bowie, MD;  Location: [facility];  Service: General;   Laterality: N/A;  . TOTAL HIP ARTHROPLASTY Right 03,01   x2  . TOTAL HIP ARTHROPLASTY WITH HARDWARE REMOVAL Left 08/05/2014   Procedure: TOTAL HIP ARTHROPLASTY WITH HARDWARE REMOVAL;  Surgeon: Kerin Salen, MD;  Location: [place];  Service: Orthopedics;  Laterality: Left;    Social History: Social History   Tobacco Use  . Smoking status: Light Tobacco Smoker    Packs/day: 0.25    Years: 10.00    Pack years: 2.50    Types: Cigarettes  . Smokeless tobacco: Never Used  Substance Use Topics  . Alcohol use: Yes    Alcohol/week: 1.2 oz    Types: 2 Cans of beer per week    Comment: occasional  . Drug use: No   Additional social history: as above  Please also refer to relevant sections of EMR.  Family History: Family History  Problem Relation Age of Onset  . Heart disease Mother   . Heart disease Father    (If not completed, MUST add something in)  Allergies and Medications: Allergies  Allergen Reactions  . Ibuprofen Other (See Comments)    Kidney damage  . Nsaids     REACTION: nsaid induced nephropathy   No current facility-administered medications on file prior to encounter.    Current Outpatient Medications on File Prior to Encounter  Medication Sig Dispense Refill  . sildenafil (VIAGRA) 100 MG tablet Take 1 tablet (100 mg total) by mouth daily as needed for erectile dysfunction. 30 min-4 hrs before sexual activity. Do not use with nitro. 90 tablet 0  . oxyCODONE-acetaminophen (PERCOCET/ROXICET) 5-325 MG tablet Take 1 tablet by mouth as needed.      Objective: BP (!) 130/101 (BP Location: Left Arm)   Pulse (!) 132   Temp 98.6 F (37 C) (Oral)   Resp 20   SpO2 94%  Exam: GEN: appears well lying in bed. No apparent distress. Head: normocephalic and atraumatic  Eyes: conjunctiva without injection. Sclera anicteric. Oropharynx: MMM. No erythema. No exudation or petechiae.  HEM: negative for cervical or periauricular lymphadenopathies CVS: Tachycardic to 130s,  regular, nl s1 & s2, no murmurs, 2+ pitting edema to his knees, scrotal edema, prominent JVD to his jaw RESP: no IWOB, good air movement bilaterally, bibasilar rales GI: BS present & normal, soft, NTND GU: no suprapubic or CVA tenderness MSK: no focal tenderness or notable swelling SKIN: no apparent skin lesion ENDO: negative thyromegally NEURO: alert and oiented appropriately, no gross deficits   PSYCH: euthymic mood with congruent affect  Labs and Imaging: CBC BMET  Recent Labs  Lab 02/09/18 1351  WBC 6.6  HGB 11.2*  HCT 36.3*  PLT 196   Recent Labs  Lab 02/09/18 1351  NA 144  K 4.6  CL 113*  CO2 23  BUN 29*  CREATININE 1.96*  GLUCOSE 96  CALCIUM 8.6*    Dg Chest 2 View  Result Date: 02/09/2018 CLINICAL DATA:  Shortness of breath and  wheezing for several days. Smoker. Coronary artery disease. Chronic kidney disease stage 3. EXAM: CHEST - 2 VIEW COMPARISON:  03/18/2015 FINDINGS: Stable mild cardiomegaly. Mild diffuse interstitial infiltrates and Kerley B-lines are seen, consistent with mild interstitial edema. No evidence of pulmonary consolidation. Tiny posterior pleural thickening or effusion is again seen bilaterally, without significant change. IMPRESSION: Mild diffuse interstitial infiltrates, suspicious for interstitial edema/congestive heart failure. Mild cardiomegaly. Electronically Signed   By: Earle Gell M.D.   On: 02/09/2018 13:24   Mercy Riding, MD 02/09/2018, 6:46 PM PGY-3, [place] Intern pager: [phone], text pages welcome

## 2018-02-09 NOTE — ED Triage Notes (Signed)
Pt presents for evaluation of sob/cp. Seen at PCP and sent here due to lower extremity edema bilaterally and tachycardia.

## 2018-02-09 NOTE — Progress Notes (Signed)
ANTICOAGULATION CONSULT NOTE - Initial Consult  Pharmacy Consult for heparin Indication: atrial fibrillation  Allergies  Allergen Reactions  . Ibuprofen Other (See Comments)    Kidney damage  . Nsaids     REACTION: nsaid induced nephropathy    Patient Measurements: Height: 6' (182.9 cm) Weight: 197 lb 5 oz (89.5 kg) IBW/kg (Calculated) : 77.6  Vital Signs: Temp: 98.6 F (37 C) (04/04 1814) Temp Source: Oral (04/04 1814) BP: 128/95 (04/04 2245) Pulse Rate: 130 (04/04 2245)  Labs: Recent Labs    02/09/18 1351  HGB 11.2*  HCT 36.3*  PLT 196  CREATININE 1.96*    Estimated Creatinine Clearance: 44.5 mL/min (A) (by C-G formula based on SCr of 1.96 mg/dL (H)).   Medical History: Past Medical History:  Diagnosis Date  . Arthritis   . CAD (coronary artery disease)    a. 03/2015 NSTEMI/PCI: LM nl, LAD 95p (3.5x20 Promus DES), D1 60, D2/3 mild dzs, LCX 66m (plan for staged pci), OM1/2 min irregs, RCA 20p, RPDA 60.  Marland Kitchen CKD (chronic kidney disease), stage III ([place])   . GERD (gastroesophageal reflux disease)    occ  . HOCM (hypertrophic obstructive cardiomyopathy) ([place])    a. 03/2015 Echo: EF 65-60%, sev LV thickening w/ bright, speckled appearance of IV septum, Gr 2 DD, AoV sclerosis, triv AI, mild MR, LA 80ml/m^2, RA 61ml/m^2, aneurysmal IAS, mod TR, PASP 1mmHg.  Marland Kitchen Hypertension    denies  . PAF (paroxysmal atrial fibrillation) ([place])    remote isolated episode - 09/2012.  . TMJ arthralgia     Assessment: 57yo male went to PCP for SOB and CP w/ LE edema > sent to ED for acute CHF +/- atrial flutter, to begin heparin.  Goal of Therapy:  Heparin level 0.3-0.7 units/ml Monitor platelets by anticoagulation protocol: Yes   Plan:  Will give heparin 4000 units IV bolus x1 followed by gtt at 1300 units/hr and monitor heparin levels and CBC.  Wynona Neat, PharmD, BCPS  02/09/2018,11:22 PM

## 2018-02-09 NOTE — ED Notes (Signed)
Message sent to pharmacy.  No Cardizem in POD B or POD D pyxis.  Request sent.

## 2018-02-09 NOTE — Progress Notes (Signed)
[name] Progress Note  Subjective:  Paul [name] is a 57 y.o. male with history of CKD, HFpEF(EF 60-75%, G2DD in 2016), CAD s/p stents x 2 (last to mid L circumflex in 2016), substance abuse, prior PAF, and chronic hip pain who presents for shortness of breath and leg swelling x 1 week. He reports symptom onset towards the end of last week. He noticed swelling in his thighs and scrotum and lower legs. He tried elevating his legs, thinking it would resolve on its own. He has had exertional dyspnea but thinks this has improved some. He was able to participate in men's choir at his church last night but felt tired walking to his car and had to sit for about 10 minutes until his breathing was under better control. He thinks heat/hot showers helps some. He denies orthopnea. He has not had recent follow-up with his kidney or heart doctor (last seeing Dr. Irish Lack in 2016). He denies chest pain or pressure. He denies recent drug use. He reports having about 2 alcoholic beverages a week. Last UDS positive for cocaine in September. He does report increased stress of helping his son through some legal problems. ROS: No syncope, no fevers   Allergies  Allergen Reactions  . Ibuprofen Other (See Comments)    Kidney damage  . Nsaids     REACTION: nsaid induced nephropathy    Social History   Tobacco Use  . Smoking status: Light Tobacco Smoker    Packs/day: 0.25    Years: 10.00    Pack years: 2.50    Types: Cigarettes  . Smokeless tobacco: Never Used  Substance Use Topics  . Alcohol use: Yes    Alcohol/week: 1.2 oz    Types: 2 Cans of beer per week    Comment: occasional    Objective: Blood pressure (!) 119/100, pulse (!) 129, temperature 98.6 F (37 C), temperature source Oral, weight 197 lb 6.4 oz (89.5 kg), SpO2 96 %. Body mass index is 26.77 kg/m. Constitutional: Patient well appearing in NAD Cardiovascular: Tachycardic but regular. JVD present.  Pulmonary/Chest:  Lungs CTAB. Able to speak in complete sentences but some nasal flaring.  Abdominal: Soft. +BS, NT Musculoskeletal: 2+ pitting edema of lower legs, trace edema to just above knee.  Neurological: AOx3, no focal deficits. Skin: Skin is warm and dry. No rash noted.  Psychiatric: Normal mood and affect.  Vitals reviewed  Assessment/Plan: Shortness of breath - Patient stable but with tachycardia, dyspnea but maintaining oxygen saturation on room air, and fluid overload. Precepted with attending Dr. Erin Hearing who also reviewed EKG. Recommended patient go to the emergency room for further management, as anticipate admission for diuresis and monitoring of heart rate. Arrhythmia may be underlying cause of symptoms vs secondary effect. No crackles on lung exam and denies orthopnea, making CHF exacerbation seem less likely. Last GFR consistent with CKDIIIb so high concern for progression of kidney failure. Patient denies chest pain but concern he could develop ischemia in setting of increased cardiac demand with history of CAD.  - EKG performed showing narrow complex tachycardia. Read as atrial flutter with 2:1 conduction but rate 132. Concern for SVT 2/2 renal or heart failure given patient's history. - Patient requests to go to ED by private vehicle.  Damaris Schooner with ED charge nurse [facility] and alerted her that patient would be presenting soon.   Olene Floss, MD [place], PGY-3

## 2018-02-09 NOTE — ED Notes (Signed)
Pt received dinner tray.

## 2018-02-09 NOTE — Assessment & Plan Note (Addendum)
-   Patient stable but with tachycardia, dyspnea but maintaining oxygen saturation on room air, and fluid overload. Precepted with attending Dr. Erin Hearing who also reviewed EKG. Recommended patient go to the emergency room for further management, as anticipate admission for diuresis and monitoring of heart rate. Arrhythmia may be underlying cause of symptoms vs secondary effect. No crackles on lung exam and denies orthopnea, making CHF exacerbation seem less likely. Last GFR consistent with CKDIIIb so high concern for progression of kidney failure. Patient denies chest pain but concern he could develop ischemia in setting of increased cardiac demand with history of CAD.  - EKG performed showing narrow complex tachycardia. Read as atrial flutter with 2:1 conduction but rate 132. Concern for SVT 2/2 renal or heart failure given patient's history. - Patient requests to go to ED by private vehicle.  Damaris Schooner with ED charge nurse [facility] and alerted her that patient would be presenting soon.

## 2018-02-10 ENCOUNTER — Inpatient Hospital Stay (HOSPITAL_COMMUNITY): Payer: Medicare HMO

## 2018-02-10 DIAGNOSIS — R945 Abnormal results of liver function studies: Secondary | ICD-10-CM

## 2018-02-10 DIAGNOSIS — I340 Nonrheumatic mitral (valve) insufficiency: Secondary | ICD-10-CM

## 2018-02-10 DIAGNOSIS — J9600 Acute respiratory failure, unspecified whether with hypoxia or hypercapnia: Secondary | ICD-10-CM

## 2018-02-10 DIAGNOSIS — I361 Nonrheumatic tricuspid (valve) insufficiency: Secondary | ICD-10-CM

## 2018-02-10 DIAGNOSIS — R7989 Other specified abnormal findings of blood chemistry: Secondary | ICD-10-CM

## 2018-02-10 DIAGNOSIS — I5033 Acute on chronic diastolic (congestive) heart failure: Secondary | ICD-10-CM

## 2018-02-10 DIAGNOSIS — J9601 Acute respiratory failure with hypoxia: Secondary | ICD-10-CM

## 2018-02-10 LAB — CBC WITH DIFFERENTIAL/PLATELET
BASOS PCT: 0 %
Basophils Absolute: 0 10*3/uL (ref 0.0–0.1)
EOS ABS: 0 10*3/uL (ref 0.0–0.7)
Eosinophils Relative: 0 %
HCT: 39.1 % (ref 39.0–52.0)
HEMOGLOBIN: 12.1 g/dL — AB (ref 13.0–17.0)
LYMPHS ABS: 1.8 10*3/uL (ref 0.7–4.0)
Lymphocytes Relative: 19 %
MCH: 24.3 pg — AB (ref 26.0–34.0)
MCHC: 30.9 g/dL (ref 30.0–36.0)
MCV: 78.7 fL (ref 78.0–100.0)
MONO ABS: 0.7 10*3/uL (ref 0.1–1.0)
MONOS PCT: 7 %
NEUTROS PCT: 74 %
Neutro Abs: 6.7 10*3/uL (ref 1.7–7.7)
Platelets: 189 10*3/uL (ref 150–400)
RBC: 4.97 MIL/uL (ref 4.22–5.81)
RDW: 14.3 % (ref 11.5–15.5)
WBC: 9.2 10*3/uL (ref 4.0–10.5)

## 2018-02-10 LAB — LACTIC ACID, PLASMA
Lactic Acid, Venous: 2.2 mmol/L (ref 0.5–1.9)
Lactic Acid, Venous: 2.4 mmol/L (ref 0.5–1.9)
Lactic Acid, Venous: 5.1 mmol/L (ref 0.5–1.9)

## 2018-02-10 LAB — URINALYSIS, ROUTINE W REFLEX MICROSCOPIC
Bilirubin Urine: NEGATIVE
GLUCOSE, UA: NEGATIVE mg/dL
HGB URINE DIPSTICK: NEGATIVE
Ketones, ur: NEGATIVE mg/dL
LEUKOCYTES UA: NEGATIVE
Nitrite: NEGATIVE
Protein, ur: NEGATIVE mg/dL
SPECIFIC GRAVITY, URINE: 1.008 (ref 1.005–1.030)
pH: 6 (ref 5.0–8.0)

## 2018-02-10 LAB — HEPARIN LEVEL (UNFRACTIONATED)
HEPARIN UNFRACTIONATED: 0.26 [IU]/mL — AB (ref 0.30–0.70)
Heparin Unfractionated: 0.39 IU/mL (ref 0.30–0.70)
Heparin Unfractionated: >2.2 IU/mL — ABNORMAL HIGH (ref 0.30–0.70)

## 2018-02-10 LAB — CBC
HCT: 37.6 % — ABNORMAL LOW (ref 39.0–52.0)
Hemoglobin: 11.9 g/dL — ABNORMAL LOW (ref 13.0–17.0)
MCH: 24.9 pg — ABNORMAL LOW (ref 26.0–34.0)
MCHC: 31.6 g/dL (ref 30.0–36.0)
MCV: 78.8 fL (ref 78.0–100.0)
Platelets: 170 10*3/uL (ref 150–400)
RBC: 4.77 MIL/uL (ref 4.22–5.81)
RDW: 14.8 % (ref 11.5–15.5)
WBC: 7.4 10*3/uL (ref 4.0–10.5)

## 2018-02-10 LAB — TROPONIN I
TROPONIN I: 0.19 ng/mL — AB (ref <0.03)
Troponin I: 0.11 ng/mL (ref <0.03)
Troponin I: 0.14 ng/mL (ref <0.03)
Troponin I: 0.15 ng/mL (ref <0.03)
Troponin I: 0.19 ng/mL (ref <0.03)
Troponin I: 0.22 ng/mL (ref <0.03)

## 2018-02-10 LAB — BLOOD GAS, ARTERIAL
Acid-base deficit: 8.7 mmol/L — ABNORMAL HIGH (ref 0.0–2.0)
BICARBONATE: 16.7 mmol/L — AB (ref 20.0–28.0)
Drawn by: 518061
EXPIRATORY PAP: 5
FIO2: 40
Inspiratory PAP: 10
O2 Saturation: 98.5 %
PATIENT TEMPERATURE: 98.6
PCO2 ART: 35.8 mmHg (ref 32.0–48.0)
PH ART: 7.29 — AB (ref 7.350–7.450)
pO2, Arterial: 146 mmHg — ABNORMAL HIGH (ref 83.0–108.0)

## 2018-02-10 LAB — HEMOGLOBIN A1C
Hgb A1c MFr Bld: 5.1 % (ref 4.8–5.6)
Mean Plasma Glucose: 99.67 mg/dL

## 2018-02-10 LAB — MAGNESIUM: MAGNESIUM: 2.2 mg/dL (ref 1.7–2.4)

## 2018-02-10 LAB — COMPREHENSIVE METABOLIC PANEL
ALBUMIN: 3.4 g/dL — AB (ref 3.5–5.0)
ALK PHOS: 73 U/L (ref 38–126)
ALT: 281 U/L — ABNORMAL HIGH (ref 17–63)
ALT: 536 U/L — ABNORMAL HIGH (ref 17–63)
AST: 367 U/L — ABNORMAL HIGH (ref 15–41)
AST: 598 U/L — AB (ref 15–41)
Albumin: 3.2 g/dL — ABNORMAL LOW (ref 3.5–5.0)
Alkaline Phosphatase: 71 U/L (ref 38–126)
Anion gap: 15 (ref 5–15)
Anion gap: 15 (ref 5–15)
BILIRUBIN TOTAL: 3.4 mg/dL — AB (ref 0.3–1.2)
BUN: 33 mg/dL — ABNORMAL HIGH (ref 6–20)
BUN: 37 mg/dL — AB (ref 6–20)
CO2: 16 mmol/L — ABNORMAL LOW (ref 22–32)
CO2: 16 mmol/L — ABNORMAL LOW (ref 22–32)
Calcium: 8 mg/dL — ABNORMAL LOW (ref 8.9–10.3)
Calcium: 8.3 mg/dL — ABNORMAL LOW (ref 8.9–10.3)
Chloride: 110 mmol/L (ref 101–111)
Chloride: 112 mmol/L — ABNORMAL HIGH (ref 101–111)
Creatinine, Ser: 2.61 mg/dL — ABNORMAL HIGH (ref 0.61–1.24)
Creatinine, Ser: 2.95 mg/dL — ABNORMAL HIGH (ref 0.61–1.24)
GFR calc Af Amer: 29 mL/min — ABNORMAL LOW (ref >60)
GFR calc Af Amer: 25 mL/min — ABNORMAL LOW (ref >60)
GFR calc non Af Amer: 25 mL/min — ABNORMAL LOW (ref >60)
GFR, EST NON AFRICAN AMERICAN: 22 mL/min — AB (ref >60)
GLUCOSE: 99 mg/dL (ref 65–99)
Glucose, Bld: 90 mg/dL (ref 65–99)
Potassium: 4.6 mmol/L (ref 3.5–5.1)
Potassium: 5.7 mmol/L — ABNORMAL HIGH (ref 3.5–5.1)
Sodium: 141 mmol/L (ref 135–145)
Sodium: 143 mmol/L (ref 135–145)
TOTAL PROTEIN: 6.1 g/dL — AB (ref 6.5–8.1)
Total Bilirubin: 3.3 mg/dL — ABNORMAL HIGH (ref 0.3–1.2)
Total Protein: 5.7 g/dL — ABNORMAL LOW (ref 6.5–8.1)

## 2018-02-10 LAB — HIV ANTIBODY (ROUTINE TESTING W REFLEX): HIV Screen 4th Generation wRfx: NONREACTIVE

## 2018-02-10 LAB — AMYLASE: Amylase: 98 U/L (ref 28–100)

## 2018-02-10 LAB — RAPID URINE DRUG SCREEN, HOSP PERFORMED
AMPHETAMINES: NOT DETECTED
BARBITURATES: NOT DETECTED
BENZODIAZEPINES: NOT DETECTED
Cocaine: NOT DETECTED
Opiates: NOT DETECTED
Tetrahydrocannabinol: NOT DETECTED

## 2018-02-10 LAB — ECHOCARDIOGRAM COMPLETE
Height: 72 in
Weight: 3156.99 oz

## 2018-02-10 LAB — CREATININE, URINE, RANDOM: Creatinine, Urine: 19.65 mg/dL

## 2018-02-10 LAB — SODIUM, URINE, RANDOM: Sodium, Ur: 140 mmol/L

## 2018-02-10 LAB — LIPASE, BLOOD: LIPASE: 33 U/L (ref 11–51)

## 2018-02-10 LAB — MRSA PCR SCREENING: MRSA BY PCR: NEGATIVE

## 2018-02-10 LAB — TSH: TSH: 0.618 u[IU]/mL (ref 0.350–4.500)

## 2018-02-10 LAB — GLUCOSE, CAPILLARY: Glucose-Capillary: 114 mg/dL — ABNORMAL HIGH (ref 65–99)

## 2018-02-10 LAB — CBG MONITORING, ED: Glucose-Capillary: 115 mg/dL — ABNORMAL HIGH (ref 65–99)

## 2018-02-10 LAB — PROCALCITONIN: Procalcitonin: 0.17 ng/mL

## 2018-02-10 MED ORDER — GI COCKTAIL ~~LOC~~
30.0000 mL | Freq: Once | ORAL | Status: AC
  Administered 2018-02-10: 30 mL via ORAL
  Filled 2018-02-10: qty 30

## 2018-02-10 MED ORDER — SODIUM CHLORIDE 0.9 % IV BOLUS
500.0000 mL | Freq: Once | INTRAVENOUS | Status: AC
  Administered 2018-02-10: 500 mL via INTRAVENOUS

## 2018-02-10 MED ORDER — ALUM & MAG HYDROXIDE-SIMETH 200-200-20 MG/5ML PO SUSP: 15.0000 mL | ORAL | Status: DC | PRN

## 2018-02-10 MED ORDER — ORAL CARE MOUTH RINSE
15.0000 mL | Freq: Two times a day (BID) | OROMUCOSAL | Status: DC
  Administered 2018-02-10 – 2018-02-19 (×8): 15 mL via OROMUCOSAL

## 2018-02-10 MED ORDER — AMIODARONE LOAD VIA INFUSION
150.0000 mg | Freq: Once | INTRAVENOUS | Status: AC
  Administered 2018-02-10: 150 mg via INTRAVENOUS
  Filled 2018-02-10: qty 83.34

## 2018-02-10 MED ORDER — PANTOPRAZOLE SODIUM 40 MG IV SOLR
40.0000 mg | INTRAVENOUS | Status: DC
  Administered 2018-02-10: 40 mg via INTRAVENOUS
  Filled 2018-02-10: qty 40

## 2018-02-10 MED ORDER — MAGNESIUM SULFATE 2 GM/50ML IV SOLN
2.0000 g | Freq: Once | INTRAVENOUS | Status: AC
  Administered 2018-02-10: 2 g via INTRAVENOUS
  Filled 2018-02-10: qty 50

## 2018-02-10 MED ORDER — SODIUM BICARBONATE 8.4 % IV SOLN
50.0000 meq | Freq: Once | INTRAVENOUS | Status: AC
  Administered 2018-02-10: 50 meq via INTRAVENOUS
  Filled 2018-02-10: qty 50

## 2018-02-10 MED ORDER — MORPHINE SULFATE (PF) 4 MG/ML IV SOLN
INTRAVENOUS | Status: AC
  Filled 2018-02-10: qty 1

## 2018-02-10 MED ORDER — SUCRALFATE 1 GM/10ML PO SUSP
1.0000 g | Freq: Three times a day (TID) | ORAL | Status: DC
  Administered 2018-02-10 – 2018-02-19 (×33): 1 g via ORAL
  Filled 2018-02-10 (×33): qty 10

## 2018-02-10 MED ORDER — MORPHINE SULFATE (PF) 4 MG/ML IV SOLN
2.0000 mg | Freq: Once | INTRAVENOUS | Status: AC
  Administered 2018-02-10: 2 mg via INTRAVENOUS

## 2018-02-10 MED ORDER — MORPHINE SULFATE (PF) 4 MG/ML IV SOLN
1.0000 mg | INTRAVENOUS | Status: AC | PRN
  Administered 2018-02-10 (×3): 1 mg via INTRAVENOUS
  Filled 2018-02-10 (×3): qty 1

## 2018-02-10 MED ORDER — FUROSEMIDE 10 MG/ML IJ SOLN
40.0000 mg | Freq: Once | INTRAMUSCULAR | Status: AC
  Administered 2018-02-10: 40 mg via INTRAVENOUS
  Filled 2018-02-10: qty 4

## 2018-02-10 MED ORDER — PIPERACILLIN-TAZOBACTAM 3.375 G IVPB
3.3750 g | Freq: Three times a day (TID) | INTRAVENOUS | Status: DC
  Administered 2018-02-10 – 2018-02-14 (×12): 3.375 g via INTRAVENOUS
  Filled 2018-02-10 (×15): qty 50

## 2018-02-10 MED ORDER — MORPHINE SULFATE (PF) 4 MG/ML IV SOLN
1.0000 mg | INTRAVENOUS | Status: AC | PRN
  Administered 2018-02-10 – 2018-02-11 (×3): 1 mg via INTRAVENOUS
  Filled 2018-02-10 (×3): qty 1

## 2018-02-10 MED ORDER — ACETAMINOPHEN 10 MG/ML IV SOLN
1000.0000 mg | Freq: Once | INTRAVENOUS | Status: AC
  Administered 2018-02-10: 1000 mg via INTRAVENOUS
  Filled 2018-02-10: qty 100

## 2018-02-10 MED ORDER — FUROSEMIDE 10 MG/ML IJ SOLN
20.0000 mg | Freq: Once | INTRAMUSCULAR | Status: AC
  Administered 2018-02-10: 20 mg via INTRAVENOUS
  Filled 2018-02-10: qty 2

## 2018-02-10 MED ORDER — AMIODARONE HCL IN DEXTROSE 360-4.14 MG/200ML-% IV SOLN
60.0000 mg/h | INTRAVENOUS | Status: AC
  Administered 2018-02-10: 60 mg/h via INTRAVENOUS
  Filled 2018-02-10 (×2): qty 200

## 2018-02-10 MED ORDER — ACETAMINOPHEN 325 MG PO TABS
650.0000 mg | ORAL_TABLET | Freq: Four times a day (QID) | ORAL | Status: DC | PRN
  Administered 2018-02-10: 650 mg via ORAL
  Filled 2018-02-10: qty 2

## 2018-02-10 MED ORDER — AMIODARONE HCL IN DEXTROSE 360-4.14 MG/200ML-% IV SOLN
30.0000 mg/h | INTRAVENOUS | Status: DC
  Administered 2018-02-10 – 2018-02-11 (×2): 30 mg/h via INTRAVENOUS
  Filled 2018-02-10: qty 200

## 2018-02-10 MED ORDER — SODIUM CHLORIDE 0.9 % IV SOLN
0.0000 ug/min | INTRAVENOUS | Status: DC
  Administered 2018-02-10 (×2): 100 ug/min via INTRAVENOUS
  Administered 2018-02-10: 40 ug/min via INTRAVENOUS
  Administered 2018-02-10: 25 ug/min via INTRAVENOUS
  Administered 2018-02-10: 20 ug/min via INTRAVENOUS
  Administered 2018-02-10: 60 ug/min via INTRAVENOUS
  Filled 2018-02-10: qty 1
  Filled 2018-02-10: qty 10
  Filled 2018-02-10: qty 1
  Filled 2018-02-10 (×3): qty 10
  Filled 2018-02-10: qty 1

## 2018-02-10 NOTE — Progress Notes (Signed)
Progress Note/Stat Echo Prelim Results Notified that pt had sudden change in HR/BP; it sounds like pt had a blood draw around midnight, and was having a large bowel movement around the same time. Following these two events, HR dropped from 130s down into the 60s w/ no change in dilt gtt, still in atrial flutter, and SBP dropped into the 60s-80s. dilt gtt was subsequently discontinued. Pt had never gotten any more metoprolol after the initial 12.$RemoveBefore'5mg'[id number]$  given earlier in the evening. He was given a small fluid bolus w/ some improvement; CCM was consulted and neosynephrine was initiated. Stat echo obtained; results showed mildly dilated LV w/ EF~35-40%, mod-severe LVH w/ trabeculation around the LV apex, severe biatrial enlargement, dilated RV w/ RVH, RV function slightly reduced visually, aortic sclerosis w/o stenosis, mod-severe MR, mild TR w/ est RVSP~50-55 (IVC dilated w/ no resp variation), trace PI, no effusion. I have no good explanation for the sudden drop in BP/HR other than possible vasovagal event, although pressure and HR have both remained low since that time.  I think the ddx includes hypertensive cardiomyopathy, LV non-compaction CM, hypertrophic CM, infiltrative CM. I think ICM is less likely given the appearance and thickening of the myocardium. cMRI may be of help in establishing a diagnosis. Will cont to follow.

## 2018-02-10 NOTE — Progress Notes (Signed)
eLink Physician-Brief Progress Note Patient Name: Paul [name] DOB: [birth year] MRN: [id number]   Date of Service  02/10/2018  HPI/Events of Note  [name] has climbing lactic acid in setting of normal BP.  He is c/o abd pain and he does have elevated LFT's.    eICU Interventions  RUQ abd [place] ordered earlier changed to stat Repeat ABG, hepatic function panel, and cbc stat     Intervention Category Major Interventions: Other:  Mauri Brooklyn, P 02/10/2018, 10:08 PM

## 2018-02-10 NOTE — ED Notes (Signed)
Echocardiogram technician at bedside

## 2018-02-10 NOTE — Care Management Note (Addendum)
Case Management Note  Patient Details  Name: Paul [name] MRN: [id number] Date of Birth: 12-04-1960  Subjective/Objective:         History of diastolic heart failure, cad, A-fib, CKD admitted w/ CHF exacerbation.      Action/Plan: In to speak with patient, daughter "Caryl Pina" and Son at bedside.  Permission given to speak in front of daughter/son given by patient.  Verified PCP.  Patent attorney in [place].  Prior to admission patient lived at home alone. Will be returning to the same living situation after discharge.  At discharge, patient has transportation home.  NCM discussed the fact patient stated he had not been taking his medications prior to admission.  Daughter stressed concern of father not taking medication and stated "I live a hour and a half away, thought he had been taking his medications; she will try to see him more often to make sure he does. Patient has the ability to pay for medications and food. Patient does his own cooking and shopping.  NCM discussed importance of cooking with no salt.  Patient admits to using salt prior to admission. Discussed recommendation of Dietician  and patient agreed to consult.  Patient does not have a scale at home, NCM educated the patient on the importance weighing himself and he plans to get a scale for home. Patient has never used Home Health agency in the past.  Patient became restless. NCM will continue to follow for discharge transition needs.  Expected Discharge Date:    To Be Determined              Expected Discharge Plan:  Home/Self Care  In-House Referral:   Dietician  Discharge planning Services  CM Consult  Status of Service:  In process, will continue to follow  Royetta Crochet. Mady Gemma, BSN, RN Nurse case [place] 02/10/2018, 1:50 PM

## 2018-02-10 NOTE — Progress Notes (Signed)
ANTICOAGULATION CONSULT NOTE - Follow-Up  Pharmacy Consult for Heparin Indication: atrial fibrillation  Allergies  Allergen Reactions  . Ibuprofen Other (See Comments)    Kidney damage  . Nsaids     REACTION: nsaid induced nephropathy    Patient Measurements: Height: 6' (182.9 cm) Weight: 193 lb 5.5 oz (87.7 kg) IBW/kg (Calculated) : 77.6  Vital Signs: Temp: 97.4 F (36.3 C) (04/05 0714) Temp Source: Oral (04/05 0714) BP: 100/69 (04/05 0900) Pulse Rate: 40 (04/05 0900)  Labs: Recent Labs    02/09/18 1351 02/10/18 0321 02/10/18 0623  HGB 11.2*  --  11.9*  HCT 36.3*  --  37.6*  PLT 196  --  170  CREATININE 1.96*  --  2.61*  TROPONINI  --  0.11* 0.14*    Estimated Creatinine Clearance: 33.4 mL/min (A) (by C-G formula based on SCr of 2.61 mg/dL (H)).   Assessment: 57yo male went to PCP for SOB and CP w/ LE edema > sent to ED for acute CHF +/- atrial flutter, to begin heparin. Noted hx PAF PTA however was not on any anticoagulation for this.  Heparin level this morning resulted as therapeutic (HL 0.39, goal of 0.3-0.7). Hgb 11.9, plts wnl  Goal of Therapy:  Heparin level 0.3-0.7 units/ml Monitor platelets by anticoagulation protocol: Yes   Plan:  - Continue Heparin at 1300 units/hr (13 ml/hr) - Will continue to monitor for any signs/symptoms of bleeding and will follow up with heparin level in 6 hours to confirm  Thank you for allowing pharmacy to be a part of this patient's care.  Alycia Rossetti, PharmD, BCPS Clinical Pharmacist Pager: [phone] 02/10/2018 9:14 AM

## 2018-02-10 NOTE — Progress Notes (Signed)
  Echocardiogram 2D Echocardiogram has been performed.  Tambi Thole T Nikkole Placzek 02/10/2018, 3:27 AM

## 2018-02-10 NOTE — Progress Notes (Addendum)
PULMONARY / CRITICAL CARE MEDICINE   Name: Paul [name] MRN: [id number] DOB: September 25, 1961    ADMISSION DATE:  02/09/2018 CONSULTATION DATE:  02/10/2018  REFERRING MD:  Dr. Erin Hearing   CHIEF COMPLAINT:  Hypotension   HISTORY OF PRESENT ILLNESS:   Paul [name] is a 57 yo M with history of HFpEF EF 65% 03/2015, CAD and NSTEMI s/p LAD DES in 5/ 2016 and CTX 04/2015, HTN, PAF last in 2013, ED, HLD, CKD 4, tobacco use and DJD s/p L THR 07/2014 who presented to the clinic with dyspnea, wheezing, and LE edema x 1 week. EKG remarkable for SVT and he was sent to the ED for further management. He was in his usual state of health until 1 week ago when he started experiencing dyspnea on exertion, audible wheezing, and LE swelling. He has a history of HFpEF but he is not on EBT for HF and has not followed up with cardiology since 2016. He is not compliant with low sodium diet. He is on chronic opiate therapy for chronic L hip and knee pain. Prescribed Percocet 7.5 BID.   In the ED his EKG showed atrial flutter/afib with HR in the 130s and he was started on heparin and Cardizem gtt started. BNP 1700. IV Lasix 40 mg x1 given as well for volume overload. He remained in RVR despite Cardizem gtt and cardiology consulted who recommended adding PO metoprolol. He received 12.5 mg of PO metop x1 and became hypotensive with sBP 60s and MAP 50. He had 3 500cc NS bolus with no improvement in hypotension and PCCM consulted for further management.   SUBJECTIVE:  Awake and alert, he is complaining of hunger and thirst. States his shortness of breath is better VITAL SIGNS: BP 97/72   Pulse (!) 31   Temp (!) 97.4 F (36.3 C) (Oral)   Resp (!) 28   Ht 6' (1.829 m)   Wt 193 lb 5.5 oz (87.7 kg)   SpO2 100%   BMI 26.22 kg/m   HEMODYNAMICS:    VENTILATOR SETTINGS:    INTAKE / OUTPUT: I/O last 3 completed shifts: In: 2014.3 [I.V.:414.3; IV Piggyback:1600] Out: 5397 [Urine:4920]  PHYSICAL  EXAMINATION: General:  Well-developed, well-nourished male who is in NAD , is hungry  Neuro:  Awake alert and oriented, MAE x 4 HEENT:  EOMI, PERRL, NCAT Cardiovascular: S1, S2,   RRR, no mrg, + JVD , 1+ LE edema Lungs:  Clear to auscultation bilaterally, no wheezes or crackles, slight increased work of breathing , no accessory muscle use  Abdomen:  Soft, NTND, normoactive bowel sounds  Musculoskeletal:  2+ LE edema up to thighs bilaterally  Skin:  No rashes noted   LABS:  BMET Recent Labs  Lab 02/09/18 1351 02/10/18 0623  NA 144 141  K 4.6 4.6  CL 113* 110  CO2 23 16*  BUN 29* 33*  CREATININE 1.96* 2.61*  GLUCOSE 96 90    Electrolytes Recent Labs  Lab 02/09/18 1351 02/10/18 0623  CALCIUM 8.6* 8.0*  MG 1.7  --     CBC Recent Labs  Lab 02/09/18 1351 02/10/18 0623  WBC 6.6 7.4  HGB 11.2* 11.9*  HCT 36.3* 37.6*  PLT 196 170    Coag's No results for input(s): APTT, INR in the last 168 hours.  Sepsis Markers Recent Labs  Lab 02/10/18 0623  LATICACIDVEN 2.2*  PROCALCITON 0.17    ABG No results for input(s): PHART, PCO2ART, PO2ART in the last 168 hours.  Liver  Enzymes Recent Labs  Lab 02/10/18 0623  AST 367*  ALT 281*  ALKPHOS 71  BILITOT 3.3*  ALBUMIN 3.2*    Cardiac Enzymes Recent Labs  Lab 02/10/18 0321 02/10/18 0623  TROPONINI 0.11* 0.14*    Glucose Recent Labs  Lab 02/10/18 0001  GLUCAP 115*    Imaging Dg Chest 2 View  Result Date: 02/09/2018 CLINICAL DATA:  Shortness of breath and wheezing for several days. Smoker. Coronary artery disease. Chronic kidney disease stage 3. EXAM: CHEST - 2 VIEW COMPARISON:  03/18/2015 FINDINGS: Stable mild cardiomegaly. Mild diffuse interstitial infiltrates and Kerley B-lines are seen, consistent with mild interstitial edema. No evidence of pulmonary consolidation. Tiny posterior pleural thickening or effusion is again seen bilaterally, without significant change. IMPRESSION: Mild diffuse  interstitial infiltrates, suspicious for interstitial edema/congestive heart failure. Mild cardiomegaly. Electronically Signed   By: Earle Gell M.D.   On: 02/09/2018 13:24   Dg Chest Portable 1 View  Result Date: 02/10/2018 CLINICAL DATA:  Chest pain EXAM: PORTABLE CHEST 1 VIEW COMPARISON:  Chest radiograph 02/09/2018 at 1:29 p.m. FINDINGS: Moderate cardiomegaly. Slightly improved aeration of the lungs. No focal consolidation. No pleural effusion or pneumothorax. IMPRESSION: Unchanged cardiomegaly with improved aeration of the lungs. No overt pulmonary edema. Electronically Signed   By: Ulyses Jarred M.D.   On: 02/10/2018 02:01     STUDIES:  CXR 4/5 with cardiomegaly, no overt pulmonary edema  TTE Pending  Bedside WKG   CULTURES: None   ANTIBIOTICS: None   SIGNIFICANT EVENTS: Cardizem gtt 4/4-4/5 Neo-synephrine started 4/5  LINES/TUBES: PIV in RUE and LUE    ASSESSMENT / PLAN:  PULMONARY A: Dyspnea secondary to volume overload due to HF exacerbation - No over pulmonary edema on CXR, Lungs CTAB on exam and currently oxygenating well on room air   P:   - S/p IV Lasix 40 x1 - Hold further diuresis in the setting of hemodynamic instability  - Continue to monitor respiratory status  - titrate oxygen to maintain sats > 94% - IS - Pulmonary Toilet   CARDIOVASCULAR A:  Afib/flutter with RVR - Currently rate controlled with HR 60s, but hypotensive  HFpEF EF 65% 03/2015 - Not on evidence based therapies and does not follow up with cardiology  CAD and NSTEMI s/p LAD DES in 5/ 2016 and LCx 04/2015 PAF last in 2013 02/10/2018>> Echo>>results showed mildly dilated LV w/ EF~35-40%, mod-severe LVH w/ trabeculation around the LV apex, severe biatrial enlargement, dilated RV w/ RVH, RV function slightly reduced visually, aortic sclerosis w/o stenosis, mod-severe MR, mild TR w/ est RVSP~50-55 (IVC dilated w/ no resp variation), trace PI, no effusion. Lactate 2.2  P:  - Telemetry  - Trend  troponins - EP Consult per cards note - Echo>> see results above - S/p IV Lasix 40 mg x1. Hold further diuresis in the setting of hemodynamic instability. May resume when hemodynamically stable.  - Stop cardizem gtt and PO metoprolol in the setting of hypotension  - Consider amiodarone if converts to Afib/flutter with RVR again  - Continue weaning neo-synephrine - Continue heparin gtt   - Strict I/Os + daily weights  - Cardiology following  - recheck lactate 1600  RENAL A:   Hypotension  Hx of HTN  CKD Stage 4 with baseline Cr ~1.9-2 - Renal function currently at baseline  P:   - Management as above  - Hold all diuresis and antihypertensive medications in the setting of hypotension. May resume when  hemodynamically stable  -  Monitor lytes and renal function  - Replete electrolytes as needed  GASTROINTESTINAL A:   P:   - Heart healthy with with fluid restriction   HEMATOLOGIC A:   On heparin gtt for Afib/flutter  P:  - Trend CBCs  - Monitor for active bleeding  INFECTIOUS A:   No signs/sysptoms suggestive of infection at this time  Afebrile PCT 0.17 P:   Trend WBC and fever curve Culture if clinically indicated   ENDOCRINE A:   None - no history of DM    P:   Follow glucose per BMET  NEUROLOGIC A:   Chronic pain 2/2  Chronic L hip and knees s/p L THR 07/2014 and on chronic opiate therapy  P:   - Hold narcotics in the setting of hemodynamic stability.  - May resume home Percocet when hemodynamically stable.    Magdalen Spatz, AGACNP-BC [facility] Pager [phone] 02/10/2018, 10:19 AM

## 2018-02-10 NOTE — Progress Notes (Signed)
Late entry ~0000: Paged by patient's RN about sudden BP drop to 72/53 after patient had a blood draw about midnight. Thought to be vasovagal given episode of large bowel movement at the same time.  On arrival to his room, patient hypotensive to 60's/40's. Discontinued dilt drip and gave 250 cc bolus without significant response. Gave another 500 cc bolus. Then paged and discussed patient with cardiology and CCM. Cardiology arrived at bedside to evaluate patient. Obtained EKG as patient started endorsing chest pain. EKG didn't show significant acute ischemic change. Portable CXR unchanged. No mediastinal widening.  Patient remained hypotensive to low 60's/50's after 1250 cc bolus fluid. CCM arrived at bedside and evaluating patient. Appreciate their input.

## 2018-02-10 NOTE — Progress Notes (Signed)
CRITICAL VALUE ALERT  Critical Value:  Lactic   Date & Time Notied: 02/10/2018 21:55  Provider Notified: Warren Lacy RN   Orders Received/Actions taken: Will notify MD

## 2018-02-10 NOTE — Progress Notes (Signed)
CRITICAL VALUE ALERT  Critical Value: Troponin and lactic   Date & Time Notied: 02/10/2018 20:55   Provider Notified: Warren Lacy RN  Orders Received/Actions taken: MD is on phone. Elink RN will notify Elink MD.

## 2018-02-10 NOTE — ED Notes (Addendum)
Admitting MD Chambliss aware. Pt's BP 70/57. EKG shot. Comfort measures applied. Pt stated the need to have a BM and placed on bed pan. ED MD [facility] notified as well.

## 2018-02-10 NOTE — Progress Notes (Addendum)
CCM interval note  Called to the bedside as patient developed acute epigastric pain while eating and  tachypneic to the 40s. Just got started on amiodarone.  Blood pressure (!) 134/101, pulse (!) 121, temperature 98.4 F (36.9 C), temperature source Oral, resp. rate (!) 34, height 6' (1.829 m), weight 193 lb 5.5 oz (87.7 kg), SpO2 95 %. On my examination he appears to be in moderate discomfort with diffuse epigastric tenderness, positive bowel sounds Lung sounds are clear CVS- tachycardia, irregular  Appears to be protecting airway We will check EKG, chest, abdomen x-ray, labs including troponin, amylase, lipase Morphine 2 mg, give sucralfate and Maalox  Additional critical care time-30 minutes Bradyn Vassey MD  Pulmonary and Critical Care Pager [phone] If no answer or after 3pm call: [phone] 02/10/2018, 2:21 PM   Addendum Labs, CXR,reviewed- Shows worsening basal opacities representing Fluid vs infection Start zosyn, No need for vanco as MRSA swab is negative Hold diuresis as Cr and LA are slightly higher Started on Bipap with improvement in clinical status Will continue to monitor

## 2018-02-10 NOTE — Progress Notes (Signed)
CRITICAL VALUE ALERT  Critical Value:  Troponin 0.11  Date & Time Notied:  02/10/18 4:28 AM   Provider Notified: E link Dr. Lucile Shutters  Orders Received/Actions taken: acknowledged

## 2018-02-10 NOTE — Consult Note (Signed)
PULMONARY / CRITICAL CARE MEDICINE   Name: Paul [name] MRN: [id number] DOB: 1961-03-22    ADMISSION DATE:  02/09/2018 CONSULTATION DATE:  02/10/2018  REFERRING MD:  Dr. Erin Hearing   CHIEF COMPLAINT:  Hypotension   HISTORY OF PRESENT ILLNESS:   Paul [name] is a 57 yo M with history of HFpEF EF 65% 03/2015, CAD and NSTEMI s/p LAD DES in 5/ 2016 and CTX 04/2015, HTN, PAF last in 2013, ED, HLD, CKD 4, tobacco use and DJD s/p L THR 07/2014 who presented to the clinic with dyspnea, wheezing, and LE edema x 1 week. EKG remarkable for SVT and he was sent to the ED for further management. He was in his usual state of health until 1 week ago when he started experiencing dyspnea on exertion, audible wheezing, and LE swelling. He has a history of HFpEF but he is not on EBT for HF and has not followed up with cardiology since 2016. He is not compliant with low sodium diet. He is on chronic opiate therapy for chronic L hip and knee pain. Prescribed Percocet 7.5 BID.   In the ED his EKG showed atrial flutter/afib with HR in the 130s and he was started on heparin and Cardizem gtt started. BNP 1700. IV Lasix 40 mg x1 given as well for volume overload. He remained in RVR despite Cardizem gtt and cardiology consulted who recommended adding PO metoprolol. He received 12.5 mg of PO metop x1 and became hypotensive with sBP 60s and MAP 50. He had 3 500cc NS bolus with no improvement in hypotension and PCCM consulted for further management.   PAST MEDICAL HISTORY :  He  has a past medical history of Arthritis, CAD (coronary artery disease), CKD (chronic kidney disease), stage III ([place]), GERD (gastroesophageal reflux disease), HOCM (hypertrophic obstructive cardiomyopathy) ([place]), Hypertension, PAF (paroxysmal atrial fibrillation) ([place]), and TMJ arthralgia.  PAST SURGICAL HISTORY: He  has a past surgical history that includes Hip surgery (Left, 72); Total hip arthroplasty (Right, 03,01); Appendectomy; laparoscopy  (09/22/2012); laparoscopic appendectomy (09/22/2012); Total hip arthroplasty with hardware removal (Left, 08/05/2014); Cardiac catheterization (N/A, 03/19/2015); Cardiac catheterization (03/19/2015); and Cardiac catheterization (N/A, 04/09/2015).  Allergies  Allergen Reactions  . Ibuprofen Other (See Comments)    Kidney damage  . Nsaids     REACTION: nsaid induced nephropathy    No current facility-administered medications on file prior to encounter.    Current Outpatient Medications on File Prior to Encounter  Medication Sig  . sildenafil (VIAGRA) 100 MG tablet Take 1 tablet (100 mg total) by mouth daily as needed for erectile dysfunction. 30 min-4 hrs before sexual activity. Do not use with nitro.  . oxyCODONE-acetaminophen (PERCOCET/ROXICET) 5-325 MG tablet Take 1 tablet by mouth as needed.    FAMILY HISTORY:  His indicated that his mother is deceased. He indicated that his father is deceased.   SOCIAL HISTORY: He  reports that he has been smoking cigarettes.  He has a 2.50 pack-year smoking history. He has never used smokeless tobacco. He reports that he drinks about 1.2 oz of alcohol per week. He reports that he does not use drugs.  REVIEW OF SYSTEMS:   Denies fever, chills, HA, visual changes, chest pain, palpitations, abdominal pain, N/V, urinary symptoms and changes in bowel movements.  Endorses shortness of breath and lower back pain.   SUBJECTIVE:  Patient had just had a BM when seen in the ED and was complaining of lower back pain. Stated he had not taken Percocet today.  He also endorsed dyspnea. Did not voice any other complaints. Denied chest pain and palpitations.   VITAL SIGNS: BP (!) 65/39   Pulse 62   Temp 98.6 F (37 C) (Oral)   Resp (!) 38   Ht 6' (1.829 m)   Wt 197 lb 5 oz (89.5 kg)   SpO2 99%   BMI 26.76 kg/m   HEMODYNAMICS:    VENTILATOR SETTINGS:    INTAKE / OUTPUT: I/O last 3 completed shifts: In: -  Out: 620 [Urine:620]  PHYSICAL  EXAMINATION: General:  Well-developed, well-nourished male who is in moderate distress from lower back pain  Neuro:  Alert and oriented, able to move all 4 extremities spontaneously and follow commands, no focal deficits noted  HEENT:  EOMI, PERRL, no scleral icterus or conjunctival injection, MMM  Cardiovascular:  RRR, no mrg, JVD 12 Lungs:  Clear to auscultation bilaterally, no wheezes or crackles, increased work of breathing with no accessory muscle use  Abdomen:  Soft, NTND, normoactive bowel sounds  Musculoskeletal:  2+ LE edema up to thighs bilaterally  Skin:  No rashes noted   LABS:  BMET Recent Labs  Lab 02/09/18 1351  NA 144  K 4.6  CL 113*  CO2 23  BUN 29*  CREATININE 1.96*  GLUCOSE 96    Electrolytes Recent Labs  Lab 02/09/18 1351  CALCIUM 8.6*  MG 1.7    CBC Recent Labs  Lab 02/09/18 1351  WBC 6.6  HGB 11.2*  HCT 36.3*  PLT 196    Coag's No results for input(s): APTT, INR in the last 168 hours.  Sepsis Markers No results for input(s): LATICACIDVEN, PROCALCITON, O2SATVEN in the last 168 hours.  ABG No results for input(s): PHART, PCO2ART, PO2ART in the last 168 hours.  Liver Enzymes No results for input(s): AST, ALT, ALKPHOS, BILITOT, ALBUMIN in the last 168 hours.  Cardiac Enzymes No results for input(s): TROPONINI, PROBNP in the last 168 hours.  Glucose Recent Labs  Lab 02/10/18 0001  GLUCAP 115*    Imaging Dg Chest 2 View  Result Date: 02/09/2018 CLINICAL DATA:  Shortness of breath and wheezing for several days. Smoker. Coronary artery disease. Chronic kidney disease stage 3. EXAM: CHEST - 2 VIEW COMPARISON:  03/18/2015 FINDINGS: Stable mild cardiomegaly. Mild diffuse interstitial infiltrates and Kerley B-lines are seen, consistent with mild interstitial edema. No evidence of pulmonary consolidation. Tiny posterior pleural thickening or effusion is again seen bilaterally, without significant change. IMPRESSION: Mild diffuse interstitial  infiltrates, suspicious for interstitial edema/congestive heart failure. Mild cardiomegaly. Electronically Signed   By: Earle Gell M.D.   On: 02/09/2018 13:24     STUDIES:  CXR 4/5 with cardiomegaly, no overt pulmonary edema  TTE Pending   CULTURES: None   ANTIBIOTICS: None   SIGNIFICANT EVENTS: Cardizem gtt 4/4-4/5 Neo-synephrine started 4/5  LINES/TUBES: PIV in RUE and LUE    ASSESSMENT / PLAN:  PULMONARY A: Dyspnea secondary to volume overload due to HF exacerbation - No over pulmonary edema on CXR, Lungs CTAB on exam and currently oxygenating well on room air   P:   - S/p IV Lasix 40 x1 - Hold further diuresis in the setting of hemodynamic instability  - Continue to monitor respiratory status   CARDIOVASCULAR A:  Afib/flutter with RVR - Currently rate controlled with HR 60s, but hypotensive  HFpEF EF 65% 03/2015 - Not on evidence based therapies and does not follow up with cardiology  CAD and NSTEMI s/p LAD DES in 5/ 2016  and LCx 04/2015 PAF last in 2013 P:  - Telemetry  - Agree with echocardiogram and cycling troponin  - S/p IV Lasix 40 mg x1. Hold further diuresis in the setting of hemodynamic instability. May resume when hemodynamically stable.  - Stop cardizem gtt and PO metoprolol in the setting of hypotension  - Consider amiodarone if converts to Afib/flutter with RVR again  - Start neo-synephrine - Continue heparin gtt   - Strict I/Os + daily weights  - Cardiology following   RENAL A:   Hypotension  Hx of HTN  CKD Stage 4 with baseline Cr ~1.9-2 - Renal function currently at baseline  P:   - Management as above  - Hold all diuresis and antihypertensive medications in the setting of hypotension. May resume when hemodynamically stable  - Monitor lytes and renal function   GASTROINTESTINAL A:   P:   - NPO   HEMATOLOGIC A:   On heparin gtt for Afib/flutter  P:  - Daily CBCs   INFECTIOUS A:   No signs/sysptoms suggestive of infection at  this time  P:     ENDOCRINE A:   None - no history of DM    P:     NEUROLOGIC A:   Chronic pain 2/2  Chronic L hip and knees s/p L THR 07/2014 and on chronic opiate therapy  P:   - Hold narcotics in the setting of hemodynamic stability. May resume home Percocet when hemodynamically stable.    Welford Roche, MD Internal Medicine, PGY-1 02/10/2018, 2:03 AM

## 2018-02-10 NOTE — ED Notes (Signed)
Patient remained hypotensive , admitting MD at bedside , 3rd bag NS 500 ml bolus infusing . IV sites intact , respirations unlabored .

## 2018-02-10 NOTE — Progress Notes (Signed)
Progress Note  Patient Name: Paul [name] Date of Encounter: 02/10/2018  Primary Cardiologist: Larae Grooms, MD   Subjective   Feels a little bit better this morning but still requiring supplemental O2. He denies CP. He admits that he has not been taking his medications. Has been off meds for "months". He also admits to eating a high salt diet. He smokes tobacco, ~1 pack per week and drinks ETOH "every now and then but not like I use to".   Inpatient Medications    Scheduled Meds: . atorvastatin  40 mg Oral q1800  . mouth rinse  15 mL Mouth Rinse BID   Continuous Infusions: . heparin 1,300 Units/hr (02/10/18 0600)  . phenylephrine (NEO-SYNEPHRINE) Adult infusion 70 mcg/min (02/10/18 0935)   PRN Meds: acetaminophen, morphine injection   Vital Signs    Vitals:   02/10/18 0845 02/10/18 0900 02/10/18 0915 02/10/18 0930  BP: 122/77 100/69 126/77 101/69  Pulse: (!) 41 (!) 40 61 63  Resp: (!) 34 (!) 23 (!) 29 (!) 23  Temp:      TempSrc:      SpO2: 99% 100% 100% 100%  Weight:      Height:        Intake/Output Summary (Last 24 hours) at 02/10/2018 0957 Last data filed at 02/10/2018 0900 Gross per 24 hour  Intake 2652.3 ml  Output 4920 ml  Net -2267.7 ml   Filed Weights   02/09/18 2300 02/10/18 0415  Weight: 197 lb 5 oz (89.5 kg) 193 lb 5.5 oz (87.7 kg)    Telemetry    Atrial flutter w/ variable VR- Personally Reviewed  ECG    Atrial flutter  - Personally Reviewed  Physical Exam   GEN: No acute distress.   Neck: No JVD Cardiac: irregular rhythm, mildly tachy rate, no murmurs, rubs, or gallops.  Respiratory: bibasilar crackles GI: Soft, nontender, non-distended  MS: 3+ bilateral pretibial edema; No deformity. Neuro:  Nonfocal  Psych: Normal affect   Labs    Chemistry Recent Labs  Lab 02/09/18 1351 02/10/18 0623  NA 144 141  K 4.6 4.6  CL 113* 110  CO2 23 16*  GLUCOSE 96 90  BUN 29* 33*  CREATININE 1.96* 2.61*  CALCIUM 8.6* 8.0*  PROT   --  5.7*  ALBUMIN  --  3.2*  AST  --  367*  ALT  --  281*  ALKPHOS  --  71  BILITOT  --  3.3*  GFRNONAA 36* 25*  GFRAA 41* 29*  ANIONGAP 8 15     Hematology Recent Labs  Lab 02/09/18 1351 02/10/18 0623  WBC 6.6 7.4  RBC 4.60 4.77  HGB 11.2* 11.9*  HCT 36.3* 37.6*  MCV 78.9 78.8  MCH 24.3* 24.9*  MCHC 30.9 31.6  RDW 14.5 14.8  PLT 196 170    Cardiac Enzymes Recent Labs  Lab 02/10/18 0321 02/10/18 0623  TROPONINI 0.11* 0.14*    Recent Labs  Lab 02/09/18 1412  TROPIPOC 0.06     BNP Recent Labs  Lab 02/09/18 1351  BNP 1,737.7*     DDimer No results for input(s): DDIMER in the last 168 hours.   Radiology    Dg Chest 2 View  Result Date: 02/09/2018 CLINICAL DATA:  Shortness of breath and wheezing for several days. Smoker. Coronary artery disease. Chronic kidney disease stage 3. EXAM: CHEST - 2 VIEW COMPARISON:  03/18/2015 FINDINGS: Stable mild cardiomegaly. Mild diffuse interstitial infiltrates and Kerley B-lines are seen, consistent with mild  interstitial edema. No evidence of pulmonary consolidation. Tiny posterior pleural thickening or effusion is again seen bilaterally, without significant change. IMPRESSION: Mild diffuse interstitial infiltrates, suspicious for interstitial edema/congestive heart failure. Mild cardiomegaly. Electronically Signed   By: Earle Gell M.D.   On: 02/09/2018 13:24   Dg Chest Portable 1 View  Result Date: 02/10/2018 CLINICAL DATA:  Chest pain EXAM: PORTABLE CHEST 1 VIEW COMPARISON:  Chest radiograph 02/09/2018 at 1:29 p.m. FINDINGS: Moderate cardiomegaly. Slightly improved aeration of the lungs. No focal consolidation. No pleural effusion or pneumothorax. IMPRESSION: Unchanged cardiomegaly with improved aeration of the lungs. No overt pulmonary edema. Electronically Signed   By: Ulyses Jarred M.D.   On: 02/10/2018 02:01    Cardiac Studies   LHC 03/19/2015 Procedures   Coronary Stent Intervention of LAD  Left Heart Cath and  Coronary Angiography  Conclusion    Prox RCA lesion, 20% stenosed.  RPDA lesion, 60% stenosed.  Mid Cx lesion, 90% stenosed.  Ost 1st Diag to 1st Diag lesion, 60% stenosed.  Ost LAD to Prox LAD lesion, 95% stenosed. There is a 0% residual stenosis post intervention.  A drug-eluting stent was placed.   Final Conclusions:  1. Severe two-vessel coronary artery disease. 2. Moderately elevated left ventricular end-diastolic pressure. 3. Successful angioplasty and drug-eluting stent placement to the proximal LAD.    Staged Coronary Intervention of Lcx 04/08/18 Procedures   Coronary Stent Intervention  Conclusion    Prox RCA lesion, 20% stenosed.  RPDA lesion, 60% stenosed.  1st Diag lesion, 60% stenosed.  Mid Cx lesion, 90% stenosed. There is a 0% residual stenosis post intervention.  A drug-eluting stent was placed.   1. Patent LAD stent  2. Successful angioplasty and drug-eluting stent placement to the mid left circumflex.   2D Echo 03/2015   Study Conclusions  - Left ventricle: Severe LV wall thickening with basal septal   hypertrophy - measuring 2.0 cm. There is a bright, speckled   appearance of the intraventricular septum which may be consistent   with infiltrative disease such as amyloidosis. No LVOT   obstruction is noted. Systolic function was vigorous. The   estimated ejection fraction was in the range of 65% to 70%. Wall   motion was normal; there were no regional wall motion   abnormalities. Doppler parameters are consistent with pseudormal   left ventricular relaxation (grade 2 diastolic dysfunction). The   E/A ratio is 2.5. The E/e&' ratio is >15, suggesting elevated LV   filling pressure. - Aortic valve: Sclerosis without stenosis. Transvalvular velocity   was minimally increased. There was trivial regurgitation. Valve   area (VTI): 3.14 cm^2. Valve area (Vmax): 3.11 cm^2. Valve area   (Vmean): 3.21 cm^2. - Mitral valve: There was mild  regurgitation. - Left atrium: Massively dilated at 77 ml/m2. - Right atrium: Massively dilated at 48 ml/m2. - Atrial septum: Aneurysmal IAS - PFO cannot be excluded. - Tricuspid valve: There was moderate regurgitation. - Pulmonary arteries: PA peak pressure: 56 mm Hg (S). - Inferior vena cava: The vessel was dilated. The respirophasic   diameter changes were blunted (< 50%), consistent with elevated   central venous pressure.  Impressions:  - Compared to the prior study in 2013, the LV wall thickness has   progressed - there is a suggestion of infiltrative   cardiomyopathy. Consider cardiac MRI to evaluate further. There   is now severe biatrial enlargment. There is pseudonormal   diastolic dysfunction with high LV filling pressure. RA pressure  is also elevated. The interatrial septum is aneurysmal and a PFO   cannot be excluded.  2D Echo 02/10/18 - pending  Patient Profile     Paul [name] is a 57 y.o. male who is being seen today for the evaluation of atrial flutter w/ RVR at the request of Family Medicine service. Pt has h/o CAD, s/p PCI in 2016 to LAD and LCx, CKD stage3, h/o severe LVH and hyperdynamic LV function on TTE back in 2016 w/ question of possible infiltrative CM with no subsequent workup  He was seen at PCP office yesterday with SOB on minimal exertion and bilateral LEE x 1 week + 2 pillow orthopnea. EKG was concerning for SVT and pt sent to the ED. EKG showed atrial fibrillation with 2:1 conduction.   Assessment & Plan    1. Atrial Flutter: new diagnosis. TSH WNL. Both K and Mg WNL. Troponin mildly elevated at 0.11>>0.14 but no recent CP. 2D echo pending. On echo in 2016, severe biatrial enlargement noted, thus likely would not be a good ablation candidate. Resting HR currently in the 90s-low 100s. Currently not on anything for rate control given significant hypotension, requiring pressor support. Currently on IV heparin for a/c. CHA2DS2 VASc score is at least 3  for CHF, HTN and vascular disease. This patients CHA2DS2-VASc Score and unadjusted Ischemic Stroke Rate (% per year) is equal to 3.2 % stroke rate/year from a score of 3. Recommend long term oral anticoagulation, for stroke prophylaxis, but will wait until we are sure no invasive interventions are needed. Based on renal function, most likely will need coumadin, however there are concerns regarding med compliance.  Continue to monitor rates closely on tele. He will likely need TEE DCCV to restore NSR.    2. Acute CHF: Last echo in 2016 showed normal LVEF at 65-70% with G2DD and speckled appearance of the intraventricular septum, concerning for infiltrative disease/ amyloidosis. Cardiac MRI recommended but no subsequent w/u done. Repeat 2D echo has been completed and results pending. We will reassess LV systolic and diastolic function. Consider cMRI for further evaluation as well, if similar findings on repeat study. His BNP on admit was markedly elevated at 1,737. TSH WNL. Admit SCr 1.96. IV Lasix given with good UOP. -4.9 L out. Net I/Os negative 2.4 L. Bump in SCr to now 2.61. BUN 33. Some of his worsening renal function may be prerenal AKI 2/2 hypotension. K stable at 4.6. He remains volume overloaded with bibasilar crackles and 3+ bilateral pretibial edema on exam. He needs additional diureses with IV Lasix and may required continued use of phenylephrine to help keep pressures stable. Monitor renal function closely. Low salt diet. Will add BB, once euvolemic, if BP allows.    3. CAD: s/p PCI + stenting to proximal LAD and mid LCx in 2016. He denies any recent CP. Troponins are mildly elevated, but not at the level of his NSTEMI in 2016. 2D echo pending.   4. HLD: last lipid panel 07/2017 showed elevated LDL at 89 mg/dL. Goal, in the setting of CAD is < 70 mg/dL. Pt has been noncompliant with meds. Lipitor 40 mg restarted. Recheck FLP and HFTs in 6-8 weeks.   5. Hypotension: Pt required initiation of  phenylephrine for BP support after systolic BP dropped into the 40s. BP improved and stable w/ most recent documented BP 100/69. Continue to monitor closely. This may also explain his bump in SCr.    6. Elevated Troponin: mildly elevated at 0.11>>0.14. Elevations  were much higher in 2016, when noted to have obstructive 2VD, requiring PCI to LAD and LCx.  Troponin levels in 2016 peaked at 10.33. Suspect recent troponin elevation likely represents demand ischemia, in the setting of acute CHF and rapid atrial flutter. We will await echo report to determine need for further evaluation. If significant change in LVEF or new WNL, may consider noninvasive NST to assess for coronary ischemia, given his abnormal renal function.   7. Acute on Chronic Kidney Disease: admit SCr 1.9. Bump in level to 2.6 after IV Lasix but also episode of significant hypotension, requiring pressor support. Suspect some of his AKI is prerenal from hypotension. His baseline appears to be ~1.9-2.2. K is stable. Will monitor closely.   8. Med Noncompliance: we had a discussion on the importance of strict med compliance and clinic f/u as advised given his multiple comorbidities   9. Tobacco Abuse: per pt report, he smokes cigarettes, ~1 pack per week. Smoking cessation advised.     For questions or updates, please contact [place] Please consult [email or web address] for contact info under Cardiology/STEMI.      Signed, Lyda Jester, PA-C  02/10/2018, 9:57 AM

## 2018-02-10 NOTE — ED Notes (Signed)
istat trop dropped off at mini lab

## 2018-02-10 NOTE — Progress Notes (Signed)
Pharmacy Antibiotic Note  Paul [name] is a 57 y.o. male admitted on 02/09/2018 with SOB and edema thought to be due to HF exacerbation however now concerned for PNA/sepsis given hypotension.  Pharmacy has been consulted for Zosyn dosing.  The patient has underlying CKD with baseline SCr that appears to be around 2, with some degree of AKI this admission with SCr 2.61, CrCl~30-35 ml/min. Noted MRSA PCR neg - discussed with CCM and holding off on Vancomycin for now.   Plan: 1. Zosyn 3.375g IV every 8 hours (infused over 4 hours) 2. Will continue to follow renal function, culture results, LOT, and antibiotic de-escalation plans   Height: 6' (182.9 cm) Weight: 193 lb 5.5 oz (87.7 kg) IBW/kg (Calculated) : 77.6  Temp (24hrs), Avg:97.7 F (36.5 C), Min:96.4 F (35.8 C), Max:98.6 F (37 C)  Recent Labs  Lab 02/09/18 1351 02/10/18 0623  WBC 6.6 7.4  CREATININE 1.96* 2.61*  LATICACIDVEN  --  2.2*    Estimated Creatinine Clearance: 33.4 mL/min (A) (by C-G formula based on SCr of 2.61 mg/dL (H)).    Allergies  Allergen Reactions  . Ibuprofen Other (See Comments)    Kidney damage  . Nsaids     REACTION: nsaid induced nephropathy    Antimicrobials this admission: Zosyn 4/5 >>  Dose adjustments this admission: n/a  Microbiology results: 4/5 MRSA PCR >> neg  Thank you for allowing pharmacy to be a part of this patient's care.  Alycia Rossetti, PharmD, BCPS Clinical Pharmacist Pager: [phone] Clinical phone for 02/10/2018 from 7a-3:30p: [phone] If after 3:30p, please call main pharmacy at: [phone] 02/10/2018 3:11 PM

## 2018-02-10 NOTE — ED Notes (Signed)
NS IV bolus infusing , alert and oriented , hypotensive , admitting MD and cardiologist at bedside .

## 2018-02-10 NOTE — Progress Notes (Addendum)
I was asked by RN to evaluate this patient as he "looks bad." This patient is familiar to me as I admitted him this AM. Hs is a 13yoM with hx CAD, CKD, GERD, HTN, Afib, and HOCM, who presented with CHF exac and Aflutter with RVR who then became hypotensive after being treated with dilt and metoprolol. He was admitted to the ICU early this AM on Neo gtt. At that time he was complaining of back pain from laying on the ER stretcher for 12 hours. He was treated for his back pain, but now he says he began having abdominal pain at lunch today. He describes the abdominal pain as diffuse constant severe. He reports N/V x 1 episode. Patient reports possible diarrhea but says "I don't know. I don't look at it." His RN reports patient's stools have been soft but not diarrhea; they are light brown not bloody. Labs show patient's AKI-on-CKD continues to worsen and he had developed transaminitis with elevated AST, ALT, and Tbili. On exam abdomen is diffusely TTP with guarding. Rebound present in epigastric and LLQ. BS absent. However patient seems a little anxious which may be playing a part in his guarding. He denies CP. He admits to SOB but also says it is no worse than on admission. Review of labs also show lactic acid continues to worsen. Discussed with patient this could be a sign of infection vs tissue ischemia (mesenteric ischemia?) vs less likely dehydration. Will continue to trend lactate. Low procal of 0.17 makes infection less likely. Continue empiric Zosyn. ABG shows metabolic acidosis; will remove patient from BIPAP and place on nasal cannula. Give 1am Bicarb IV. Repeat ABG in 1hr. UA and Urine lytes ordered this AM have still not been collected; RN reports only 400cc UOP in 12hrs today. Will place Foley catheter now.   60 minutes critical care time  Vernie Murders, MD Pulmonary & Critical Care Medicine Pager: [phone]

## 2018-02-10 NOTE — ED Notes (Addendum)
Pt called out reporting feeling lightheaded post blood draw. Pt's BP dropped to 72/53. MD Tamala Julian notified. 250 bolus started per MD Tamala Julian. RN aware.

## 2018-02-10 NOTE — ED Notes (Signed)
MD at bedside. 

## 2018-02-11 ENCOUNTER — Inpatient Hospital Stay (HOSPITAL_COMMUNITY): Payer: Medicare HMO

## 2018-02-11 DIAGNOSIS — R1084 Generalized abdominal pain: Secondary | ICD-10-CM

## 2018-02-11 DIAGNOSIS — R945 Abnormal results of liver function studies: Secondary | ICD-10-CM

## 2018-02-11 DIAGNOSIS — I5043 Acute on chronic combined systolic (congestive) and diastolic (congestive) heart failure: Secondary | ICD-10-CM

## 2018-02-11 DIAGNOSIS — I483 Typical atrial flutter: Secondary | ICD-10-CM

## 2018-02-11 LAB — BLOOD CULTURE ID PANEL (REFLEXED)
ACINETOBACTER BAUMANNII: NOT DETECTED
CANDIDA ALBICANS: NOT DETECTED
CANDIDA PARAPSILOSIS: NOT DETECTED
Candida glabrata: NOT DETECTED
Candida krusei: NOT DETECTED
Candida tropicalis: NOT DETECTED
Carbapenem resistance: NOT DETECTED
ENTEROBACTER CLOACAE COMPLEX: NOT DETECTED
ENTEROBACTERIACEAE SPECIES: NOT DETECTED
Enterococcus species: NOT DETECTED
Escherichia coli: NOT DETECTED
HAEMOPHILUS INFLUENZAE: NOT DETECTED
KLEBSIELLA OXYTOCA: NOT DETECTED
KLEBSIELLA PNEUMONIAE: NOT DETECTED
Listeria monocytogenes: NOT DETECTED
METHICILLIN RESISTANCE: NOT DETECTED
NEISSERIA MENINGITIDIS: NOT DETECTED
PSEUDOMONAS AERUGINOSA: NOT DETECTED
Proteus species: NOT DETECTED
STAPHYLOCOCCUS AUREUS BCID: NOT DETECTED
STREPTOCOCCUS AGALACTIAE: NOT DETECTED
STREPTOCOCCUS PYOGENES: NOT DETECTED
Serratia marcescens: NOT DETECTED
Staphylococcus species: DETECTED — AB
Streptococcus pneumoniae: NOT DETECTED
Streptococcus species: NOT DETECTED

## 2018-02-11 LAB — BASIC METABOLIC PANEL
ANION GAP: 17 — AB (ref 5–15)
ANION GAP: 13 (ref 5–15)
BUN: 44 mg/dL — ABNORMAL HIGH (ref 6–20)
BUN: 51 mg/dL — AB (ref 6–20)
CALCIUM: 8.3 mg/dL — AB (ref 8.9–10.3)
CALCIUM: 8 mg/dL — AB (ref 8.9–10.3)
CHLORIDE: 108 mmol/L (ref 101–111)
CO2: 19 mmol/L — ABNORMAL LOW (ref 22–32)
CO2: 22 mmol/L (ref 22–32)
CREATININE: 3.44 mg/dL — AB (ref 0.61–1.24)
CREATININE: 3.63 mg/dL — AB (ref 0.61–1.24)
Chloride: 106 mmol/L (ref 101–111)
GFR calc Af Amer: 20 mL/min — ABNORMAL LOW (ref >60)
GFR calc non Af Amer: 18 mL/min — ABNORMAL LOW (ref >60)
GFR, EST AFRICAN AMERICAN: 21 mL/min — AB (ref >60)
GFR, EST NON AFRICAN AMERICAN: 17 mL/min — AB (ref >60)
GLUCOSE: 120 mg/dL — AB (ref 65–99)
Glucose, Bld: 102 mg/dL — ABNORMAL HIGH (ref 65–99)
Potassium: 5.6 mmol/L — ABNORMAL HIGH (ref 3.5–5.1)
Potassium: 4.9 mmol/L (ref 3.5–5.1)
SODIUM: 144 mmol/L (ref 135–145)
Sodium: 141 mmol/L (ref 135–145)

## 2018-02-11 LAB — BLOOD GAS, ARTERIAL
ACID-BASE DEFICIT: 4.2 mmol/L — AB (ref 0.0–2.0)
Acid-base deficit: 7.6 mmol/L — ABNORMAL HIGH (ref 0.0–2.0)
BICARBONATE: 17.7 mmol/L — AB (ref 20.0–28.0)
Bicarbonate: 21 mmol/L (ref 20.0–28.0)
DRAWN BY: 244871
Drawn by: 24487
O2 CONTENT: 2 L/min
O2 Content: 2 L/min
O2 SAT: 94.8 %
O2 Saturation: 96.4 %
PATIENT TEMPERATURE: 98.6
PCO2 ART: 43.1 mmHg (ref 32.0–48.0)
PH ART: 7.309 — AB (ref 7.350–7.450)
PO2 ART: 90 mmHg (ref 83.0–108.0)
Patient temperature: 98.6
pCO2 arterial: 37.8 mmHg (ref 32.0–48.0)
pH, Arterial: 7.294 — ABNORMAL LOW (ref 7.350–7.450)
pO2, Arterial: 108 mmHg (ref 83.0–108.0)

## 2018-02-11 LAB — CBC WITH DIFFERENTIAL/PLATELET
BASOS ABS: 0 10*3/uL (ref 0.0–0.1)
BASOS PCT: 0 %
Eosinophils Absolute: 0 10*3/uL (ref 0.0–0.7)
Eosinophils Relative: 0 %
HEMATOCRIT: 38.6 % — AB (ref 39.0–52.0)
HEMOGLOBIN: 11.9 g/dL — AB (ref 13.0–17.0)
Lymphocytes Relative: 12 %
Lymphs Abs: 1.3 10*3/uL (ref 0.7–4.0)
MCH: 24.7 pg — ABNORMAL LOW (ref 26.0–34.0)
MCHC: 30.8 g/dL (ref 30.0–36.0)
MCV: 80.1 fL (ref 78.0–100.0)
Monocytes Absolute: 0.7 10*3/uL (ref 0.1–1.0)
Monocytes Relative: 7 %
NEUTROS ABS: 8.3 10*3/uL — AB (ref 1.7–7.7)
NEUTROS PCT: 81 %
Platelets: 154 10*3/uL (ref 150–400)
RBC: 4.82 MIL/uL (ref 4.22–5.81)
RDW: 14.7 % (ref 11.5–15.5)
WBC: 10.3 10*3/uL (ref 4.0–10.5)

## 2018-02-11 LAB — LACTIC ACID, PLASMA
LACTIC ACID, VENOUS: 2 mmol/L — AB (ref 0.5–1.9)
LACTIC ACID, VENOUS: 4.3 mmol/L — AB (ref 0.5–1.9)
Lactic Acid, Venous: 2.9 mmol/L (ref 0.5–1.9)
Lactic Acid, Venous: 2.1 mmol/L (ref 0.5–1.9)
Lactic Acid, Venous: 1.9 mmol/L (ref 0.5–1.9)

## 2018-02-11 LAB — RENAL FUNCTION PANEL
ALBUMIN: 3.3 g/dL — AB (ref 3.5–5.0)
Anion gap: 17 — ABNORMAL HIGH (ref 5–15)
BUN: 43 mg/dL — AB (ref 6–20)
CALCIUM: 8.2 mg/dL — AB (ref 8.9–10.3)
CO2: 19 mmol/L — ABNORMAL LOW (ref 22–32)
Chloride: 108 mmol/L (ref 101–111)
Creatinine, Ser: 3.39 mg/dL — ABNORMAL HIGH (ref 0.61–1.24)
GFR calc Af Amer: 21 mL/min — ABNORMAL LOW (ref >60)
GFR, EST NON AFRICAN AMERICAN: 18 mL/min — AB (ref >60)
Glucose, Bld: 120 mg/dL — ABNORMAL HIGH (ref 65–99)
PHOSPHORUS: 6 mg/dL — AB (ref 2.5–4.6)
Potassium: 5.5 mmol/L — ABNORMAL HIGH (ref 3.5–5.1)
SODIUM: 144 mmol/L (ref 135–145)

## 2018-02-11 LAB — CBC
HEMATOCRIT: 36.5 % — AB (ref 39.0–52.0)
HEMOGLOBIN: 11.6 g/dL — AB (ref 13.0–17.0)
MCH: 25.2 pg — AB (ref 26.0–34.0)
MCHC: 31.8 g/dL (ref 30.0–36.0)
MCV: 79.2 fL (ref 78.0–100.0)
Platelets: 168 10*3/uL (ref 150–400)
RBC: 4.61 MIL/uL (ref 4.22–5.81)
RDW: 14.7 % (ref 11.5–15.5)
WBC: 11 10*3/uL — ABNORMAL HIGH (ref 4.0–10.5)

## 2018-02-11 LAB — HEPARIN LEVEL (UNFRACTIONATED)
HEPARIN UNFRACTIONATED: 0.18 [IU]/mL — AB (ref 0.30–0.70)
HEPARIN UNFRACTIONATED: 0.37 [IU]/mL (ref 0.30–0.70)
Heparin Unfractionated: 0.35 IU/mL (ref 0.30–0.70)

## 2018-02-11 LAB — HEPATIC FUNCTION PANEL
ALT: 1111 U/L — ABNORMAL HIGH (ref 17–63)
AST: 1215 U/L — ABNORMAL HIGH (ref 15–41)
Albumin: 3.6 g/dL (ref 3.5–5.0)
Alkaline Phosphatase: 79 U/L (ref 38–126)
BILIRUBIN DIRECT: 0.8 mg/dL — AB (ref 0.1–0.5)
BILIRUBIN INDIRECT: 2.6 mg/dL — AB (ref 0.3–0.9)
Total Bilirubin: 3.4 mg/dL — ABNORMAL HIGH (ref 0.3–1.2)
Total Protein: 6.3 g/dL — ABNORMAL LOW (ref 6.5–8.1)

## 2018-02-11 LAB — TROPONIN I: TROPONIN I: 0.25 ng/mL — AB (ref <0.03)

## 2018-02-11 LAB — GLUCOSE, CAPILLARY: Glucose-Capillary: 100 mg/dL — ABNORMAL HIGH (ref 65–99)

## 2018-02-11 LAB — MAGNESIUM
MAGNESIUM: 1.9 mg/dL (ref 1.7–2.4)
Magnesium: 1.9 mg/dL (ref 1.7–2.4)

## 2018-02-11 LAB — PROCALCITONIN: Procalcitonin: 5.61 ng/mL

## 2018-02-11 LAB — PHOSPHORUS: PHOSPHORUS: 6 mg/dL — AB (ref 2.5–4.6)

## 2018-02-11 MED ORDER — METOPROLOL TARTRATE 25 MG PO TABS
25.0000 mg | ORAL_TABLET | Freq: Four times a day (QID) | ORAL | Status: DC
  Administered 2018-02-11 – 2018-02-12 (×4): 25 mg via ORAL
  Filled 2018-02-11 (×4): qty 1

## 2018-02-11 MED ORDER — SODIUM BICARBONATE 8.4 % IV SOLN
50.0000 meq | Freq: Once | INTRAVENOUS | Status: AC
  Administered 2018-02-11: 50 meq via INTRAVENOUS
  Filled 2018-02-11: qty 50

## 2018-02-11 MED ORDER — MORPHINE SULFATE (PF) 4 MG/ML IV SOLN
1.0000 mg | INTRAVENOUS | Status: AC | PRN
  Administered 2018-02-11 – 2018-02-12 (×3): 1 mg via INTRAVENOUS
  Filled 2018-02-11 (×3): qty 1

## 2018-02-11 MED ORDER — TECHNETIUM TC 99M MEBROFENIN IV KIT
7.7900 | PACK | Freq: Once | INTRAVENOUS | Status: AC | PRN
  Administered 2018-02-11: 7.79 via INTRAVENOUS

## 2018-02-11 MED ORDER — SODIUM BICARBONATE 8.4 % IV SOLN
INTRAVENOUS | Status: DC
  Administered 2018-02-11 – 2018-02-12 (×3): via INTRAVENOUS
  Filled 2018-02-11 (×3): qty 850

## 2018-02-11 NOTE — Consult Note (Signed)
ELECTROPHYSIOLOGY CONSULT NOTE      Patient ID: Paul [name] MRN: [id number], DOB/AGE: [birth year] 57 y.o.  Admit date: 02/09/2018 Date of Consult: 02/11/2018  Primary Physician: Rogue Bussing, MD Primary Cardiologist: Dr Irish Lack  Patient Profile: Paul [name] is a 57 y.o. male with a history of severe biatrial enlargement, CAD s/p PCI, and HTN who is being seen today for the evaluation of typical atrial flutter at the request of Dr Sallyanne Kuster.  HPI:  Paul [name] is a 57 y.o. male admitted 02/09/18 with shortness of breath and edema.  He was noted to have atrial flutter with RVR.  Rate control was limited initially by hypotension.  He received IVF and was admitted.  He was also noted to have lactic acidosis with worsening transaminitis of unclear origin.  RUQ US revealed cholelithiasis and diffuse GB thickening.  A HIDA scan has been performed which was normal.  He also presents with acute on chronic renal insufficiency.  Initially severe abdominal pain is now"much better" per the patient.  Echo reveals EF 30-35%.  He has volume overload on exam.  He admits to noncompliance with medicines and diet.  He denies chest pain, palpitations dizziness, syncope, edema, weight gain, or early satiety.  Past Medical History:  Diagnosis Date  . Arthritis   . CAD (coronary artery disease)    a. 03/2015 NSTEMI/PCI: LM nl, LAD 95p (3.5x20 Promus DES), D1 60, D2/3 mild dzs, LCX 9m (plan for staged pci), OM1/2 min irregs, RCA 20p, RPDA 60.  Marland Kitchen CKD (chronic kidney disease), stage III ([place])   . GERD (gastroesophageal reflux disease)    occ  . HOCM (hypertrophic obstructive cardiomyopathy) ([place])    a. 03/2015 Echo: EF 65-60%, sev LV thickening w/ bright, speckled appearance of IV septum, Gr 2 DD, AoV sclerosis, triv AI, mild MR, LA 78ml/m^2, RA 9ml/m^2, aneurysmal IAS, mod TR, PASP 55mmHg.  Marland Kitchen Hypertension    denies  . PAF (paroxysmal atrial fibrillation) ([place])    remote isolated  episode - 09/2012.  . TMJ arthralgia      Surgical History:  Past Surgical History:  Procedure Laterality Date  . APPENDECTOMY    . CARDIAC CATHETERIZATION N/A 03/19/2015   Procedure: Left Heart Cath and Coronary Angiography;  Surgeon: [name], MD;  Location: [facility];  Service: Cardiovascular;  Laterality: N/A;  . CARDIAC CATHETERIZATION  03/19/2015   Procedure: Coronary Stent Intervention;  Surgeon: [name], MD;  Location: [facility];  Service: Cardiovascular;;  . CARDIAC CATHETERIZATION N/A 04/09/2015   Procedure: Coronary Stent Intervention;  Surgeon: [name], MD;  Location: [facility];  Service: Cardiovascular;  Laterality: N/A;  . HIP SURGERY Left 74   lft   . LAPAROSCOPIC APPENDECTOMY  09/22/2012   Procedure: APPENDECTOMY LAPAROSCOPIC;  Surgeon: Harl Bowie, MD;  Location: [facility];  Service: General;;  . LAPAROSCOPY  09/22/2012   Procedure: LAPAROSCOPY DIAGNOSTIC;  Surgeon: Harl Bowie, MD;  Location: [facility];  Service: General;  Laterality: N/A;  . TOTAL HIP ARTHROPLASTY Right 03,01   x2  . TOTAL HIP ARTHROPLASTY WITH HARDWARE REMOVAL Left 08/05/2014   Procedure: TOTAL HIP ARTHROPLASTY WITH HARDWARE REMOVAL;  Surgeon: Kerin Salen, MD;  Location: [facility];  Service: Orthopedics;  Laterality: Left;     Medications Prior to Admission  Medication Sig Dispense Refill Last Dose  . sildenafil (VIAGRA) 100 MG tablet Take 1 tablet (100 mg total) by mouth daily as  needed for erectile dysfunction. 30 min-4 hrs before sexual activity. Do not use with nitro. 90 tablet 0 Past Month at Unknown time  . oxyCODONE-acetaminophen (PERCOCET/ROXICET) 5-325 MG tablet Take 1 tablet by mouth as needed.   Not Taking at Unknown time    Inpatient Medications:  . mouth rinse  15 mL Mouth Rinse BID  . pantoprazole (PROTONIX) IV  40 mg Intravenous Q24H  . sucralfate  1 g Oral TID WC & HS    Allergies:  Allergies  Allergen Reactions  .  Ibuprofen Other (See Comments)    Kidney damage  . Nsaids     REACTION: nsaid induced nephropathy    Social History   Socioeconomic History  . Marital status: Divorced    Spouse name: Not on file  . Number of children: Not on file  . Years of education: Not on file  . Highest education level: Not on file  Occupational History  . Not on file  Social Needs  . Financial resource strain: Not on file  . Food insecurity:    Worry: Not on file    Inability: Not on file  . Transportation needs:    Medical: Not on file    Non-medical: Not on file  Tobacco Use  . Smoking status: Light Tobacco Smoker    Packs/day: 0.25    Years: 10.00    Pack years: 2.50    Types: Cigarettes  . Smokeless tobacco: Never Used  Substance and Sexual Activity  . Alcohol use: Yes    Alcohol/week: 1.2 oz    Types: 2 Cans of beer per week    Comment: occasional  . Drug use: No  . Sexual activity: Not on file  Lifestyle  . Physical activity:    Days per week: Not on file    Minutes per session: Not on file  . Stress: Not on file  Relationships  . Social connections:    Talks on phone: Not on file    Gets together: Not on file    Attends religious service: Not on file    Active member of club or organization: Not on file    Attends meetings of clubs or organizations: Not on file    Relationship status: Not on file  . Intimate partner violence:    Fear of current or ex partner: Not on file    Emotionally abused: Not on file    Physically abused: Not on file    Forced sexual activity: Not on file  Other Topics Concern  . Not on file  Social History Narrative  . Not on file     Family History  Problem Relation Age of Onset  . Heart disease Mother   . Heart disease Father      Review of Systems: All other systems reviewed and are otherwise negative except as noted above.  Physical Exam: Vitals:   02/11/18 1100 02/11/18 1200 02/11/18 1300 02/11/18 1400  BP: 99/83 (!) 126/99 115/81  99/76  Pulse: (!) 111 (!) 111 (!) 113 (!) 112  Resp: (!) $RemoveB'26 19 16 18  'CzjdVlau$ Temp:      TempSrc:      SpO2: 99% 100% 99% 99%  Weight:      Height:        GEN- The patient is well appearing, alert and oriented x 3 today.   HEENT: normocephalic, atraumatic; sclera clear, conjunctiva pink; hearing intact; oropharynx clear; neck supple Lungs- Clear to ausculation bilaterally, normal work of breathing.  No  wheezes, rales, rhonchi Heart- tachycardic irregular rhythm GI- soft, mildly tender (diffuse), hyperactive BS Extremities- no clubbing, cyanosis, or edema; DP/PT/radial pulses 2+ bilaterally MS- no significant deformity or atrophy Skin- warm and dry, no rash or lesion Psych- euthymic mood, full affect Neuro- strength and sensation are intact  Labs:   Lab Results  Component Value Date   WBC 11.0 (H) 02/11/2018   HGB 11.6 (L) 02/11/2018   HCT 36.5 (L) 02/11/2018   MCV 79.2 02/11/2018   PLT 168 02/11/2018    Recent Labs  Lab 02/10/18 2350 02/11/18 0200  NA  --  144  144  K  --  5.6*  5.5*  CL  --  108  108  CO2  --  19*  19*  BUN  --  44*  43*  CREATININE  --  3.44*  3.39*  CALCIUM  --  8.3*  8.2*  PROT 6.3*  --   BILITOT 3.4*  --   ALKPHOS 79  --   ALT 1,111*  --   AST 1,215*  --   GLUCOSE  --  102*  120*      Radiology/Studies: Dg Chest 2 View  Result Date: 02/09/2018 CLINICAL DATA:  Shortness of breath and wheezing for several days. Smoker. Coronary artery disease. Chronic kidney disease stage 3. EXAM: CHEST - 2 VIEW COMPARISON:  03/18/2015 FINDINGS: Stable mild cardiomegaly. Mild diffuse interstitial infiltrates and Kerley B-lines are seen, consistent with mild interstitial edema. No evidence of pulmonary consolidation. Tiny posterior pleural thickening or effusion is again seen bilaterally, without significant change. IMPRESSION: Mild diffuse interstitial infiltrates, suspicious for interstitial edema/congestive heart failure. Mild cardiomegaly. Electronically  Signed   By: Earle Gell M.D.   On: 02/09/2018 13:24   Dg Chest Port 1 View  Result Date: 02/11/2018 CLINICAL DATA:  Respiratory failure EXAM: PORTABLE CHEST 1 VIEW COMPARISON:  02/10/2018 FINDINGS: Cardiac shadow is enlarged but stable. The lungs are well aerated bilaterally. Mild vascular congestion remains. Persistent changes in the left base are noted. Some improved aeration in the right base is noted. No bony abnormality is seen. IMPRESSION: Stable vascular congestion. Improving aeration in the right base with stable infiltrates in the left base. Electronically Signed   By: Inez Catalina M.D.   On: 02/11/2018 07:01   Dg Chest Port 1 View  Result Date: 02/10/2018 CLINICAL DATA:  Shortness of breath. EXAM: PORTABLE CHEST 1 VIEW COMPARISON:  Radiograph of December 13, 2017. FINDINGS: Stable cardiomegaly and central pulmonary vascular congestion is noted. No pneumothorax is noted. Increased bibasilar opacities are noted concerning for edema or atelectasis with possible associated pleural effusions. Bony thorax is unremarkable. IMPRESSION: Stable cardiomegaly and central pulmonary vascular congestion. Increased bibasilar densities are noted concerning for edema or atelectasis with possible associated pleural effusions. Electronically Signed   By: Marijo Conception, M.D.   On: 02/10/2018 14:50   Dg Chest Portable 1 View  Result Date: 02/10/2018 CLINICAL DATA:  Chest pain EXAM: PORTABLE CHEST 1 VIEW COMPARISON:  Chest radiograph 02/09/2018 at 1:29 p.m. FINDINGS: Moderate cardiomegaly. Slightly improved aeration of the lungs. No focal consolidation. No pleural effusion or pneumothorax. IMPRESSION: Unchanged cardiomegaly with improved aeration of the lungs. No overt pulmonary edema. Electronically Signed   By: Ulyses Jarred M.D.   On: 02/10/2018 02:01   Dg Abd Portable 1v  Result Date: 02/10/2018 CLINICAL DATA:  Acute epigastric abdominal pain. EXAM: PORTABLE ABDOMEN - 1 VIEW COMPARISON:  Radiographs of October 03, 2012. FINDINGS: The bowel gas  pattern is normal. Phleboliths are noted in the pelvis. Status post bilateral hip arthroplasties. IMPRESSION: No evidence of bowel obstruction or ileus. Electronically Signed   By: Marijo Conception, M.D.   On: 02/10/2018 14:51   US Abdomen Limited Ruq  Result Date: 02/10/2018 CLINICAL DATA:  Abdominal pain for 1 week. Elevated liver function studies and lactic acid. EXAM: ABDOMEN ULTRASOUND COMPLETE COMPARISON:  None. FINDINGS: Gallbladder: Cholelithiasis with 8 mm stone in the dependent gallbladder. Diffuse gallbladder wall thickening measuring up to 7 mm in diameter. Pericholecystic edema. Murphy's sign is negative. Common bile duct: Diameter: 4 mm, normal Liver: Small hyperechoic focal liver lesion in the right lobe measuring 1.2 cm diameter, likely cavernous hemangioma. Small amount of ascites demonstrated around the liver. Portal vein is patent on color Doppler imaging with normal direction of blood flow towards the liver. IVC: No abnormality visualized. Pancreas: Visualized portion unremarkable. Spleen: Size and appearance within normal limits. Right Kidney: Length: 11 cm. Echogenicity within normal limits. No mass or hydronephrosis visualized. Left Kidney: Length: 10.3 cm. Echogenicity within normal limits. No mass or hydronephrosis visualized. Abdominal aorta: No aneurysm visualized. Other findings: Small bilateral pleural effusions. IMPRESSION: 1. Cholelithiasis with diffuse gallbladder wall thickening and pericholecystic edema. Murphy's sign is negative. Indeterminate for cholecystitis but gallbladder wall thickening and edema could be related to liver disease and ascites. 2. Small amount of upper abdominal ascites. 3. Focal lesion in the right lobe of the liver consistent with cavernous hemangioma. 4. Small bilateral pleural effusions. Electronically Signed   By: Lucienne Capers M.D.   On: 02/10/2018 23:50   Echo 02/10/18 reveals EF 30-35% with moderate concentric and  severe asymmetric hypetrophy.  There is a speckled appearance to myocardiac which has raised suspicion for amyloidosis,. Moderate MR, massive biatrial enlargement, severe RV enlargement, moderate TR, PAS 47 mm Hg,   GNF:[id number] from yesterday reveal typical atrial flutter with 2:1 AV conduction (personally reviewed)  TELEMETRY: atrial flutter, V rates 120s (personally reviewed)   Assessment/Plan:  1.  Typical atrial flutter The patient has symptomatic typical appearing atrial flutter with RVR.  V rates are elevated.  This is the likely cause for his acute decompensation/ SOB.  He has been placed on amiodarone for rate control.  Given transaminitis, I will stop amiodarone at this time.  Start metoprolol $RemoveBeforeDE'25mg'hIGIzxQLKpahTeR$  qh which can be titrated.  Continue IV heparin.  IF creatinine does not improve, then will need to convert to coumadin in the next day or so.  Would plan TEE guided cardioversion next week.  This will also allow better visualization of his heart given significant abnormality on 2D echo.  In the setting of his acute medical illness, I would not advise ablation. Could consider restarting amiodarone in the future should his LFTs improve. Of note, he has severe biatrial enlargement and prior afib in 2013.  I think that our ability to maintain sinus rhythm long term may be low.  2. Acute systolic dysfunction Unclear etiology Possibly tachycardia mediated.  Given renal failure, unexplained atrial enlargement, and infiltrative echo findings, will consider amyloid as another possible cause. Will start metoprolol and titrate for rate control Plan cardioversion once other medical issues are improved. Unable to start ACE inhibitor given renal failure. Check spep/upep Unable to obtain MRI with renal failure  3. CAD No ischemic symptoms Add metoprolol as above    4. Lactic acidosis, acute on chronic renal failure (Stage III), abdominal pain, elevated LFTS Per primary team  Very complicated  patient with multiple  acute medical issues.  He is at risk for decompensation.  A high level of decision making was required for this encounter.  At the patient's request, I did speak with his nephew (the patient handed his phone) by phone today.  He is a family Loss adjuster, chartered.  The patient asked that I update him on current status.  General cardiology to follow  Army Fossa 02/11/2018 2:56 PM

## 2018-02-11 NOTE — Progress Notes (Signed)
Introduced myself to the patient and discussed that family medicine will be taking over care when he is known in ICU.  Patient says that his chest pain is better.  Appreciate care given by pulmonology/CCM.

## 2018-02-11 NOTE — Progress Notes (Signed)
PULMONARY / CRITICAL CARE MEDICINE   Name: Paul [name] MRN: [id number] DOB: 06/13/1961    ADMISSION DATE:  02/09/2018 CONSULTATION DATE:  02/10/2018  REFERRING MD:  Dr. Erin Hearing   CHIEF COMPLAINT:  Hypotension   HISTORY OF PRESENT ILLNESS:   57 yo male smoker with dyspnea, wheezing, edema.  Found to have a fib/flutter with RVR with hypotension.  PMHx of CAD s/p DES, HTN, PAF, HLD, CKD 4, Chronic pain  SUBJECTIVE:  Abdomin pain better.  Wants something to eat.  Denies nausea, chest pain.  VITAL SIGNS: BP 112/81 (BP Location: Right Arm)   Pulse (!) 110   Temp 98.4 F (36.9 C)   Resp 16   Ht 6' (1.829 m)   Wt 195 lb 8.8 oz (88.7 kg)   SpO2 100%   BMI 26.52 kg/m   INTAKE / OUTPUT: I/O last 3 completed shifts: In: 4713.4 [P.O.:120; I.V.:2943.4; IV Piggyback:1650] Out: 1950 [Urine:5515]  PHYSICAL EXAMINATION:  General - pleasant, sitting in chair Eyes - pupils reactive ENT - no sinus tenderness, no oral exudate, no LAN Cardiac - regular, tachycardic, no murmur Chest - no wheeze, rales Abd - soft, non tender Ext - no edema Skin - no rashes Neuro - normal strength   LABS:  BMET Recent Labs  Lab 02/10/18 0623 02/10/18 1540 02/11/18 0200  NA 141 143 144  144  K 4.6 5.7* 5.6*  5.5*  CL 110 112* 108  108  CO2 16* 16* 19*  19*  BUN 33* 37* 44*  43*  CREATININE 2.61* 2.95* 3.44*  3.39*  GLUCOSE 90 99 102*  120*    Electrolytes Recent Labs  Lab 02/09/18 1351 02/10/18 0623 02/10/18 1540 02/10/18 2026 02/11/18 0200  CALCIUM 8.6* 8.0* 8.3*  --  8.3*  8.2*  MG 1.7  --   --  2.2 1.9  PHOS  --   --   --   --  6.0*  6.0*    CBC Recent Labs  Lab 02/10/18 1540 02/10/18 2350 02/11/18 0200  WBC 9.2 10.3 11.0*  HGB 12.1* 11.9* 11.6*  HCT 39.1 38.6* 36.5*  PLT 189 154 168    Coag's No results for input(s): APTT, INR in the last 168 hours.  Sepsis Markers Recent Labs  Lab 02/10/18 0623  02/10/18 2350 02/11/18 0200 02/11/18 9326   LATICACIDVEN 2.2*   < > 4.3* 2.9* 2.1*  PROCALCITON 0.17  --   --  5.61  --    < > = values in this interval not displayed.    ABG Recent Labs  Lab 02/10/18 1640 02/10/18 2359 02/11/18 0440  PHART 7.290* 7.294* 7.309*  PCO2ART 35.8 37.8 43.1  PO2ART 146* 108 90.0    Liver Enzymes Recent Labs  Lab 02/10/18 0623 02/10/18 1540 02/10/18 2350 02/11/18 0200  AST 367* 598* 1,215*  --   ALT 281* 536* 1,111*  --   ALKPHOS 71 73 79  --   BILITOT 3.3* 3.4* 3.4*  --   ALBUMIN 3.2* 3.4* 3.6 3.3*    Cardiac Enzymes Recent Labs  Lab 02/10/18 1540 02/10/18 2026 02/11/18 0200  TROPONINI 0.22* 0.19* 0.25*    Glucose Recent Labs  Lab 02/10/18 0001 02/10/18 0414  GLUCAP 115* 114*    Imaging Dg Chest Port 1 View  Result Date: 02/11/2018 CLINICAL DATA:  Respiratory failure EXAM: PORTABLE CHEST 1 VIEW COMPARISON:  02/10/2018 FINDINGS: Cardiac shadow is enlarged but stable. The lungs are well aerated bilaterally. Mild vascular congestion remains. Persistent changes in  the left base are noted. Some improved aeration in the right base is noted. No bony abnormality is seen. IMPRESSION: Stable vascular congestion. Improving aeration in the right base with stable infiltrates in the left base. Electronically Signed   By: Inez Catalina M.D.   On: 02/11/2018 07:01   Dg Chest Port 1 View  Result Date: 02/10/2018 CLINICAL DATA:  Shortness of breath. EXAM: PORTABLE CHEST 1 VIEW COMPARISON:  Radiograph of December 13, 2017. FINDINGS: Stable cardiomegaly and central pulmonary vascular congestion is noted. No pneumothorax is noted. Increased bibasilar opacities are noted concerning for edema or atelectasis with possible associated pleural effusions. Bony thorax is unremarkable. IMPRESSION: Stable cardiomegaly and central pulmonary vascular congestion. Increased bibasilar densities are noted concerning for edema or atelectasis with possible associated pleural effusions. Electronically Signed   By: Marijo Conception, M.D.   On: 02/10/2018 14:50   Dg Abd Portable 1v  Result Date: 02/10/2018 CLINICAL DATA:  Acute epigastric abdominal pain. EXAM: PORTABLE ABDOMEN - 1 VIEW COMPARISON:  Radiographs of October 03, 2012. FINDINGS: The bowel gas pattern is normal. Phleboliths are noted in the pelvis. Status post bilateral hip arthroplasties. IMPRESSION: No evidence of bowel obstruction or ileus. Electronically Signed   By: Marijo Conception, M.D.   On: 02/10/2018 14:51   US Abdomen Limited Ruq  Result Date: 02/10/2018 CLINICAL DATA:  Abdominal pain for 1 week. Elevated liver function studies and lactic acid. EXAM: ABDOMEN ULTRASOUND COMPLETE COMPARISON:  None. FINDINGS: Gallbladder: Cholelithiasis with 8 mm stone in the dependent gallbladder. Diffuse gallbladder wall thickening measuring up to 7 mm in diameter. Pericholecystic edema. Murphy's sign is negative. Common bile duct: Diameter: 4 mm, normal Liver: Small hyperechoic focal liver lesion in the right lobe measuring 1.2 cm diameter, likely cavernous hemangioma. Small amount of ascites demonstrated around the liver. Portal vein is patent on color Doppler imaging with normal direction of blood flow towards the liver. IVC: No abnormality visualized. Pancreas: Visualized portion unremarkable. Spleen: Size and appearance within normal limits. Right Kidney: Length: 11 cm. Echogenicity within normal limits. No mass or hydronephrosis visualized. Left Kidney: Length: 10.3 cm. Echogenicity within normal limits. No mass or hydronephrosis visualized. Abdominal aorta: No aneurysm visualized. Other findings: Small bilateral pleural effusions. IMPRESSION: 1. Cholelithiasis with diffuse gallbladder wall thickening and pericholecystic edema. Murphy's sign is negative. Indeterminate for cholecystitis but gallbladder wall thickening and edema could be related to liver disease and ascites. 2. Small amount of upper abdominal ascites. 3. Focal lesion in the right lobe of the liver  consistent with cavernous hemangioma. 4. Small bilateral pleural effusions. Electronically Signed   By: Lucienne Capers M.D.   On: 02/10/2018 23:50     STUDIES:  Echo 4/05 >> EF 30 to 35%, mod MR, PAS 47 mmHg, marked LVH Abd u/s 4/05 >> GB stones with diffuse GB thickening, small amount of ascites, b/l pleural effusions  CULTURES: Blood 4/05 >>   ANTIBIOTICS: Zosyn 4/05 >>   SIGNIFICANT EVENTS: 4/04 Admit, cardiology consulted 4/05 Start pressors; abdominal pain  LINES/TUBES:  ASSESSMENT / PLAN:  A fib/flutter with RVR. Acute on chronic systolic CHF. Hx of CAD s/p stent, HTN, HLD. - amiodarone, heparin gtt per cardiology  CKD 4. Hyperkalemia. - f/u BMET - if K and renal function get worse, then might need nephrology assessment  Abdominal pain with elevated LFTs. - f/u HIDA scan  DVT prophylaxis - heparin gtt SUP - Protonix Nutrition - NPO pending HIDA scan Goals of care - full code  Chesley Mires, MD [facility] Pulmonary/Critical Care 02/11/2018, 8:44 AM

## 2018-02-11 NOTE — Progress Notes (Signed)
PHARMACY - PHYSICIAN COMMUNICATION CRITICAL VALUE ALERT - BLOOD CULTURE IDENTIFICATION (BCID)  Paul [name] is an 57 y.o. male who presented to [facility] on [facility]/09/2018 with a chief complaint of SOB and edema.  Assessment:  Started on Zosyn for r/o PNA/sepsis.  MRSA PCR negative.  Received BCID call from lab with 1 of 3 bottles growing staph (methicillin resistance NOT detected).  Likely contaminant.  Name of physician (or Provider) Contacted: n/a  Current antibiotics: Zosyn 3.375gm IV q8h  Changes to prescribed antibiotics recommended:  Patient is on recommended antibiotics - No changes needed  Results for orders placed or performed during the hospital encounter of 02/09/18  Blood Culture ID Panel (Reflexed) (Collected: 02/10/2018 11:46 PM)  Result Value Ref Range   Enterococcus species NOT DETECTED NOT DETECTED   Listeria monocytogenes NOT DETECTED NOT DETECTED   Staphylococcus species DETECTED (A) NOT DETECTED   Staphylococcus aureus NOT DETECTED NOT DETECTED   Methicillin resistance NOT DETECTED NOT DETECTED   Streptococcus species NOT DETECTED NOT DETECTED   Streptococcus agalactiae NOT DETECTED NOT DETECTED   Streptococcus pneumoniae NOT DETECTED NOT DETECTED   Streptococcus pyogenes NOT DETECTED NOT DETECTED   Acinetobacter baumannii NOT DETECTED NOT DETECTED   Enterobacteriaceae species NOT DETECTED NOT DETECTED   Enterobacter cloacae complex NOT DETECTED NOT DETECTED   Escherichia coli NOT DETECTED NOT DETECTED   Klebsiella oxytoca NOT DETECTED NOT DETECTED   Klebsiella pneumoniae NOT DETECTED NOT DETECTED   Proteus species NOT DETECTED NOT DETECTED   Serratia marcescens NOT DETECTED NOT DETECTED   Carbapenem resistance NOT DETECTED NOT DETECTED   Haemophilus influenzae NOT DETECTED NOT DETECTED   Neisseria meningitidis NOT DETECTED NOT DETECTED   Pseudomonas aeruginosa NOT DETECTED NOT DETECTED   Candida albicans NOT DETECTED NOT DETECTED   Candida glabrata NOT  DETECTED NOT DETECTED   Candida krusei NOT DETECTED NOT DETECTED   Candida parapsilosis NOT DETECTED NOT DETECTED   Candida tropicalis NOT DETECTED NOT DETECTED    Shalene Gallen, Rocky Crafts 02/11/2018  9:36 PM

## 2018-02-11 NOTE — Progress Notes (Signed)
CRITICAL VALUE ALERT  Critical Value:  Lactic acid 4.3  Date & Time Notied:  02/11/2018 00:45  Provider Notified: Bethann Humble MD  Orders Received/Actions taken: None

## 2018-02-11 NOTE — Progress Notes (Signed)
Pharmacist Heart Failure Core Measure Documentation  Assessment: MUKHTAR SHAMS has an EF documented as 30-35% on 4/5 by ECHO.  Rationale: Heart failure patients with left ventricular systolic dysfunction (LVSD) and an EF < 40% should be prescribed an angiotensin converting enzyme inhibitor (ACEI) or angiotensin receptor blocker (ARB) at discharge unless a contraindication is documented in the medical record.  This patient is not currently on an ACEI or ARB for HF.  This note is being placed in the record in order to provide documentation that a contraindication to the use of these agents is present for this encounter.  ACE Inhibitor or Angiotensin Receptor Blocker is contraindicated (specify all that apply)  $Remov'[]'lyVgVx$   ACEI allergy AND ARB allergy $RemoveBef'[]'EQGILbzCRc$   Angioedema $RemoveBef'[]'zbSGfwzgAL$   Moderate or severe aortic stenosis $RemoveBeforeD'[]'evTdXmbNoVfTUg$   Hyperkalemia $RemoveBefor'[]'tvzqYRzfrKqy$   Hypotension $RemoveBefo'[]'QOVDUNLTyBB$   Renal artery stenosis $RemoveBeforeD'[x]'ltbMtHheQeMhWh$   Worsening renal function, preexisting renal disease or dysfunction   Lawson Radar 02/11/2018 3:06 PM

## 2018-02-11 NOTE — Progress Notes (Signed)
Update:   Lactate still high but trended down a little 5.1 >> 4.3; will continue to trend.  Foley catheter place with low UOP since then. Repeat ABG continues to show metabolic acidosis despite 50MEQ bicarb given earlier.  Will give additional 50MEQ Bicarb IV push then start Bicarb gtt. Will only run gtt at 75cc/hr as patient has CHF  Question if the initial "CHF exacerbation" was actually flash pulmonary edema in setting of Aflutter with RVR and that his total body intravascular volume is low. Continue to monitor UOP and respiratory status closely.  Aflutter with RVR; had been rate controlled but now is becoming more tachycardic with HR in 100's despite being asleep. Again, question if could now be dry. Will re-eval HR response to gentle IVF's.   Worsening Transaminitits (shock liver vs acute hepatitis?). Send acute viral hepatitis panel. Continue monitoring LFT's.  Hyperbilirubinemia. RUQ US showed cholelithiasis and diffuse GB wall thickening but no murphy sign; interpreted as inconclusive for cholecystitis. Have ordered HIDA scan.   Vernie Murders, MD Pulmonary & Critical Care Medicine Pager: [phone]

## 2018-02-11 NOTE — Progress Notes (Addendum)
ANTICOAGULATION CONSULT NOTE - Follow-Up  Pharmacy Consult for Heparin Indication: atrial fibrillation  Allergies  Allergen Reactions  . Ibuprofen Other (See Comments)    Kidney damage  . Nsaids     REACTION: nsaid induced nephropathy    Patient Measurements: Height: 6' (182.9 cm) Weight: 195 lb 8.8 oz (88.7 kg) IBW/kg (Calculated) : 77.6  Vital Signs: Temp: 98.2 F (36.8 C) (04/06 1000) Temp Source: Oral (04/06 0328) BP: 99/76 (04/06 1400) Pulse Rate: 112 (04/06 1400)  Labs: Recent Labs    02/10/18 0623  02/10/18 1540 02/10/18 2026 02/10/18 2128 02/10/18 2350 02/11/18 0200 02/11/18 1337  HGB 11.9*  --  12.1*  --   --  11.9* 11.6*  --   HCT 37.6*  --  39.1  --   --  38.6* 36.5*  --   PLT 170  --  189  --   --  154 168  --   HEPARINUNFRC  --    < >  --   --  >2.20*  --  0.18* 0.35  CREATININE 2.61*  --  2.95*  --   --   --  3.44*  3.39*  --   TROPONINI 0.14*   < > 0.22* 0.19*  --   --  0.25*  --    < > = values in this interval not displayed.    Estimated Creatinine Clearance: 25.4 mL/min (A) (by C-G formula based on SCr of 3.44 mg/dL (H)).   Assessment: 57yo male went to PCP for SOB and CP w/ LE edema > sent to ED for acute CHF +/- atrial flutter, to begin heparin. Noted hx PAF PTA however was not on any anticoagulation for this.  Heparin level this afternoon resulted as therapeutic after a rate incrase earlier today (HL 0.35 << 0.18, goal of 0.3-0.7). Hgb 11.6, plts wnl  Goal of Therapy:  Heparin level 0.3-0.7 units/ml Monitor platelets by anticoagulation protocol: Yes   Plan:  - Continue Heparin at 1450 units/hr (14.5 ml/hr) - Will continue to monitor for any signs/symptoms of bleeding and will follow up with heparin level in 8 hours to confirm  Thank you for allowing pharmacy to be a part of this patient's care.  Alycia Rossetti, PharmD, BCPS Clinical Pharmacist Pager: [phone] 02/11/2018 2:16 PM     ----------------------------------------------------------------------------------------  Addendum: Heparin level this evening remains therapeutic (HL 0.37 << 0.35) - will continue current rate and f/u AM level  1. Continue Heparin at 1450 units/hr (14.5 ml/hr) 2. Will continue to monitor for any signs/symptoms of bleeding and will follow up with heparin level in the a.m.   Alycia Rossetti, PharmD, BCPS Pager: [phone] 8:45 PM

## 2018-02-11 NOTE — Progress Notes (Addendum)
[facility] for Heparin Indication: atrial fibrillation  Allergies  Allergen Reactions  . Ibuprofen Other (See Comments)    Kidney damage  . Nsaids     REACTION: nsaid induced nephropathy    Patient Measurements: Height: 6' (182.9 cm) Weight: 193 lb 5.5 oz (87.7 kg) IBW/kg (Calculated) : 77.6  Vital Signs: Temp: 98.5 F (36.9 C) (04/05 1600) Temp Source: Oral (04/05 1600) BP: 113/89 (04/05 2028) Pulse Rate: 105 (04/05 2028)  Labs: Recent Labs    02/09/18 1351  02/10/18 0623 02/10/18 0843 02/10/18 1124 02/10/18 1540 02/10/18 2026 02/10/18 2128 02/10/18 2350  HGB 11.2*  --  11.9*  --   --  12.1*  --   --  11.9*  HCT 36.3*  --  37.6*  --   --  39.1  --   --  38.6*  PLT 196  --  170  --   --  189  --   --  154  HEPARINUNFRC  --   --   --  0.26* 0.39  --   --  >2.20*  --   CREATININE 1.96*  --  2.61*  --   --  2.95*  --   --   --   TROPONINI  --    < > 0.14* 0.15* 0.19* 0.22* 0.19*  --   --    < > = values in this interval not displayed.     Assessment: 57yo male went to PCP for SOB and CP w/ LE edema > sent to ED for acute CHF +/- atrial flutter, to begin heparin. Noted hx PAF PTA however was not on any anticoagulation for this.  Heparin level drawn this evening is SUPRAtherapeutic. However, the patient is a hard stick, after speaking with him and the RN, he states there were phlebotomists drawing blood on both arms. Heparin is running through a P-IV on his left arm, the level may have been drawn from this arm as well. Patient was just therapeutic at the current rate, therefore will leave it as is.  Goal of Therapy:  Heparin level 0.3-0.7 units/ml Monitor platelets by anticoagulation protocol: Yes   Plan:  - Continue heparin at 1300 units/hr - Recheck level with AM labs - Daily HL, CBC   Charmine Bockrath, Jake Church 02/11/2018 12:54 AM   Addendum -Heparin level now subtherapeutic -Will increase heparin to 1450 units/hr -Check in 8  hours  Harvel Quale 02/11/2018 3:56 AM

## 2018-02-12 DIAGNOSIS — N189 Chronic kidney disease, unspecified: Secondary | ICD-10-CM

## 2018-02-12 DIAGNOSIS — I2510 Atherosclerotic heart disease of native coronary artery without angina pectoris: Secondary | ICD-10-CM

## 2018-02-12 DIAGNOSIS — N289 Disorder of kidney and ureter, unspecified: Secondary | ICD-10-CM

## 2018-02-12 LAB — CBC
HCT: 35 % — ABNORMAL LOW (ref 39.0–52.0)
HEMOGLOBIN: 11 g/dL — AB (ref 13.0–17.0)
MCH: 24.4 pg — AB (ref 26.0–34.0)
MCHC: 31.4 g/dL (ref 30.0–36.0)
MCV: 77.8 fL — ABNORMAL LOW (ref 78.0–100.0)
PLATELETS: 153 10*3/uL (ref 150–400)
RBC: 4.5 MIL/uL (ref 4.22–5.81)
RDW: 14.2 % (ref 11.5–15.5)
WBC: 6.5 10*3/uL (ref 4.0–10.5)

## 2018-02-12 LAB — HEPARIN LEVEL (UNFRACTIONATED): HEPARIN UNFRACTIONATED: 0.42 [IU]/mL (ref 0.30–0.70)

## 2018-02-12 LAB — PROCALCITONIN: PROCALCITONIN: 6.67 ng/mL

## 2018-02-12 LAB — BETA 2 MICROGLOBULIN, SERUM: BETA 2 MICROGLOBULIN: 4.1 mg/L — AB (ref 0.6–2.4)

## 2018-02-12 MED ORDER — METOPROLOL TARTRATE 25 MG PO TABS
37.5000 mg | ORAL_TABLET | Freq: Four times a day (QID) | ORAL | Status: DC
  Administered 2018-02-12 – 2018-02-14 (×8): 37.5 mg via ORAL
  Filled 2018-02-12 (×8): qty 1

## 2018-02-12 MED ORDER — OXYCODONE HCL 5 MG PO TABS
5.0000 mg | ORAL_TABLET | Freq: Four times a day (QID) | ORAL | Status: DC | PRN
  Administered 2018-02-12 – 2018-02-13 (×5): 5 mg via ORAL
  Filled 2018-02-12 (×5): qty 1

## 2018-02-12 MED ORDER — FENTANYL CITRATE (PF) 100 MCG/2ML IJ SOLN: 50.0000 ug | INTRAMUSCULAR | Status: DC | PRN

## 2018-02-12 MED ORDER — SODIUM CHLORIDE 0.9 % IV SOLN: INTRAVENOUS | Status: DC | PRN

## 2018-02-12 MED ORDER — PANTOPRAZOLE SODIUM 40 MG PO TBEC
40.0000 mg | DELAYED_RELEASE_TABLET | Freq: Every day | ORAL | Status: DC
  Administered 2018-02-12 – 2018-02-13 (×2): 40 mg via ORAL
  Filled 2018-02-12 (×2): qty 1

## 2018-02-12 NOTE — Progress Notes (Signed)
Progress Note   Subjective   Doing well today, the patient denies CP or SOB.  No new concerns  Inpatient Medications    Scheduled Meds: . mouth rinse  15 mL Mouth Rinse BID  . metoprolol tartrate  25 mg Oral Q6H  . pantoprazole  40 mg Oral Q1200  . sucralfate  1 g Oral TID WC & HS   Continuous Infusions: . sodium chloride    . heparin 1,450 Units/hr (02/12/18 1013)  . phenylephrine (NEO-SYNEPHRINE) Adult infusion Stopped (02/11/18 0741)  . piperacillin-tazobactam (ZOSYN)  IV Stopped (02/12/18 1001)   PRN Meds: sodium chloride, alum & mag hydroxide-simeth, fentaNYL (SUBLIMAZE) injection, oxyCODONE   Vital Signs    Vitals:   02/12/18 0800 02/12/18 0900 02/12/18 1000 02/12/18 1100  BP: 105/77 108/87 101/73 106/71  Pulse: (!) 116 (!) 114 (!) 117 (!) 117  Resp:      Temp:      TempSrc:      SpO2: 97% 93% 96% 97%  Weight:      Height:        Intake/Output Summary (Last 24 hours) at 02/12/2018 1134 Last data filed at 02/12/2018 1100 Gross per 24 hour  Intake 2809.36 ml  Output 1980 ml  Net 829.36 ml   Filed Weights   02/10/18 0415 02/11/18 0500 02/12/18 0457  Weight: 193 lb 5.5 oz (87.7 kg) 195 lb 8.8 oz (88.7 kg) 205 lb 7.5 oz (93.2 kg)    Telemetry    Atrial flutter with 2:1 conduction (120 bpm) - Personally Reviewed  Physical Exam   GEN- The patient is less ill appearing, sleeping but rouses   Head- normocephalic, atraumatic Eyes-  Sclera clear, conjunctiva pink Ears- hearing intact Oropharynx- clear Neck- supple, Lungs- Clear to ausculation bilaterally, normal work of breathing Heart- irregular rate and rhythm  GI- soft, less tender Extremities- no clubbing, cyanosis, or edema  MS- no significant deformity or atrophy Skin- no rash or lesion Psych- euthymic mood, full affect Neuro- strength and sensation are intact   Labs    Chemistry Recent Labs  Lab 02/10/18 0623 02/10/18 1540 02/10/18 2350 02/11/18 0200 02/11/18 2209  NA 141 143  --   144  144 141  K 4.6 5.7*  --  5.6*  5.5* 4.9  CL 110 112*  --  108  108 106  CO2 16* 16*  --  19*  19* 22  GLUCOSE 90 99  --  102*  120* 120*  BUN 33* 37*  --  44*  43* 51*  CREATININE 2.61* 2.95*  --  3.44*  3.39* 3.63*  CALCIUM 8.0* 8.3*  --  8.3*  8.2* 8.0*  PROT 5.7* 6.1* 6.3*  --   --   ALBUMIN 3.2* 3.4* 3.6 3.3*  --   AST 367* 598* 1,215*  --   --   ALT 281* 536* 1,111*  --   --   ALKPHOS 71 73 79  --   --   BILITOT 3.3* 3.4* 3.4*  --   --   GFRNONAA 25* 22*  --  18*  18* 17*  GFRAA 29* 25*  --  21*  21* 20*  ANIONGAP 15 15  --  17*  17* 13     Hematology Recent Labs  Lab 02/10/18 2350 02/11/18 0200 02/12/18 0458  WBC 10.3 11.0* 6.5  RBC 4.82 4.61 4.50  HGB 11.9* 11.6* 11.0*  HCT 38.6* 36.5* 35.0*  MCV 80.1 79.2 77.8*  MCH 24.7* 25.2* 24.4*  MCHC 30.8 31.8 31.4  RDW 14.7 14.7 14.2  PLT 154 168 153    Cardiac Enzymes Recent Labs  Lab 02/10/18 1124 02/10/18 1540 02/10/18 2026 02/11/18 0200  TROPONINI 0.19* 0.22* 0.19* 0.25*    Recent Labs  Lab 02/09/18 1412  TROPIPOC 0.06        Assessment & Plan    1.  Shock with multiple organ failure and lactic acidosis The cause remains unclear Not likely due to his atrial arrhythmias. Appreciate PCCM assistance.  Appears to be slowly improving.  2. Typical atrial flutter Too ill for ablation currently Would plan TEE guided cardioversion later this week (maybe Tuesday if schedule allows). Increase metoprolol to 37.$RemoveBeforeDE'5mg'hhakVkThfCHhOBx$  Q6h today Continue on IV heparin If renal function is not improved by tomorrow, would start coumadin Could consider switching to NOAC once creatinine is <2  3. Acute systolic dysfunction Hopefully will improve with sinus rhythm Titrate metoprolol as above Cannot use ACEi/ARB with current renal function SPEP/UPEP ordered yesterday  4. CAD Prior cath reviewed Medical management at this time  5. Acute on chronic renal insufficiency (stage IV) No labs this am Hopefully  creatinine will improve Consider renal consultation if not improved soon  6. Elevated LFTs/ abdominal pain Likely due to shock Hopefully will improve soon  General cardiology to follow  Thompson Grayer MD, [facility] 02/12/2018 11:34 AM

## 2018-02-12 NOTE — Progress Notes (Signed)
PULMONARY / CRITICAL CARE MEDICINE   Name: Paul [name] MRN: [id number] DOB: May 02, 1961    ADMISSION DATE:  02/09/2018 CONSULTATION DATE:  02/10/2018  REFERRING MD:  Dr. Erin Hearing   CHIEF COMPLAINT:  Hypotension   HISTORY OF PRESENT ILLNESS:   57 yo male smoker with dyspnea, wheezing, edema.  Found to have a fib/flutter with RVR with hypotension.  PMHx of CAD s/p DES, HTN, PAF, HLD, CKD 4, Chronic pain  SUBJECTIVE:  Denies abdominal pain, nausea, chest pain.  Slept okay last night.  Tolerating diet.  VITAL SIGNS: BP 93/75   Pulse (!) 113   Temp 97.8 F (36.6 C) (Oral)   Resp (!) 22   Ht 6' (1.829 m)   Wt 205 lb 7.5 oz (93.2 kg)   SpO2 100%   BMI 27.87 kg/m   INTAKE / OUTPUT: I/O last 3 completed shifts: In: 3918.2 [P.O.:600; I.V.:3268.2; IV Piggyback:50] Out: 4098 [Urine:3295]  PHYSICAL EXAMINATION:  General - pleasant Eyes - pupils reactive ENT - no sinus tenderness, no oral exudate, no LAN Cardiac - regular, tachycardic, no murmur Chest - no wheeze, rales Abd - soft, non tender Ext - no edema Skin - no rashes Neuro - normal strength Psych - normal mood   LABS:  BMET Recent Labs  Lab 02/10/18 1540 02/11/18 0200 02/11/18 2209  NA 143 144  144 141  K 5.7* 5.6*  5.5* 4.9  CL 112* 108  108 106  CO2 16* 19*  19* 22  BUN 37* 44*  43* 51*  CREATININE 2.95* 3.44*  3.39* 3.63*  GLUCOSE 99 102*  120* 120*    Electrolytes Recent Labs  Lab 02/10/18 1540 02/10/18 2026 02/11/18 0200 02/11/18 2209  CALCIUM 8.3*  --  8.3*  8.2* 8.0*  MG  --  2.2 1.9 1.9  PHOS  --   --  6.0*  6.0*  --     CBC Recent Labs  Lab 02/10/18 2350 02/11/18 0200 02/12/18 0458  WBC 10.3 11.0* 6.5  HGB 11.9* 11.6* 11.0*  HCT 38.6* 36.5* 35.0*  PLT 154 168 153    Coag's No results for input(s): APTT, INR in the last 168 hours.  Sepsis Markers Recent Labs  Lab 02/10/18 0623  02/11/18 0200 02/11/18 0638 02/11/18 1026 02/11/18 2209  LATICACIDVEN 2.2*   <  > 2.9* 2.1* 2.0* 1.9  PROCALCITON 0.17  --  5.61  --   --   --    < > = values in this interval not displayed.    ABG Recent Labs  Lab 02/10/18 1640 02/10/18 2359 02/11/18 0440  PHART 7.290* 7.294* 7.309*  PCO2ART 35.8 37.8 43.1  PO2ART 146* 108 90.0    Liver Enzymes Recent Labs  Lab 02/10/18 0623 02/10/18 1540 02/10/18 2350 02/11/18 0200  AST 367* 598* 1,215*  --   ALT 281* 536* 1,111*  --   ALKPHOS 71 73 79  --   BILITOT 3.3* 3.4* 3.4*  --   ALBUMIN 3.2* 3.4* 3.6 3.3*    Cardiac Enzymes Recent Labs  Lab 02/10/18 1540 02/10/18 2026 02/11/18 0200  TROPONINI 0.22* 0.19* 0.25*    Glucose Recent Labs  Lab 02/10/18 0001 02/10/18 0414 02/11/18 0735  GLUCAP 115* 114* 100*    Imaging Nm Hepato W/eject Fract  Result Date: 02/11/2018 CLINICAL DATA:  57 year old male with right upper quadrant pain. Cholecystitis expected. EXAM: NUCLEAR MEDICINE HEPATOBILIARY IMAGING WITH GALLBLADDER EF TECHNIQUE: Sequential images of the abdomen were obtained out to 60 minutes following intravenous  administration of radiopharmaceutical. After oral ingestion of Ensure, gallbladder ejection fraction was determined. At 60 min, normal ejection fraction is greater than 33%. RADIOPHARMACEUTICALS:  7.79 mCi Tc-18m  Choletec IV COMPARISON:  None. FINDINGS: Prompt uptake and biliary excretion of activity by the liver is seen. Gallbladder activity is visualized, consistent with patency of cystic duct. Biliary activity passes into small bowel, consistent with patent common bile duct. Calculated gallbladder ejection fraction is 95%. (Normal gallbladder ejection fraction with Ensure is greater than 33%.) IMPRESSION: 1. Normal visualization of the gallbladder indicating patency of the cystic duct. 2. Normal gallbladder ejection fraction of 95% at 60 minutes. Electronically Signed   By: Jacqulynn Cadet M.D.   On: 02/11/2018 15:19     STUDIES:  Echo 4/05 >> EF 30 to 35%, mod MR, PAS 47 mmHg, marked  LVH Abd u/s 4/05 >> GB stones with diffuse GB thickening, small amount of ascites, b/l pleural effusions HIDA scan 4/06 >> normal GB  CULTURES: Blood 4/05 >> GPC >> Blood 4/06 >>   ANTIBIOTICS: Zosyn 4/05 >>   SIGNIFICANT EVENTS: 4/04 Admit, cardiology consulted 4/05 Start pressors; abdominal pain 4/06 Off pressors; amiodarone d/c'ed due to elevated LFTs  ASSESSMENT / PLAN:  A fib/flutter with RVR. Acute on chronic systolic CHF. Hx of CAD s/p stent, HTN, HLD. - lopressor - heparin gtt - cardiology following with plans for TEE cardioversion later this week  CKD 4. Hyperkalemia > resolved. Non gap acidosis > resolved. - f/u BMET - d/c HCO3 from IV fluid  Abdominal pain with elevated LFTs. - improved and HIDA scan negative - f/u LFTs  GPC in 1 blood culture. - possible contaminate - continue zosyn pending culture results  DVT prophylaxis - heparin gtt SUP - protonix Nutrition - heart health diet Goals of care - full code  Transfer to SDU >> will ask FPTS to resume primary care from 4/08.  Updated pt's family by phone.  Chesley Mires, MD [facility] Pulmonary/Critical Care 02/12/2018, 7:41 AM

## 2018-02-12 NOTE — Progress Notes (Signed)
ANTICOAGULATION CONSULT NOTE - Follow-Up  Pharmacy Consult for Heparin Indication: atrial fibrillation  Allergies  Allergen Reactions  . Ibuprofen Other (See Comments)    Kidney damage  . Nsaids     REACTION: nsaid induced nephropathy    Patient Measurements: Height: 6' (182.9 cm) Weight: 205 lb 7.5 oz (93.2 kg) IBW/kg (Calculated) : 77.6  Vital Signs: Temp: 98.2 F (36.8 C) (04/07 1135) Temp Source: Oral (04/07 1135) BP: 95/71 (04/07 1200) Pulse Rate: 118 (04/07 1200)  Labs: Recent Labs    02/10/18 1540 02/10/18 2026  02/10/18 2350 02/11/18 0200 02/11/18 1337 02/11/18 1956 02/11/18 2209 02/12/18 0458  HGB 12.1*  --   --  11.9* 11.6*  --   --   --  11.0*  HCT 39.1  --   --  38.6* 36.5*  --   --   --  35.0*  PLT 189  --   --  154 168  --   --   --  153  HEPARINUNFRC  --   --    < >  --  0.18* 0.35 0.37  --  0.42  CREATININE 2.95*  --   --   --  3.44*  3.39*  --   --  3.63*  --   TROPONINI 0.22* 0.19*  --   --  0.25*  --   --   --   --    < > = values in this interval not displayed.    Estimated Creatinine Clearance: 26 mL/min (A) (by C-G formula based on SCr of 3.63 mg/dL (H)).   Assessment: 57yo male went to PCP for SOB and CP w/ LE edema > sent to ED for acute CHF +/- atrial flutter, to begin heparin. Noted hx PAF PTA however was not on any anticoagulation for this. Cards on board and planning to start Warfarin vs DOAC depending on improvement in renal function.  Heparin level this morning remains therapeutic (HL 0.42 << 0.35, goal of 0.3-0.7). Hgb 11, plts wnl  Goal of Therapy:  Heparin level 0.3-0.7 units/ml Monitor platelets by anticoagulation protocol: Yes   Plan:  - Continue Heparin at 1450 units/hr (14.5 ml/hr) - Will continue to monitor for any signs/symptoms of bleeding and will follow up with heparin level in the a.m.   Thank you for allowing pharmacy to be a part of this patient's care.  Alycia Rossetti, PharmD, BCPS Clinical  Pharmacist Pager: [phone] Clinical phone for 02/12/2018 from 7a-3:30p: [phone] If after 3:30p, please call main pharmacy at: [phone] 02/12/2018 12:16 PM

## 2018-02-13 ENCOUNTER — Encounter (HOSPITAL_COMMUNITY): Payer: Self-pay

## 2018-02-13 ENCOUNTER — Other Ambulatory Visit: Payer: Self-pay

## 2018-02-13 ENCOUNTER — Inpatient Hospital Stay (HOSPITAL_COMMUNITY): Payer: Medicare HMO

## 2018-02-13 LAB — COMPREHENSIVE METABOLIC PANEL
ALBUMIN: 2.9 g/dL — AB (ref 3.5–5.0)
ALK PHOS: 59 U/L (ref 38–126)
ALT: 739 U/L — AB (ref 17–63)
AST: 313 U/L — AB (ref 15–41)
Anion gap: 10 (ref 5–15)
BILIRUBIN TOTAL: 3.1 mg/dL — AB (ref 0.3–1.2)
BUN: 41 mg/dL — AB (ref 6–20)
CO2: 24 mmol/L (ref 22–32)
CREATININE: 3.01 mg/dL — AB (ref 0.61–1.24)
Calcium: 7.9 mg/dL — ABNORMAL LOW (ref 8.9–10.3)
Chloride: 104 mmol/L (ref 101–111)
GFR calc Af Amer: 25 mL/min — ABNORMAL LOW (ref >60)
GFR, EST NON AFRICAN AMERICAN: 21 mL/min — AB (ref >60)
Glucose, Bld: 120 mg/dL — ABNORMAL HIGH (ref 65–99)
POTASSIUM: 4.3 mmol/L (ref 3.5–5.1)
Sodium: 138 mmol/L (ref 135–145)
TOTAL PROTEIN: 5.6 g/dL — AB (ref 6.5–8.1)

## 2018-02-13 LAB — CBC
HEMATOCRIT: 34.4 % — AB (ref 39.0–52.0)
HEMOGLOBIN: 10.7 g/dL — AB (ref 13.0–17.0)
MCH: 24.4 pg — ABNORMAL LOW (ref 26.0–34.0)
MCHC: 31.1 g/dL (ref 30.0–36.0)
MCV: 78.5 fL (ref 78.0–100.0)
Platelets: 157 10*3/uL (ref 150–400)
RBC: 4.38 MIL/uL (ref 4.22–5.81)
RDW: 14.1 % (ref 11.5–15.5)
WBC: 6.8 10*3/uL (ref 4.0–10.5)

## 2018-02-13 LAB — CULTURE, BLOOD (ROUTINE X 2): Special Requests: ADEQUATE

## 2018-02-13 LAB — HEPATITIS PANEL, ACUTE
HEP A IGM: NEGATIVE
HEP B S AG: NEGATIVE
Hep B C IgM: NEGATIVE

## 2018-02-13 LAB — PROTIME-INR
INR: 1.22
Prothrombin Time: 15.3 seconds — ABNORMAL HIGH (ref 11.4–15.2)

## 2018-02-13 LAB — HEPARIN LEVEL (UNFRACTIONATED): HEPARIN UNFRACTIONATED: 0.43 [IU]/mL (ref 0.30–0.70)

## 2018-02-13 MED ORDER — WARFARIN SODIUM 7.5 MG PO TABS
7.5000 mg | ORAL_TABLET | Freq: Once | ORAL | Status: AC
  Administered 2018-02-13: 7.5 mg via ORAL
  Filled 2018-02-13: qty 1

## 2018-02-13 MED ORDER — OXYCODONE HCL 5 MG PO TABS
5.0000 mg | ORAL_TABLET | ORAL | Status: DC | PRN
  Administered 2018-02-13 – 2018-02-16 (×9): 5 mg via ORAL
  Filled 2018-02-13 (×9): qty 1

## 2018-02-13 MED ORDER — WARFARIN - PHARMACIST DOSING INPATIENT: Freq: Every day | Status: DC

## 2018-02-13 MED ORDER — PANTOPRAZOLE SODIUM 40 MG PO TBEC
40.0000 mg | DELAYED_RELEASE_TABLET | Freq: Every day | ORAL | Status: DC
  Administered 2018-02-14 – 2018-02-19 (×6): 40 mg via ORAL
  Filled 2018-02-13 (×6): qty 1

## 2018-02-13 MED ORDER — FUROSEMIDE 10 MG/ML IJ SOLN
20.0000 mg | Freq: Once | INTRAMUSCULAR | Status: AC
Start: 2018-02-13 — End: 2018-02-13
  Administered 2018-02-13: 20 mg via INTRAVENOUS
  Filled 2018-02-13: qty 2

## 2018-02-13 NOTE — Plan of Care (Addendum)
Continuing to improve. VS stable. Ready for SDU/PCU. Ambulated one lap around the unit with this RN assisting; tolerated well.

## 2018-02-13 NOTE — Significant Event (Addendum)
Rapid Response Event Note  Overview: Respiratory Distress  Initial Focused Assessment: While on the unit, RNs asked for "second set of eyes" on patient. Patient was transferred out of the ICU around shift change tonight. Per RN, patient had increased SOB and was tachypneic, FMTS MD came and saw the patient. MD ordered CXR, Lasix $RemoveBe'20mg'MygbNXsUj$  IV, Troponin x 1, and pain medication were adjusted. Per RN, patient did have chest and back pain but it was reproducible.    Upon assessing the patient, he was resting, stated he felt better but his breathing still appeared labored to me, RR in the upper 20s-low 30s, lung sounds + crackles throughout, R worse than L. Patient voided 250cc since lasix, + 2 edema in bilateral lower extremities, HR in the 110-115 range, AFlutter, sats were 100% on 2L, SBP 100-110s, MAPs > 65, diaphoretic as well.   Interventions: -- No current RRT interventions--  Plan of Care (if not transferred): -- RN to monitor patient, if SOB or WOB does not improve in a hour, call RRT for re-evaluation -- BIPAP could be a consideration as well.  Event Summary:   at    [facility] Time 2330 End Time 2355  I called for an update at 415, per RNs, patient's symptoms improved.   Abigayle Wilinski R

## 2018-02-13 NOTE — Progress Notes (Signed)
Progress Note  Patient Name: Paul [name] Date of Encounter: 02/13/2018  Primary Cardiologist: Larae Grooms, MD   Subjective   57 year old gentleman with a history of coronary artery disease, hypertension and atrial flutter. Originally presented with respiratory distress.  He was noted to have hypertension and rapid atrial flutter at that time.  He had a profound lactic acidosis, acute on chronic renal insufficiency and cholelithiasis.  Seems to be doing a little bit better.  He was seen by Dr. Rayann Heman yesterday and was thought to be to sick for flutter ablation.  His thought was perhaps we do a cardioversion if we can get him a bit more stable.  Creatinine is 3 today.  His baseline appears to be around 2.  Inpatient Medications    Scheduled Meds: . mouth rinse  15 mL Mouth Rinse BID  . metoprolol tartrate  37.5 mg Oral Q6H  . pantoprazole  40 mg Oral Q1200  . sucralfate  1 g Oral TID WC & HS   Continuous Infusions: . sodium chloride    . heparin 1,450 Units/hr (02/13/18 0407)  . piperacillin-tazobactam (ZOSYN)  IV 3.375 g (02/13/18 0750)   PRN Meds: sodium chloride, alum & mag hydroxide-simeth, oxyCODONE   Vital Signs    Vitals:   02/13/18 0744 02/13/18 0800 02/13/18 0900 02/13/18 1000  BP:  98/85 90/64 105/74  Pulse: (!) 117 (!) 116 (!) 114 (!) 117  Resp:      Temp: 98.4 F (36.9 C) 98.6 F (37 C)    TempSrc: Oral Oral    SpO2: 93% 91% 94% 93%  Weight:      Height:        Intake/Output Summary (Last 24 hours) at 02/13/2018 1058 Last data filed at 02/13/2018 1000 Gross per 24 hour  Intake 1198 ml  Output 1500 ml  Net -302 ml   Filed Weights   02/11/18 0500 02/12/18 0457 02/13/18 0437  Weight: 195 lb 8.8 oz (88.7 kg) 205 lb 7.5 oz (93.2 kg) 204 lb 9.4 oz (92.8 kg)    Telemetry    Atrial flutter with RVR  - Personally Reviewed  ECG     Atrial flutter  - Personally Reviewed  Physical Exam   GEN: middle age male,  NAD  Neck: No JVD Cardiac:  Irreg Irrreg.  Respiratory: Clear to auscultation bilaterally. GI: Soft, nontender, non-distended  MS: No edema; No deformity. Neuro:  Nonfocal  Psych: Normal affect   Labs    Chemistry Recent Labs  Lab 02/10/18 1540 02/10/18 2350 02/11/18 0200 02/11/18 2209 02/13/18 0301  NA 143  --  144  144 141 138  K 5.7*  --  5.6*  5.5* 4.9 4.3  CL 112*  --  108  108 106 104  CO2 16*  --  19*  19* 22 24  GLUCOSE 99  --  102*  120* 120* 120*  BUN 37*  --  44*  43* 51* 41*  CREATININE 2.95*  --  3.44*  3.39* 3.63* 3.01*  CALCIUM 8.3*  --  8.3*  8.2* 8.0* 7.9*  PROT 6.1* 6.3*  --   --  5.6*  ALBUMIN 3.4* 3.6 3.3*  --  2.9*  AST 598* 1,215*  --   --  313*  ALT 536* 1,111*  --   --  739*  ALKPHOS 73 79  --   --  59  BILITOT 3.4* 3.4*  --   --  3.1*  GFRNONAA 22*  --  18*  18* 17* 21*  GFRAA 25*  --  21*  21* 20* 25*  ANIONGAP 15  --  17*  17* 13 10     Hematology Recent Labs  Lab 02/11/18 0200 02/12/18 0458 02/13/18 0301  WBC 11.0* 6.5 6.8  RBC 4.61 4.50 4.38  HGB 11.6* 11.0* 10.7*  HCT 36.5* 35.0* 34.4*  MCV 79.2 77.8* 78.5  MCH 25.2* 24.4* 24.4*  MCHC 31.8 31.4 31.1  RDW 14.7 14.2 14.1  PLT 168 153 157    Cardiac Enzymes Recent Labs  Lab 02/10/18 1124 02/10/18 1540 02/10/18 2026 02/11/18 0200  TROPONINI 0.19* 0.22* 0.19* 0.25*    Recent Labs  Lab 02/09/18 1412  TROPIPOC 0.06     BNP Recent Labs  Lab 02/09/18 1351  BNP 1,737.7*     DDimer No results for input(s): DDIMER in the last 168 hours.   Radiology    Nm Hepato W/eject Fract  Result Date: 02/11/2018 CLINICAL DATA:  57 year old male with right upper quadrant pain. Cholecystitis expected. EXAM: NUCLEAR MEDICINE HEPATOBILIARY IMAGING WITH GALLBLADDER EF TECHNIQUE: Sequential images of the abdomen were obtained out to 60 minutes following intravenous administration of radiopharmaceutical. After oral ingestion of Ensure, gallbladder ejection fraction was determined. At 60 min, normal  ejection fraction is greater than 33%. RADIOPHARMACEUTICALS:  7.79 mCi Tc-72m  Choletec IV COMPARISON:  None. FINDINGS: Prompt uptake and biliary excretion of activity by the liver is seen. Gallbladder activity is visualized, consistent with patency of cystic duct. Biliary activity passes into small bowel, consistent with patent common bile duct. Calculated gallbladder ejection fraction is 95%. (Normal gallbladder ejection fraction with Ensure is greater than 33%.) IMPRESSION: 1. Normal visualization of the gallbladder indicating patency of the cystic duct. 2. Normal gallbladder ejection fraction of 95% at 60 minutes. Electronically Signed   By: Jacqulynn Cadet M.D.   On: 02/11/2018 15:19    Cardiac Studies      Patient Profile     57 y.o. male   Assessment & Plan   Atrial flutter with rapid ventricular response.  Is currently too ill to consider ablation.  We will plan TEE / cardioversion Wednesday  Start coumadin today    Acute systolic congestive heart failure: Echocardiogram  shows an ejection fraction of 30-35%.  LV is severely dilated. Right ventricle is also dilated.  There is moderate pulmonary hypertension with an estimated PA pressure of 47.  3.   Elevated troponin :   Likely due to CHF .  No CP to suggest ACS  4.  Acute on kidney disease:     Cr is 3.01 today   For questions or updates, please contact [facility] Please consult [email or web address] for contact info under Cardiology/STEMI.      Signed, Mertie Moores, MD  02/13/2018, 10:58 AM

## 2018-02-13 NOTE — Progress Notes (Signed)
Family Medicine Teaching Service Daily Progress Note Intern Pager: [phone]  Patient name: Paul [name] Medical record number: [id number] Date of birth: [birth year] Age: 57 y.o. Gender: male  Primary Care Provider: Rogue Bussing, MD Consultants: Cardiology, CCM Code Status: Full  Pt Overview and Major Events to Date:  Paul [name] is a 57 y.o. male presenting with shortness of breath and edema. PMH is significant for the CHF, CAD s/p LAD DES in 5/ 2016, hypertension, PAF, hyperlipidemia, CKD 4, tobacco use and arthritis  Assessment and Plan:  Atrial flutter w/ RVR, improved Slightly above rate contol 117, BP low normal, on metoprolol 37.5 mg q6h, possible ablation on Tuesday -appreciate cardiology recs -cont metoprolol -heparin gtt, possibly switch to DOAc as renal fxn improves < 2.0  CHF, worse EF this admission, EF 35-40% -daily wt -strict I/Os -elevate legs, Ted hose for lower extremity edema  Shock with multiple organ failure including elevated LFTs Abdominal pain improved, LFT down trending.  -monitor CMP daily  AKI on CKD 4, improved  Cr imprving, SPEP/UPEP sent -BMP daily  CAD, stable Continue atorvastatin and metoprolol   Possitive Blood culture  GPC on BCx1, likely contaminant, repeat has no growth < 24, currently afebrile.  Pro-calcitonin elevated however at 6.67, no leukocytosis. -cont Zosyn, may d/c if culture neg @ 24 hr  FEN/GI: Heart healthy diet PPx: Heparin GTT  Disposition: Inpatient while receiving cardiac catheterization and IV antibiotics  Subjective:  Patient says that his chest pain is better.  Abdominal pain is improved as well.  Objective: Temp:  [97.2 F (36.2 C)-98.4 F (36.9 C)] 98.4 F (36.9 C) (04/08 0744) Pulse Rate:  [114-120] 117 (04/08 0744) BP: (87-116)/(55-87) 105/81 (04/08 0700) SpO2:  [79 %-100 %] 93 % (04/08 0744) Weight:  [204 lb 9.4 oz (92.8 kg)] 204 lb 9.4 oz (92.8 kg) (04/08 0437) Physical  Exam: Gen: NAD, resting comfortably CV: Tachycardic with a flutter, with no murmurs appreciated Pulm: NWOB, CTAB with no crackles, wheezes, or rhonchi GI: mild ruq tenderness Nondistended. MSK: no edema, cyanosis, or clubbing noted Skin: warm, dry Neuro: grossly normal, moves all extremities Psych: Normal affect and thought content  Laboratory: Recent Labs  Lab 02/11/18 0200 02/12/18 0458 02/13/18 0301  WBC 11.0* 6.5 6.8  HGB 11.6* 11.0* 10.7*  HCT 36.5* 35.0* 34.4*  PLT 168 153 157   Recent Labs  Lab 02/10/18 1540 02/10/18 2350 02/11/18 0200 02/11/18 2209 02/13/18 0301  NA 143  --  144  144 141 138  K 5.7*  --  5.6*  5.5* 4.9 4.3  CL 112*  --  108  108 106 104  CO2 16*  --  19*  19* 22 24  BUN 37*  --  44*  43* 51* 41*  CREATININE 2.95*  --  3.44*  3.39* 3.63* 3.01*  CALCIUM 8.3*  --  8.3*  8.2* 8.0* 7.9*  PROT 6.1* 6.3*  --   --  5.6*  BILITOT 3.4* 3.4*  --   --  3.1*  ALKPHOS 73 79  --   --  59  ALT 536* 1,111*  --   --  739*  AST 598* 1,215*  --   --  313*  GLUCOSE 99  --  102*  120* 120* 120*    Bonnita Hollow, MD 02/13/2018, 8:03 AM PGY-1, [place] Intern pager: [phone], text pages welcome

## 2018-02-13 NOTE — Progress Notes (Signed)
ANTICOAGULATION & ANTIBIOTIC CONSULT NOTE - Follow-Up  Pharmacy Consult for Heparin; Zosyn Indication: atrial fibrillation; pneumonia  Allergies  Allergen Reactions  . Ibuprofen Other (See Comments)    Kidney damage  . Nsaids     REACTION: nsaid induced nephropathy    Patient Measurements: Height: 6' (182.9 cm) Weight: 204 lb 9.4 oz (92.8 kg) IBW/kg (Calculated) : 77.6  Vital Signs: Temp: 98.4 F (36.9 C) (04/08 1200) Temp Source: Oral (04/08 1200) BP: 125/113 (04/08 1300) Pulse Rate: 116 (04/08 1300)  Labs: Recent Labs    02/10/18 1540 02/10/18 2026  02/11/18 0200  02/11/18 1956 02/11/18 2209 02/12/18 0458 02/13/18 0301  HGB 12.1*  --    < > 11.6*  --   --   --  11.0* 10.7*  HCT 39.1  --    < > 36.5*  --   --   --  35.0* 34.4*  PLT 189  --    < > 168  --   --   --  153 157  HEPARINUNFRC  --   --    < > 0.18*   < > 0.37  --  0.42 0.43  CREATININE 2.95*  --   --  3.44*  3.39*  --   --  3.63*  --  3.01*  TROPONINI 0.22* 0.19*  --  0.25*  --   --   --   --   --    < > = values in this interval not displayed.    Estimated Creatinine Clearance: 29 mL/min (A) (by C-G formula based on SCr of 3.01 mg/dL (H)).   Assessment: 57yo male went to PCP for SOB and CP w/ LE edema > sent to ED for acute CHF +/- atrial flutter, on IV heparin and Zosyn for r/o pneumonia/sepsis. Noted hx PAF PTA however was not on any anticoagulation for this. Cards on board and planning to start Warfarin vs DOAC depending on improvement in renal function.  Heparin level this morning remains therapeutic (HL 0.43, goal of 0.3-0.7). Hgb low-stable, plts wnl-stable. No overt bleeding noted.   Day # 4 of Zosyn for r/o pneumonia. Patient remains afebrile and wbc is within normal limits. SCr is improving at 3.01 with normalized CrCl ~25-30 mL/min.   Zosyn 4/5 >>  4/5 MRSA PCR >> neg 4/5 BCx >> 1/2 MSSE - possible contaminant 4/6 BCx >> ngtd  Goal of Therapy:  Heparin level 0.3-0.7  units/ml Monitor platelets by anticoagulation protocol: Yes   Plan:  - Continue Heparin at 1450 units/hr (14.5 ml/hr) - Will continue to monitor for any signs/symptoms of bleeding and will follow up with heparin level in the a.m.  - Continue Zosyn 3.375g IV every 8 hours - extended infusion over 4 hours.  - Monitor culture results, clinical status, and renal function.   Sloan Leiter, PharmD, BCPS, BCCCP Clinical Pharmacist Clinical phone 02/13/2018 until 3:30 PM - [phone] After hours, please call [phone] 02/13/2018 2:02 PM

## 2018-02-13 NOTE — Progress Notes (Signed)
ANTICOAGULATION CONSULT NOTE - Follow-Up  Pharmacy Consult for Heparin>>warfarin Indication: atrial fibrillation  Allergies  Allergen Reactions  . Ibuprofen Other (See Comments)    Kidney damage  . Nsaids     REACTION: nsaid induced nephropathy    Patient Measurements: Height: 6' (182.9 cm) Weight: 204 lb 9.4 oz (92.8 kg) IBW/kg (Calculated) : 77.6  Vital Signs: Temp: 97.6 F (36.4 C) (04/08 1536) Temp Source: Oral (04/08 1536) BP: 108/73 (04/08 1500) Pulse Rate: 117 (04/08 1500)  Labs: Recent Labs    02/10/18 2026  02/11/18 0200  02/11/18 1956 02/11/18 2209 02/12/18 0458 02/13/18 0301  HGB  --    < > 11.6*  --   --   --  11.0* 10.7*  HCT  --    < > 36.5*  --   --   --  35.0* 34.4*  PLT  --    < > 168  --   --   --  153 157  HEPARINUNFRC  --    < > 0.18*   < > 0.37  --  0.42 0.43  CREATININE  --   --  3.44*  3.39*  --   --  3.63*  --  3.01*  TROPONINI 0.19*  --  0.25*  --   --   --   --   --    < > = values in this interval not displayed.    Estimated Creatinine Clearance: 29 mL/min (A) (by C-G formula based on SCr of 3.01 mg/dL (H)).   Assessment: 57yo male went to PCP for SOB and CP w/ LE edema > sent to ED for acute CHF +/- atrial flutter, to begin heparin. Noted hx PAF PTA however was not on any anticoagulation for this.  Heparin level this morning remains therapeutic (0.42) with goal of 0.3-0.7. Hgb 10.7, plts wnl.   Goal of Therapy:  INR goal 2-3 Heparin level 0.3-0.7 units/ml Monitor platelets by anticoagulation protocol: Yes   Plan:  - Continue Heparin at 1450 units/hr (14.5 ml/hr) - Will continue to monitor for any signs/symptoms of bleeding and will follow up with heparin level in the a.m.   -Warfarin 7.$RemoveBefo'5mg'WVExDOOGVut$  tonight if INR ok -Check INR now then daily  Thank you for allowing pharmacy to be a part of this patient's care.  Erin Hearing PharmD., BCPS Clinical Pharmacist 02/13/2018 4:14 PM

## 2018-02-13 NOTE — Progress Notes (Signed)
Interim Progress Note  Saw and examined Mr. Hlavaty after being notified by his nurse that he was short of breath and tachypneic.    S: Patient says he feels short of breath and notes a small degree of chest and back pain, which he attributes to a fall earlier.  He says his shortness of breath is worse today and became worse after an IV in his arm was pulled out, causing a lot of blood loss.  His nurse also reports that he is frequently diaphoretic.  O: Patient appears short of breath and is tachynpeic, becoming breathless while talking.  On 2L O2 via [place] with saturations around 100%.  Crackles are heard in the bilateral lung bases.  Mildly tachycardic with regular rhythm.  2+ pitting edema present on his bilateral legs up to his knees.  Chest wall tender to palpation.  A: This appears consistent with fluid overload, likely due to CHF.  Unsure how long he has been overloaded since notes have said that he has not had edema and that his lungs were CTAB today.  PE less likely given current blood thinning regimen.  Anxiety could be a component but would not explain pedal edema or crackles.  Chest pain is likely musculoskeletal, but given risk factors and report of diaphoresis, an MI is on the differential.  P: Obtain CXR, give 20 mg IV Lasix, measure troponin x 1.  Will make oxycodone Q4H PRN due to patient request.  Will continue to monitor respiratory status.

## 2018-02-14 ENCOUNTER — Inpatient Hospital Stay (HOSPITAL_COMMUNITY): Payer: Medicare HMO

## 2018-02-14 ENCOUNTER — Encounter (HOSPITAL_COMMUNITY): Payer: Self-pay | Admitting: *Deleted

## 2018-02-14 LAB — MULTIPLE MYELOMA PANEL, SERUM
ALBUMIN SERPL ELPH-MCNC: 3 g/dL (ref 2.9–4.4)
Albumin/Glob SerPl: 1.6 (ref 0.7–1.7)
Alpha 1: 0.2 g/dL (ref 0.0–0.4)
Alpha2 Glob SerPl Elph-Mcnc: 0.6 g/dL (ref 0.4–1.0)
B-GLOBULIN SERPL ELPH-MCNC: 0.7 g/dL (ref 0.7–1.3)
GAMMA GLOB SERPL ELPH-MCNC: 0.5 g/dL (ref 0.4–1.8)
GLOBULIN, TOTAL: 2 g/dL — AB (ref 2.2–3.9)
IGA: 283 mg/dL (ref 90–386)
IgG (Immunoglobin G), Serum: 644 mg/dL — ABNORMAL LOW (ref 700–1600)
IgM (Immunoglobulin M), Srm: 31 mg/dL (ref 20–172)
Total Protein ELP: 5 g/dL — ABNORMAL LOW (ref 6.0–8.5)

## 2018-02-14 LAB — COMPREHENSIVE METABOLIC PANEL
ALBUMIN: 3.4 g/dL — AB (ref 3.5–5.0)
ALT: 777 U/L — ABNORMAL HIGH (ref 17–63)
ANION GAP: 10 (ref 5–15)
AST: 330 U/L — ABNORMAL HIGH (ref 15–41)
Alkaline Phosphatase: 66 U/L (ref 38–126)
BILIRUBIN TOTAL: 2.7 mg/dL — AB (ref 0.3–1.2)
BUN: 39 mg/dL — ABNORMAL HIGH (ref 6–20)
CO2: 24 mmol/L (ref 22–32)
Calcium: 8.4 mg/dL — ABNORMAL LOW (ref 8.9–10.3)
Chloride: 103 mmol/L (ref 101–111)
Creatinine, Ser: 3.05 mg/dL — ABNORMAL HIGH (ref 0.61–1.24)
GFR calc Af Amer: 24 mL/min — ABNORMAL LOW (ref >60)
GFR, EST NON AFRICAN AMERICAN: 21 mL/min — AB (ref >60)
GLUCOSE: 128 mg/dL — AB (ref 65–99)
POTASSIUM: 4.9 mmol/L (ref 3.5–5.1)
Sodium: 137 mmol/L (ref 135–145)
TOTAL PROTEIN: 6.5 g/dL (ref 6.5–8.1)

## 2018-02-14 LAB — CBC
HEMATOCRIT: 37.2 % — AB (ref 39.0–52.0)
Hemoglobin: 11.4 g/dL — ABNORMAL LOW (ref 13.0–17.0)
MCH: 24.4 pg — ABNORMAL LOW (ref 26.0–34.0)
MCHC: 30.6 g/dL (ref 30.0–36.0)
MCV: 79.5 fL (ref 78.0–100.0)
Platelets: 183 10*3/uL (ref 150–400)
RBC: 4.68 MIL/uL (ref 4.22–5.81)
RDW: 14.2 % (ref 11.5–15.5)
WBC: 8.1 10*3/uL (ref 4.0–10.5)

## 2018-02-14 LAB — TROPONIN I: TROPONIN I: 0.1 ng/mL — AB (ref <0.03)

## 2018-02-14 LAB — HEPARIN LEVEL (UNFRACTIONATED): HEPARIN UNFRACTIONATED: 0.54 [IU]/mL (ref 0.30–0.70)

## 2018-02-14 LAB — PROTIME-INR
INR: 1.21
PROTHROMBIN TIME: 15.2 s (ref 11.4–15.2)

## 2018-02-14 MED ORDER — AMOXICILLIN-POT CLAVULANATE 500-125 MG PO TABS
2.0000 | ORAL_TABLET | Freq: Two times a day (BID) | ORAL | Status: DC
  Administered 2018-02-14 – 2018-02-15 (×4): 1000 mg via ORAL
  Filled 2018-02-14 (×5): qty 2

## 2018-02-14 MED ORDER — FUROSEMIDE 10 MG/ML IJ SOLN
80.0000 mg | Freq: Two times a day (BID) | INTRAMUSCULAR | Status: DC
  Administered 2018-02-14 – 2018-02-15 (×4): 80 mg via INTRAVENOUS
  Filled 2018-02-14 (×4): qty 8

## 2018-02-14 MED ORDER — METOPROLOL TARTRATE 50 MG PO TABS
50.0000 mg | ORAL_TABLET | Freq: Four times a day (QID) | ORAL | Status: DC
  Administered 2018-02-14 – 2018-02-15 (×6): 50 mg via ORAL
  Filled 2018-02-14 (×7): qty 1

## 2018-02-14 MED ORDER — SODIUM CHLORIDE 0.9% FLUSH: 3.0000 mL | Freq: Two times a day (BID) | INTRAVENOUS | Status: DC

## 2018-02-14 MED ORDER — SODIUM CHLORIDE 0.9 % IV SOLN: 250.0000 mL | INTRAVENOUS | Status: DC

## 2018-02-14 MED ORDER — SODIUM CHLORIDE 0.9 % IV SOLN
INTRAVENOUS | Status: DC
  Administered 2018-02-14: 23:00:00 via INTRAVENOUS

## 2018-02-14 MED ORDER — SODIUM CHLORIDE 0.9% FLUSH: 3.0000 mL | INTRAVENOUS | Status: DC | PRN

## 2018-02-14 MED ORDER — WARFARIN SODIUM 7.5 MG PO TABS
7.5000 mg | ORAL_TABLET | Freq: Once | ORAL | Status: AC
  Administered 2018-02-14: 7.5 mg via ORAL
  Filled 2018-02-14: qty 1

## 2018-02-14 NOTE — Progress Notes (Addendum)
Progress Note  Patient Name: Paul [name] Date of Encounter: 02/14/2018  Primary Cardiologist: Larae Grooms, MD   Subjective   57 year old gentleman with a history of coronary artery disease, hypertension and atrial flutter. Originally presented with respiratory distress.  He was noted to have hypertension and rapid atrial flutter at that time.  He had a profound lactic acidosis, acute on chronic renal insufficiency and cholelithiasis.  04/09 Seems to be doing a little bit better but still SOB w/ conversation.    He was seen by Dr. Rayann Heman 04/07 and was thought to be too sick for flutter ablation.  His thought was perhaps we do a cardioversion if we can get him a bit more stable.  Creatinine is still over 3 today.  His baseline appears to be around 2.  Inpatient Medications    Scheduled Meds: . mouth rinse  15 mL Mouth Rinse BID  . metoprolol tartrate  37.5 mg Oral Q6H  . pantoprazole  40 mg Oral Q1200  . sucralfate  1 g Oral TID WC & HS  . warfarin  7.5 mg Oral ONCE-1800  . Warfarin - Pharmacist Dosing Inpatient   Does not apply q1800   Continuous Infusions: . sodium chloride    . heparin 1,450 Units/hr (02/13/18 2026)  . piperacillin-tazobactam (ZOSYN)  IV 3.375 g (02/14/18 0005)   PRN Meds: sodium chloride, alum & mag hydroxide-simeth, oxyCODONE   Vital Signs    Vitals:   02/13/18 1936 02/14/18 0039 02/14/18 0557 02/14/18 0722  BP: 110/84 (!) 109/97 (!) 111/93 116/84  Pulse:  (!) 117 (!) 113 (!) 111  Resp:      Temp: 98.6 F (37 C) 98.7 F (37.1 C) 97.7 F (36.5 C) (!) 97.4 F (36.3 C)  TempSrc: Oral Oral Oral Oral  SpO2: 97% 93%  100%  Weight:   209 lb 3.5 oz (94.9 kg)   Height:        Intake/Output Summary (Last 24 hours) at 02/14/2018 0749 Last data filed at 02/14/2018 0600 Gross per 24 hour  Intake 1447.5 ml  Output 1900 ml  Net -452.5 ml   Filed Weights   02/12/18 0457 02/13/18 0437 02/14/18 0557  Weight: 205 lb 7.5 oz (93.2 kg) 204 lb 9.4  oz (92.8 kg) 209 lb 3.5 oz (94.9 kg)    Telemetry    Atrial flutter with RVR, occ PVCs - Personally Reviewed  ECG     Atrial flutter  - Personally Reviewed  Physical Exam   GEN: middle age male,  NAD  Neck: JVD 10-11 cm Cardiac: Irreg Irrreg.  Respiratory: rales bases bilaterally. GI: Soft, nontender, non-distended  MS: 1-2+ LE edema; No deformity. Neuro:  Nonfocal  Psych: Normal affect   Labs    Chemistry Recent Labs  Lab 02/10/18 2350 02/11/18 0200 02/11/18 2209 02/13/18 0301 02/14/18 0006  NA  --  144  144 141 138 137  K  --  5.6*  5.5* 4.9 4.3 4.9  CL  --  108  108 106 104 103  CO2  --  19*  19* $Re'22 24 24  'TVW$ GLUCOSE  --  102*  120* 120* 120* 128*  BUN  --  44*  43* 51* 41* 39*  CREATININE  --  3.44*  3.39* 3.63* 3.01* 3.05*  CALCIUM  --  8.3*  8.2* 8.0* 7.9* 8.4*  PROT 6.3*  --   --  5.6* 6.5  ALBUMIN 3.6 3.3*  --  2.9* 3.4*  AST 1,215*  --   --  313* 330*  ALT 1,111*  --   --  739* 777*  ALKPHOS 79  --   --  59 66  BILITOT 3.4*  --   --  3.1* 2.7*  GFRNONAA  --  18*  18* 17* 21* 21*  GFRAA  --  21*  21* 20* 25* 24*  ANIONGAP  --  17*  17* $Re'13 10 10     'Bdn$ Hematology Recent Labs  Lab 02/12/18 0458 02/13/18 0301 02/14/18 0006  WBC 6.5 6.8 8.1  RBC 4.50 4.38 4.68  HGB 11.0* 10.7* 11.4*  HCT 35.0* 34.4* 37.2*  MCV 77.8* 78.5 79.5  MCH 24.4* 24.4* 24.4*  MCHC 31.4 31.1 30.6  RDW 14.2 14.1 14.2  PLT 153 157 183    Cardiac Enzymes Recent Labs  Lab 02/10/18 1540 02/10/18 2026 02/11/18 0200 02/14/18 0006  TROPONINI 0.22* 0.19* 0.25* 0.10*    Recent Labs  Lab 02/09/18 1412  TROPIPOC 0.06     BNP Recent Labs  Lab 02/09/18 1351  BNP 1,737.7*    Lab Results  Component Value Date   INR 1.21 02/14/2018   INR 1.22 02/13/2018   INR 1.20 04/08/2015     Radiology    Dg Chest Port 1 View  Result Date: 02/13/2018 CLINICAL DATA:  Dyspnea for several days EXAM: PORTABLE CHEST 1 VIEW COMPARISON:  02/11/2018 FINDINGS: Stable  cardiomegaly with central vascular congestion and small bilateral pleural effusions. No aortic aneurysm. Minimal aortic atherosclerosis. 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 IMPRESSION: Cardiomegaly with mild CHF. Small bilateral pleural effusions. No significant change. Electronically Signed   By: Ashley Royalty M.D.   On: 02/13/2018 23:00    Cardiac Studies    ECHO: 02/10/2018 - Left ventricle: There is marked hypertrophy of the   interventricular septum as well as the papillary muscles with a   speckled pattern to the myocardium suggestion possible   amyloidosis. The cavity size was severely dilated. There was   moderate concentric and severe asymmetric hypertrophy. Systolic   function was moderately to severely reduced. The estimated   ejection fraction was in the range of 30% to 35%. Moderate   diffuse hypokinesis with distinct regional wall motion   abnormalities. There is akinesis of the basal-midinferolateral   myocardium. The study is not technically sufficient to allow   evaluation of LV diastolic function. - Ventricular septum: The contour showed mild diastolic flattening   and mild systolic flattening. These changes are consistent with   RV volume and pressure overload. - Aortic valve: Trileaflet; moderately thickened, moderately   calcified leaflets. - Mitral valve: There was moderate regurgitation. - Left atrium: The atrium was massively dilated. - Right ventricle: The cavity size was severely dilated. Wall   thickness was normal. Systolic function was moderately reduced. - Right atrium: The atrium was massively dilated. - Tricuspid valve: There was moderate regurgitation. - Pulmonic valve: There was trivial regurgitation. - Pulmonary arteries: PA peak pressure: 47 mm Hg (S). - Pericardium, extracardiac: A trivial, free-flowing pericardial   effusion was identified along the right atrial free wall. The   fluid had no internal echoes. -  Impressions: 4 chamber enlargement with marked LVH and speckled   pattern of LV myocardium as well as trivial pericardial effusion   suggestive of infiltrative disease such as amyloidosis. Compared   to prior echo, LV and RV function have significantly declined.   Recommend cardiac MRI.  Impressions:  - 4 chamber enlargement with marked LVH and speckled pattern  of LV   myocardium as well as trivial pericardial effusion suggestive of   infiltrative disease such as amyloidosis. Compared to prior echo,   LV and RV function have significantly declined. Recommend cardiac   MRI. The right ventricular systolic pressure was increased   consistent with moderate pulmonary hypertension.  Patient Profile     57 y.o. male w/ hx CAD, HTN, A flutter was admitted 04/04 with SOB and edema, rapid atrial flutter  Assessment & Plan   1.Atrial flutter with rapid ventricular response.  Is currently too ill to consider ablation.  We will plan TEE / cardioversion Wednesday at 2:00 Start coumadin today, on heparin  2. Acute systolic congestive heart failure: Echocardiogram  shows an ejection fraction of 30-35%.  LV is severely dilated. Right ventricle is also dilated.  There is moderate pulmonary hypertension with an estimated PA pressure of 47. - needs diuresis - wt is up 12 lbs since admission - however, w/ elevated creatinine, will discuss w/ MD  3.   Elevated troponin :   Likely due to CHF .  No CP to suggest ACS  4.  Acute on kidney disease:     Cr is 3.05 today   For questions or updates, please contact [facility] Please consult [email or web address] for contact info under Cardiology/STEMI.      Signed, Rosaria Ferries, PA-C  02/14/2018, 7:49 AM    Attending Note:   The patient was seen and examined.  Agree with assessment and plan as noted above.  Changes made to the above note as needed.  Patient seen and independently examined with Rosaria Ferries, PA .   We discussed all aspects of the  encounter. I agree with the assessment and plan as stated above.  1.  Acute on chronic combined systolic and diastolic congestive heart failure.  He is net diuresis is -1.7 L so far.  Continue aggressive diuresis.  We will need to watch his creatinine closely but at this point he still massively volume overloaded.  Restart Lasix 80 mg IV BID and follow  Watch potassium    2.  Atrial flutter:  Rate is still not well controlled.  Plans are for TEE cardioversion tomorrow.   I have spent a total of 40 minutes with patient reviewing hospital  notes , telemetry, EKGs, labs and examining patient as well as establishing an assessment and plan that was discussed with the patient. > 50% of time was spent in direct patient care.    Thayer Headings, Brooke Bonito., MD, [facility] 02/14/2018, 9:35 AM [address],  [place] Pager [phone]

## 2018-02-14 NOTE — Progress Notes (Signed)
ANTICOAGULATION CONSULT NOTE  Pharmacy Consult:  Heparin / Coumadin Indication: atrial fibrillation  Allergies  Allergen Reactions  . Ibuprofen Other (See Comments)    Kidney damage  . Nsaids     REACTION: nsaid induced nephropathy    Patient Measurements: Height: 6' (182.9 cm) Weight: 209 lb 3.5 oz (94.9 kg) IBW/kg (Calculated) : 77.6  Vital Signs: Temp: 97.4 F (36.3 C) (04/09 0722) Temp Source: Oral (04/09 0722) BP: 116/84 (04/09 0722) Pulse Rate: 111 (04/09 0722)  Labs: Recent Labs    02/11/18 2209  02/12/18 0458 02/13/18 0301 02/13/18 1635 02/14/18 0006  HGB  --    < > 11.0* 10.7*  --  11.4*  HCT  --   --  35.0* 34.4*  --  37.2*  PLT  --   --  153 157  --  183  LABPROT  --   --   --   --  15.3* 15.2  INR  --   --   --   --  1.22 1.21  HEPARINUNFRC  --   --  0.42 0.43  --  0.54  CREATININE 3.63*  --   --  3.01*  --  3.05*  TROPONINI  --   --   --   --   --  0.10*   < > = values in this interval not displayed.    Estimated Creatinine Clearance: 31.2 mL/min (A) (by C-G formula based on SCr of 3.05 mg/dL (H)).   Assessment: 78 YOM went to PCP for SOB and CP with LE edema and was sent to the ED for acute CHF +/- atrial flutter.  Patient has a history of PAF however was not on any anticoagulation.  Now on IV heparin bridge to Coumadin.  Heparin level therapeutic and stable.  INR is sub-therapeutic as expected.  No bleeding reported.   Goal of Therapy:  INR 2-3 Heparin level 0.3-0.7 units/ml Monitor platelets by anticoagulation protocol: Yes    Plan:  Coumadin 7.$RemoveBef'5mg'cCJlbIQLSj$  PO today Continue heparin gtt at 1450 units/hr Daily heparin level, CBC, PT / INR   Candido Flott D. Mina Marble, PharmD, BCPS Pager:  [phone] 02/14/2018, 7:30 AM

## 2018-02-14 NOTE — Discharge Instructions (Addendum)
Thank you for allowing [place] to provide care for you, Paul [name].  You were admitted for a heart failure exacerbation and for atrial flutter.  There is a concern that you may have protein buildup causing stiffening of your heart, but more tests need to be done to further investigate whether you have this.  It will be very important that you follow up with Dr. Ola Spurr on 4/18 at 9:50AM.  Please bring your medications with you to this appointment.  She will likely set up a follow up with cardiology at that time.  [facility] should contact you tomorrow regarding a follow up appointment.  If you do not hear from them, Dr. Ola Spurr can help you secure an appointment.  We are starting some new medications for you.  Eliquis is a blood thinner that will prevent a blood clot from forming in your heart due to your atrial flutter.  You will take this medication twice per day.  Coreg helps control your heart rate and blood pressure, and you will also take these twice per day.  Iron supplements will help improve your anemia.  They can be constipating, so take with a stool softener.  We are also starting two medications to help reduce acid buildup and ulcers in your stomach, which are called Protonix and Sucralfate.  Take these as directed.    Heart Failure Heart failure means your heart has trouble pumping blood. This makes it hard for your body to work well. Heart failure is usually a long-term (chronic) condition. You must take good care of yourself and follow your doctor's treatment plan. Follow these instructions at home:  Take your heart medicine as told by your doctor. ? Do not stop taking medicine unless your doctor tells you to. ? Do not skip any dose of medicine. ? Refill your medicines before they run out. ? Take other medicines only as told by your doctor or pharmacist.  Stay active if told by your doctor. The elderly and people with severe heart failure should talk with a doctor  about physical activity.  Eat heart-healthy foods. Choose foods that are without trans fat and are low in saturated fat, cholesterol, and salt (sodium). This includes fresh or frozen fruits and vegetables, fish, lean meats, fat-free or low-fat dairy foods, whole grains, and high-fiber foods. Lentils and dried peas and beans (legumes) are also good choices.  Limit salt if told by your doctor.  Cook in a healthy way. Roast, grill, broil, bake, poach, steam, or stir-fry foods.  Limit fluids as told by your doctor.  Weigh yourself every morning. Do this after you pee (urinate) and before you eat breakfast. Write down your weight to give to your doctor.  Take your blood pressure and write it down if your doctor tells you to.  Ask your doctor how to check your pulse. Check your pulse as told.  Lose weight if told by your doctor.  Stop smoking or chewing tobacco. Do not use gum or patches that help you quit without your doctor's approval.  Schedule and go to doctor visits as told.  Nonpregnant women should have no more than 1 drink a day. Men should have no more than 2 drinks a day. Talk to your doctor about drinking alcohol.  Stop illegal drug use.  Stay current with shots (immunizations).  Manage your health conditions as told by your doctor.  Learn to manage your stress.  Rest when you are tired.  If it is really hot outside: ?  Avoid intense activities. ? Use air conditioning or fans, or get in a cooler place. ? Avoid caffeine and alcohol. ? Wear loose-fitting, lightweight, and light-colored clothing.  If it is really cold outside: ? Avoid intense activities. ? Layer your clothing. ? Wear mittens or gloves, a hat, and a scarf when going outside. ? Avoid alcohol.  Learn about heart failure and get support as needed.  Get help to maintain or improve your quality of life and your ability to care for yourself as needed. Contact a doctor if:  You gain weight quickly.  You  are more short of breath than usual.  You cannot do your normal activities.  You tire easily.  You cough more than normal, especially with activity.  You have any or more puffiness (swelling) in areas such as your hands, feet, ankles, or belly (abdomen).  You cannot sleep because it is hard to breathe.  You feel like your heart is beating fast (palpitations).  You get dizzy or light-headed when you stand up. Get help right away if:  You have trouble breathing.  There is a change in mental status, such as becoming less alert or not being able to focus.  You have chest pain or discomfort.  You faint. This information is not intended to replace advice given to you by your health care provider. Make sure you discuss any questions you have with your health care provider. Document Released: 08/03/2008 Document Revised: 04/01/2016 Document Reviewed: 12/11/2012 Elsevier Interactive Patient Education  2017 [employer].

## 2018-02-14 NOTE — Progress Notes (Addendum)
Late Entry: During bedside report 02/13/2018 1915, patient was on the phone talking, denied c/o, tachypnea noted and inreased work of breathing with saturations 100% on 2L per [place].  Upon initial shift assessment noted crackles in bilateral lower lobes and right middle lobe, +2 BLE pitting edema, heart rate sustaining 110s.  Pt verbalized a situation on 50M just prior to transfer where he described "falling into the metal wheelchair and IV coming out of arm during this and losing a lot of blood" the patient voiced he was just short of breath due to this experience and had chest/back pain from falling into the chair.  He stated he just wanted to rest.  MD notified of patient's respiratory symptoms with orders received.  CXR obtained, 20 mg IV lasix and oral pain medication given per orders. As of 2330, patient had voided 250 ml and stated he felt better, labored breathing still noted at rest with saturations maintaining 98-100%.  Report given to oncoming RN an RRT was asked to assess patient as well.

## 2018-02-14 NOTE — Evaluation (Signed)
Physical Therapy Evaluation Patient Details Name: Paul [name] MRN: [id number] DOB: [birth year] Today's Date: 02/14/2018   History of Present Illness  Paul [name] is a 57 y.o. male presenting with shortness of breath and edema. PMH is significant for the CHF, CAD s/p LAD DES in 5/ 2016, hypertension, PAF, hyperlipidemia, CKD 4, tobacco use and arthritis  Clinical Impression  Pt admitted with/for SOB and edema due to CHF.  Pt only needs min guard assist on the whole, but gait is still not at baseline and pt has limited assist after d/c.  Pt currently limited functionally due to the problems listed below.  (see problems list.)  Pt will benefit from PT to maximize function and safety to be able to get home safely with available assist.     Follow Up Recommendations No PT follow up;Supervision - Intermittent;Other (comment)(If pt is safe and independent)    Equipment Recommendations  None recommended by PT    Recommendations for Other Services       Precautions / Restrictions Precautions Precautions: Fall Restrictions Weight Bearing Restrictions: No      Mobility  Bed Mobility Overal bed mobility: Modified Independent                Transfers Overall transfer level: Modified independent                  Ambulation/Gait Ambulation/Gait assistance: Supervision Ambulation Distance (Feet): 200 Feet Assistive device: (pushed iv pole) Gait Pattern/deviations: Step-through pattern   Gait velocity interpretation: Below normal speed for age/gender General Gait Details: rolling, stiff-legged gait, generally steady once he warmed up, but mildly antalgic initially so pt using iv pole to steady.  Stairs            Wheelchair Mobility    Modified Rankin (Stroke Patients Only)       Balance Overall balance assessment: Needs assistance   Sitting balance-Leahy Scale: Good       Standing balance-Leahy Scale: Fair                              Pertinent Vitals/Pain Pain Assessment: Faces Faces Pain Scale: Hurts little more Pain Location: general Pain Descriptors / Indicators: Discomfort;Aching Pain Intervention(s): Patient requesting pain meds-RN notified    Home Living Family/patient expects to be discharged to:: Private residence Living Arrangements: Alone Available Help at Discharge: Family;Friend(s);Available PRN/intermittently Type of Home: House Home Access: Stairs to enter Entrance Stairs-Rails: Psychiatric nurse of Steps: several Home Layout: One level Home Equipment: Cane - single point;Crutches      Prior Function Level of Independence: Independent         Comments: states there are days that he uses his cane or crutches due to his arthritis     Hand Dominance        Extremity/Trunk Assessment   Upper Extremity Assessment Upper Extremity Assessment: Overall WFL for tasks assessed    Lower Extremity Assessment Lower Extremity Assessment: Overall WFL for tasks assessed       Communication   Communication: No difficulties  Cognition Arousal/Alertness: Awake/alert Behavior During Therapy: WFL for tasks assessed/performed Overall Cognitive Status: Within Functional Limits for tasks assessed                                        General Comments General comments (skin integrity, edema, etc.):  Sats on 2L [place] during gait at 96%, HR low 120's,  At rest HR 117 bpm    Exercises     Assessment/Plan    PT Assessment Patient needs continued PT services  PT Problem List Decreased activity tolerance;Decreased balance;Decreased mobility;Decreased knowledge of use of DME;Cardiopulmonary status limiting activity;Pain       PT Treatment Interventions DME instruction;Gait training;Functional mobility training;Therapeutic activities;Patient/family education;Stair training;Balance training    PT Goals (Current goals can be found in the Care Plan section)  Acute  Rehab PT Goals Patient Stated Goal: Home when medically ready PT Goal Formulation: With patient Time For Goal Achievement: 02/28/18 Potential to Achieve Goals: Good    Frequency Min 3X/week   Barriers to discharge Decreased caregiver support      Co-evaluation               AM-PAC PT "6 Clicks" Daily Activity  Outcome Measure Difficulty turning over in bed (including adjusting bedclothes, sheets and blankets)?: None Difficulty moving from lying on back to sitting on the side of the bed? : None Difficulty sitting down on and standing up from a chair with arms (e.g., wheelchair, bedside commode, etc,.)?: None Help needed moving to and from a bed to chair (including a wheelchair)?: None Help needed walking in hospital room?: None Help needed climbing 3-5 steps with a railing? : A Little 6 Click Score: 23    End of Session Equipment Utilized During Treatment: Oxygen Activity Tolerance: Patient tolerated treatment well Patient left: Other (comment);with call bell/phone within reach(on [facility]) Nurse Communication: Mobility status PT Visit Diagnosis: Unsteadiness on feet (R26.81);Other abnormalities of gait and mobility (R26.89)    Time: 5176-1607 PT Time Calculation (min) (ACUTE ONLY): 32 min   Charges:   PT Evaluation $PT Eval Moderate Complexity: 1 Mod PT Treatments $Gait Training: 8-22 mins   PT G Codes:        02/25/18  Donnella Sham, PT [phone]  (pager)  Tessie Fass Hendrixx Severin 02/25/18, 7:09 PM

## 2018-02-14 NOTE — Progress Notes (Signed)
Family Medicine Teaching Service Daily Progress Note Intern Pager: [phone]  Patient name: Paul [name] Medical record number: [id number] Date of birth: Jun 27, 1961 Age: 57 y.o. Gender: male  Primary Care Provider: Rogue Bussing, MD Consultants: Cardiology, CCM Code Status: Full  Pt Overview and Major Events to Date:  Paul [name] is a 57 y.o. male presenting with shortness of breath and edema. PMH is significant for the CHF, CAD s/p LAD DES in 5/ 2016, hypertension, PAF, hyperlipidemia, CKD 4, tobacco use and arthritis  Assessment and Plan:  Atrial flutter w/ RVR, improved Tachycardic in the 110s, still in aflutter on repeat EKG. BP low normal, on metoprolol 37.5 mg q6h, possible ablation on Wednesday -appreciate cardiology recs -cont metoprolol -On warfarin due to elevated creatinine, possibly switch to DOAc as renal fxn improves < 2.0  CHF, worse EF this admission, EF 35-40% -Weight up 12 pounds since admission.  Patient had shortness of breath yesterday afternoon difficulty breathing, he is satting well on 2 L around percent.  Significant lower extremity edema improved with TED hose.  Chest x-ray showed stable bilateral small pleural effusions.  Patient UOP yesterday 1.9 L.  -Appreciate cardiology recommendations -daily wt -strict I/Os -elevate legs, Ted hose for lower extremity edema -Consider IV Lasix 20 mg, monitoring creatinine  Shock with multiple organ failure including elevated LFTs Abdominal pain improved, LFT stable.  -monitor CMP daily -Avoid Tylenol in setting of elevated LFTs  AKI on CKD 2, improved  Creatinine baseline 2.0. Cr 3.0x2 day, down from 3.4.  Patient had elevated beta-2 slightly elevated microglobulin 4.2, UPEP/SPEP pending for multiple myeloma workup -SPEP/UPEP sent -BMP daily  CAD, stable Continue atorvastatin and metoprolol  Possitive Blood culture  GPC on BCx1 on 4/5, no growth on repeat blood cultures x2 for 2 days.   Patient is afebrile with no leukocytosis. -Discontinue Zosyn (4/5-4/9)  Right arm pain During transfer patient fell out of wheelchair and IV got pulled out.  Patient has ongoing pain in his right arm to this.  Chronic back pain Patient reports ongoing lower back pain.  He has had multiple hip surgeries and will use oxycodone at home for this pain.  Not being able to get around.  -oxycodone IR 5 mg q6 prn -PT/OT  FEN/GI: Heart healthy diet PPx: Heparin GTT with bridge to warfarn per pharmacy  Disposition: Inpatient while receiving cardiac catheterization and IV antibiotics  Subjective:  Patient says that his chest pain is better.  Abdominal pain is improved as well.  Objective: Temp:  [97.4 F (36.3 C)-98.7 F (37.1 C)] 97.4 F (36.3 C) (04/09 0722) Pulse Rate:  [111-118] 111 (04/09 0722) BP: (105-125)/(72-113) 116/84 (04/09 0722) SpO2:  [92 %-100 %] 100 % (04/09 0722) Weight:  [209 lb 3.5 oz (94.9 kg)] 209 lb 3.5 oz (94.9 kg) (04/09 0557) Physical Exam: Gen: NAD, resting comfortably CV: Tachycardic with a flutter, with no murmurs appreciated, JVD to skull Pulm: NWOB, mild crackles in lower bases GI: soft, nontender, Nondistended. MSK: Periferal IVs in both arms, 2+ lower extremity edema with TED hose Neuro: grossly normal, moves all extremities Psych: Normal affect and thought content  Laboratory: Recent Labs  Lab 02/12/18 0458 02/13/18 0301 02/14/18 0006  WBC 6.5 6.8 8.1  HGB 11.0* 10.7* 11.4*  HCT 35.0* 34.4* 37.2*  PLT 153 157 183   Recent Labs  Lab 02/10/18 2350  02/11/18 2209 02/13/18 0301 02/14/18 0006  NA  --    < > 141 138 137  K  --    < >  4.9 4.3 4.9  CL  --    < > 106 104 103  CO2  --    < > 22 24 24   BUN  --    < > 51* 41* 39*  CREATININE  --    < > 3.63* 3.01* 3.05*  CALCIUM  --    < > 8.0* 7.9* 8.4*  PROT 6.3*  --   --  5.6* 6.5  BILITOT 3.4*  --   --  3.1* 2.7*  ALKPHOS 79  --   --  59 66  ALT 1,111*  --   --  739* 777*  AST 1,215*  --    --  313* 330*  GLUCOSE  --    < > 120* 120* 128*   < > = values in this interval not displayed.    Bonnita Hollow, MD 02/14/2018, 9:19 AM PGY-1, [place] Intern pager: [phone], text pages welcome

## 2018-02-15 ENCOUNTER — Encounter (HOSPITAL_COMMUNITY): Payer: Self-pay | Admitting: Cardiovascular Disease

## 2018-02-15 ENCOUNTER — Inpatient Hospital Stay (HOSPITAL_COMMUNITY): Payer: Medicare HMO

## 2018-02-15 ENCOUNTER — Inpatient Hospital Stay (HOSPITAL_COMMUNITY): Payer: Medicare HMO | Admitting: Anesthesiology

## 2018-02-15 ENCOUNTER — Encounter (HOSPITAL_COMMUNITY)
Admission: EM | Disposition: A | Discharge status: Home / Self Care | Payer: Self-pay | Source: Home / Self Care | Attending: Family Medicine

## 2018-02-15 DIAGNOSIS — I4892 Unspecified atrial flutter: Secondary | ICD-10-CM

## 2018-02-15 DIAGNOSIS — I340 Nonrheumatic mitral (valve) insufficiency: Secondary | ICD-10-CM

## 2018-02-15 HISTORY — PX: TEE WITHOUT CARDIOVERSION: SHX5443

## 2018-02-15 HISTORY — PX: CARDIOVERSION: SHX1299

## 2018-02-15 LAB — CBC
HEMATOCRIT: 35.2 % — AB (ref 39.0–52.0)
Hemoglobin: 11.1 g/dL — ABNORMAL LOW (ref 13.0–17.0)
MCH: 24.9 pg — AB (ref 26.0–34.0)
MCHC: 31.5 g/dL (ref 30.0–36.0)
MCV: 79.1 fL (ref 78.0–100.0)
PLATELETS: 169 10*3/uL (ref 150–400)
RBC: 4.45 MIL/uL (ref 4.22–5.81)
RDW: 14.7 % (ref 11.5–15.5)
WBC: 6.5 10*3/uL (ref 4.0–10.5)

## 2018-02-15 LAB — COMPREHENSIVE METABOLIC PANEL
ALT: 790 U/L — ABNORMAL HIGH (ref 17–63)
ANION GAP: 13 (ref 5–15)
AST: 327 U/L — ABNORMAL HIGH (ref 15–41)
Albumin: 3.1 g/dL — ABNORMAL LOW (ref 3.5–5.0)
Alkaline Phosphatase: 65 U/L (ref 38–126)
BILIRUBIN TOTAL: 2.3 mg/dL — AB (ref 0.3–1.2)
BUN: 40 mg/dL — ABNORMAL HIGH (ref 6–20)
CHLORIDE: 98 mmol/L — AB (ref 101–111)
CO2: 29 mmol/L (ref 22–32)
Calcium: 8.8 mg/dL — ABNORMAL LOW (ref 8.9–10.3)
Creatinine, Ser: 3.12 mg/dL — ABNORMAL HIGH (ref 0.61–1.24)
GFR, EST AFRICAN AMERICAN: 24 mL/min — AB (ref >60)
GFR, EST NON AFRICAN AMERICAN: 20 mL/min — AB (ref >60)
Glucose, Bld: 109 mg/dL — ABNORMAL HIGH (ref 65–99)
POTASSIUM: 4.4 mmol/L (ref 3.5–5.1)
Sodium: 140 mmol/L (ref 135–145)
TOTAL PROTEIN: 6.1 g/dL — AB (ref 6.5–8.1)

## 2018-02-15 LAB — PROTIME-INR
INR: 1.3
PROTHROMBIN TIME: 16.1 s — AB (ref 11.4–15.2)

## 2018-02-15 LAB — HEPARIN LEVEL (UNFRACTIONATED): Heparin Unfractionated: 0.46 IU/mL (ref 0.30–0.70)

## 2018-02-15 SURGERY — ECHOCARDIOGRAM, TRANSESOPHAGEAL
Anesthesia: Monitor Anesthesia Care

## 2018-02-15 MED ORDER — EPHEDRINE SULFATE 50 MG/ML IJ SOLN
INTRAMUSCULAR | Status: DC | PRN
  Administered 2018-02-15 (×2): 10 mg via INTRAVENOUS

## 2018-02-15 MED ORDER — WARFARIN SODIUM 7.5 MG PO TABS
7.5000 mg | ORAL_TABLET | Freq: Once | ORAL | Status: AC
  Administered 2018-02-15: 7.5 mg via ORAL
  Filled 2018-02-15: qty 1

## 2018-02-15 MED ORDER — PROPOFOL 10 MG/ML IV BOLUS
INTRAVENOUS | Status: DC | PRN
  Administered 2018-02-15: 40 mg via INTRAVENOUS
  Administered 2018-02-15: 60 mg via INTRAVENOUS
  Administered 2018-02-15: 100 mg via INTRAVENOUS

## 2018-02-15 MED ORDER — LACTATED RINGERS IV SOLN
INTRAVENOUS | Status: DC | PRN
  Administered 2018-02-15: 13:00:00 via INTRAVENOUS

## 2018-02-15 MED ORDER — PNEUMOCOCCAL VAC POLYVALENT 25 MCG/0.5ML IJ INJ
0.5000 mL | INJECTION | INTRAMUSCULAR | Status: AC
  Administered 2018-02-17: 0.5 mL via INTRAMUSCULAR
  Filled 2018-02-15: qty 0.5

## 2018-02-15 MED ORDER — PHENYLEPHRINE HCL 10 MG/ML IJ SOLN
INTRAMUSCULAR | Status: DC | PRN
  Administered 2018-02-15: 40 ug via INTRAVENOUS
  Administered 2018-02-15: 80 ug via INTRAVENOUS

## 2018-02-15 MED ORDER — PROPOFOL 500 MG/50ML IV EMUL
INTRAVENOUS | Status: DC | PRN
  Administered 2018-02-15: 100 ug/kg/min via INTRAVENOUS

## 2018-02-15 NOTE — Progress Notes (Signed)
OT Cancellation Note  Patient Details Name: Paul [name] MRN: [id number] DOB: 1960-12-02   Cancelled Treatment:    Reason Eval/Treat Not Completed: Patient at procedure or test/ unavailable. Pt off unit for procedure at this time. OT will check back as able.   Norman Herrlich, MS OTR/L  Pager: [phone]   Norman Herrlich 02/15/2018, 12:50 PM

## 2018-02-15 NOTE — Progress Notes (Signed)
RN took consent to patient for signature but patient insisted that he wanted his son's presence in the morning before signing anything. Consent is prepared in waiting in the chart.

## 2018-02-15 NOTE — Progress Notes (Signed)
Family Medicine Teaching Service Daily Progress Note Intern Pager: [phone]  Patient name: Paul [name] Medical record number: [id number] Date of birth: Apr 22, 1961 Age: 57 y.o. Gender: male  Primary Care Provider: Rogue Bussing, MD Consultants: Cardiology, CCM Code Status: Full  Pt Overview and Major Events to Date:  Paul [name] is a 57 y.o. male presenting with shortness of breath and edema. PMH is significant for the CHF, CAD s/p LAD DES in 5/ 2016, hypertension, PAF, hyperlipidemia, CKD 4, tobacco use and arthritis  Assessment and Plan:  Atrial flutter w/ RVR, improved Tachycardic in the 110s, Planned TEE and cardioversion today. BP low normal, on metoprolol 37.5 mg q6h, possible ablation on Wednesday -appreciate cardiology recs -cont metoprolol -On warfarin due to elevated creatinine, possibly switch to DOAc as renal fxn improves < 2.0 -NPO  CHF, worse EF this admission, EF 35-40% -Weight up 12 pounds since admission.  Patient had shortness of breath yesterday afternoon difficulty breathing, he is satting well on 2 L around percent.  Significant lower extremity edema improved with TED hose.  Chest x-ray showed stable bilateral small pleural effusions.  Patient UOP yesterday 6 L.  -Cardiology recommendations, IV lasix 80 mg BID -daily wt -strict I/Os -elevate legs, Ted hose for lower extremity edema, elevate scrotom  Shock with multiple organ failure including elevated LFTs Abdominal pain improved, LFT stable.  -monitor CMP daily -Avoid Tylenol in setting of elevated LFTs  AKI on CKD 2, stable Creatinine baseline 2.0. Cr 3.05>3.12, down from 3.4.  Patient had elevated beta-2 slightly elevated microglobulin 4.2, UPEP/SPEP pending for multiple myeloma workup. Monitor in the setting diuresis. MM Panel has low protein, indicative of not being MM. UPEP pending -UPEP sent -BMP daily  CAD, stable Continue atorvastatin and metoprolol  Basilar  Infiltrates Afebrile. No WBC. CXR on admission showed diffuse basilar infiltrates consistatnt with edema/CHF. Repeat CXR showed improvement and resolution of infiltrates. Procalcitonin elevated 6. Pt startecd on Zosyn. GPC on BCx1 on 4/5, no growth on repeat blood cultures x2 for to date days.  Patient is afebrile with no leukocytosis. -Zosyn (4/5-4/9), cont augmentin for 7 days (4/9-4/11)  Right arm pain During transfer patient fell out of wheelchair and IV got pulled out.  Patient has ongoing pain in his right arm to this. -pain well controlled  Chronic back pain, stable Patient reports ongoing lower back pain.  He has had multiple hip surgeries and will use oxycodone at home for this pain.  Not being able to get around.  -oxycodone IR 5 mg q6 prn -PT/OT - No outpatient PT  FEN/GI: NPO for TEE/Cardioversion PPx: Heparin GTT with bridge to warfarn per pharmacy  Disposition: Inpatient while receiving cardiac catheterization and IV antibiotics  Subjective:  Pt endorses mild CP, no SOB. Pain primarily in hips/back.   Objective: Temp:  [97.6 F (36.4 C)-97.8 F (36.6 C)] 97.8 F (36.6 C) (04/10 0654) Pulse Rate:  [113-117] 114 (04/10 0654) BP: (103-122)/(71-95) 110/84 (04/10 0654) SpO2:  [88 %-100 %] 97 % (04/10 0654) FiO2 (%):  [0 %] 0 % (04/09 1925) Weight:  [209 lb 10.5 oz (95.1 kg)] 209 lb 10.5 oz (95.1 kg) (04/10 0654) Physical Exam: Gen: NAD, resting comfortably CV: Tachycardic with a flutter, with no murmurs appreciated, JVD to skull Pulm: NWOB, mild crackles in lower bases GI: soft, nontender, Nondistended. MSK: Periferal IVs in both arms, 2+ lower extremity edema with TED hose Neuro: grossly normal, moves all extremities Psych: Normal affect and thought content  Laboratory: Recent  Labs  Lab 02/13/18 0301 02/14/18 0006 02/15/18 0327  WBC 6.8 8.1 6.5  HGB 10.7* 11.4* 11.1*  HCT 34.4* 37.2* 35.2*  PLT 157 183 169   Recent Labs  Lab 02/13/18 0301 02/14/18 0006  02/15/18 0327  NA 138 137 140  K 4.3 4.9 4.4  CL 104 103 98*  CO2 24 24 29   BUN 41* 39* 40*  CREATININE 3.01* 3.05* 3.12*  CALCIUM 7.9* 8.4* 8.8*  PROT 5.6* 6.5 6.1*  BILITOT 3.1* 2.7* 2.3*  ALKPHOS 59 66 65  ALT 739* 777* 790*  AST 313* 330* 327*  GLUCOSE 120* 128* 109*    Bonnita Hollow, MD 02/15/2018, 9:28 AM PGY-1, [place] Intern pager: [phone], text pages welcome

## 2018-02-15 NOTE — Progress Notes (Signed)
ANTICOAGULATION CONSULT NOTE  Pharmacy Consult:  Heparin / Coumadin Indication: atrial fibrillation  Allergies  Allergen Reactions  . Ibuprofen Other (See Comments)    Kidney damage  . Nsaids     REACTION: nsaid induced nephropathy    Patient Measurements: Height: 6' (182.9 cm) Weight: 209 lb 10.5 oz (95.1 kg) IBW/kg (Calculated) : 77.6  Vital Signs: Temp: 97.8 F (36.6 C) (04/10 0654) Temp Source: Oral (04/10 0654) BP: 110/84 (04/10 0654) Pulse Rate: 114 (04/10 0654)  Labs: Recent Labs    02/13/18 0301 02/13/18 1635 02/14/18 0006 02/15/18 0327  HGB 10.7*  --  11.4* 11.1*  HCT 34.4*  --  37.2* 35.2*  PLT 157  --  183 169  LABPROT  --  15.3* 15.2 16.1*  INR  --  1.22 1.21 1.30  HEPARINUNFRC 0.43  --  0.54 0.46  CREATININE 3.01*  --  3.05* 3.12*  TROPONINI  --   --  0.10*  --     Estimated Creatinine Clearance: 30.5 mL/min (A) (by C-G formula based on SCr of 3.12 mg/dL (H)).   Assessment: [name] went to PCP for SOB and CP with LE edema and was sent to the ED for acute CHF +/- atrial flutter.  Patient has a history of PAF however was not on any anticoagulation.  Now on IV heparin bridge to Coumadin.  Heparin level therapeutic and stable.  INR is sub-therapeutic as expected.  No bleeding reported.   Goal of Therapy:  INR 2-3 Heparin level 0.3-0.7 units/ml Monitor platelets by anticoagulation protocol: Yes    Plan:  Repeat Coumadin 7.$RemoveBeforeDEI'5mg'dwaVZvrbFbxMtDLJ$  PO today.  Increase dose in AM if INR doesn't increase meaningfully. Continue heparin gtt at 1450 units/hr Daily heparin level, CBC, PT / INR   Paul [name], PharmD, BCPS Pager:  [phone] 02/15/2018, 7:31 AM

## 2018-02-15 NOTE — Anesthesia Postprocedure Evaluation (Signed)
Anesthesia Post Note  Patient: Paul [name]  Procedure(s) Performed: TRANSESOPHAGEAL ECHOCARDIOGRAM (TEE) (N/A ) CARDIOVERSION (N/A )     Patient location during evaluation: Endoscopy Anesthesia Type: MAC Level of consciousness: awake and sedated Pain management: pain level controlled Vital Signs Assessment: post-procedure vital signs reviewed and stable Respiratory status: spontaneous breathing, nonlabored ventilation, respiratory function stable and patient connected to nasal cannula oxygen Cardiovascular status: stable and blood pressure returned to baseline Postop Assessment: no apparent nausea or vomiting Anesthetic complications: no    Last Vitals:  Vitals:   02/15/18 1515 02/15/18 1525  BP: 95/74 93/70  Pulse: 63 (!) 59  Resp: (!) 26 20  Temp:    SpO2: 100% 100%    Last Pain:  Vitals:   02/15/18 1525  TempSrc:   PainSc: 0-No pain                 Shaquia Berkley,JAMES TERRILL

## 2018-02-15 NOTE — Anesthesia Preprocedure Evaluation (Addendum)
Anesthesia Evaluation  Patient identified by MRN, date of birth, ID band Patient awake    Reviewed: Allergy & Precautions, H&P , NPO status , Patient's Chart, lab work & pertinent test results  History of Anesthesia Complications Negative for: history of anesthetic complications  Airway Mallampati: I  TM Distance: >3 FB Neck ROM: Full  Mouth opening: Limited Mouth Opening  Dental  (+) Teeth Intact, Dental Advisory Given, Caps, Poor Dentition   Pulmonary shortness of breath, Current Smoker,    breath sounds clear to auscultation       Cardiovascular hypertension, + CAD, + Past MI, + Peripheral Vascular Disease and +CHF  + dysrhythmias  Rhythm:Regular Rate:Normal     Neuro/Psych negative neurological ROS     GI/Hepatic negative GI ROS, Neg liver ROS, GERD  ,  Endo/Other  negative endocrine ROS  Renal/GU Renal Insufficiency and Renal hypertensionRenal disease     Musculoskeletal  (+) Arthritis ,   Abdominal   Peds  Hematology   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  Reproductive/Obstetrics negative OB ROS                            Anesthesia Physical  Anesthesia Plan  ASA: IV  Anesthesia Plan: MAC   Post-op Pain Management:    Induction: Intravenous  PONV Risk Score and Plan: 1 and Treatment may vary due to age or medical condition  Airway Management Planned: Mask, Natural Airway and Nasal Cannula  Additional Equipment:   Intra-op Plan:   Post-operative Plan:   Informed Consent: I have reviewed the patients History and Physical, chart, labs and discussed the procedure including the risks, benefits and alternatives for the proposed anesthesia with the patient or authorized representative who has indicated his/her understanding and acceptance.   Dental advisory given  Plan Discussed with:   Anesthesia Plan Comments: (Chart review: EZ mask airway; used  glidescope due to TMJ  Echo 02-10-18: EF 35% 4 chamber enlargement with marked LVH and speckled   pattern of LV myocardium as well as trivial pericardial effusion   suggestive of infiltrative disease such as amyloidosis.  Compared   to prior echo, LV and RV function have significantly declined.)      Anesthesia Quick Evaluation

## 2018-02-15 NOTE — Transfer of Care (Signed)
Immediate Anesthesia Transfer of Care Note  Patient: Paul [name]  Procedure(s) Performed: TRANSESOPHAGEAL ECHOCARDIOGRAM (TEE) (N/A ) CARDIOVERSION (N/A )  Patient Location: Endoscopy Unit  Anesthesia Type:MAC  Level of Consciousness: awake, alert  and oriented  Airway & Oxygen Therapy: Patient Spontanous Breathing and Patient connected to nasal cannula oxygen  Post-op Assessment: Report given to RN, Post -op Vital signs reviewed and stable and Patient moving all extremities X 4  Post vital signs: Reviewed and stable  Last Vitals:  Vitals Value Taken Time  BP 87/65 02/15/2018  2:44 PM  Temp    Pulse 38 02/15/2018  2:44 PM  Resp 20 02/15/2018  2:44 PM  SpO2 69 % 02/15/2018  2:44 PM  Vitals shown include unvalidated device data.  Last Pain:  Vitals:   02/15/18 1440  TempSrc:   PainSc: 0-No pain      Patients Stated Pain Goal: 2 (94/49/67 [id number])  Complications: No apparent anesthesia complications

## 2018-02-15 NOTE — CV Procedure (Signed)
Brief TEE Note  LVEF 35-40% LVH Mild MR Mild tr No LA/LAA thrombus or masses Eustacian valve in the RA  For additional details see report.  Electrical Cardioversion Procedure Note Paul [name] [id number] 05-08-1961  Procedure: Electrical Cardioversion Indications:  Atrial Fibrillation  Procedure Details Consent: Risks of procedure as well as the alternatives and risks of each were explained to the (patient/caregiver).  Consent for procedure obtained. Time Out: Verified patient identification, verified procedure, site/side was marked, verified correct patient position, special equipment/implants available, medications/allergies/relevent history reviewed, required imaging and test results available.  Performed  Patient placed on cardiac monitor, pulse oximetry, supplemental oxygen as necessary.  Sedation given: propofol Pacer pads placed anterior and posterior chest.  Cardioverted 1 time(s).  Cardioverted at 150J.  Evaluation Findings: Post procedure EKG shows: junctional bradycardia followed by sinus bradycardia in the 40s. Complications: None Patient did tolerate procedure well.   Paul Latch, MD 02/15/2018, 2:19 PM

## 2018-02-15 NOTE — Progress Notes (Signed)
To Endoscopy via w/c for TEE/DCCV.  Heparin infusing.

## 2018-02-15 NOTE — Progress Notes (Signed)
Physical Therapy Treatment Patient Details Name: Paul [name] MRN: [id number] DOB: [birth year] Today's Date: 02/15/2018    History of Present Illness Paul [name] is a 56 y.o. male presenting with shortness of breath and edema. PMH is significant for the CHF, CAD s/p LAD DES in 5/ 2016, hypertension, PAF, hyperlipidemia, CKD 4, tobacco use and arthritis    PT Comments    Pt likely closing in on his baseline functioning.  Pt's limiting factor to smooth function are both hips.  Pt is generally weak in lower core and motions around the hip, but he talks like he not interested in pushing through the pain for some improvement.    Follow Up Recommendations  No PT follow up;Supervision - Intermittent;Other (comment)     Equipment Recommendations  None recommended by PT    Recommendations for Other Services       Precautions / Restrictions Precautions Precautions: Fall    Mobility  Bed Mobility Overal bed mobility: Modified Independent                Transfers Overall transfer level: Modified independent                  Ambulation/Gait Ambulation/Gait assistance: Supervision;Modified independent (Device/Increase time) Ambulation Distance (Feet): 380 Feet Assistive device: None Gait Pattern/deviations: Step-through pattern   Gait velocity interpretation: at or above normal speed for age/gender General Gait Details: Stiff gait with minimal initiation of hip flexors.  Generally steady   Stairs            Wheelchair Mobility    Modified Rankin (Stroke Patients Only)       Balance Overall balance assessment: Needs assistance   Sitting balance-Leahy Scale: Good       Standing balance-Leahy Scale: Fair                              Cognition Arousal/Alertness: Awake/alert Behavior During Therapy: WFL for tasks assessed/performed Overall Cognitive Status: Within Functional Limits for tasks assessed                                        Exercises General Exercises - Lower Extremity Hip ABduction/ADduction: AROM;Both;10 reps;Standing Hip Flexion/Marching: AROM;Both;10 reps;Standing;Other (comment)(minimal hip flexion ) Heel Raises: AROM;10 reps;Standing Mini-Sqauts: AROM;10 reps;Standing    General Comments General comments (skin integrity, edema, etc.): SpO2 at 100% on 1-2 L [place],   EHR 110's      Pertinent Vitals/Pain Pain Assessment: Faces Faces Pain Scale: Hurts even more Pain Location: general Pain Descriptors / Indicators: Discomfort;Aching    Home Living                      Prior Function            PT Goals (current goals can now be found in the care plan section) Acute Rehab PT Goals Patient Stated Goal: Home when medically ready PT Goal Formulation: With patient Time For Goal Achievement: 02/28/18 Potential to Achieve Goals: Good Progress towards PT goals: Progressing toward goals    Frequency    Min 3X/week      PT Plan Current plan remains appropriate    Co-evaluation              AM-PAC PT "6 Clicks" Daily Activity  Outcome Measure  Difficulty turning over in bed (  including adjusting bedclothes, sheets and blankets)?: None Difficulty moving from lying on back to sitting on the side of the bed? : None Difficulty sitting down on and standing up from a chair with arms (e.g., wheelchair, bedside commode, etc,.)?: None Help needed moving to and from a bed to chair (including a wheelchair)?: None Help needed walking in hospital room?: None Help needed climbing 3-5 steps with a railing? : A Little 6 Click Score: 23    End of Session Equipment Utilized During Treatment: Oxygen Activity Tolerance: Patient tolerated treatment well Patient left: Other (comment)(on the toilet) Nurse Communication: Mobility status PT Visit Diagnosis: Unsteadiness on feet (R26.81);Other abnormalities of gait and mobility (R26.89)     Time: 1027-1050 PT Time  Calculation (min) (ACUTE ONLY): 23 min  Charges:  $Gait Training: 8-22 mins $Therapeutic Exercise: 8-22 mins                    G Codes:       2018/02/23  Paul [name], PT [phone]  (pager)   Paul [name] Paul [name] 2018-02-23, 11:01 AM

## 2018-02-15 NOTE — Progress Notes (Addendum)
Progress Note  Patient Name: Paul [name] Date of Encounter: 02/15/2018  Primary Cardiologist: Larae Grooms, MD   Subjective   57 year old gentleman with a history of CAD, s/p MI- PCI in 2016. He stopped his medications after one month. Other medical problems include, HTN, CRI-3   Sitting up in bed- he does not appear to SOB at rest I have reviewed the echocardiogram from earlier this week.  He has a bright speckled pattern-consistent with with amyloidosis.  For cardioversion.  Inpatient Medications    Scheduled Meds: . amoxicillin-clavulanate  2 tablet Oral BID  . furosemide  80 mg Intravenous BID  . mouth rinse  15 mL Mouth Rinse BID  . metoprolol tartrate  50 mg Oral Q6H  . pantoprazole  40 mg Oral Q1200  . sodium chloride flush  3 mL Intravenous Q12H  . sucralfate  1 g Oral TID WC & HS  . warfarin  7.5 mg Oral ONCE-1800  . Warfarin - Pharmacist Dosing Inpatient   Does not apply q1800   Continuous Infusions: . sodium chloride    . sodium chloride 20 mL/hr at 02/14/18 2300  . sodium chloride    . heparin 1,450 Units/hr (02/14/18 1403)   PRN Meds: sodium chloride, alum & mag hydroxide-simeth, oxyCODONE, sodium chloride flush   Vital Signs    Vitals:   02/14/18 2256 02/15/18 0016 02/15/18 0429 02/15/18 0654  BP: 112/71 103/75 110/84 110/84  Pulse: (!) 117 (!) 117 (!) 116 (!) 114  Resp:      Temp:  97.6 F (36.4 C)  97.8 F (36.6 C)  TempSrc:  Oral  Oral  SpO2: (!) 88% 91% 94% 97%  Weight:    209 lb 10.5 oz (95.1 kg)  Height:        Intake/Output Summary (Last 24 hours) at 02/15/2018 0810 Last data filed at 02/15/2018 7262 Gross per 24 hour  Intake 930 ml  Output 5950 ml  Net -5020 ml   Filed Weights   02/13/18 0437 02/14/18 0557 02/15/18 0654  Weight: 204 lb 9.4 oz (92.8 kg) 209 lb 3.5 oz (94.9 kg) 209 lb 10.5 oz (95.1 kg)    Telemetry    NSR occ PVCs - Personally Reviewed  ECG    NSR - Personally Reviewed  Physical Exam   GEN:  middle age male,  NAD  Neck:  mild JVD Cardiac: Irreg. Irreg.  Respiratory: mostly clear   GI: Soft, nontender, non-distended  MS:  trace edema  Neuro:  Nonfocal  Psych: Normal affect   Labs    Chemistry Recent Labs  Lab 02/13/18 0301 02/14/18 0006 02/15/18 0327  NA 138 137 140  K 4.3 4.9 4.4  CL 104 103 98*  CO2 $Re'24 24 29  'Lyx$ GLUCOSE 120* 128* 109*  BUN 41* 39* 40*  CREATININE 3.01* 3.05* 3.12*  CALCIUM 7.9* 8.4* 8.8*  PROT 5.6* 6.5 6.1*  ALBUMIN 2.9* 3.4* 3.1*  AST 313* 330* 327*  ALT 739* 777* 790*  ALKPHOS 59 66 65  BILITOT 3.1* 2.7* 2.3*  GFRNONAA 21* 21* 20*  GFRAA 25* 24* 24*  ANIONGAP $RemoveB'10 10 13     'hIVLfvXZ$ Hematology Recent Labs  Lab 02/13/18 0301 02/14/18 0006 02/15/18 0327  WBC 6.8 8.1 6.5  RBC 4.38 4.68 4.45  HGB 10.7* 11.4* 11.1*  HCT 34.4* 37.2* 35.2*  MCV 78.5 79.5 79.1  MCH 24.4* 24.4* 24.9*  MCHC 31.1 30.6 31.5  RDW 14.1 14.2 14.7  PLT 157 183 169    Cardiac  Enzymes Recent Labs  Lab 02/10/18 1540 02/10/18 2026 02/11/18 0200 02/14/18 0006  TROPONINI 0.22* 0.19* 0.25* 0.10*    Recent Labs  Lab 02/09/18 1412  TROPIPOC 0.06     BNP Recent Labs  Lab 02/09/18 1351  BNP 1,737.7*    Lab Results  Component Value Date   INR 1.30 02/15/2018   INR 1.21 02/14/2018   INR 1.22 02/13/2018     Radiology    Dg Lumbar Spine 2-3 Views  Result Date: 02/14/2018 CLINICAL DATA:  Fall with low back pain EXAM: LUMBAR SPINE - 2-3 VIEW COMPARISON:  CT 09/29/2012, ultrasound 02/10/2018 FINDINGS: Partially visualized hip replacements. Lumbar alignment is within normal limits. The vertebral body heights are maintained. Disc spaces are preserved. Aortic atherosclerosis. Crescent-shaped calcifications in the right upper quadrant, could reflect mild gallbladder wall calcification. IMPRESSION: 1. No acute osseous abnormality. 2. Peripherally oriented calcifications in the right upper quadrant, possible gallbladder wall calcification as may be seen with porcelain  gallbladder. Electronically Signed   By: Donavan Foil M.D.   On: 02/14/2018 17:27   Dg Chest Port 1 View  Result Date: 02/13/2018 CLINICAL DATA:  Dyspnea for several days EXAM: PORTABLE CHEST 1 VIEW COMPARISON:  02/11/2018 FINDINGS: Stable cardiomegaly with central vascular congestion and small bilateral pleural effusions. No aortic aneurysm. Minimal aortic atherosclerosis. 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 IMPRESSION: Cardiomegaly with mild CHF. Small bilateral pleural effusions. No significant change. Electronically Signed   By: Ashley Royalty M.D.   On: 02/13/2018 23:00    Cardiac Studies    ECHO: 02/10/2018 - Left ventricle: There is marked hypertrophy of the   interventricular septum as well as the papillary muscles with a   speckled pattern to the myocardium suggestion possible   amyloidosis. The cavity size was severely dilated. There was   moderate concentric and severe asymmetric hypertrophy. Systolic   function was moderately to severely reduced. The estimated   ejection fraction was in the range of 30% to 35%. Moderate   diffuse hypokinesis with distinct regional wall motion   abnormalities. There is akinesis of the basal-midinferolateral   myocardium. The study is not technically sufficient to allow   evaluation of LV diastolic function. - Ventricular septum: The contour showed mild diastolic flattening   and mild systolic flattening. These changes are consistent with   RV volume and pressure overload. - Aortic valve: Trileaflet; moderately thickened, moderately   calcified leaflets. - Mitral valve: There was moderate regurgitation. - Left atrium: The atrium was massively dilated. - Right ventricle: The cavity size was severely dilated. Wall   thickness was normal. Systolic function was moderately reduced. - Right atrium: The atrium was massively dilated. - Tricuspid valve: There was moderate regurgitation. - Pulmonic valve: There was trivial  regurgitation. - Pulmonary arteries: PA peak pressure: 47 mm Hg (S). - Pericardium, extracardiac: A trivial, free-flowing pericardial   effusion was identified along the right atrial free wall. The   fluid had no internal echoes. - Impressions: 4 chamber enlargement with marked LVH and speckled   pattern of LV myocardium as well as trivial pericardial effusion   suggestive of infiltrative disease such as amyloidosis. Compared   to prior echo, LV and RV function have significantly declined.   Recommend cardiac MRI.  Impressions:  - 4 chamber enlargement with marked LVH and speckled pattern of LV   myocardium as well as trivial pericardial effusion suggestive of   infiltrative disease such as amyloidosis. Compared to prior echo,  LV and RV function have significantly declined. Recommend cardiac   MRI. The right ventricular systolic pressure was increased   consistent with moderate pulmonary hypertension.  Patient Profile     57 year old gentleman with a history of CAD, s/p MI- PCI in 2016. He stopped his medications after one month. Other medical problems include, HTN, CRI-3. He presented 02/09/18 with respiratory distress.  He was noted to have hypertension and rapid atrial flutter at that time.  He had a profound lactic acidosis, acute on chronic renal insufficiency, elevated LFTS and cholelithiasis.   Assessment & Plan   1. Atrial flutter with rapid ventricular response.  For cardioversion today   2. Acute systolic congestive heart failure:  We have diuresed him fairly well.  Continue diuresis for now  .  3.  Acute on chronic renal insufficiency:  Watching renal function closely      5.  H/O CAD- s/p MI May 2016 treated with LAD PCI followed by staged CFX PCI June 2016 secondary to CRI.     Mertie Moores, MD  02/16/2018 10:34 AM    [address],  [place], [place]  [address] Pager [phone] Phone: [phone]; Fax:  [phone]

## 2018-02-15 NOTE — Progress Notes (Signed)
  Echocardiogram Echocardiogram Transesophageal has been performed.  [name] 02/15/2018, 2:35 PM

## 2018-02-15 NOTE — Progress Notes (Addendum)
Prior to going to Endo for TEE/DCCV, patient removed his bridge and the Nurse Tech placed bridge in pink denture cup (around 11am).  Thirty minutes after his return from Endo, patient called out  and said his bridge was left downstairs.  Patient stated he put his bridge in before going downstairs, and they took it out before performing the TEE in the Endo procedure room.  Patient states that there were three nurses in the room at the time, and the CRNA removed his partial bridge.  This RN went down to Endoscopy and did not find the bridge in any of the rooms or  procedure rooms.  Called one of the Endo nurses at home (they had already left for the day), and she stated that she did not see anyone take out the patient's bridge.  Updated patient and son Paul [name].  Will follow-up tomorrow morning when the Endo nurses and CRNA return to work.  Searched patient's room on [place] as well, and did not locate the bridge.

## 2018-02-15 NOTE — Anesthesia Procedure Notes (Signed)
Procedure Name: MAC Date/Time: 02/15/2018 1:51 PM Performed by: Neldon Newport, CRNA Pre-anesthesia Checklist: Timeout performed, Patient being monitored, Emergency Drugs available, Suction available and Patient identified Patient Re-evaluated:Patient Re-evaluated prior to induction Oxygen Delivery Method: Nasal cannula

## 2018-02-16 ENCOUNTER — Inpatient Hospital Stay (HOSPITAL_COMMUNITY): Payer: Medicare HMO

## 2018-02-16 ENCOUNTER — Encounter (HOSPITAL_COMMUNITY): Payer: Self-pay | Admitting: Cardiovascular Disease

## 2018-02-16 ENCOUNTER — Ambulatory Visit (HOSPITAL_COMMUNITY): Payer: Medicare HMO

## 2018-02-16 DIAGNOSIS — J9601 Acute respiratory failure with hypoxia: Secondary | ICD-10-CM

## 2018-02-16 LAB — BASIC METABOLIC PANEL
Anion gap: 15 (ref 5–15)
BUN: 52 mg/dL — AB (ref 6–20)
CHLORIDE: 98 mmol/L — AB (ref 101–111)
CO2: 24 mmol/L (ref 22–32)
CREATININE: 4.5 mg/dL — AB (ref 0.61–1.24)
Calcium: 8.9 mg/dL (ref 8.9–10.3)
GFR calc Af Amer: 15 mL/min — ABNORMAL LOW (ref >60)
GFR, EST NON AFRICAN AMERICAN: 13 mL/min — AB (ref >60)
Glucose, Bld: 120 mg/dL — ABNORMAL HIGH (ref 65–99)
Potassium: 4.7 mmol/L (ref 3.5–5.1)
SODIUM: 137 mmol/L (ref 135–145)

## 2018-02-16 LAB — PROTIME-INR
INR: 1.4
Prothrombin Time: 17 seconds — ABNORMAL HIGH (ref 11.4–15.2)

## 2018-02-16 LAB — COMPREHENSIVE METABOLIC PANEL
ALBUMIN: 3.4 g/dL — AB (ref 3.5–5.0)
ALT: 622 U/L — ABNORMAL HIGH (ref 17–63)
AST: 191 U/L — AB (ref 15–41)
Alkaline Phosphatase: 64 U/L (ref 38–126)
Anion gap: 16 — ABNORMAL HIGH (ref 5–15)
BUN: 48 mg/dL — ABNORMAL HIGH (ref 6–20)
CHLORIDE: 96 mmol/L — AB (ref 101–111)
CO2: 24 mmol/L (ref 22–32)
Calcium: 9 mg/dL (ref 8.9–10.3)
Creatinine, Ser: 4.38 mg/dL — ABNORMAL HIGH (ref 0.61–1.24)
GFR calc Af Amer: 16 mL/min — ABNORMAL LOW (ref >60)
GFR calc non Af Amer: 13 mL/min — ABNORMAL LOW (ref >60)
GLUCOSE: 108 mg/dL — AB (ref 65–99)
POTASSIUM: 5.3 mmol/L — AB (ref 3.5–5.1)
SODIUM: 136 mmol/L (ref 135–145)
Total Bilirubin: 2.8 mg/dL — ABNORMAL HIGH (ref 0.3–1.2)
Total Protein: 6.4 g/dL — ABNORMAL LOW (ref 6.5–8.1)

## 2018-02-16 LAB — UPEP/UIFE/LIGHT CHAINS/TP, 24-HR UR
% BETA, Urine: 25.1 %
ALPHA 1 URINE: 6.2 %
Albumin, U: 39.8 %
Alpha 2, Urine: 15.9 %
FREE KAPPA LT CHAINS, UR: 380 mg/L — AB (ref 1.35–24.19)
FREE KAPPA/LAMBDA RATIO: 8.82 (ref 2.04–10.37)
Free Lambda Lt Chains,Ur: 43.1 mg/L — ABNORMAL HIGH (ref 0.24–6.66)
GAMMA GLOBULIN URINE: 13 %
Total Protein, Urine-Ur/day: 941 mg/24 hr — ABNORMAL HIGH (ref 30–150)
Total Protein, Urine: 52.3 mg/dL
Total Volume: 1800

## 2018-02-16 LAB — URINALYSIS, ROUTINE W REFLEX MICROSCOPIC
Bilirubin Urine: NEGATIVE
Glucose, UA: NEGATIVE mg/dL
Hgb urine dipstick: NEGATIVE
KETONES UR: NEGATIVE mg/dL
Leukocytes, UA: NEGATIVE
Nitrite: NEGATIVE
Protein, ur: 30 mg/dL — AB
Specific Gravity, Urine: 1.014 (ref 1.005–1.030)
pH: 5 (ref 5.0–8.0)

## 2018-02-16 LAB — CBC
HEMATOCRIT: 37.5 % — AB (ref 39.0–52.0)
Hemoglobin: 11.9 g/dL — ABNORMAL LOW (ref 13.0–17.0)
MCH: 24.8 pg — ABNORMAL LOW (ref 26.0–34.0)
MCHC: 31.7 g/dL (ref 30.0–36.0)
MCV: 78.3 fL (ref 78.0–100.0)
Platelets: 214 10*3/uL (ref 150–400)
RBC: 4.79 MIL/uL (ref 4.22–5.81)
RDW: 14.7 % (ref 11.5–15.5)
WBC: 7.9 10*3/uL (ref 4.0–10.5)

## 2018-02-16 LAB — PROTEIN, URINE, RANDOM: Total Protein, Urine: 62 mg/dL

## 2018-02-16 LAB — NA AND K (SODIUM & POTASSIUM), RAND UR
POTASSIUM UR: 42 mmol/L
SODIUM UR: 47 mmol/L

## 2018-02-16 LAB — CULTURE, BLOOD (ROUTINE X 2)
CULTURE: NO GROWTH
Culture: NO GROWTH
Culture: NO GROWTH
SPECIAL REQUESTS: ADEQUATE
Special Requests: ADEQUATE
Special Requests: ADEQUATE

## 2018-02-16 LAB — HEPARIN LEVEL (UNFRACTIONATED): HEPARIN UNFRACTIONATED: 0.35 [IU]/mL (ref 0.30–0.70)

## 2018-02-16 LAB — CREATININE, URINE, RANDOM: CREATININE, URINE: 198.69 mg/dL

## 2018-02-16 LAB — OSMOLALITY, URINE: Osmolality, Ur: 380 mOsm/kg (ref 300–900)

## 2018-02-16 MED ORDER — TECHNETIUM TC 99M PYROPHOSPHATE
21.4000 | Freq: Once | INTRAVENOUS | Status: AC
  Administered 2018-02-16: 21.4 via INTRAVENOUS

## 2018-02-16 MED ORDER — AMOXICILLIN-POT CLAVULANATE 500-125 MG PO TABS
1.0000 | ORAL_TABLET | Freq: Two times a day (BID) | ORAL | Status: AC
  Administered 2018-02-16 (×2): 500 mg via ORAL
  Filled 2018-02-16 (×2): qty 1

## 2018-02-16 MED ORDER — WARFARIN SODIUM 10 MG PO TABS
10.0000 mg | ORAL_TABLET | Freq: Once | ORAL | Status: AC
  Administered 2018-02-16: 10 mg via ORAL
  Filled 2018-02-16: qty 1

## 2018-02-16 MED ORDER — TRAMADOL HCL 50 MG PO TABS
25.0000 mg | ORAL_TABLET | Freq: Four times a day (QID) | ORAL | Status: DC | PRN
  Administered 2018-02-16 – 2018-02-18 (×5): 25 mg via ORAL
  Filled 2018-02-16 (×5): qty 1

## 2018-02-16 NOTE — Progress Notes (Signed)
Nephrology was consulted on this patient in setting of worsening serum creatinine.  Spoke with Dr. Posey Pronto by phone.  He recommended initial workup including random urine electrolytes, urine creatinine, and renal ultrasound.  Nephrology plans to see the patient later.  Appreciate further recommendations.

## 2018-02-16 NOTE — Progress Notes (Signed)
Progress Note  Patient Name: Paul [name] Date of Encounter: 02/15/2018  Primary Cardiologist: Larae Grooms, MD   Subjective   57 year old gentleman with a history ofCAD, s/p MI- PCI in 2016. He stopped his medications after one month. Other medical problems include,HTN, CRI-3   Sitting up in bed- he does not appear to SOB at rest I have reviewed the echocardiogram from earlier this week.  He has a bright speckled pattern-consistent with with amyloidosis.  He was successfully cardioverted yesterday.  Unfortunately, his renal function has deteriorated further.  Creatinine is 4.38 today.  Inpatient Medications    Scheduled Meds: . amoxicillin-clavulanate  2 tablet Oral BID  . furosemide  80 mg Intravenous BID  . mouth rinse  15 mL Mouth Rinse BID  . metoprolol tartrate  50 mg Oral Q6H  . pantoprazole  40 mg Oral Q1200  . sodium chloride flush  3 mL Intravenous Q12H  . sucralfate  1 g Oral TID WC & HS  . warfarin  7.5 mg Oral ONCE-1800  . Warfarin - Pharmacist Dosing Inpatient   Does not apply q1800   Continuous Infusions: . sodium chloride    . sodium chloride 20 mL/hr at 02/14/18 2300  . sodium chloride    . heparin 1,450 Units/hr (02/14/18 1403)   PRN Meds: sodium chloride, alum & mag hydroxide-simeth, oxyCODONE, sodium chloride flush   Vital Signs          Vitals:   02/14/18 2256 02/15/18 0016 02/15/18 0429 02/15/18 0654  BP: 112/71 103/75 110/84 110/84  Pulse: (!) 117 (!) 117 (!) 116 (!) 114  Resp:      Temp:  97.6 F (36.4 C)  97.8 F (36.6 C)  TempSrc:  Oral  Oral  SpO2: (!) 88% 91% 94% 97%  Weight:    209 lb 10.5 oz (95.1 kg)  Height:        Intake/Output Summary (Last 24 hours) at 02/15/2018 0810 Last data filed at 02/15/2018 0655    Gross per 24 hour  Intake 930 ml  Output 5950 ml  Net -5020 ml        Filed Weights   02/13/18 0437 02/14/18 0557 02/15/18 0654  Weight: 204 lb 9.4 oz (92.8 kg) 209 lb  3.5 oz (94.9 kg) 209 lb 10.5 oz (95.1 kg)    Telemetry    NSR occ PVCs - Personally Reviewed  ECG    NSR - Personally Reviewed  Physical Exam   HXT:AVWPVX age male,  NAD  Neck: mild JVD Cardiac:IRR   Respiratory:mostly clear   YI:AXKP, nontender, non-distended  MS: trace edema  Neuro:Nonfocal  Psych: Normal affect   Labs    Chemistry LastLabs       Recent Labs  Lab 02/13/18 0301 02/14/18 0006 02/15/18 0327  NA 138 137 140  K 4.3 4.9 4.4  CL 104 103 98*  CO2 $Re'24 24 29  'Ydf$ GLUCOSE 120* 128* 109*  BUN 41* 39* 40*  CREATININE 3.01* 3.05* 3.12*  CALCIUM 7.9* 8.4* 8.8*  PROT 5.6* 6.5 6.1*  ALBUMIN 2.9* 3.4* 3.1*  AST 313* 330* 327*  ALT 739* 777* 790*  ALKPHOS 59 66 65  BILITOT 3.1* 2.7* 2.3*  GFRNONAA 21* 21* 20*  GFRAA 25* 24* 24*  ANIONGAP $RemoveB'10 10 13       'TNiJVLNp$ Hematology LastLabs       Recent Labs  Lab 02/13/18 0301 02/14/18 0006 02/15/18 0327  WBC 6.8 8.1 6.5  RBC 4.38 4.68 4.45  HGB 10.7*  11.4* 11.1*  HCT 34.4* 37.2* 35.2*  MCV 78.5 79.5 79.1  MCH 24.4* 24.4* 24.9*  MCHC 31.1 30.6 31.5  RDW 14.1 14.2 14.7  PLT 157 183 169      Cardiac Enzymes LastLabs        Recent Labs  Lab 02/10/18 1540 02/10/18 2026 02/11/18 0200 02/14/18 0006  TROPONINI 0.22* 0.19* 0.25* 0.10*      LastLabs     Recent Labs  Lab 02/09/18 1412  TROPIPOC 0.06       BNP LastLabs     Recent Labs  Lab 02/09/18 1351  BNP 1,737.7*      RecentLabs       Lab Results  Component Value Date   INR 1.30 02/15/2018   INR 1.21 02/14/2018   INR 1.22 02/13/2018       Radiology     ImagingResults(Last48hours)  Dg Lumbar Spine 2-3 Views  Result Date: 02/14/2018 CLINICAL DATA:  Fall with low back pain EXAM: LUMBAR SPINE - 2-3 VIEW COMPARISON:  CT 09/29/2012, ultrasound 02/10/2018 FINDINGS: Partially visualized hip replacements. Lumbar alignment is within normal limits. The vertebral body heights are maintained. Disc spaces  are preserved. Aortic atherosclerosis. Crescent-shaped calcifications in the right upper quadrant, could reflect mild gallbladder wall calcification. IMPRESSION: 1. No acute osseous abnormality. 2. Peripherally oriented calcifications in the right upper quadrant, possible gallbladder wall calcification as may be seen with porcelain gallbladder. Electronically Signed   By: Donavan Foil M.D.   On: 02/14/2018 17:27   Dg Chest Port 1 View  Result Date: 02/13/2018 CLINICAL DATA:  Dyspnea for several days EXAM: PORTABLE CHEST 1 VIEW COMPARISON:  02/11/2018 FINDINGS: Stable cardiomegaly with central vascular congestion and small bilateral pleural effusions. No aortic aneurysm. Minimal aortic atherosclerosis. 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 IMPRESSION: Cardiomegaly with mild CHF. Small bilateral pleural effusions. No significant change. Electronically Signed   By: Ashley Royalty M.D.   On: 02/13/2018 23:00     Cardiac Studies    ECHO: 02/10/2018 - Left ventricle: There is marked hypertrophy of the interventricular septum as well as the papillary muscles with a speckled pattern to the myocardium suggestion possible amyloidosis. The cavity size was severely dilated. There was moderate concentric and severe asymmetric hypertrophy. Systolic function was moderately to severely reduced. The estimated ejection fraction was in the range of 30% to 35%. Moderate diffuse hypokinesis with distinct regional wall motion abnormalities. There is akinesis of the basal-midinferolateral myocardium. The study is not technically sufficient to allow evaluation of LV diastolic function. - Ventricular septum: The contour showed mild diastolic flattening and mild systolic flattening. These changes are consistent with RV volume and pressure overload. - Aortic valve: Trileaflet; moderately thickened, moderately calcified leaflets. - Mitral valve: There was  moderate regurgitation. - Left atrium: The atrium was massively dilated. - Right ventricle: The cavity size was severely dilated. Wall thickness was normal. Systolic function was moderately reduced. - Right atrium: The atrium was massively dilated. - Tricuspid valve: There was moderate regurgitation. - Pulmonic valve: There was trivial regurgitation. - Pulmonary arteries: PA peak pressure: 47 mm Hg (S). - Pericardium, extracardiac: A trivial, free-flowing pericardial effusion was identified along the right atrial free wall. The fluid had no internal echoes. - Impressions: 4 chamber enlargement with marked LVH and speckled pattern of LV myocardium as well as trivial pericardial effusion suggestive of infiltrative disease such as amyloidosis. Compared to prior echo, LV and RV function have significantly declined. Recommend cardiac MRI.  Impressions:  -  4 chamber enlargement with marked LVH and speckled pattern of LV myocardium as well as trivial pericardial effusion suggestive of infiltrative disease such as amyloidosis. Compared to prior echo, LV and RV function have significantly declined. Recommend cardiac MRI. The right ventricular systolic pressure was increased consistent with moderate pulmonary hypertension.  Patient Profile     57 year old gentleman with a history of CAD, s/p MI- PCI in 2016. He stopped his medications after one month. Other medical problems include, HTN, CRI-3. He presented 02/09/18 with respiratory distress.  He was noted to have hypertension and rapid atrial flutter at that time.  He had a profound lactic acidosis, acute on chronic renal insufficiency, elevated LFTS and cholelithiasis.   Assessment & Plan   1. Atrial flutter with rapid ventricular response.  Status post cardioversion.  Seems to be doing well.  2. Acute systolic congestive heart failure:  We have diuresed him fairly well.  His creatinine has increased slightly  to 4.38.  We will hold Lasix today.  3.  Acute on chronic renal insufficiency: Creatinine is 4.38.  Holding Lasix today.  Further recommendations per the internal medicine team/  nephrology.    5.  H/O CAD- s/p MI May 2016 treated with LAD PCI followed by staged CFX PCI June 2016 secondary to CRI.     Mertie Moores, MD  02/16/2018 10:34 AM    [address],  [place], [place]  [address] Pager [phone] Phone: [phone]; Fax: [phone]

## 2018-02-16 NOTE — Progress Notes (Addendum)
Family Medicine Teaching Service Daily Progress Note Intern Pager: [phone]  Patient name: Paul [name] Medical record number: [id number] Date of birth: 25-May-1961 Age: 57 y.o. Gender: male  Primary Care Provider: Rogue Bussing, MD Consultants: Cardiology, CCM Code Status: Full  Pt Overview and Major Events to Date:  Paul [name] is a 57 y.o. male presenting with shortness of breath and edema. PMH is significant for the CHF, CAD s/p LAD DES in 5/ 2016, hypertension, PAF, hyperlipidemia, CKD 4, tobacco use and arthritis  Assessment and Plan:  Atrial flutter w/ RVR, improved S/p cardioversion. O/n, sinus Bradycardic 50s and low normal htn 90s/70s, TEE results pending BP low normal, with some hypotension yesterday afternoon -holding metoprolol this AM due to bradycardia, plan to restart beta-blocker tomorrow likely Coreg as discussed with cardiology -appreciate cardiology recs -On warfarin due to elevated creatinine, possibly switch to DOAc as renal fxn improves < 2.0  CHF, worse EF this admission, EF 35-40% -SORA, wt down 2 lbs on 4/11, lower extremity edema improved with TED hose. UOP yesterday 2 L. Worsing Cr 3.1 > 4.3 this on 4/11. Holding lasix insetting of worsening Cr and low BP - consider nephrology consult -Cardiology recommendations -daily wt -strict I/Os -elevate legs, Ted hose for lower extremity edema, elevate scrotom  Shock with multiple organ failure including elevated LFTs Abdominal pain resolved, LFT stable.  -monitor CMP daily -Avoid Tylenol in setting of elevated LFTs  AKI on CKD 2, stable Creatinine baseline 2.0. Cr 3.05>3.12>4.3 on 4/11, elevation likely evelvated in setting of lasix vs. CHF/hypoprofusion injury during stay. Patient had elevated beta-2 slightly elevated microglobulin 4.2, UPEP/SPEP pending for multiple myeloma workup. Monitor in the setting diuresis. MM Panel (SPEP) has low protein, indicative of not being MM. UPEP  pending -UPEP sent -BMP daily  CAD, stable Continue atorvastatin and metoprolol  Basilar Infiltrates on CXR Afebrile. No leukocytosis. CXR on admission showed diffuse basilar infiltrates consistatnt with edema/CHF. Repeat CXR showed improvement and resolution of infiltrates. Procalcitonin elevated 6 on 4/7. GPC on BCx1 on 4/5, no growth on repeat blood cultures x2 for to date days.   -Zosyn (4/5-4/9), cont augmentin for 7 days (4/9-4/11)  Right arm pain, stable During transfer patient fell out of wheelchair and IV got pulled out.  Patient has ongoing pain in his right arm to this. -pain well controlled  Chronic back pain, stable Patient reports ongoing lower back pain.  He has had multiple hip surgeries and will use oxycodone at home for this pain.  Not being able to get around. Per discussion with PCP, pt was stable not on pain medication in outpatient setting. Will hold oxycodone in the setting of hypotension, low bp, attempt to wean pain medications -oxycodone IR 5 mg q6 prn -PT/OT - No outpatient PT  FEN/GI: HHD PPx: Heparin GTT with bridge to warfarn per pharmacy  Disposition: Inpatient to monitor HR and BP after cardioversion  Subjective:  Denies CP and abdominal pain. Pt lost bridge for dentures during transport to procedure yesterday. Pt is very upset about this. Says it is more dificult to eat food and he has bitten his tongue. He does not want a soft diet. Otherwise no other issues.   Objective: Temp:  [96.6 F (35.9 C)-99.1 F (37.3 C)] 98.5 F (36.9 C) (04/11 0521) Pulse Rate:  [38-120] 56 (04/11 0521) Resp:  [17-27] 18 (04/10 1550) BP: (65-127)/(44-91) 90/73 (04/11 0521) SpO2:  [91 %-100 %] 95 % (04/11 0521) Physical Exam: Gen: NAD, resting comfortably CV:  bradycardic, with no murmurs appreciated, JVD to skull Pulm: NWOB, mild crackles in lower bases GI: soft, nontender, Nondistended. MSK: Periferal IVs in both arms, 2+ lower extremity edema with TED  hose Neuro: grossly normal, moves all extremities Psych: Normal affect and thought content  Laboratory: Recent Labs  Lab 02/14/18 0006 02/15/18 0327 02/16/18 0413  WBC 8.1 6.5 7.9  HGB 11.4* 11.1* 11.9*  HCT 37.2* 35.2* 37.5*  PLT 183 169 214   Recent Labs  Lab 02/14/18 0006 02/15/18 0327 02/16/18 0413  NA 137 140 136  K 4.9 4.4 5.3*  CL 103 98* 96*  CO2 _0 BUN 39* 40* 48*  CREATININE 3.05* 3.12* 4.38*  CALCIUM 8.4* 8.8* 9.0  PROT 6.5 6.1* 6.4*  BILITOT 2.7* 2.3* 2.8*  ALKPHOS 66 65 64  ALT 777* 790* 622*  AST 330* 327* 191*  GLUCOSE 128* 109* 108*    Bonnita Hollow, MD 02/16/2018, 7:20 AM PGY-1, [facility] Intern pager: [phone], text pages welcome

## 2018-02-16 NOTE — Progress Notes (Addendum)
ANTICOAGULATION CONSULT NOTE  Pharmacy Consult:  Heparin / Coumadin Indication: atrial fibrillation  Allergies  Allergen Reactions  . Ibuprofen Other (See Comments)    Kidney damage  . Nsaids     REACTION: nsaid induced nephropathy    Patient Measurements: Height: 6' (182.9 cm) Weight: 209 lb 10.5 oz (95.1 kg) IBW/kg (Calculated) : 77.6  Vital Signs: Temp: 98.5 F (36.9 C) (04/11 0521) Temp Source: Oral (04/11 0521) BP: 90/73 (04/11 0521) Pulse Rate: 56 (04/11 0521)  Labs: Recent Labs    02/14/18 0006 02/15/18 0327 02/16/18 0413  HGB 11.4* 11.1* 11.9*  HCT 37.2* 35.2* 37.5*  PLT 183 169 214  LABPROT 15.2 16.1* 17.0*  INR 1.21 1.30 1.40  HEPARINUNFRC 0.54 0.46 0.35  CREATININE 3.05* 3.12* 4.38*  TROPONINI 0.10*  --   --     Estimated Creatinine Clearance: 21.7 mL/min (A) (by C-G formula based on SCr of 4.38 mg/dL (H)).   Assessment: 41 YOM went to PCP for SOB and CP with LE edema and was sent to the ED for acute CHF +/- atrial flutter.  Patient has a history of PAF however was not on any anticoagulation.  Now on IV heparin bridge to Coumadin.  Heparin level therapeutic and trending down.  INR is sub-therapeutic and increasing slowly.  LFTs improving; no bleeding reported.   Goal of Therapy:  INR 2-3 Heparin level 0.3-0.7 units/ml Monitor platelets by anticoagulation protocol: Yes    Plan:  Coumadin $RemoveB'10mg'uZePlkDV$  PO today Increase heparin gtt slightly to 1500 units/hr Daily heparin level, CBC, PT / INR Reduce Augmentin to $RemoveBefo'500mg'ASoraEvFVfQ$  PO BID x 2 more doses to complete treatment course   Ishaq Maffei D. Mina Marble, PharmD, BCPS Pager:  [phone] 02/16/2018, 7:12 AM

## 2018-02-16 NOTE — Consult Note (Signed)
Referring Provider: No ref. provider found Primary Care Physician:  Rogue Bussing, MD Primary Nephrologist:      Reason for Consultation:  Acute Kidney Injury  Hypotension   HPI:  57 y.o. male presenting with shortness of breath and edema. PMH is significant for the CHF, CAD s/p LAD DES in 5/ 2016, hypertension, PAF, hyperlipidemia, CKD 4, tobacco use and arthritis Baseline creatinine of 2.1 07/13/17  He was admitted with creatinine of 2   02/09/18  He underwent successful cardioversion 4/10  He has had good urine output.  Creatinine 2.95   4/5   Creatinine  3.44  4/6  Creatinine  3.01  4/8 Creatinine  3.12  4/10 Creatinine  4.38   4/11   Renal ultrasound no hydronephrosis  4/5  + cholelithiasis  2 D Echo  EF 30%  He has some LVH and speckled appearance to myocardium and trivial pericardial effusion. Work up for myeloma protein in progress  [name] However patient was placed on Zosyn and also augementin for pulmonary infiltrates   Urinalysis unremarkable        Past Medical History:  Diagnosis Date  . Arthritis   . CAD (coronary artery disease)    a. 03/2015 NSTEMI/PCI: LM nl, LAD 95p (3.5x20 Promus DES), D1 60, D2/3 mild dzs, LCX 68m (plan for staged pci), OM1/2 min irregs, RCA 20p, RPDA 60.  Marland Kitchen CKD (chronic kidney disease), stage III ([place])   . GERD (gastroesophageal reflux disease)    occ  . HOCM (hypertrophic obstructive cardiomyopathy) ([place])    a. 03/2015 Echo: EF 65-60%, sev LV thickening w/ bright, speckled appearance of IV septum, Gr 2 DD, AoV sclerosis, triv AI, mild MR, LA 42ml/m^2, RA 49ml/m^2, aneurysmal IAS, mod TR, PASP 47mmHg.  Marland Kitchen Hypertension    denies  . PAF (paroxysmal atrial fibrillation) ([place])    remote isolated episode - 09/2012.  . TMJ arthralgia     Past Surgical History:  Procedure Laterality Date  . APPENDECTOMY    . CARDIAC CATHETERIZATION N/A 03/19/2015   Procedure: Left Heart Cath and Coronary Angiography;   Surgeon: [name], MD;  Location: [facility];  Service: Cardiovascular;  Laterality: N/A;  . CARDIAC CATHETERIZATION  03/19/2015   Procedure: Coronary Stent Intervention;  Surgeon: [name], MD;  Location: [facility];  Service: Cardiovascular;;  . CARDIAC CATHETERIZATION N/A 04/09/2015   Procedure: Coronary Stent Intervention;  Surgeon: [name], MD;  Location: [facility];  Service: Cardiovascular;  Laterality: N/A;  . CARDIOVERSION N/A 02/15/2018   Procedure: CARDIOVERSION;  Surgeon: [name], MD;  Location: [facility];  Service: Cardiovascular;  Laterality: N/A;  . HIP SURGERY Left 74   lft   . LAPAROSCOPIC APPENDECTOMY  09/22/2012   Procedure: APPENDECTOMY LAPAROSCOPIC;  Surgeon: Harl Bowie, MD;  Location: [facility];  Service: General;;  . LAPAROSCOPY  09/22/2012   Procedure: LAPAROSCOPY DIAGNOSTIC;  Surgeon: Harl Bowie, MD;  Location: [facility];  Service: General;  Laterality: N/A;  . TEE WITHOUT CARDIOVERSION N/A 02/15/2018   Procedure: TRANSESOPHAGEAL ECHOCARDIOGRAM (TEE);  Surgeon: [name], MD;  Location: [facility];  Service: Cardiovascular;  Laterality: N/A;  . TOTAL HIP ARTHROPLASTY Right 03,01   x2  . TOTAL HIP ARTHROPLASTY WITH HARDWARE REMOVAL Left 08/05/2014   Procedure: TOTAL HIP ARTHROPLASTY WITH HARDWARE REMOVAL;  Surgeon: Kerin Salen, MD;  Location: [facility];  Service: Orthopedics;  Laterality: Left;  Prior to Admission medications   Medication Sig Start Date End Date Taking? Authorizing Provider  sildenafil (VIAGRA) 100 MG tablet Take 1 tablet (100 mg total) by mouth daily as needed for erectile dysfunction. 30 min-4 hrs before sexual activity. Do not use with nitro. 10/21/17  Yes Rogue Bussing, MD  oxyCODONE-acetaminophen (PERCOCET/ROXICET) 5-325 MG tablet Take 1 tablet by mouth as needed. 10/18/12   [provider]    Current Facility-Administered Medications  Medication  Dose Route Frequency Provider Last Rate Last Dose  . 0.9 %  sodium chloride infusion   Intravenous PRN [name], MD      . amoxicillin-clavulanate (AUGMENTIN) 500-125 MG per tablet 500 mg  1 tablet Oral BID Dang, Thuy D, RPH   500 mg at 02/16/18 1035  . heparin ADULT infusion 100 units/mL (25000 units/275mL sodium chloride 0.45%)  1,500 Units/hr Intravenous Continuous Tyrone Apple, RPH 15 mL/hr at 02/16/18 0739 1,500 Units/hr at 02/16/18 0739  . MEDLINE mouth rinse  15 mL Mouth Rinse BID [name], MD   15 mL at 02/13/18 2154  . pantoprazole (PROTONIX) EC tablet 40 mg  40 mg Oral Q1200 [name], MD   40 mg at 02/16/18 0519  . pneumococcal 23 valent vaccine (PNU-IMMUNE) injection 0.5 mL  0.5 mL Intramuscular Tomorrow-1000 [name], MD      . sucralfate (CARAFATE) 1 GM/10ML suspension 1 g  1 g Oral TID WC & HS [name], MD   1 g at 02/16/18 1145  . traMADol (ULTRAM) tablet 25 mg  25 mg Oral Q6H PRN Rory Percy, DO   25 mg at 02/16/18 1109  . warfarin (COUMADIN) tablet 10 mg  10 mg Oral ONCE-1800 Tyrone Apple, RPH      . Warfarin - Pharmacist Dosing Inpatient   Does not apply [id number] [name], MD        Allergies as of 02/09/2018 - Review Complete 02/09/2018  Allergen Reaction Noted  . Ibuprofen Other (See Comments) 04/21/2012  . Nsaids  09/18/2007    Family History  Problem Relation Age of Onset  . Heart disease Mother   . Heart disease Father     Social History   Socioeconomic History  . Marital status: Divorced    Spouse name: Not on file  . Number of children: Not on file  . Years of education: Not on file  . Highest education level: Not on file  Occupational History  . Not on file  Social Needs  . Financial resource strain: Not on file  . Food insecurity:    Worry: Patient refused    Inability: Patient refused  . Transportation needs:    Medical: Patient refused    Non-medical: Patient refused  Tobacco Use  . Smoking  status: Light Tobacco Smoker    Packs/day: 0.25    Years: 10.00    Pack years: 2.50    Types: Cigarettes  . Smokeless tobacco: Never Used  Substance and Sexual Activity  . Alcohol use: Yes    Alcohol/week: 1.2 oz    Types: 2 Cans of beer per week    Comment: occasional  . Drug use: No  . Sexual activity: Not Currently  Lifestyle  . Physical activity:    Days per week: Patient refused    Minutes per session: Patient refused  . Stress: Not on file  Relationships  . Social connections:    Talks on phone: Patient refused    Gets together: Patient refused  Attends religious service: Patient refused    Active member of club or organization: Patient refused    Attends meetings of clubs or organizations: Patient refused    Relationship status: Patient refused  . Intimate partner violence:    Fear of current or ex partner: Patient refused    Emotionally abused: Patient refused    Physically abused: Patient refused    Forced sexual activity: Patient refused  Other Topics Concern  . Not on file  Social History Narrative  . Not on file    Review of Systems: Gen: Denies any fever, chills, sweats, anorexia, fatigue, weakness, malaise, weight loss, and sleep disorder HEENT: No visual complaints, No history of Retinopathy. Normal external appearance No Epistaxis or Sore throat. No sinusitis.   CV: Denies chest pain, angina, palpitations, syncope, orthopnea, PND, peripheral edema, and claudication. Resp: Denies dyspnea at rest, dyspnea with exercise, cough, sputum, wheezing, coughing up blood, and pleurisy. GI: Denies vomiting blood, jaundice, and fecal incontinence.   Denies dysphagia or odynophagia. GU : Denies urinary burning, blood in urine, urinary frequency, urinary hesitancy, nocturnal urination, and urinary incontinence.  No renal calculi. MS: History of bilateral hip replacements   Derm: Denies rash, itching, dry skin, hives, moles, warts, or unhealing ulcers.  Psych: Denies  depression, anxiety, memory loss, suicidal ideation, hallucinations, paranoia, and confusion. Heme: Denies bruising, bleeding, and enlarged lymph nodes. Neuro: No headache.  No diplopia. No dysarthria.  No dysphasia.  No history of CVA.  No Seizures. No paresthesias.  No weakness. Endocrine No DM.  No Thyroid disease.  No Adrenal disease.  Physical Exam: Vital signs in last 24 hours: Temp:  [97.9 F (36.6 C)-99.1 F (37.3 C)] 97.9 F (36.6 C) (04/11 1257) Pulse Rate:  [54-63] 54 (04/11 1257) Resp:  [18-20] 18 (04/10 1550) BP: (90-102)/(69-77) 102/71 (04/11 1257) SpO2:  [95 %-100 %] 98 % (04/11 1257) Weight:  [207 lb 8 oz (94.1 kg)] 207 lb 8 oz (94.1 kg) (04/11 0521) Last BM Date: 02/15/18 General:   Alert,  Well-developed, well-nourished, pleasant and cooperative in NAD Head:  Normocephalic and atraumatic. Eyes:  Sclera clear, no icterus.   Conjunctiva pink. Ears:  Normal auditory acuity. Nose:  No deformity, discharge,  or lesions. Mouth:  No deformity or lesions, dentition normal. Neck:  Supple; no masses or thyromegaly. JVP not elevated Lungs:  Clear throughout to auscultation.   No wheezes, crackles, or rhonchi. No acute distress. Heart:  Regular rate and rhythm; no murmurs, clicks, rubs,  or gallops. Abdomen:  Soft, nontender and nondistended. No masses, hepatosplenomegaly or hernias noted. Normal bowel sounds, without guarding, and without rebound.   Msk:  Symmetrical without gross deformities. Normal posture. Pulses:  No carotid, renal, femoral bruits. DP and PT symmetrical and equal Extremities:  Without clubbing or edema. .  Intake/Output from previous day: 04/10 0701 - 04/11 0700 In: 938.6 [P.O.:340; I.V.:598.6] Out: 2100 [Urine:2100] Intake/Output this shift: Total I/O In: 729.2 [P.O.:600; I.V.:129.2] Out: 275 [Urine:275]  Lab Results: Recent Labs    02/14/18 0006 02/15/18 0327 02/16/18 0413  WBC 8.1 6.5 7.9  HGB 11.4* 11.1* 11.9*  HCT 37.2* 35.2* 37.5*   PLT 183 169 214   BMET Recent Labs    02/14/18 0006 02/15/18 0327 02/16/18 0413  NA 137 140 136  K 4.9 4.4 5.3*  CL 103 98* 96*  CO2 $Re'24 29 24  'Smk$ GLUCOSE 128* 109* 108*  BUN 39* 40* 48*  CREATININE 3.05* 3.12* 4.38*  CALCIUM 8.4* 8.8* 9.0  LFT Recent Labs    02/16/18 0413  PROT 6.4*  ALBUMIN 3.4*  AST 191*  ALT 622*  ALKPHOS 64  BILITOT 2.8*   PT/INR Recent Labs    02/15/18 0327 02/16/18 0413  LABPROT 16.1* 17.0*  INR 1.30 1.40   Hepatitis Panel No results for input(s): HEPBSAG, HCVAB, HEPAIGM, HEPBIGM in the last 72 hours.  Studies/Results: Dg Lumbar Spine 2-3 Views  Result Date: 02/14/2018 CLINICAL DATA:  Fall with low back pain EXAM: LUMBAR SPINE - 2-3 VIEW COMPARISON:  CT 09/29/2012, ultrasound 02/10/2018 FINDINGS: Partially visualized hip replacements. Lumbar alignment is within normal limits. The vertebral body heights are maintained. Disc spaces are preserved. Aortic atherosclerosis. Crescent-shaped calcifications in the right upper quadrant, could reflect mild gallbladder wall calcification. IMPRESSION: 1. No acute osseous abnormality. 2. Peripherally oriented calcifications in the right upper quadrant, possible gallbladder wall calcification as may be seen with porcelain gallbladder. Electronically Signed   By: Donavan Foil M.D.   On: 02/14/2018 17:27    Assessment/Plan:  Acute kidney injury  This appears to be connected to the admission and low blood pressures  Decreased EF and unremarkable urinalysis  No obstruction of ultrasound. The blood pressure does seem labile and possible related to the atrial fibrillation with RVR and diminished renal perfusion.  This is worrisome as I am not sure that dialysis would be tolerated in this very nice gentleman. His ECHO also seemed to indicate possible amyloid and myeloma work up  are at present negative. There is no evidence of AIN on urinalysis  Hypotension agree with holding lasix and metoprolol  Hyperkalemia mild  will follow at present  Recheck in AM  Metabolic acidosis resolved    LOS: 7 Tien Aispuro W $RemoveBefor'@TODAY'[id number]$ '@3'$ :20 PM

## 2018-02-16 NOTE — Evaluation (Signed)
Occupational Therapy Evaluation Patient Details Name: Paul [name] MRN: [id number] DOB: 10-29-1961 Today's Date: 02/16/2018    History of Present Illness Paul STRANAHAN is a 57 y.o. male presenting with shortness of breath and edema. PMH is significant for the CHF, CAD s/p LAD DES in 5/ 2016, hypertension, PAF, hyperlipidemia, CKD 4, tobacco use and arthritis   Clinical Impression   Pt reports he was mod I with ADL PTA. Currently pt close min guard for functional mobility and ADL with the exception of min assist for LB ADL due to bil hip pain and decreased ROM. Pt presenting with decreased activity tolerance, impaired standing balance, deconditioning impacting his independence and safety with ADL and functional mobility. Pt planning to d/c home alone with intermittent supervision from family. Recommending HHOT for follow up to maximize independence and safety with ADL and functional mobility upon return home. Pt would benefit from continued skilled OT to address established goals.    Follow Up Recommendations  Home health OT;Supervision - Intermittent    Equipment Recommendations  Other (comment)(Adaptive equipment)    Recommendations for Other Services       Precautions / Restrictions Precautions Precautions: Fall Restrictions Weight Bearing Restrictions: No      Mobility Bed Mobility Overal bed mobility: Needs Assistance Bed Mobility: Supine to Sit     Supine to sit: Supervision;HOB elevated     General bed mobility comments: Increased time and effort  Transfers Overall transfer level: Needs assistance Equipment used: None Transfers: Sit to/from Stand Sit to Stand: Min guard         General transfer comment: for safety, reports of dizziness in standing    Balance Overall balance assessment: Needs assistance Sitting-balance support: Feet supported;No upper extremity supported Sitting balance-Leahy Scale: Good     Standing balance support: No upper  extremity supported;During functional activity Standing balance-Leahy Scale: Fair                             ADL either performed or assessed with clinical judgement   ADL Overall ADL's : Needs assistance/impaired Eating/Feeding: Set up;Sitting   Grooming: Min guard;Standing   Upper Body Bathing: Set up;Supervision/ safety;Sitting   Lower Body Bathing: Minimal assistance;Sit to/from stand   Upper Body Dressing : Set up;Supervision/safety;Sitting   Lower Body Dressing: Minimal assistance;Sit to/from stand Lower Body Dressing Details (indicate cue type and reason): Difficulty reaching feet in sitting. Reports he typically stands and balances on one foot to get LB clothing on Toilet Transfer: Min guard;Ambulation;BSC;RW   Toileting- Architect and Hygiene: Min guard;Sit to/from stand       Functional mobility during ADLs: Min guard;Rolling walker General ADL Comments: DOE 3/4 with hallway mobility; educated on pursed lip breathing and energy conservation. Unable to obtain SpO2 reacding.     Vision         Perception     Praxis      Pertinent Vitals/Pain Pain Assessment: Faces Faces Pain Scale: Hurts little more Pain Location: bil hips Pain Descriptors / Indicators: Aching;Sore Pain Intervention(s): Monitored during session     Hand Dominance     Extremity/Trunk Assessment Upper Extremity Assessment Upper Extremity Assessment: Overall WFL for tasks assessed   Lower Extremity Assessment Lower Extremity Assessment: Defer to PT evaluation       Communication Communication Communication: No difficulties   Cognition Arousal/Alertness: Awake/alert Behavior During Therapy: WFL for tasks assessed/performed Overall Cognitive Status: Within Functional Limits for tasks assessed  General Comments       Exercises     Shoulder Instructions      Home Living Family/patient expects to be  discharged to:: Private residence Living Arrangements: Alone Available Help at Discharge: Family;Friend(s);Available PRN/intermittently Type of Home: House Home Access: Stairs to enter [employer] of Steps: several Entrance Stairs-Rails: Right;Left Home Layout: One level     Bathroom Shower/Tub: Teacher, early years/pre: Handicapped height     Home Equipment: Cane - single point;Crutches;Shower seat;Grab bars - tub/shower;Grab bars - toilet          Prior Functioning/Environment Level of Independence: Independent        Comments: states there are days that he uses his cane or crutches due to his arthritis        OT Problem List: Decreased strength;Decreased range of motion;Decreased activity tolerance;Impaired balance (sitting and/or standing);Decreased knowledge of use of DME or AE;Cardiopulmonary status limiting activity;Pain      OT Treatment/Interventions: Self-care/ADL training;Energy conservation;DME and/or AE instruction;Therapeutic activities;Patient/family education;Balance training    OT Goals(Current goals can be found in the care plan section) Acute Rehab OT Goals Patient Stated Goal: Home when medically ready OT Goal Formulation: With patient Time For Goal Achievement: 03/02/18 Potential to Achieve Goals: Good ADL Goals Pt Will Perform Lower Body Bathing: with modified independence;with adaptive equipment;sit to/from stand Pt Will Perform Lower Body Dressing: with modified independence;sit to/from stand;with adaptive equipment Pt Will Perform Tub/Shower Transfer: Tub transfer;with modified independence;ambulating;shower seat;grab bars Additional ADL Goal #1: Pt will independently verbally recall 3 energy conservation strategies and utilize during ADL.  OT Frequency: Min 2X/week   Barriers to D/C: Decreased caregiver support  pt lives alone       Co-evaluation              AM-PAC PT "6 Clicks" Daily Activity     Outcome  Measure Help from another person eating meals?: None Help from another person taking care of personal grooming?: A Little Help from another person toileting, which includes using toliet, bedpan, or urinal?: A Little Help from another person bathing (including washing, rinsing, drying)?: A Little Help from another person to put on and taking off regular upper body clothing?: A Little Help from another person to put on and taking off regular lower body clothing?: A Little 6 Click Score: 19   End of Session Nurse Communication: Mobility status  Activity Tolerance: Patient tolerated treatment well Patient left: in bed;with call bell/phone within reach  OT Visit Diagnosis: Unsteadiness on feet (R26.81);Other abnormalities of gait and mobility (R26.89);Pain Pain - Right/Left: (bil) Pain - part of body: Hip                Time: 1035-1106 OT Time Calculation (min): 31 min Charges:  OT General Charges $OT Visit: 1 Visit OT Evaluation $OT Eval Moderate Complexity: 1 Mod OT Treatments $Self Care/Home Management : 8-22 mins G-Codes:     Ehsan Corvin A. Ulice Brilliant, M.S., OTR/L Pager: [place] 02/16/2018, 11:11 AM

## 2018-02-17 ENCOUNTER — Encounter (HOSPITAL_COMMUNITY): Payer: Medicare HMO

## 2018-02-17 LAB — COMPREHENSIVE METABOLIC PANEL
ALBUMIN: 3.2 g/dL — AB (ref 3.5–5.0)
ALK PHOS: 66 U/L (ref 38–126)
ALT: 477 U/L — AB (ref 17–63)
ANION GAP: 13 (ref 5–15)
AST: 98 U/L — ABNORMAL HIGH (ref 15–41)
BUN: 57 mg/dL — ABNORMAL HIGH (ref 6–20)
CALCIUM: 8.7 mg/dL — AB (ref 8.9–10.3)
CHLORIDE: 98 mmol/L — AB (ref 101–111)
CO2: 25 mmol/L (ref 22–32)
Creatinine, Ser: 4.19 mg/dL — ABNORMAL HIGH (ref 0.61–1.24)
GFR calc non Af Amer: 14 mL/min — ABNORMAL LOW (ref >60)
GFR, EST AFRICAN AMERICAN: 16 mL/min — AB (ref >60)
GLUCOSE: 122 mg/dL — AB (ref 65–99)
Potassium: 4 mmol/L (ref 3.5–5.1)
SODIUM: 136 mmol/L (ref 135–145)
Total Bilirubin: 1.8 mg/dL — ABNORMAL HIGH (ref 0.3–1.2)
Total Protein: 6 g/dL — ABNORMAL LOW (ref 6.5–8.1)

## 2018-02-17 LAB — IRON AND TIBC
IRON: 30 ug/dL — AB (ref 45–182)
SATURATION RATIOS: 7 % — AB (ref 17.9–39.5)
TIBC: 413 ug/dL (ref 250–450)
UIBC: 383 ug/dL

## 2018-02-17 LAB — CBC
HCT: 32.8 % — ABNORMAL LOW (ref 39.0–52.0)
HEMOGLOBIN: 10.6 g/dL — AB (ref 13.0–17.0)
MCH: 24.9 pg — AB (ref 26.0–34.0)
MCHC: 32.3 g/dL (ref 30.0–36.0)
MCV: 77.2 fL — ABNORMAL LOW (ref 78.0–100.0)
Platelets: 190 10*3/uL (ref 150–400)
RBC: 4.25 MIL/uL (ref 4.22–5.81)
RDW: 14.7 % (ref 11.5–15.5)
WBC: 7.3 10*3/uL (ref 4.0–10.5)

## 2018-02-17 LAB — HEPARIN LEVEL (UNFRACTIONATED): HEPARIN UNFRACTIONATED: 0.77 [IU]/mL — AB (ref 0.30–0.70)

## 2018-02-17 LAB — CALCIUM, URINE, RANDOM

## 2018-02-17 LAB — PROTIME-INR
INR: 2.04
Prothrombin Time: 22.8 seconds — ABNORMAL HIGH (ref 11.4–15.2)

## 2018-02-17 LAB — UREA NITROGEN, URINE: UREA NITROGEN UR: 454 mg/dL

## 2018-02-17 LAB — FERRITIN: FERRITIN: 76 ng/mL (ref 24–336)

## 2018-02-17 MED ORDER — WARFARIN SODIUM 7.5 MG PO TABS
7.5000 mg | ORAL_TABLET | Freq: Once | ORAL | Status: AC
  Administered 2018-02-17: 7.5 mg via ORAL
  Filled 2018-02-17: qty 1

## 2018-02-17 NOTE — Progress Notes (Signed)
Family Medicine Teaching Service Daily Progress Note Intern Pager: [phone]  Patient name: Paul [name] Medical record number: [id number] Date of birth: 27-Aug-1961 Age: 57 y.o. Gender: male  Primary Care Provider: Rogue Bussing, MD Consultants: Cardiology, CCM Code Status: Full  Pt Overview and Major Events to Date:  Paul [name] is a 57 y.o. male presenting with shortness of breath and edema. PMH is significant for the CHF, CAD s/p LAD DES in 5/ 2016, hypertension, PAF, hyperlipidemia, CKD 4, tobacco use and arthritis  Assessment and Plan:  Atrial flutter S/p cardioversion. 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 TEE did not comment on similar findings. Cardiac NM Scan equivical for Amylodosis. SPEP not positive for MM. UPEP showed elevated Free Kappa 380  and Lamda Lt Chain 43, normal ratio.  -consider abdominal fat pad FNA vs. Biopsy for confirmator Amyloid tissue analysis  CHF, worse EF this admission, EF 35-40% -SORA, wt down 2 lbs on 4/11, lower extremity edema improved with TED hose. UOP yesterday 2 L. Worsing Cr 4.3 > 4.2 this on 4/12. Holding lasix insetting of worsening Cr and low BP - nephrology recommendations -Cardiology recommendations -daily wt -strict I/Os -elevate legs, Ted hose for lower extremity edema, elevate scrotom  Shock with multiple organ failure including elevated LFTs Abdominal pain resolved, LFT stable.  -monitor CMP daily -Avoid Tylenol in setting of elevated  LFTs  AKI on CKD 2, stable Creatinine baseline 2.0. Cr 4.3 > 4.2 on 4/12, Patient had elevated beta-2 slightly elevated microglobulin 4.2, UPEP/SPEP pending for multiple myeloma workup. Monitor in the setting diuresis. MM Panel (SPEP) has low protein, indicative of not being MM. UPEP showed elevated Free Kappa 380  and Lamda Lt Chain 43, normal ratio. Nephrology consulted, recommended renal electrolytes, UA, renal ultrasound, holding lasix and did not thing atient could tolerate HD due to low BP. Renal US showed . Urine chemestries, protein, osmolality, Cr, N/K, and UA unremarable. Urine Urea and Calcium pending.    -nephrology recommendations -BMP daily  CAD, stable Continue atorvastatin and metoprolol  Basilar Infiltrates on CXR Afebrile. No leukocytosis. CXR on admission showed diffuse basilar infiltrates consistatnt with edema/CHF. Repeat CXR showed improvement and resolution of infiltrates. Procalcitonin elevated 6 on 4/7. GPC on BCx1 on 4/5, no growth on repeat blood cultures x2 for to date days.   -Zosyn (4/5-4/9), cont augmentin for 7 days (4/9-4/11)  Right arm pain, stable During transfer patient fell out of wheelchair and IV got pulled out.  Patient has ongoing pain in his right arm to this. -pain well controlled  Chronic back pain, stable Patient reports ongoing lower back pain.  He has had multiple hip surgeries and will use oxycodone at home for this pain.  Not being able to get around. Per discussion with PCP, pt was stable not on pain medication in outpatient setting. Will hold oxycodone in the setting of hypotension, low bp, attempt to wean pain medications -oxycodone IR 5 mg q6 prn -PT/OT - No outpatient PT  FEN/GI: HHD PPx: Heparin GTT with bridge to warfarn per pharmacy  Disposition: Inpatient to monitor HR and  BP after cardioversion  Subjective:  Denies CP and abdominal pain. Pt lost bridge for dentures during transport to procedure yesterday. Pt is very upset  about this. Says it is more dificult to eat food and he has bitten his tongue. He does not want a soft diet. Otherwise no other issues.   Objective: Temp:  [97.6 F (36.4 C)-97.9 F (36.6 C)] 97.8 F (36.6 C) (04/12 0806) Pulse Rate:  [51-64] 55 (04/12 0806) Resp:  [15-20] 15 (04/12 0317) BP: (77-111)/(66-80) 111/76 (04/12 0806) SpO2:  [92 %-100 %] 92 % (04/12 0806) Weight:  [192 lb 8 oz (87.3 kg)] 192 lb 8 oz (87.3 kg) (04/12 0317) Physical Exam: Gen: NAD, resting comfortably CV: bradycardic, with no murmurs appreciated, JVD to skull Pulm: NWOB, mild crackles in lower bases GI: soft, nontender, Nondistended. MSK: Periferal IVs in both arms, 2+ lower extremity edema with TED hose Neuro: grossly normal, moves all extremities Psych: Normal affect and thought content  Laboratory: Recent Labs  Lab 02/15/18 0327 02/16/18 0413 02/17/18 0444  WBC 6.5 7.9 7.3  HGB 11.1* 11.9* 10.6*  HCT 35.2* 37.5* 32.8*  PLT 169 214 190   Recent Labs  Lab 02/15/18 0327 02/16/18 0413 02/16/18 1420 02/17/18 0444  NA 140 136 137 136  K 4.4 5.3* 4.7 4.0  CL 98* 96* 98* 98*  CO2 _0 BUN 40* 48* 52* 57*  CREATININE 3.12* 4.38* 4.50* 4.19*  CALCIUM 8.8* 9.0 8.9 8.7*  PROT 6.1* 6.4*  --  6.0*  BILITOT 2.3* 2.8*  --  1.8*  ALKPHOS 65 64  --  66  ALT 790* 622*  --  477*  AST 327* 191*  --  98*  GLUCOSE 109* 108* 120* 122*   Renal Ultrasound 1. Complex cystic area in the upper pole of the right kidney with adjacent anechoic structures, suspect that this is related to possible vascular malformation/AVM as suggested on prior sonogram; the dominant cystic area has increased in size compared to prior. When clinically feasible, suggest follow-up CT angiography. 2. There does however appear to be mild right hydronephrosis. There is no hydronephrosis on the left 3. Increased cortical echogenicity consistent with medical renal disease. Probable scarring in the lower pole of left kidney 4.  Thick walled urinary bladder, question cystitis   Bonnita Hollow, MD 02/17/2018, 9:41 AM PGY-1, [place] Intern pager: [phone], text pages welcome

## 2018-02-17 NOTE — Progress Notes (Addendum)
Progress Note  Patient Name: Paul [name] Date of Encounter: 02/15/2018  Primary Cardiologist: Larae Grooms, Paul   Subjective   57 year old gentleman with a history ofCAD, s/p MI- PCI in 2016. He stopped his medications after one month. Other medical problems include,HTN, CRI-3.  Echo 04/05 w/ possible amyloid Renal function worsened>>Nephrology consult 04/11.  No CP overnight, just does not feel that well.  Inpatient Medications    Scheduled Meds: . mouth rinse  15 mL Mouth Rinse BID  . pantoprazole  40 mg Oral Q1200  . sucralfate  1 g Oral TID WC & HS  . warfarin  7.5 mg Oral ONCE-1800  . Warfarin - Pharmacist Dosing Inpatient   Does not apply q1800   Continuous Infusions: . sodium chloride     PRN Meds:.sodium chloride, traMADol  Vital Signs          Vitals:   02/14/18 2256 02/15/18 0016 02/15/18 0429 02/15/18 0654  BP: 112/71 103/75 110/84 110/84  Pulse: (!) 117 (!) 117 (!) 116 (!) 114  Resp:      Temp:  97.6 F (36.4 C)  97.8 F (36.6 C)  TempSrc:  Oral  Oral  SpO2: (!) 88% 91% 94% 97%  Weight:    209 lb 10.5 oz (95.1 kg)  Height:        Intake/Output Summary (Last 24 hours) at 02/15/2018 0810 Last data filed at 02/15/2018 0655    Gross per 24 hour  Intake 930 ml  Output 5950 ml  Net -5020 ml        Filed Weights   02/13/18 0437 02/14/18 0557 02/15/18 0654  Weight: 204 lb 9.4 oz (92.8 kg) 209 lb 3.5 oz (94.9 kg) 209 lb 10.5 oz (95.1 kg)    Telemetry    NSR freq PVCs - Personally Reviewed  ECG    NSR - Personally Reviewed  Physical Exam   NAT:FTDDUK age male,  NAD  Neck: mild JVD Cardiac: RRR, 2/6 SEM   Respiratory:mostly clear   GU:RKYH, nontender, non-distended  MS: no edema  Neuro:Nonfocal  Psych: Normal affect   Labs    Chemistry LastLabs         Recent Labs  Lab 02/16/18 0413 02/16/18 1420 02/17/18 0444  NA 136 137 136  K 5.3* 4.7 4.0  CL 96* 98* 98*  CO2 $Re'24  24 25  'DtZ$ GLUCOSE 108* 120* 122*  BUN 48* 52* 57*  CREATININE 4.38* 4.50* 4.19*  CALCIUM 9.0 8.9 8.7*  GFRNONAA 13* 13* 14*  GFRAA 16* 15* 16*  ]   Lab 02/13/18 0301 02/14/18 0006 02/15/18 0327  NA 138 137 140  K 4.3 4.9 4.4  CL 104 103 98*  CO2 $Re'24 24 29  'YJM$ GLUCOSE 120* 128* 109*  BUN 41* 39* 40*  CREATININE 3.01* 3.05* 3.12*  CALCIUM 7.9* 8.4* 8.8*  PROT 5.6* 6.5 6.1*  ALBUMIN 2.9* 3.4* 3.1*  AST 313* 330* 327*  ALT 739* 777* 790*  ALKPHOS 59 66 65  BILITOT 3.1* 2.7* 2.3*  GFRNONAA 21* 21* 20*  GFRAA 25* 24* 24*  ANIONGAP $RemoveB'10 10 13       'QqWAsbhj$ Hematology CBC    Component Value Date/Time   WBC 7.3 02/17/2018 0444   RBC 4.25 02/17/2018 0444   HGB 10.6 (L) 02/17/2018 0444   HCT 32.8 (L) 02/17/2018 0444   PLT 190 02/17/2018 0444   MCV 77.2 (L) 02/17/2018 0444   MCH 24.9 (L) 02/17/2018 0444   MCHC 32.3 02/17/2018 0444  RDW 14.7 02/17/2018 0444   LYMPHSABS 1.3 02/10/2018 2350   MONOABS 0.7 02/10/2018 2350   EOSABS 0.0 02/10/2018 2350   BASOSABS 0.0 02/10/2018 2350    LastLabs       Recent Labs  Lab 02/13/18 0301 02/14/18 0006 02/15/18 0327  WBC 6.8 8.1 6.5  RBC 4.38 4.68 4.45  HGB 10.7* 11.4* 11.1*  HCT 34.4* 37.2* 35.2*  MCV 78.5 79.5 79.1  MCH 24.4* 24.4* 24.9*  MCHC 31.1 30.6 31.5  RDW 14.1 14.2 14.7  PLT 157 183 169      Cardiac Enzymes LastLabs        Recent Labs  Lab 02/10/18 1540 02/10/18 2026 02/11/18 0200 02/14/18 0006  TROPONINI 0.22* 0.19* 0.25* 0.10*      LastLabs     Recent Labs  Lab 02/09/18 1412  TROPIPOC 0.06       BNP LastLabs     Recent Labs  Lab 02/09/18 1351  BNP 1,737.7*      RecentLabs       Lab Results  Component Value Date   INR 1.30 02/15/2018   INR 1.21 02/14/2018   INR 1.22 02/13/2018       Radiology     ImagingResults(Last48hours)  Dg Lumbar Spine 2-3 Views  Result Date: 02/14/2018 CLINICAL DATA:  Fall with low back pain EXAM: LUMBAR SPINE - 2-3 VIEW COMPARISON:  CT  09/29/2012, ultrasound 02/10/2018 FINDINGS: Partially visualized hip replacements. Lumbar alignment is within normal limits. The vertebral body heights are maintained. Disc spaces are preserved. Aortic atherosclerosis. Crescent-shaped calcifications in the right upper quadrant, could reflect mild gallbladder wall calcification. IMPRESSION: 1. No acute osseous abnormality. 2. Peripherally oriented calcifications in the right upper quadrant, possible gallbladder wall calcification as may be seen with porcelain gallbladder. Electronically Signed   By: Donavan Foil M.D.   On: 02/14/2018 17:27   Dg Chest Port 1 View  Result Date: 02/13/2018 CLINICAL DATA:  Dyspnea for several days EXAM: PORTABLE CHEST 1 VIEW COMPARISON:  02/11/2018 FINDINGS: Stable cardiomegaly with central vascular congestion and small bilateral pleural effusions. No aortic aneurysm. Minimal aortic atherosclerosis. 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 IMPRESSION: Cardiomegaly with mild CHF. Small bilateral pleural effusions. No significant change. Electronically Signed   By: Ashley Royalty M.D.   On: 02/13/2018 23:00     Cardiac Studies    ECHO: 02/10/2018 - Left ventricle: There is marked hypertrophy of the interventricular septum as well as the papillary muscles with a speckled pattern to the myocardium suggestion possible amyloidosis. The cavity size was severely dilated. There was moderate concentric and severe asymmetric hypertrophy. Systolic function was moderately to severely reduced. The estimated ejection fraction was in the range of 30% to 35%. Moderate diffuse hypokinesis with distinct regional wall motion abnormalities. There is akinesis of the basal-midinferolateral myocardium. The study is not technically sufficient to allow evaluation of LV diastolic function. - Ventricular septum: The contour showed mild diastolic flattening and mild systolic flattening. These  changes are consistent with RV volume and pressure overload. - Aortic valve: Trileaflet; moderately thickened, moderately calcified leaflets. - Mitral valve: There was moderate regurgitation. - Left atrium: The atrium was massively dilated. - Right ventricle: The cavity size was severely dilated. Wall thickness was normal. Systolic function was moderately reduced. - Right atrium: The atrium was massively dilated. - Tricuspid valve: There was moderate regurgitation. - Pulmonic valve: There was trivial regurgitation. - Pulmonary arteries: PA peak pressure: 47 mm Hg (S). - Pericardium, extracardiac: A trivial,  free-flowing pericardial effusion was identified along the right atrial free wall. The fluid had no internal echoes. - Impressions: 4 chamber enlargement with marked LVH and speckled pattern of LV myocardium as well as trivial pericardial effusion suggestive of infiltrative disease such as amyloidosis. Compared to prior echo, LV and RV function have significantly declined. Recommend cardiac MRI.  Impressions:  - 4 chamber enlargement with marked LVH and speckled pattern of LV myocardium as well as trivial pericardial effusion suggestive of infiltrative disease such as amyloidosis. Compared to prior echo, LV and RV function have significantly declined. Recommend cardiac MRI. The right ventricular systolic pressure was increased consistent with moderate pulmonary hypertension.  Patient Profile     57 year old gentleman with a history of CAD, s/p MI- PCI in 2016. He stopped his medications after one month. Other medical problems include, HTN, CRI-3. He presented 02/09/18 with respiratory distress.  He was noted to have hypertension and rapid atrial flutter at that time.  He had a profound lactic acidosis, acute on chronic renal insufficiency, elevated LFTS and cholelithiasis.   Assessment & Plan   1. Atrial flutter with rapid ventricular response.   Status post cardioversion. Maintaining SR  2. Acute systolic congestive heart failure:  We have diuresed him, wt now 192 lbs Lasix held for worsening renal function  3.  Acute on chronic renal insufficiency: Creatinine peak 4.5, 4.19 today  Holding Lasix.  Further recommendations per the internal medicine team/  nephrology.   4.  H/O CAD- s/p MI May 2016 treated with LAD PCI followed by staged CFX PCI June 2016 secondary to CRI.  - no ongoing ischemic sx.  Rosaria Ferries, PA-C 02/17/2018 8:46 AM Beeper [phone]    Mertie Moores, Paul  02/16/2018 10:34 AM    [facility] [place],  [place], [place]  [address] Pager [phone] Phone: [phone]; Fax: [phone]    Attending note: See note from same day     Mertie Moores, Paul  02/20/2018 12:40 PM    [facility] [place],  [place], [place]  [id number] Pager [phone] Phone: [phone]; Fax: [phone]

## 2018-02-17 NOTE — Progress Notes (Signed)
PT Cancellation Note  Patient Details Name: Paul [name] MRN: [id number] DOB: 1961/04/09   Cancelled Treatment:    Reason Eval/Treat Not Completed: Patient at procedure or test/unavailable.  Got pt up to bathroom and then pt had completed his walk solo on return.  Stair deferred. 02/17/2018  Donnella Sham, [place] [phone]  (pager)   Tessie Fass Arshan Jabs 02/17/2018, 5:04 PM

## 2018-02-17 NOTE — Progress Notes (Signed)
Progress Note  Patient Name: Paul [name] Date of Encounter: 02/15/2018  Primary Cardiologist: Larae Grooms, MD   Subjective   57 year old gentleman with a history ofCAD, s/p MI- PCI in 2016. He stopped his medications after one month. Other medical problems include,HTN, CRI-3   Sitting up in bed- he does not appear to SOB at rest I have reviewed the echocardiogram from earlier this week.  He has a bright speckled pattern-consistent with with amyloidosis.  He was successfully cardioverted 2 days ago.   Creatinine remained stable at 4.19.  He thinks he may be breathing a little bit better.  Been bradycardic.  We have held the metoprolol.  Heart rate has remained in the 60s.  Inpatient Medications    Scheduled Meds: . amoxicillin-clavulanate  2 tablet Oral BID  . furosemide  80 mg Intravenous BID  . mouth rinse  15 mL Mouth Rinse BID  . metoprolol tartrate  50 mg Oral Q6H  . pantoprazole  40 mg Oral Q1200  . sodium chloride flush  3 mL Intravenous Q12H  . sucralfate  1 g Oral TID WC & HS  . warfarin  7.5 mg Oral ONCE-1800  . Warfarin - Pharmacist Dosing Inpatient   Does not apply q1800   Continuous Infusions: . sodium chloride    . sodium chloride 20 mL/hr at 02/14/18 2300  . sodium chloride    . heparin 1,450 Units/hr (02/14/18 1403)   PRN Meds: sodium chloride, alum & mag hydroxide-simeth, oxyCODONE, sodium chloride flush   Vital Signs          Vitals:   02/14/18 2256 02/15/18 0016 02/15/18 0429 02/15/18 0654  BP: 112/71 103/75 110/84 110/84  Pulse: (!) 117 (!) 117 (!) 116 (!) 114  Resp:      Temp:  97.6 F (36.4 C)  97.8 F (36.6 C)  TempSrc:  Oral  Oral  SpO2: (!) 88% 91% 94% 97%  Weight:    209 lb 10.5 oz (95.1 kg)  Height:        Intake/Output Summary (Last 24 hours) at 02/15/2018 0810 Last data filed at 02/15/2018 0655    Gross per 24 hour  Intake 930 ml  Output 5950 ml  Net -5020 ml        Filed  Weights   02/13/18 0437 02/14/18 0557 02/15/18 0654  Weight: 204 lb 9.4 oz (92.8 kg) 209 lb 3.5 oz (94.9 kg) 209 lb 10.5 oz (95.1 kg)    Telemetry    Normal sinus rhythm with occasional premature ventricular contractions.- Personally Reviewed  ECG    NSR - Personally Reviewed  Physical Exam: Blood pressure 111/76, pulse (!) 55, temperature 97.8 F (36.6 C), temperature source Oral, resp. rate 15, height 6' (1.829 m), weight 192 lb 8 oz (87.3 kg), SpO2 92 %.  GEN:  Well nourished, well developed in no acute distress HEENT: Normal NECK: No JVD; No carotid bruits LYMPHATICS: No lymphadenopathy CARDIAC: Rate S1-S2.  Soft systolic murmur. RESPIRATORY: Few rales in both bases. ABDOMEN: Soft, non-tender, non-distended MUSCULOSKELETAL: 1-2+ pitting edema.  Improved over the last several days. SKIN: Warm and dry NEUROLOGIC:  Alert and oriented x 3  Labs    Chemistry LastLabs       Recent Labs  Lab 02/13/18 0301 02/14/18 0006 02/15/18 0327  NA 138 137 140  K 4.3 4.9 4.4  CL 104 103 98*  CO2 $Re'24 24 29  'TTm$ GLUCOSE 120* 128* 109*  BUN 41* 39* 40*  CREATININE 3.01* 3.05*  3.12*  CALCIUM 7.9* 8.4* 8.8*  PROT 5.6* 6.5 6.1*  ALBUMIN 2.9* 3.4* 3.1*  AST 313* 330* 327*  ALT 739* 777* 790*  ALKPHOS 59 66 65  BILITOT 3.1* 2.7* 2.3*  GFRNONAA 21* 21* 20*  GFRAA 25* 24* 24*  ANIONGAP $RemoveB'10 10 13       'TpNxxevk$ Hematology LastLabs       Recent Labs  Lab 02/13/18 0301 02/14/18 0006 02/15/18 0327  WBC 6.8 8.1 6.5  RBC 4.38 4.68 4.45  HGB 10.7* 11.4* 11.1*  HCT 34.4* 37.2* 35.2*  MCV 78.5 79.5 79.1  MCH 24.4* 24.4* 24.9*  MCHC 31.1 30.6 31.5  RDW 14.1 14.2 14.7  PLT 157 183 169      Cardiac Enzymes LastLabs        Recent Labs  Lab 02/10/18 1540 02/10/18 2026 02/11/18 0200 02/14/18 0006  TROPONINI 0.22* 0.19* 0.25* 0.10*      LastLabs     Recent Labs  Lab 02/09/18 1412  TROPIPOC 0.06       BNP LastLabs     Recent Labs  Lab 02/09/18 1351    BNP 1,737.7*      RecentLabs       Lab Results  Component Value Date   INR 1.30 02/15/2018   INR 1.21 02/14/2018   INR 1.22 02/13/2018       Radiology     ImagingResults(Last48hours)  Dg Lumbar Spine 2-3 Views  Result Date: 02/14/2018 CLINICAL DATA:  Fall with low back pain EXAM: LUMBAR SPINE - 2-3 VIEW COMPARISON:  CT 09/29/2012, ultrasound 02/10/2018 FINDINGS: Partially visualized hip replacements. Lumbar alignment is within normal limits. The vertebral body heights are maintained. Disc spaces are preserved. Aortic atherosclerosis. Crescent-shaped calcifications in the right upper quadrant, could reflect mild gallbladder wall calcification. IMPRESSION: 1. No acute osseous abnormality. 2. Peripherally oriented calcifications in the right upper quadrant, possible gallbladder wall calcification as may be seen with porcelain gallbladder. Electronically Signed   By: Donavan Foil M.D.   On: 02/14/2018 17:27   Dg Chest Port 1 View  Result Date: 02/13/2018 CLINICAL DATA:  Dyspnea for several days EXAM: PORTABLE CHEST 1 VIEW COMPARISON:  02/11/2018 FINDINGS: Stable cardiomegaly with central vascular congestion and small bilateral pleural effusions. No aortic aneurysm. Minimal aortic atherosclerosis. 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 IMPRESSION: Cardiomegaly with mild CHF. Small bilateral pleural effusions. No significant change. Electronically Signed   By: Ashley Royalty M.D.   On: 02/13/2018 23:00     Cardiac Studies    ECHO: 02/10/2018 - Left ventricle: There is marked hypertrophy of the interventricular septum as well as the papillary muscles with a speckled pattern to the myocardium suggestion possible amyloidosis. The cavity size was severely dilated. There was moderate concentric and severe asymmetric hypertrophy. Systolic function was moderately to severely reduced. The estimated ejection fraction was in the range of 30%  to 35%. Moderate diffuse hypokinesis with distinct regional wall motion abnormalities. There is akinesis of the basal-midinferolateral myocardium. The study is not technically sufficient to allow evaluation of LV diastolic function. - Ventricular septum: The contour showed mild diastolic flattening and mild systolic flattening. These changes are consistent with RV volume and pressure overload. - Aortic valve: Trileaflet; moderately thickened, moderately calcified leaflets. - Mitral valve: There was moderate regurgitation. - Left atrium: The atrium was massively dilated. - Right ventricle: The cavity size was severely dilated. Wall thickness was normal. Systolic function was moderately reduced. - Right atrium: The atrium was massively dilated. -  Tricuspid valve: There was moderate regurgitation. - Pulmonic valve: There was trivial regurgitation. - Pulmonary arteries: PA peak pressure: 47 mm Hg (S). - Pericardium, extracardiac: A trivial, free-flowing pericardial effusion was identified along the right atrial free wall. The fluid had no internal echoes. - Impressions: 4 chamber enlargement with marked LVH and speckled pattern of LV myocardium as well as trivial pericardial effusion suggestive of infiltrative disease such as amyloidosis. Compared to prior echo, LV and RV function have significantly declined. Recommend cardiac MRI.  Impressions:  - 4 chamber enlargement with marked LVH and speckled pattern of LV myocardium as well as trivial pericardial effusion suggestive of infiltrative disease such as amyloidosis. Compared to prior echo, LV and RV function have significantly declined. Recommend cardiac MRI. The right ventricular systolic pressure was increased consistent with moderate pulmonary hypertension.  Patient Profile     57 year old gentleman with a history of CAD, s/p MI- PCI in 2016. He stopped his medications after one month.  Other medical problems include, HTN, CRI-3. He presented 02/09/18 with respiratory distress.  He was noted to have hypertension and rapid atrial flutter at that time.  He had a profound lactic acidosis, acute on chronic renal insufficiency, elevated LFTS and cholelithiasis.   Assessment & Plan   1. Atrial flutter with rapid ventricular response.  Successfully cardioverted.  His heart rate has been slow.  Metoprolol has been held.  2. Acute systolic congestive heart failure:  .  Is net -2 L through this admission.  Hopefully his heart failure will improve now that we have cardioverted him.  3.  Acute on chronic renal insufficiency:  Retina may remains above 4.  Further plans per internal medicine and nephrology.   5.  H/O CAD- s/p MI May 2016 treated with LAD PCI followed by staged CFX PCI June 2016 secondary to CRI.     Mertie Moores, MD  02/16/2018 10:34 AM    [facility] [place],  [place], [place]  [address] Pager [phone] Phone: [phone]; Fax: [phone]

## 2018-02-17 NOTE — Progress Notes (Signed)
Patient ID: Paul [name], male   DOB: 1960/11/22, 57 y.o.   MRN: [id number] [facility] Progress Note   Assessment/ Plan:   1. Acute Kidney Injury: This appears to be possibly from ATN/hemodynamically mediated injury in the face of hypotension/bradycardia and CHF exacerbation with renal hypoperfusion injury.  Overnight, appears to be doing better with regards to renal function and increasing urine output.  No clear evidence of plasma cell dyscrasia at this time-ongoing evaluation for amyloidosis because of suspicious findings on echocardiogram. 2.  Atrial flutter with rapid ventricular response: Status post successful cardioversion and postprocedure bradycardia, metoprolol held. 3.  Acute exacerbation of systolic heart failure: Diuretics held after development of acute kidney injury/hypotension-fair urine output noted with slow clinical improvement/volume unloading. 4.  Anemia: Mild, will check iron studies  Subjective:   Reports to be feeling fair and states that shortness of breath is unchanged overnight   Objective:   BP 111/76 (BP Location: Right Arm)   Pulse (!) 55   Temp 97.8 F (36.6 C) (Oral)   Resp 15   Ht 6' (1.829 m)   Wt 87.3 kg (192 lb 8 oz)   SpO2 92%   BMI 26.11 kg/m   Intake/Output Summary (Last 24 hours) at 02/17/2018 1116 Last data filed at 02/17/2018 1740 Gross per 24 hour  Intake 6031.25 ml  Output 1125 ml  Net 4906.25 ml   Weight change: -6.804 kg (-15 lb)  Physical Exam: Gen: Resting comfortably in bed, watching television CVS: Pulse regular bradycardia, S1 and S2 normal Resp: Fine rales both bases, no wheeze/rhonchi Abd: Soft, flat, nontender Ext: 1-2+ lower extremity edema  Imaging: Nm Tumor Localization W Spect  Result Date: 02/16/2018 CLINICAL DATA:  HEART FAILURE. CONCERN FOR CARDIAC AMYLOIDOSIS. EXAM: NUCLEAR MEDICINE TUMOR LOCALIZATION. PYP CARDIAC AMYLOIDOSIS SCAN WITH SPECT TECHNIQUE: Following intravenous administration of  radiopharmaceutical, anterior planar images of the chest were obtained. Regions of interest were placed on the heart and contralateral chest wall for quantitative assessment. Additional SPECT imaging of the chest was obtained. RADIOPHARMACEUTICALS:  21.2 mCi TECHNETIUM 99 PYROPHOSPHATE FINDINGS: Planar Visual assessment: Anterior planar imaging demonstrates radiotracer uptake within the heart less than than uptake within the adjacent ribs (Grade 0). Quantitative assessment : Quantitative assessment of the cardiac uptake compared to the contralateral chest wall is equal to (H/CL = 1.17). SPECT assessment: SPECT imaging of the chest demonstrates uptake less than rib uptake radiotracer accumulation within the LEFT ventricle. IMPRESSION: Visual and quantitative assessment (grade 0 to 1, H/CLL equal 1.17) are equivocal for transthyretin amyloidosis. Electronically Signed   By: Kerby Moors M.D.   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 It is probably stable allowing for limitations of inter modality comparison. CTA follow-up, if/when clinically feasible would better evaluate for interval change. Electronically Signed   By: Donavan Foil M.D.   On: 02/16/2018 17:05   Result Date: 02/16/2018 CLINICAL DATA:  Elevated creatinine EXAM: RENAL / URINARY TRACT ULTRASOUND COMPLETE COMPARISON:  CT abdomen pelvis 09/29/2012, ultrasound 09/28/2012 FINDINGS: Right Kidney: Length: 13.4 cm. Increased cortical echogenicity. Mild hydronephrosis of the right pelvis. Complex cystic mass upper pole right kidney with dominant cystic area measuring up to 5.9 cm, increased compared to prior sonogram. Left Kidney: Length:  11.4 cm. Increased  cortical echogenicity. No hydronephrosis. Probable scarring lower pole left kidney. Bladder: Slightly thick-walled in appearance. Incidental note made of right pleural effusion. IMPRESSION: 1. Complex cystic area in the upper pole of the right kidney with adjacent anechoic structures, suspect that this is related to possible vascular malformation/AVM as suggested on prior sonogram; the dominant cystic area has increased in size compared to prior. When clinically feasible, suggest follow-up CT angiography. 2. There does however appear to be mild right hydronephrosis. There is no hydronephrosis on the left 3. Increased cortical echogenicity consistent with medical renal disease. Probable scarring in the lower pole of left kidney 4. Thick walled urinary bladder, question cystitis Electronically Signed: By: Donavan Foil M.D. On: 02/16/2018 16:58    Labs: BMET Recent Labs  Lab 02/11/18 0200 02/11/18 2209 02/13/18 0301 02/14/18 0006 02/15/18 0327 02/16/18 0413 02/16/18 1420 02/17/18 0444  NA 144  144 141 138 137 140 136 137 136  K 5.6*  5.5* 4.9 4.3 4.9 4.4 5.3* 4.7 4.0  CL 108  108 106 104 103 98* 96* 98* 98*  CO2 19*  19* $Re'22 24 24 29 24 24 25  'mDF$ GLUCOSE 102*  120* 120* 120* 128* 109* 108* 120* 122*  BUN 44*  43* 51* 41* 39* 40* 48* 52* 57*  CREATININE 3.44*  3.39* 3.63* 3.01* 3.05* 3.12* 4.38* 4.50* 4.19*  CALCIUM 8.3*  8.2* 8.0* 7.9* 8.4* 8.8* 9.0 8.9 8.7*  PHOS 6.0*  6.0*  --   --   --   --   --   --   --    CBC Recent Labs  Lab 02/10/18 1540 02/10/18 2350  02/14/18 0006 02/15/18 0327 02/16/18 0413 02/17/18 0444  WBC 9.2 10.3   < > 8.1 6.5 7.9 7.3  NEUTROABS 6.7 8.3*  --   --   --   --   --   HGB 12.1* 11.9*   < > 11.4* 11.1* 11.9* 10.6*  HCT 39.1 38.6*   < > 37.2* 35.2* 37.5* 32.8*  MCV 78.7 80.1   < > 79.5 79.1 78.3 77.2*  PLT 189 154   < > 183 169 214 190   < > = values in this interval not displayed.    Medications:    . mouth rinse  15 mL Mouth Rinse BID  .  pantoprazole  40 mg Oral Q1200  . sucralfate  1 g Oral TID WC & HS  . warfarin  7.5 mg Oral ONCE-1800  . Warfarin - Pharmacist Dosing Inpatient   Does not apply [id number]   Elmarie Shiley, MD 02/17/2018, 11:16 AM

## 2018-02-17 NOTE — Progress Notes (Signed)
ANTICOAGULATION CONSULT NOTE  Pharmacy Consult:  Heparin / Coumadin Indication: atrial fibrillation  Allergies  Allergen Reactions  . Ibuprofen Other (See Comments)    Kidney damage  . Nsaids     REACTION: nsaid induced nephropathy    Patient Measurements: Height: 6' (182.9 cm) Weight: 192 lb 8 oz (87.3 kg) IBW/kg (Calculated) : 77.6  Vital Signs: Temp: 97.6 F (36.4 C) (04/12 0317) Temp Source: Oral (04/12 0317) BP: 110/80 (04/12 0317) Pulse Rate: 64 (04/12 0317)  Labs: Recent Labs    02/15/18 0327 02/16/18 0413 02/16/18 1420 02/17/18 0444  HGB 11.1* 11.9*  --  10.6*  HCT 35.2* 37.5*  --  32.8*  PLT 169 214  --  190  LABPROT 16.1* 17.0*  --  22.8*  INR 1.30 1.40  --  2.04  HEPARINUNFRC 0.46 0.35  --  0.77*  CREATININE 3.12* 4.38* 4.50* 4.19*    Estimated Creatinine Clearance: 20.8 mL/min (A) (by C-G formula based on SCr of 4.19 mg/dL (H)).   Assessment: 47 YOM went to PCP for SOB and CP with LE edema and was sent to the ED for acute CHF +/- atrial flutter.  Patient has a history of PAF however was not on any anticoagulation.  He was started on IV heparin bridge to Coumadin.  Heparin level slightly supra-therapeutic today and INR increased to therapeutic level.  LFTs improving, eating well, no bleeding reported.   Goal of Therapy:  INR 2-3 Heparin level 0.3-0.7 units/ml Monitor platelets by anticoagulation protocol: Yes    Plan:  Coumadin 7.$RemoveBef'5mg'VulTzdAzRi$  PO today to ensure INR doesn't drop with stopping heparin and s/p DCCV D/C heparin given therapeutic INR Daily PT / INR   Vaneta Hammontree D. Mina Marble, PharmD, BCPS Pager:  [phone] 02/17/2018, 7:29 AM

## 2018-02-18 ENCOUNTER — Ambulatory Visit (HOSPITAL_COMMUNITY): Payer: Medicare HMO

## 2018-02-18 DIAGNOSIS — F1491 Cocaine use, unspecified, in remission: Secondary | ICD-10-CM

## 2018-02-18 DIAGNOSIS — Z87898 Personal history of other specified conditions: Secondary | ICD-10-CM

## 2018-02-18 DIAGNOSIS — I5023 Acute on chronic systolic (congestive) heart failure: Secondary | ICD-10-CM

## 2018-02-18 DIAGNOSIS — R748 Abnormal levels of other serum enzymes: Secondary | ICD-10-CM

## 2018-02-18 HISTORY — DX: Cocaine use, unspecified, in remission: F14.91

## 2018-02-18 HISTORY — DX: Personal history of other specified conditions: Z87.898

## 2018-02-18 LAB — COMPREHENSIVE METABOLIC PANEL
ALT: 358 U/L — ABNORMAL HIGH (ref 17–63)
ANION GAP: 12 (ref 5–15)
AST: 61 U/L — ABNORMAL HIGH (ref 15–41)
Albumin: 3.2 g/dL — ABNORMAL LOW (ref 3.5–5.0)
Alkaline Phosphatase: 65 U/L (ref 38–126)
BILIRUBIN TOTAL: 1.9 mg/dL — AB (ref 0.3–1.2)
BUN: 50 mg/dL — ABNORMAL HIGH (ref 6–20)
CHLORIDE: 102 mmol/L (ref 101–111)
CO2: 24 mmol/L (ref 22–32)
Calcium: 8.8 mg/dL — ABNORMAL LOW (ref 8.9–10.3)
Creatinine, Ser: 3.27 mg/dL — ABNORMAL HIGH (ref 0.61–1.24)
GFR, EST AFRICAN AMERICAN: 22 mL/min — AB (ref >60)
GFR, EST NON AFRICAN AMERICAN: 19 mL/min — AB (ref >60)
Glucose, Bld: 103 mg/dL — ABNORMAL HIGH (ref 65–99)
POTASSIUM: 4.1 mmol/L (ref 3.5–5.1)
Sodium: 138 mmol/L (ref 135–145)
TOTAL PROTEIN: 6.2 g/dL — AB (ref 6.5–8.1)

## 2018-02-18 LAB — CBC
HCT: 33.6 % — ABNORMAL LOW (ref 39.0–52.0)
HEMOGLOBIN: 10.6 g/dL — AB (ref 13.0–17.0)
MCH: 24.3 pg — AB (ref 26.0–34.0)
MCHC: 31.5 g/dL (ref 30.0–36.0)
MCV: 77.1 fL — ABNORMAL LOW (ref 78.0–100.0)
PLATELETS: 178 10*3/uL (ref 150–400)
RBC: 4.36 MIL/uL (ref 4.22–5.81)
RDW: 14.2 % (ref 11.5–15.5)
WBC: 7.9 10*3/uL (ref 4.0–10.5)

## 2018-02-18 LAB — PROTIME-INR
INR: 2.02
PROTHROMBIN TIME: 22.7 s — AB (ref 11.4–15.2)

## 2018-02-18 MED ORDER — FERROUS SULFATE 325 (65 FE) MG PO TABS
325.0000 mg | ORAL_TABLET | Freq: Three times a day (TID) | ORAL | Status: DC
  Administered 2018-02-18 – 2018-02-19 (×4): 325 mg via ORAL
  Filled 2018-02-18 (×4): qty 1

## 2018-02-18 MED ORDER — WARFARIN SODIUM 7.5 MG PO TABS
7.5000 mg | ORAL_TABLET | Freq: Once | ORAL | Status: AC
  Administered 2018-02-18: 7.5 mg via ORAL
  Filled 2018-02-18: qty 1

## 2018-02-18 MED ORDER — TRAMADOL HCL 50 MG PO TABS
25.0000 mg | ORAL_TABLET | Freq: Two times a day (BID) | ORAL | Status: DC | PRN
  Administered 2018-02-19: 25 mg via ORAL
  Filled 2018-02-18: qty 1

## 2018-02-18 MED ORDER — CARVEDILOL 3.125 MG PO TABS
3.1250 mg | ORAL_TABLET | Freq: Two times a day (BID) | ORAL | Status: DC
  Administered 2018-02-18 – 2018-02-19 (×3): 3.125 mg via ORAL
  Filled 2018-02-18 (×3): qty 1

## 2018-02-18 NOTE — Progress Notes (Signed)
ANTICOAGULATION CONSULT NOTE  Pharmacy Consult:  Warfarin  Indication: atrial fibrillation  Allergies  Allergen Reactions  . Ibuprofen Other (See Comments)    Kidney damage  . Nsaids     REACTION: nsaid induced nephropathy    Patient Measurements: Height: 6' (182.9 cm) Weight: 191 lb 4.8 oz (86.8 kg) IBW/kg (Calculated) : 77.6  Vital Signs: Temp: 97.9 F (36.6 C) (04/13 0728) Temp Source: Oral (04/13 0728) BP: 123/101 (04/13 0728) Pulse Rate: 81 (04/13 0728)  Labs: Recent Labs    02/16/18 0413 02/16/18 1420 02/17/18 0444 02/18/18 0349  HGB 11.9*  --  10.6* 10.6*  HCT 37.5*  --  32.8* 33.6*  PLT 214  --  190 178  LABPROT 17.0*  --  22.8* 22.7*  INR 1.40  --  2.04 2.02  HEPARINUNFRC 0.35  --  0.77*  --   CREATININE 4.38* 4.50* 4.19* 3.27*    Estimated Creatinine Clearance: 26.7 mL/min (A) (by C-G formula based on SCr of 3.27 mg/dL (H)).   Assessment: 25 YOM went to PCP for SOB and CP with LE edema and was sent to the ED for acute CHF +/- atrial flutter.  Patient has a history of PAF however was not on any anticoagulation.  He was started on IV heparin bridge to warfarin, heparin now discontinued with INR >2.    LFTs improving, eating well, no bleeding reported per note. INR today above target at 2.02. Slight drop in hemoglobin to 10.6 and platelets to 178.    Goal of Therapy:  INR 2-3 Heparin level 0.3-0.7 units/ml Monitor platelets by anticoagulation protocol: Yes    Plan:  Warfarin 7.$RemoveBef'5mg'MGKRpGOkTv$  PO x 1 again this evening to keep INR >2 with recent DCCV  Daily PT / INR Monitor for S/Sx of bleeding   Jalene Mullet, Pharm.D. PGY1 Pharmacy Resident 02/18/2018 8:33 AM Main Pharmacy: [phone]

## 2018-02-18 NOTE — Progress Notes (Addendum)
Family Medicine Teaching Service Daily Progress Note Intern Pager: [phone]  Patient name: Paul [name] Medical record number: [id number] Date of birth: 04/04/1961 Age: 57 y.o. Gender: male  Primary Care Provider: Rogue Bussing, MD Consultants: Cardiology, CCM Code Status: Full  Pt Overview and Major Events to Date:  EDIBERTO [name] is a 57 y.o. male presenting with shortness of breath and edema. PMH is significant for the CHF, CAD s/p LAD DES in 5/ 2016, hypertension, PAF, hyperlipidemia, CKD 4, tobacco use and arthritis  Assessment and Plan:  Atrial flutter, improved Rate controlled Sinus Bradycardic 60-80s. Did have run of NSVT with wide complex PVCs that have now resolved episode. BP 120/70s.  -repeat EKG in AM -started on low does coreg, will monitor HR -appreciate cardiology recs -On warfarin due to elevated creatinine, possibly switch to DOAc as renal fxn improves < 2.0  History of Cocaine use Patient lasted used last November. Neg UDS on admission. Discussed the risk of beta blocker and cocaine use. Patient acknowledged this and said he would not use while taking his coreg -Cont coreg for rate control  Possible Amyloidosis TTE showed 4 chamber enlargement and speckled pattern of LV myocardium suggestive of infiltrative disease such as amyloidosis. TEE did not comment on similar findings. Cardiac NM Scan equivical for Amylodosis. SPEP not positive for MM. UPEP showed elevated Free Kappa 380  and Lamda Lt Chain 43, normal ratio. Diagnosis not consistent with TTR Amyloid. Could further evaluate with Cardiac MRI, but renal function would not tolerate it at this time. Alternative testing could include abdominal fat pad FNA vs. Biopsy, but pt needs to be on anticoagulation 30 days s/p cardioversion.  -recommend further outpatient workup per PCP  CHF, stable EF 35-40% -SORA, wt 5 lbs down from admission, lower extremity edema improved with TED hose. UOP 2 L. Cr  improved.  - nephrology recommendations -Cardiology recommendations -daily wt -strict I/Os -elevate legs, Ted hose for lower extremity edema, elevate scrotom  Shock with multiple organ failure including elevated LFTs Abdominal pain resolved, LFT improved -monitor CMP daily -recommend following LFTs in outpatient setting after discharge to ensure the are continuing to downtrend -Avoid Tylenol in setting of elevated LFTs  AKI on CKD 2, stable Creatinine baseline 2.0. Cr 4.2 > 3.3. UOP 2L. U -Enlarging complex cyst seen on Renal US concerning for possible vascular malformation/AVM, radiology recommended CT angiography to further evaluate when clinically feasible. Not pursued during this hospital admission insetting of elevated Cr.  -nephrology recommendations -BMP daily  CAD, stable Continue atorvastatin and metoprolol  Basilar Infiltrates on CXR Afebrile. No leukocytosis. CXR on admission showed diffuse basilar infiltrates consistatnt with edema/CHF. Repeat CXR showed improvement and resolution of infiltrates. Procalcitonin elevated 6 on 4/7. GPC on BCx1 on 4/5, no growth on repeat blood cultures. Antibiotic course Zosyn (4/5-4/9) augmentin for 7 days (4/9-4/11).  - monitor resp status  Right arm pain, stable During transfer patient fell out of wheelchair and IV got pulled out.  Patient has ongoing pain in his right arm to this. -pain well controlled on tramadol  Chronic back pain, stable Patient reports ongoing lower back pain.  He has had multiple hip surgeries and will use oxycodone at home for this pain.  Not being able to get around. Per discussion with PCP, pt was stable not on pain medication prior to admission.  -tramadol $RemoveBe'25mg'MBXeFNUHA$  q6r, only used once yesterday, will decrease frequency to q12h -PT/OT - No outpatient PT  Microcytic Anemia Baseline Hg is 11-12.  Pt currently 10.6 today. Iron studies showed low iron, normal ferritin, normal TIBC, low Sat ratio. Anemia of Chronic  Disease vs. Iron deficiency anemia.  -Will trial oral iron supplementation.  - cont further work up as outpatient. May need colonoscopy to rule out colon cancer  -CBC  FEN/GI: HHD PPx: warfarn per pharmacy  Disposition: Inpatient to monitor HR and BP, likely home tomorrow  Subjective:  No CP or abdominal pain. Pt breathing comfortably. Still upset about loosing his bridge after cardioversion.    Objective: Temp:  [97.3 F (36.3 C)-98.5 F (36.9 C)] 97.3 F (36.3 C) (04/13 1100) Pulse Rate:  [62-81] 80 (04/13 1100) Resp:  [15-16] 16 (04/13 0728) BP: (114-128)/(68-101) 114/71 (04/13 1100) SpO2:  [97 %] 97 % (04/13 0728) Weight:  [191 lb 4.8 oz (86.8 kg)] 191 lb 4.8 oz (86.8 kg) (04/13 0456) Physical Exam: Gen: NAD, laying comfortably in bed CV: RRR, NMRG Pulm: NWOB, CTAB GI: soft, nontender, Nondistended. MSK: 2+ lower extremity edema, legs elevated Neuro: grossly normal, moves all extremities Psych: Normal affect and thought content  Laboratory: Recent Labs  Lab 02/16/18 0413 02/17/18 0444 02/18/18 0349  WBC 7.9 7.3 7.9  HGB 11.9* 10.6* 10.6*  HCT 37.5* 32.8* 33.6*  PLT 214 190 178   Recent Labs  Lab 02/16/18 0413 02/16/18 1420 02/17/18 0444 02/18/18 0349  NA 136 137 136 138  K 5.3* 4.7 4.0 4.1  CL 96* 98* 98* 102  CO2 $Re'24 24 25 24  'dyV$ BUN 48* 52* 57* 50*  CREATININE 4.38* 4.50* 4.19* 3.27*  CALCIUM 9.0 8.9 8.7* 8.8*  PROT 6.4*  --  6.0* 6.2*  BILITOT 2.8*  --  1.8* 1.9*  ALKPHOS 64  --  66 65  ALT 622*  --  477* 358*  AST 191*  --  98* 61*  GLUCOSE 108* 120* 122* 103*   Renal Ultrasound 1. Complex cystic area in the upper pole of the right kidney with adjacent anechoic structures, suspect that this is related to possible vascular malformation/AVM as suggested on prior sonogram; the dominant cystic area has increased in size compared to prior. When clinically feasible, suggest follow-up CT angiography. 2. There does however appear to be mild right  hydronephrosis. There is no hydronephrosis on the left 3. Increased cortical echogenicity consistent with medical renal disease. Probable scarring in the lower pole of left kidney 4. Thick walled urinary bladder, question cystitis  Bonnita Hollow, MD 02/18/2018, 1:21 PM PGY-1, [place] Intern pager: [phone], text pages welcome

## 2018-02-18 NOTE — Progress Notes (Signed)
Patient ID: Paul [name], male   DOB: 26-Aug-1961, 57 y.o.   MRN: [id number] [facility] Progress Note   Assessment/ Plan:   1. Acute Kidney Injury: This appears to be possibly from ATN/hemodynamically mediated injury in the face of hypotension/bradycardia and CHF exacerbation with renal hypoperfusion injury.  With improving urine output/renal function noted on labs overnight.  No clear evidence of plasma cell dyscrasia at this time-ongoing evaluation for amyloidosis because of suspicious findings on echocardiogram. 2.  Atrial flutter with rapid ventricular response: Status post successful cardioversion and postprocedure bradycardia, metoprolol held. 3.  Acute exacerbation of systolic heart failure: Diuretics held after development of acute kidney injury/hypotension-he continues to maintain excellent urine output with net negative fluid balance. 4.  Anemia: Secondary deficiency, will give intravenous iron  Subjective:   Reports to be feeling fair and states that he has been urinating well overnight   Objective:   BP (!) 123/101 (BP Location: Right Arm)   Pulse 81   Temp 97.9 F (36.6 C) (Oral)   Resp 16   Ht 6' (1.829 m)   Wt 86.8 kg (191 lb 4.8 oz)   SpO2 97%   BMI 25.94 kg/m   Intake/Output Summary (Last 24 hours) at 02/18/2018 0943 Last data filed at 02/18/2018 0900 Gross per 24 hour  Intake 1740 ml  Output 2200 ml  Net -460 ml   Weight change: -0.544 kg (-1 lb 3.2 oz)  Physical Exam: Gen: Resting comfortably in bed, awaiting fresh linen/clothes CVS: Pulse regular rhythm, normal rate, S1 and S2 normal Resp: Diminished breath sounds over bases without rales, no wheeze/rhonchi Abd: Soft, flat, nontender Ext: 1+ pitting lower extremity edema  Imaging: Nm Tumor Localization W Spect  Result Date: 02/16/2018 CLINICAL DATA:  HEART FAILURE. CONCERN FOR CARDIAC AMYLOIDOSIS. EXAM: NUCLEAR MEDICINE TUMOR LOCALIZATION. PYP CARDIAC AMYLOIDOSIS SCAN WITH SPECT  TECHNIQUE: Following intravenous administration of radiopharmaceutical, anterior planar images of the chest were obtained. Regions of interest were placed on the heart and contralateral chest wall for quantitative assessment. Additional SPECT imaging of the chest was obtained. RADIOPHARMACEUTICALS:  21.2 mCi TECHNETIUM 99 PYROPHOSPHATE FINDINGS: Planar Visual assessment: Anterior planar imaging demonstrates radiotracer uptake within the heart less than than uptake within the adjacent ribs (Grade 0). Quantitative assessment : Quantitative assessment of the cardiac uptake compared to the contralateral chest wall is equal to (H/CL = 1.17). SPECT assessment: SPECT imaging of the chest demonstrates uptake less than rib uptake radiotracer accumulation within the LEFT ventricle. IMPRESSION: Visual and quantitative assessment (grade 0 to 1, H/CLL equal 1.17) are equivocal for transthyretin amyloidosis. Electronically Signed   By: Kerby Moors M.D.   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 It is probably stable allowing for limitations of inter modality comparison. CTA follow-up, if/when clinically feasible would better evaluate for interval change. Electronically Signed   By: Donavan Foil M.D.   On: 02/16/2018 17:05   Result Date: 02/16/2018 CLINICAL DATA:  Elevated creatinine EXAM: RENAL / URINARY TRACT ULTRASOUND COMPLETE COMPARISON:  CT abdomen pelvis 09/29/2012, ultrasound 09/28/2012 FINDINGS: Right Kidney: Length: 13.4 cm. Increased cortical echogenicity. Mild hydronephrosis of the right pelvis. Complex cystic mass upper pole right kidney with dominant cystic area measuring up to 5.9 cm, increased  compared to  prior sonogram. Left Kidney: Length: 11.4 cm. Increased cortical echogenicity. No hydronephrosis. Probable scarring lower pole left kidney. Bladder: Slightly thick-walled in appearance. Incidental note made of right pleural effusion. IMPRESSION: 1. Complex cystic area in the upper pole of the right kidney with adjacent anechoic structures, suspect that this is related to possible vascular malformation/AVM as suggested on prior sonogram; the dominant cystic area has increased in size compared to prior. When clinically feasible, suggest follow-up CT angiography. 2. There does however appear to be mild right hydronephrosis. There is no hydronephrosis on the left 3. Increased cortical echogenicity consistent with medical renal disease. Probable scarring in the lower pole of left kidney 4. Thick walled urinary bladder, question cystitis Electronically Signed: By: Donavan Foil M.D. On: 02/16/2018 16:58    Labs: BMET Recent Labs  Lab 02/13/18 0301 02/14/18 0006 02/15/18 0327 02/16/18 0413 02/16/18 1420 02/17/18 0444 02/18/18 0349  NA 138 137 140 136 137 136 138  K 4.3 4.9 4.4 5.3* 4.7 4.0 4.1  CL 104 103 98* 96* 98* 98* 102  CO2 $Re'24 24 29 24 24 25 24  'tvM$ GLUCOSE 120* 128* 109* 108* 120* 122* 103*  BUN 41* 39* 40* 48* 52* 57* 50*  CREATININE 3.01* 3.05* 3.12* 4.38* 4.50* 4.19* 3.27*  CALCIUM 7.9* 8.4* 8.8* 9.0 8.9 8.7* 8.8*   CBC Recent Labs  Lab 02/15/18 0327 02/16/18 0413 02/17/18 0444 02/18/18 0349  WBC 6.5 7.9 7.3 7.9  HGB 11.1* 11.9* 10.6* 10.6*  HCT 35.2* 37.5* 32.8* 33.6*  MCV 79.1 78.3 77.2* 77.1*  PLT 169 214 190 178    Medications:    . carvedilol  3.125 mg Oral BID WC  . ferrous sulfate  325 mg Oral TID WC  . mouth rinse  15 mL Mouth Rinse BID  . pantoprazole  40 mg Oral Q1200  . sucralfate  1 g Oral TID WC & HS  . warfarin  7.5 mg Oral ONCE-1800  . Warfarin - Pharmacist Dosing Inpatient   Does not apply [id number]   Elmarie Shiley, MD 02/18/2018, 9:43 AM

## 2018-02-18 NOTE — Progress Notes (Addendum)
Progress Note  Patient Name: Paul [name] Date of Encounter: 02/18/2018  Primary Cardiologist: Larae Grooms, MD   Subjective   Denies any CP or SOB. No orthopnea and PND. Ambulated yesterday, feeling well  Inpatient Medications    Scheduled Meds: . carvedilol  3.125 mg Oral BID WC  . ferrous sulfate  325 mg Oral TID WC  . mouth rinse  15 mL Mouth Rinse BID  . pantoprazole  40 mg Oral Q1200  . sucralfate  1 g Oral TID WC & HS  . warfarin  7.5 mg Oral ONCE-1800  . Warfarin - Pharmacist Dosing Inpatient   Does not apply q1800   Continuous Infusions: . sodium chloride     PRN Meds: sodium chloride, traMADol   Vital Signs    Vitals:   02/17/18 2027 02/18/18 0006 02/18/18 0456 02/18/18 0728  BP: 128/73 120/81 119/68 (!) 123/101  Pulse: 74 62 69 81  Resp: $Remo'15 15 16 16  'feWrq$ Temp: 98.5 F (36.9 C) 97.6 F (36.4 C) 97.9 F (36.6 C) 97.9 F (36.6 C)  TempSrc: Oral Oral Oral Oral  SpO2:    97%  Weight:   191 lb 4.8 oz (86.8 kg)   Height:        Intake/Output Summary (Last 24 hours) at 02/18/2018 1125 Last data filed at 02/18/2018 0900 Gross per 24 hour  Intake 1740 ml  Output 2200 ml  Net -460 ml   Filed Weights   02/16/18 0521 02/17/18 0317 02/18/18 0456  Weight: 207 lb 8 oz (94.1 kg) 192 lb 8 oz (87.3 kg) 191 lb 4.8 oz (86.8 kg)    Telemetry    Sinus rhythm with occasional PVCs, 1 episode of NSVT after 4PM on 02/17/2018 - Personally Reviewed  ECG    No new EKG, last EKG 02/13/2018 which showed atrial flutter - Personally Reviewed  Physical Exam   GEN: No acute distress.   Neck: No JVD Cardiac: RRR, no murmurs, rubs, or gallops.  Respiratory: Clear to auscultation bilaterally. GI: Soft, nontender, non-distended  MS: No No deformity. 2-3+ pitting edema in bilateral LE Neuro:  Nonfocal  Psych: Normal affect   Labs    Chemistry Recent Labs  Lab 02/16/18 0413 02/16/18 1420 02/17/18 0444 02/18/18 0349  NA 136 137 136 138  K 5.3* 4.7 4.0 4.1    CL 96* 98* 98* 102  CO2 $Re'24 24 25 24  'ilk$ GLUCOSE 108* 120* 122* 103*  BUN 48* 52* 57* 50*  CREATININE 4.38* 4.50* 4.19* 3.27*  CALCIUM 9.0 8.9 8.7* 8.8*  PROT 6.4*  --  6.0* 6.2*  ALBUMIN 3.4*  --  3.2* 3.2*  AST 191*  --  98* 61*  ALT 622*  --  477* 358*  ALKPHOS 64  --  66 65  BILITOT 2.8*  --  1.8* 1.9*  GFRNONAA 13* 13* 14* 19*  GFRAA 16* 15* 16* 22*  ANIONGAP 16* $RemoveBefo'15 13 12     'hJJjmjzFbDb$ Hematology Recent Labs  Lab 02/16/18 0413 02/17/18 0444 02/18/18 0349  WBC 7.9 7.3 7.9  RBC 4.79 4.25 4.36  HGB 11.9* 10.6* 10.6*  HCT 37.5* 32.8* 33.6*  MCV 78.3 77.2* 77.1*  MCH 24.8* 24.9* 24.3*  MCHC 31.7 32.3 31.5  RDW 14.7 14.7 14.2  PLT 214 190 178    Cardiac Enzymes Recent Labs  Lab 02/14/18 0006  TROPONINI 0.10*   No results for input(s): TROPIPOC in the last 168 hours.   BNPNo results for input(s): BNP, PROBNP in the last  168 hours.   DDimer No results for input(s): DDIMER in the last 168 hours.   Radiology    Nm Tumor Localization W Spect  Result Date: 02/16/2018 CLINICAL DATA:  HEART FAILURE. CONCERN FOR CARDIAC AMYLOIDOSIS. EXAM: NUCLEAR MEDICINE TUMOR LOCALIZATION. PYP CARDIAC AMYLOIDOSIS SCAN WITH SPECT TECHNIQUE: Following intravenous administration of radiopharmaceutical, anterior planar images of the chest were obtained. Regions of interest were placed on the heart and contralateral chest wall for quantitative assessment. Additional SPECT imaging of the chest was obtained. RADIOPHARMACEUTICALS:  21.2 mCi TECHNETIUM 99 PYROPHOSPHATE FINDINGS: Planar Visual assessment: Anterior planar imaging demonstrates radiotracer uptake within the heart less than than uptake within the adjacent ribs (Grade 0). Quantitative assessment : Quantitative assessment of the cardiac uptake compared to the contralateral chest wall is equal to (H/CL = 1.17). SPECT assessment: SPECT imaging of the chest demonstrates uptake less than rib uptake radiotracer accumulation within the LEFT ventricle.  IMPRESSION: Visual and quantitative assessment (grade 0 to 1, H/CLL equal 1.17) are equivocal for transthyretin amyloidosis. Electronically Signed   By: Kerby Moors M.D.   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 It is probably stable allowing for limitations of inter modality comparison. CTA follow-up, if/when clinically feasible would better evaluate for interval change. Electronically Signed   By: Donavan Foil M.D.   On: 02/16/2018 17:05   Result Date: 02/16/2018 CLINICAL DATA:  Elevated creatinine EXAM: RENAL / URINARY TRACT ULTRASOUND COMPLETE COMPARISON:  CT abdomen pelvis 09/29/2012, ultrasound 09/28/2012 FINDINGS: Right Kidney: Length: 13.4 cm. Increased cortical echogenicity. Mild hydronephrosis of the right pelvis. Complex cystic mass upper pole right kidney with dominant cystic area measuring up to 5.9 cm, increased compared to prior sonogram. Left Kidney: Length: 11.4 cm. Increased cortical echogenicity. No hydronephrosis. Probable scarring lower pole left kidney. Bladder: Slightly thick-walled in appearance. Incidental note made of right pleural effusion. IMPRESSION: 1. Complex cystic area in the upper pole of the right kidney with adjacent anechoic structures, suspect that this is related to possible vascular malformation/AVM as suggested on prior sonogram; the dominant cystic area has increased in size compared to prior. When clinically feasible, suggest follow-up CT angiography. 2. There does however appear to be mild right hydronephrosis. There is no hydronephrosis on the left 3. Increased cortical echogenicity consistent with medical renal disease. Probable scarring in the lower pole of left  kidney 4. Thick walled urinary bladder, question cystitis Electronically Signed: By: Donavan Foil M.D. On: 02/16/2018 16:58    Cardiac Studies   [facility] 03/19/2015 Procedures   Coronary Stent Intervention of LAD  Left Heart Cath and Coronary Angiography  Conclusion    Prox RCA lesion, 20% stenosed.  RPDA lesion, 60% stenosed.  Mid Cx lesion, 90% stenosed.  Ost 1st Diag to 1st Diag lesion, 60% stenosed.  Ost LAD to Prox LAD lesion, 95% stenosed. There is a 0% residual stenosis post intervention.  A drug-eluting stent was placed.  Final Conclusions:  1. Severe two-vessel coronary artery disease. 2. Moderately elevated left ventricular end-diastolic pressure. 3. Successful angioplasty and drug-eluting stent placement to the proximal LAD.    Staged Coronary Intervention of Lcx 04/09/15 Procedures   Coronary Stent Intervention  Conclusion    Prox RCA lesion, 20% stenosed.  RPDA lesion, 60%  stenosed.  1st Diag lesion, 60% stenosed.  Mid Cx lesion, 90% stenosed. There is a 0% residual stenosis post intervention.  A drug-eluting stent was placed.  1. Patent LAD stent  2. Successful angioplasty and drug-eluting stent placement to the mid left circumflex.   2D Echo 03/2015   Study Conclusions  - Left ventricle: Severe LV wall thickening with basal septal hypertrophy - measuring 2.0 cm. There is a bright, speckled appearance of the intraventricular septum which may be consistent with infiltrative disease such as amyloidosis. No LVOT obstruction is noted. Systolic function was vigorous. The estimated ejection fraction was in the range of 65% to 70%. Wall motion was normal; there were no regional wall motion abnormalities. Doppler parameters are consistent with pseudormal left ventricular relaxation (grade 2 diastolic dysfunction). The E/A ratio is 2.5. The E/e&' ratio is >15, suggesting elevated LV filling pressure. - Aortic valve:  Sclerosis without stenosis. Transvalvular velocity was minimally increased. There was trivial regurgitation. Valve area (VTI): 3.14 cm^2. Valve area (Vmax): 3.11 cm^2. Valve area (Vmean): 3.21 cm^2. - Mitral valve: There was mild regurgitation. - Left atrium: Massively dilated at 77 ml/m2. - Right atrium: Massively dilated at 48 ml/m2. - Atrial septum: Aneurysmal IAS - PFO cannot be excluded. - Tricuspid valve: There was moderate regurgitation. - Pulmonary arteries: PA peak pressure: 56 mm Hg (S). - Inferior vena cava: The vessel was dilated. The respirophasic diameter changes were blunted (<50%), consistent with elevated central venous pressure.  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 Consider cardiac MRI to evaluate further. There is now severe biatrial enlargment. There is pseudonormal diastolic dysfunction with high LV filling pressure. RA pressure is also elevated. The interatrial septum is aneurysmal and a PFO cannot be excluded.   Echo 02/10/2018 LV EF: 30% -   35% Study Conclusions  - Left ventricle: There is marked hypertrophy of the   interventricular septum as well as the papillary muscles with a   speckled pattern to the myocardium suggestion possible   amyloidosis. The cavity size was severely dilated. There was   moderate concentric and severe asymmetric hypertrophy. Systolic   function was moderately to severely reduced. The estimated   ejection fraction was in the range of 30% to 35%. Moderate   diffuse hypokinesis with distinct regional wall motion   abnormalities. There is akinesis of the basal-midinferolateral   myocardium. The study is not technically sufficient to allow   evaluation of LV diastolic function. - Ventricular septum: The contour showed mild diastolic flattening   and mild systolic flattening. These changes are  consistent with   RV volume and pressure overload. - Aortic valve: Trileaflet; moderately thickened, moderately   calcified leaflets. - Mitral valve: There was moderate regurgitation. - Left atrium: The atrium was massively dilated. - Right ventricle: The cavity size was severely dilated. Wall   thickness was normal. Systolic function was moderately reduced. - Right atrium: The atrium was massively dilated. - Tricuspid valve: There was moderate regurgitation. - Pulmonic valve: There was trivial regurgitation. - Pulmonary arteries: PA peak pressure: 47 mm Hg (S). - Pericardium, extracardiac: A trivial, free-flowing pericardial   effusion was identified along the right atrial free wall. The   fluid had no internal echoes. - Impressions: 4 chamber enlargement with marked LVH and speckled   pattern of LV myocardium as well as trivial pericardial effusion   suggestive of infiltrative disease such as  amyloidosis. Compared   to prior echo, LV and RV function have significantly declined.   Recommend cardiac MRI.  Impressions:  - 4 chamber enlargement with marked LVH and speckled pattern of LV   myocardium as well as trivial pericardial effusion suggestive of   infiltrative disease such as amyloidosis. Compared to prior echo,   LV and RV function have significantly declined. Recommend cardiac   MRI. The right ventricular systolic pressure was increased   consistent with moderate pulmonary hypertension.   NM Tumor localization w spect 02/16/2018 FINDINGS: Planar Visual assessment:  Anterior planar imaging demonstrates radiotracer uptake within the heart less than than uptake within the adjacent ribs (Grade 0).  Quantitative assessment :  Quantitative assessment of the cardiac uptake compared to the contralateral chest wall is equal to (H/CL = 1.17).  SPECT assessment: SPECT imaging of the chest demonstrates uptake less than rib uptake radiotracer accumulation within the  LEFT ventricle.  IMPRESSION: Visual and quantitative assessment (grade 0 to 1, H/CLL equal 1.17) are equivocal for transthyretin amyloidosis.  Patient Profile     57 y.o. male with PMH of CAD s/p PCI 2016, HTN, CKD stage III  Assessment & Plan    1. New typical atrial flutter  - Underwent TEE DCCV 02/15/2018, had transient bradycardia and junctional rhythm afterward, now improved.  - low dose coreg 3.$RemoveBefo'125mg'[id number]$  BID added this morning, patient had asymptomatic NSVT yesterday   - continue coumadin, INR 2.02.   2. Acute systolic HF: EF 02% in 7253 with speckled appearance of intraventricular septum concerning for amyloidosis. Cardiac MRI cannot be performed due to renal failure  - Echo 02/10/2018 showed EF 35% , hopefully will recover with sinus rhythm   - NM Tumor localization w spect 02/16/2018 was equivocal for transthyretin amyloidosis  - continue coreg, would like to add Imdur/Hydralazine but BP when BP allows, will need to avoid hypotension due to recent hypotension related AKI  - 2-3+ pitting edema, but lung is largely clear, wish to hold off adding diuretic. Likely need diuretic prior to discharge.   3. CAD s/p PCI in 2016: no angina.   4. HLD  5. Elevated trop: suspected demand ischemia in the setting of acute CHF and rapid afib.   6. Medication noncompliance  7. CKD stage III: had acute injury after an episode of hypotension, Cr went up to 2.6. Baseline Cr 1.9-2.2. Cr peaked at 4.5, now trending down to 3.27.   8. Tobacco abuse   For questions or updates, please contact [place] Please consult [email or web address] for contact info under Cardiology/STEMI.      SignedAlmyra Deforest, PA  02/18/2018, 11:25 AM     I have seen, examined the patient, and reviewed the above assessment and plan.  Changes to above are made where necessary.  UPEP/SPEP, echo findings, and TN TUmor spect were reviewed with Dr Aundra Dubin.  Given UPEP/SPEP and TN tumor spect findings, there is low suspicion for  cardiac amyloid at this time.  Cannot have cardiac MRI with renal failure.  Could consider fat pad biopsy, however cannot stop anticoagulation for 30 days post cardioversion.  For now, would continue medical optimization as BP, renal function allow  Very complicated patient with high level of decision making required.  His long term prognosis is guarded.  Co Sign: Thompson Grayer, MD 02/18/2018 12:52 PM

## 2018-02-19 LAB — CBC
HCT: 33.1 % — ABNORMAL LOW (ref 39.0–52.0)
Hemoglobin: 10.4 g/dL — ABNORMAL LOW (ref 13.0–17.0)
MCH: 24.5 pg — AB (ref 26.0–34.0)
MCHC: 31.4 g/dL (ref 30.0–36.0)
MCV: 77.9 fL — ABNORMAL LOW (ref 78.0–100.0)
PLATELETS: 190 10*3/uL (ref 150–400)
RBC: 4.25 MIL/uL (ref 4.22–5.81)
RDW: 14.4 % (ref 11.5–15.5)
WBC: 7.2 10*3/uL (ref 4.0–10.5)

## 2018-02-19 LAB — BASIC METABOLIC PANEL
Anion gap: 11 (ref 5–15)
BUN: 40 mg/dL — AB (ref 6–20)
CO2: 25 mmol/L (ref 22–32)
CREATININE: 2.46 mg/dL — AB (ref 0.61–1.24)
Calcium: 8.7 mg/dL — ABNORMAL LOW (ref 8.9–10.3)
Chloride: 103 mmol/L (ref 101–111)
GFR calc Af Amer: 31 mL/min — ABNORMAL LOW (ref >60)
GFR, EST NON AFRICAN AMERICAN: 27 mL/min — AB (ref >60)
Glucose, Bld: 83 mg/dL (ref 65–99)
POTASSIUM: 4.6 mmol/L (ref 3.5–5.1)
SODIUM: 139 mmol/L (ref 135–145)

## 2018-02-19 LAB — PROTIME-INR
INR: 2.12
PROTHROMBIN TIME: 23.6 s — AB (ref 11.4–15.2)

## 2018-02-19 MED ORDER — APIXABAN 2.5 MG PO TABS: 2.5000 mg | ORAL_TABLET | Freq: Two times a day (BID) | ORAL | Status: DC

## 2018-02-19 MED ORDER — CARVEDILOL 3.125 MG PO TABS: 3.1250 mg | ORAL_TABLET | Freq: Two times a day (BID) | ORAL | 0 refills | Status: DC

## 2018-02-19 MED ORDER — SUCRALFATE 1 GM/10ML PO SUSP: 1.0000 g | Freq: Three times a day (TID) | ORAL | 0 refills | Status: DC

## 2018-02-19 MED ORDER — APIXABAN 2.5 MG PO TABS: 2.5000 mg | ORAL_TABLET | Freq: Two times a day (BID) | ORAL | 0 refills | Status: DC

## 2018-02-19 MED ORDER — APIXABAN 5 MG PO TABS: 5.0000 mg | ORAL_TABLET | Freq: Two times a day (BID) | ORAL | 0 refills | Status: DC

## 2018-02-19 MED ORDER — FERROUS SULFATE 325 (65 FE) MG PO TABS: 325.0000 mg | ORAL_TABLET | Freq: Three times a day (TID) | ORAL | 0 refills | Status: DC

## 2018-02-19 MED ORDER — WARFARIN SODIUM 7.5 MG PO TABS: 7.5000 mg | ORAL_TABLET | Freq: Once | ORAL | Status: DC

## 2018-02-19 MED ORDER — TRAMADOL HCL 50 MG PO TABS: 25.0000 mg | ORAL_TABLET | Freq: Two times a day (BID) | ORAL | 0 refills | Status: DC | PRN

## 2018-02-19 MED ORDER — APIXABAN 5 MG PO TABS: 5.0000 mg | ORAL_TABLET | Freq: Two times a day (BID) | ORAL | Status: DC

## 2018-02-19 MED ORDER — PANTOPRAZOLE SODIUM 40 MG PO TBEC: 40.0000 mg | DELAYED_RELEASE_TABLET | Freq: Every day | ORAL | 0 refills | Status: DC

## 2018-02-19 NOTE — Progress Notes (Signed)
Pt had a 9 beat run of vtach. Pt asymptomatic. I paged the doctor to be made aware. Awaiting return of call

## 2018-02-19 NOTE — Care Management Note (Signed)
Case Management Note  Patient Details  Name: Paul [name] MRN: [id number] Date of Birth: [birth year]  Subjective/Objective:                 Patient provided with 30 day card for Eliquis. Patient verbalized understanding for use. No other CM needs.    Action/Plan:   Expected Discharge Date:  02/19/18               Expected Discharge Plan:  Home/Self Care  In-House Referral:     Discharge planning Services  CM Consult  Post Acute Care Choice:    Choice offered to:     DME Arranged:    DME Agency:     HH Arranged:    HH Agency:     Status of Service:  Completed, signed off  If discussed at [employer] of Stay Meetings, dates discussed:    Additional Comments:  Carles Collet, RN 02/19/2018, 1:47 PM

## 2018-02-19 NOTE — Progress Notes (Addendum)
Family Medicine Teaching Service Daily Progress Note Intern Pager: [phone]  Patient name: Paul [name] Medical record number: [id number] Date of birth: Nov 19, 1960 Age: 57 y.o. Gender: male  Primary Care Provider: Rogue Bussing, MD Consultants: Cardiology, CCM Code Status: Full  Pt Overview and Major Events to Date:  Paul [name] is a 57 y.o. male presenting with shortness of breath and edema. PMH is significant for the CHF, CAD s/p LAD DES in 5/ 2016, hypertension, PAF, hyperlipidemia, CKD 4, tobacco use and arthritis  Assessment and Plan:  Atrial flutter, improved Rate controlled Sinus Bradycardic 60-80s. Did have run of asymptomatic NSVT with wide complex PVCs that have now resolved.  Normotensive overnight, last BP 115/80.  EKG on 4/14 with sinus rhythm. -started on low does coreg on 4/13 and tolerating well, will monitor HR -appreciate cardiology recs -On warfarin due to elevated creatinine.  Has been on 7.5 mg daily since 4/12; INR 2.12 on 4/14.  Approved to switch to 2.5 mg Eliquis BID by nephrology on 4/14.  History of Cocaine use Patient lasted used last November. Neg UDS on admission. Discussed the risk of beta blocker and cocaine use. Patient acknowledged this and said he would not use while taking his coreg -Cont coreg for rate control  Possible Amyloidosis TTE showed 4 chamber enlargement and speckled pattern of LV myocardium suggestive of infiltrative disease such as amyloidosis. TEE did not comment on similar findings. Cardiac NM Scan equivical for Amylodosis. SPEP not positive for MM. UPEP showed elevated Free Kappa 380  and Lamda Lt Chain 43, normal ratio. Diagnosis not consistent with TTR Amyloid. Could further evaluate with Cardiac MRI, but renal function would not tolerate it at this time. Alternative testing could include abdominal fat pad FNA vs. Biopsy, but pt needs to be on anticoagulation 30 days s/p cardioversion.  -recommend further outpatient  workup per PCP  CHF, stable EF 35-40% -SORA, wt 9 lbs down from admission, lower extremity edema improved with TED hose. UOP 4.3 L.  - nephrology recommendations -Cardiology recommendations -daily wt -strict I/Os -elevate legs, Ted hose for lower extremity edema, elevate scrotum  Shock with multiple organ failure including elevated LFTs Abdominal pain resolved, LFT improved -recommend following LFTs in outpatient setting after discharge to ensure the are continuing to downtrend -Avoid Tylenol in setting of elevated LFTs  AKI on CKD 2, stable Creatinine baseline 2.0. Cr 4.2 > 2.47. UOP 4.3L. -Enlarging complex cyst seen on Renal US concerning for possible vascular malformation/AVM, radiology recommended CT angiography to further evaluate when clinically feasible. Not pursued during this hospital admission insetting of elevated Cr.  -nephrology recommendations -CMP outpatient  CAD, stable Continue atorvastatin and carvedilol  Basilar Infiltrates on CXR Afebrile. No leukocytosis. CXR on admission showed diffuse basilar infiltrates consistatnt with edema/CHF. Repeat CXR showed improvement and resolution of infiltrates.  Antibiotic course Zosyn (4/5-4/9) augmentin for 7 days (4/9-4/11).  - monitor resp status  Right arm pain, stable During transfer patient fell out of wheelchair and IV got pulled out.  Patient has ongoing pain in his right arm to this. -pain well controlled on tramadol  Chronic back pain, stable Patient reports ongoing lower back pain.  He has had multiple hip surgeries and will use oxycodone at home for this pain.  Not being able to get around. Per discussion with PCP, pt was stable not on pain medication prior to admission.  -tramadol $RemoveBe'25mg'hxPkIZtdG$  q12h, received one dose early this am -PT/OT - No outpatient PT  Microcytic Anemia Baseline  Hg is 11-12. Pt currently 10.4 today. Iron studies showed low iron, normal ferritin, normal TIBC, low Sat ratio. Anemia of chronic  disease vs iron deficiency anemia.  -Will trial oral iron supplementation.  - cont further work up as outpatient. May need colonoscopy to rule out colon cancer  -CBC outpatient  FEN/GI: HHD PPx: eliquis  Disposition: likely home today  Subjective:  Patient is comfortable except for chronic pain.  Asked me to speak with his friend, who is a family physician.  His friend understood plan and mentioned the "subpar efforts" in locating patient's bridge.  I told him and the patient that we will do all that we can to locate it.  Objective: Temp:  [97.3 F (36.3 C)-98.5 F (36.9 C)] 97.5 F (36.4 C) (04/14 0735) Pulse Rate:  [58-80] 64 (04/14 0735) Resp:  [18-24] 21 (04/14 0452) BP: (103-119)/(71-97) 115/80 (04/14 0735) SpO2:  [97 %-100 %] 97 % (04/14 0452) Weight:  [188 lb (85.3 kg)] 188 lb (85.3 kg) (04/14 0452) Physical Exam: Gen: NAD, sitting on edge of bed, talkative CV: RRR, no MRG Pulm: CTAB bilaterally GI: soft, nontender, Nondistended. MSK: stable 2+ lower extremity edema Neuro: grossly normal, moves all extremities Psych: Normal affect and thought content  Laboratory: Recent Labs  Lab 02/17/18 0444 02/18/18 0349 02/19/18 0312  WBC 7.3 7.9 7.2  HGB 10.6* 10.6* 10.4*  HCT 32.8* 33.6* 33.1*  PLT 190 178 190   Recent Labs  Lab 02/16/18 0413 02/16/18 1420 02/17/18 0444 02/18/18 0349  NA 136 137 136 138  K 5.3* 4.7 4.0 4.1  CL 96* 98* 98* 102  CO2 $Re'24 24 25 24  'Xkw$ BUN 48* 52* 57* 50*  CREATININE 4.38* 4.50* 4.19* 3.27*  CALCIUM 9.0 8.9 8.7* 8.8*  PROT 6.4*  --  6.0* 6.2*  BILITOT 2.8*  --  1.8* 1.9*  ALKPHOS 64  --  66 65  ALT 622*  --  477* 358*  AST 191*  --  98* 61*  GLUCOSE 108* 120* 122* 103*   Renal Ultrasound 1. Complex cystic area in the upper pole of the right kidney with adjacent anechoic structures, suspect that this is related to possible vascular malformation/AVM as suggested on prior sonogram; the dominant cystic area has increased in size  compared to prior. When clinically feasible, suggest follow-up CT angiography. 2. There does however appear to be mild right hydronephrosis. There is no hydronephrosis on the left 3. Increased cortical echogenicity consistent with medical renal disease. Probable scarring in the lower pole of left kidney 4. Thick walled urinary bladder, question cystitis  Kathrene Alu, MD 02/19/2018, 8:41 AM PGY-1, [place] Intern pager: [phone], text pages welcome

## 2018-02-19 NOTE — Progress Notes (Signed)
Patient ID: Paul [name], male   DOB: [birth year], 59 y.o.   MRN: [id number] [facility] Progress Note   Assessment/ Plan:   1. Acute Kidney Injury: This appears to be possibly from ATN/hemodynamically mediated injury in the face of hypotension/bradycardia and CHF exacerbation with renal hypoperfusion injury.  Labs yesterday showed improving renal function and are pending this morning-excellent urine output overnight off of diuretics.  If creatinine continues to trend downwards-okay from renal standpoint to discharge home.  Discussed with primary service, Eliquis is a reasonable option with his chronic kidney disease. 2.  Atrial flutter with rapid ventricular response: Status post successful cardioversion and postprocedure bradycardia, metoprolol held. 3.  Acute exacerbation of systolic heart failure: Diuretics held after development of acute kidney injury/hypotension-he continues to maintain excellent urine output with net negative fluid balance. 4.  Anemia: Secondary to iron deficiency-status post intravenous Feraheme yesterday  Plans for discharge noted today-we will call the patient on Monday when the office is open and we are able to arrange outpatient follow-up.  I agree with Dr. Jackalyn Lombard assessment, he does not need diuretics upon discharge-  the indications for this be evaluated as an outpatient based on his volume status on follow-up.  Subjective:   Reports to be feeling fair and states that he has been urinating well overnight   Objective:   BP 115/80 (BP Location: Right Arm)   Pulse 64   Temp (!) 97.5 F (36.4 C) (Oral)   Resp (!) 21   Ht 6' (1.829 m)   Wt 85.3 kg (188 lb)   SpO2 97%   BMI 25.50 kg/m   Intake/Output Summary (Last 24 hours) at 02/19/2018 1014 Last data filed at 02/19/2018 0900 Gross per 24 hour  Intake 960 ml  Output 4275 ml  Net -3315 ml   Weight change: -1.497 kg (-3 lb 4.8 oz)  Physical Exam: Gen: Sitting comfortably on the side of his  bed CVS: Pulse regular rhythm, normal rate, S1 and S2 normal Resp: Diminished breath sounds over bases without rales, no wheeze/rhonchi Abd: Soft, flat, nontender Ext: Trace pitting lower extremity edema  Imaging: No results found.  Labs: BMET Recent Labs  Lab 02/13/18 0301 02/14/18 0006 02/15/18 0327 02/16/18 0413 02/16/18 1420 02/17/18 0444 02/18/18 0349  NA 138 137 140 136 137 136 138  K 4.3 4.9 4.4 5.3* 4.7 4.0 4.1  CL 104 103 98* 96* 98* 98* 102  CO2 $Re'24 24 29 24 24 25 24  'mZV$ GLUCOSE 120* 128* 109* 108* 120* 122* 103*  BUN 41* 39* 40* 48* 52* 57* 50*  CREATININE 3.01* 3.05* 3.12* 4.38* 4.50* 4.19* 3.27*  CALCIUM 7.9* 8.4* 8.8* 9.0 8.9 8.7* 8.8*   CBC Recent Labs  Lab 02/16/18 0413 02/17/18 0444 02/18/18 0349 02/19/18 0312  WBC 7.9 7.3 7.9 7.2  HGB 11.9* 10.6* 10.6* 10.4*  HCT 37.5* 32.8* 33.6* 33.1*  MCV 78.3 77.2* 77.1* 77.9*  PLT 214 190 178 190    Medications:    . carvedilol  3.125 mg Oral BID WC  . ferrous sulfate  325 mg Oral TID WC  . mouth rinse  15 mL Mouth Rinse BID  . pantoprazole  40 mg Oral Q1200  . sucralfate  1 g Oral TID WC & HS  . warfarin  7.5 mg Oral ONCE-1800  . Warfarin - Pharmacist Dosing Inpatient   Does not apply O1751   Elmarie Shiley, MD 02/19/2018, 10:14 AM

## 2018-02-19 NOTE — Progress Notes (Signed)
Dr. Shan Levans made aware of [facility] and stated it was ok to discharge him. That would not change his plan of care.

## 2018-02-19 NOTE — Progress Notes (Signed)
ANTICOAGULATION CONSULT NOTE  Pharmacy Consult:  Warfarin  Indication: atrial fibrillation  Allergies  Allergen Reactions  . Ibuprofen Other (See Comments)    Kidney damage  . Nsaids     REACTION: nsaid induced nephropathy    Patient Measurements: Height: 6' (182.9 cm) Weight: 188 lb (85.3 kg) IBW/kg (Calculated) : 77.6  Vital Signs: Temp: 97.5 F (36.4 C) (04/14 0735) Temp Source: Oral (04/14 0735) BP: 115/80 (04/14 0735) Pulse Rate: 64 (04/14 0735)  Labs: Recent Labs    02/16/18 1420  02/17/18 0444 02/18/18 0349 02/19/18 0312  HGB  --    < > 10.6* 10.6* 10.4*  HCT  --   --  32.8* 33.6* 33.1*  PLT  --   --  190 178 190  LABPROT  --   --  22.8* 22.7* 23.6*  INR  --   --  2.04 2.02 2.12  HEPARINUNFRC  --   --  0.77*  --   --   CREATININE 4.50*  --  4.19* 3.27*  --    < > = values in this interval not displayed.    Estimated Creatinine Clearance: 26.7 mL/min (A) (by C-G formula based on SCr of 3.27 mg/dL (H)).   Assessment: 79 YOM went to PCP for SOB and CP with LE edema and was sent to the ED for acute CHF +/- atrial flutter.  Patient has a history of PAF however was not on any anticoagulation.  He was started on IV heparin bridge to warfarin, heparin now discontinued with INR >2. Patient underwent DCCV on 4/10.    LFTs improving, eating well, no bleeding reported per note. INR today above target at 2.12. Slight drop in hemoglobin to 10.4. Platelets increased to 190.    Goal of Therapy:  INR 2-3 Heparin level 0.3-0.7 units/ml Monitor platelets by anticoagulation protocol: Yes    Plan:  Warfarin 7.$RemoveBef'5mg'kFnNftLpRM$  PO x 1 again this evening to keep INR >2 with recent DCCV  Daily PT / INR Monitor for S/Sx of bleeding   Jalene Mullet, Pharm.D. PGY1 Pharmacy Resident 02/19/2018 8:38 AM Main Pharmacy: [phone]

## 2018-02-19 NOTE — Progress Notes (Signed)
Plans for discharge noted.  See yesterday's note for details.  BP appears stable on current regimen.  Would defer diuretic considerations to nephrology.  May be best to discharge off diuretic and follow-up with primary care (transition of care visit) to reassess.  I will also schedule follow-up with Dr Hassell Done team as he has seen him previously.  Please call with questions.  Thompson Grayer MD, [facility] 02/19/2018 9:03 AM

## 2018-02-23 ENCOUNTER — Other Ambulatory Visit: Payer: Self-pay

## 2018-02-23 ENCOUNTER — Encounter: Payer: Self-pay | Admitting: Internal Medicine

## 2018-02-23 ENCOUNTER — Ambulatory Visit (INDEPENDENT_AMBULATORY_CARE_PROVIDER_SITE_OTHER): Payer: Medicare HMO | Admitting: Internal Medicine

## 2018-02-23 VITALS — BP 126/78 | HR 76 | Temp 97.6°F | Ht 72.0 in | Wt 197.0 lb

## 2018-02-23 DIAGNOSIS — I482 Chronic atrial fibrillation, unspecified: Secondary | ICD-10-CM

## 2018-02-23 DIAGNOSIS — I5032 Chronic diastolic (congestive) heart failure: Secondary | ICD-10-CM

## 2018-02-23 DIAGNOSIS — R600 Localized edema: Secondary | ICD-10-CM

## 2018-02-23 DIAGNOSIS — N189 Chronic kidney disease, unspecified: Secondary | ICD-10-CM

## 2018-02-23 MED ORDER — FUROSEMIDE 20 MG PO TABS: ORAL_TABLET | ORAL | 3 refills | Status: DC

## 2018-02-23 MED ORDER — TRAMADOL HCL 50 MG PO TABS: 50.0000 mg | ORAL_TABLET | Freq: Two times a day (BID) | ORAL | 0 refills | Status: DC | PRN

## 2018-02-23 NOTE — Patient Instructions (Signed)
Mr. Ellner,  Please wear your compression hose when active and elevate feet when resting.  Take lasix 20 mg daily if needed for weight gain of more than a couple pounds a day.  Continue eliquis and coreg.  Follow-up at the end of next week with [place] unless you are already seeing Nephrology. We should recheck kidney function at that time.  Best, Dr. Ola Spurr

## 2018-02-23 NOTE — Discharge Summary (Signed)
[facility] Discharge Summary  Patient name: Paul [name] Medical record number: [id number] Date of birth: [birth year] Age: 57 y.o. Gender: male Date of Admission: 02/09/2018  Date of Discharge: 02/19/2018  Admitting Physician: Reyne Dumas, MD  Primary Care Provider: Rogue Bussing, MD Consultants: Cardiology, Nephrology, CCM/Pulmonary, General Surgery  Indication for Hospitalization: Atrial flutter w/ RVR  Discharge Diagnoses/Problem List:  Patient Active Problem List   Diagnosis Date Noted  . History of cocaine use 02/18/2018  . Elevated LFTs   . Acute respiratory failure ([place])   . Shortness of breath 02/09/2018  . CHF exacerbation ([place]) 02/09/2018  . Atrial flutter ([place]) 02/09/2018  . Chronic kidney disease (CKD), stage IV (severe) ([place]) 07/17/2017  . Chronic pain syndrome 05/31/2016  . Osteoarthritis, knee 05/31/2016  . Chronic renal insufficiency, stage III (moderate) ([place]) 04/10/2015  . CAD - S/P LAD DES May 2016, staged CFX DES June 2016 04/08/2015  . Chronic diastolic CHF (congestive heart failure) ([place]) 03/27/2015  . Non-ST elevation myocardial infarction (NSTEMI), subsequent episode of care ([place]) 03/27/2015  . Stented coronary artery   . Microcytic anemia 03/19/2015  . 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 Hypomagnesemia 03/19/2015  . Leukocytosis 03/19/2015  . NSTEMI (non-ST elevated myocardial infarction) ([place]) 03/18/2015  . Acute diastolic CHF (congestive heart failure), NYHA class 4 (HCC) 03/18/2015    Class: Acute  . Essential hypertension 03/18/2015  . Lumbar strain 11/12/2014  . Arthritis of left hip 08/05/2014  . 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 Preventative health care 02/06/2013  . Sinus tachycardia 02/06/2013  . Right hand pain 12/27/2012  . S/P hip replacement 12/25/2012  . 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  Hypertensive cardiomyopathy ([place]) 09/24/2012  . Bilateral hip pain 09/05/2012  . Other, mixed, or unspecified nondependent drug abuse, unspecified 12/03/2008  . Hypertrophic obstructive cardiomyopathy ([place]) 08/07/2008  . ERECTILE DYSFUNCTION 04/23/2008  . GERD 02/21/2008  . PAF- last in 2013 02/17/2008  . BACK PAIN 10/11/2007  . Hyperlipidemia 05/05/2007  . TOBACCO USE 03/31/2007  . RHINITIS, ALLERGIC NOS 03/31/2007  . Renal failure 01/05/2007   Disposition: Home  Discharge Condition: Improved  Discharge Exam:  Gen: NAD, sitting on edge of bed, talkative CV: RRR, no MRG Pulm: CTAB bilaterally GI: soft, nontender, Nondistended. MSK: stable 2+ lower extremity edema Neuro: grossly normal, moves all extremities Psych: Normal affect and thought content  Brief Hospital Course:   Atrial Flutter Paul [name] is a 57 y.o. male who presented in atrial flutter with RVR HR 120s. He initally presented hypertensive in 130s/100s and was complaining of Chest pain and SOB. Patient then became hypotensive 80s/60s. Despite receiving multiple fluid boluses, he was still hypotensive. CCM was consulted and patient was started on pressors to maintain blood pressure. Cardiology was consulted and started the patient on pressors or rate control. Patient underwent cardiac cardioversion and tolerated this well. His HR was bradycardic in 60s after this and patient was rate controlled with carvedilol. Patient was also placed on Eliquis for anticoagulation at discharge.   CHF  As part of work up, patient had echocardiogram showing worsening EF of 35-40%. Patient was diureses until worsening renal function of in Scr 3-4 could not sustain it. Dieresis was stopped, but patient was able to maintain adequate UOP. In total he was 9 lbs down from admission.   Possible amyloidosis Patient TTE  showed speckled pattern possibly consistent with Amyloidosis.  Patient underwent NM scan of heart to evaluate for TTR  amyloidosis. Scan was inconsistent for this. Patient also underwent SPEP/UPEP that were negative for multiple myeloma, although patient did have elevated Kappa and Lambda light chains in urine. Abdominal FNA vs. Fat pad biopsy would be the confirmatory test. However, patient needs to be on anticoagulation 30 days s/p abblation, so this was defered to the outpatient setting. Alternative would be Cardiac MRI, but given renal function, may not be clinically feasable.   End Organ Damage secondary to Shock  When patient was hypotensive, he developed worsening renal function and elevated LFTs. AST/ALT peaked to 1200/1100 respectively. These continued to downtrend. Cr Peaked at 4.3 and was down trending at discharge.  Both of these were thought to be due to shock from hypotension. Patient did have abdominal u/s that showed Renal was consulted and recommended Urine studies and renal ultrasound. Ultimately renal work up did not reveal alternative cause of worsening renal function. Renal ultrasound showed suspectedarterial aneurysm. Radioogy recommended follow up w/ CTA of kidneys if was clinically feasible given renal function.   Asymptomatic cholelithiasis and calcified gallbladder As part of work up for elevated LFTs, patient had abdominal ultrasound that showed with diffuse gallbladder wall thickening and pericholecystic edema. Patient had a HIDA scan scan that showed normal gall bladder function. An incidental finding on a lumbar spine xray showed a calcified gallbladder consistant with porciline gallbladder. Radiology recommenced f/u with abdominal u/s in 3-6 months.   Pneumonia Patient had mild diffuse infiltrate on CXR. Procalcitionin was supportive of staring antibiotics. Antibiotic course Zosyn (4/5-4/9) augmentin for 7 days (4/9-4/11). Upon discharge patient had no leukocytisis, afebrile, and SORA.   Microcytic Anemia Patient had microcytic anemia on admission. Iron study showed low iron, normal  ferritin, normal TIBC, low Sat ratio. Consistant w/ Anemia of Chronic Disease vs. Iron deficiency anemia. He was trialed on iron supplaments. Will need outpatient colonoscopy to r/o colon cancer.     Issues for Follow Up:  1. Outpatient labs to f/u on: CBC, LFTs, Check Cr  2. Consider further work up of amyloidosis 3. Consider CTA renal, if Cr improves 4. Consier colonoscopy to evaluate for colon cancer 5. Outpatient follow up with cardiology 6. Outpatient follow up with nephrology  Significant Procedures: TEE, Cardioversion on 4/10  Significant Labs and Imaging:  Recent Labs  Lab 02/17/18 0444 02/18/18 0349 02/19/18 0312  WBC 7.3 7.9 7.2  HGB 10.6* 10.6* 10.4*  HCT 32.8* 33.6* 33.1*  PLT 190 178 190   Recent Labs  Lab 02/17/18 0444 02/18/18 0349 02/19/18 1026  NA 136 138 139  K 4.0 4.1 4.6  CL 98* 102 103  CO2 25 24 25   GLUCOSE 122* 103* 83  BUN 57* 50* 40*  CREATININE 4.19* 3.27* 2.46*  CALCIUM 8.7* 8.8* 8.7*  ALKPHOS 66 65  --   AST 98* 61*  --   ALT 477* 358*  --   ALBUMIN 3.2* 3.2*  --     Transthoracic Echocardiography ------------------------------------------------------------------- LV EF: 30% -   35% ------------------------------------------------------------------- History:   PMH:   Coronary artery disease.  Congestive heart failure.  PMH:   Myocardial infarction.  Risk factors: Hypertension. Dyslipidemia. ------------------------------------------------------------------- Study Conclusions - Left ventricle: There is marked hypertrophy of the   interventricular septum as well as the papillary muscles with a   speckled pattern to the myocardium suggestion possible   amyloidosis. The cavity size was severely dilated. There was   moderate concentric and severe asymmetric hypertrophy.  Systolic   function was moderately to severely reduced. The estimated   ejection fraction was in the range of 30% to 35%. Moderate   diffuse hypokinesis with  distinct regional wall motion   abnormalities. There is akinesis of the basal-midinferolateral   myocardium. The study is not technically sufficient to allow   evaluation of LV diastolic function. - Ventricular septum: The contour showed mild diastolic flattening   and mild systolic flattening. These changes are consistent with   RV volume and pressure overload. - Aortic valve: Trileaflet; moderately thickened, moderately   calcified leaflets. - Mitral valve: There was moderate regurgitation. - Left atrium: The atrium was massively dilated. - Right ventricle: The cavity size was severely dilated. Wall   thickness was normal. Systolic function was moderately reduced. - Right atrium: The atrium was massively dilated. - Tricuspid valve: There was moderate regurgitation. - Pulmonic valve: There was trivial regurgitation. - Pulmonary arteries: PA peak pressure: 47 mm Hg (S). - Pericardium, extracardiac: A trivial, free-flowing pericardial   effusion was identified along the right atrial free wall. The   fluid had no internal echoes. - Impressions: 4 chamber enlargement with marked LVH and speckled   pattern of LV myocardium as well as trivial pericardial effusion   suggestive of infiltrative disease such as amyloidosis. Compared   to prior echo, LV and RV function have significantly declined.   Recommend cardiac MRI.  Impressions: - 4 chamber enlargement with marked LVH and speckled pattern of LV   myocardium as well as trivial pericardial effusion suggestive of   infiltrative disease such as amyloidosis. Compared to prior echo,   LV and RV function have significantly declined. Recommend cardiac   MRI. The right ventricular systolic pressure was increased   consistent with moderate pulmonary hypertension.   NUCLEAR MEDICINE TUMOR LOCALIZATION. PYP CARDIAC AMYLOIDOSIS SCAN WITH SPECT  IMPRESSION: Visual and quantitative assessment (grade 0 to 1, H/CLL equal 1.17) are equivocal  for transthyretin amyloidosis.  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 It is probably stable allowing for limitations of inter modality comparison. CTA follow-up, if/when clinically feasible would better evaluate for interval change.  Dg Chest 2 View  Result Date: 02/09/2018 CLINICAL DATA:  Shortness of breath and wheezing for several days. Smoker. Coronary artery disease. Chronic kidney disease stage 3. EXAM: CHEST - 2 VIEW COMPARISON:  03/18/2015 FINDINGS: Stable mild cardiomegaly. Mild diffuse interstitial infiltrates and Kerley B-lines are seen, consistent with mild interstitial edema. No evidence of pulmonary consolidation. Tiny posterior pleural thickening or effusion is again seen bilaterally, without significant change. IMPRESSION: Mild diffuse interstitial infiltrates, suspicious for interstitial edema/congestive heart failure. Mild cardiomegaly. Electronically Signed   By: Earle Gell M.D.   On: 02/09/2018 13:24   Dg Chest Port 1 View  Result Date: 02/10/2018 CLINICAL DATA:  Shortness of breath. EXAM: PORTABLE CHEST 1 VIEW COMPARISON:  Radiograph of December 13, 2017. FINDINGS: Stable cardiomegaly and central pulmonary vascular congestion is noted. No pneumothorax is noted. Increased bibasilar opacities are noted concerning for edema or atelectasis with possible associated pleural effusions. Bony thorax is unremarkable. IMPRESSION: Stable cardiomegaly and central pulmonary vascular congestion. Increased bibasilar densities are noted concerning for edema or atelectasis with possible associated pleural effusions. Electronically Signed   By: Marijo Conception, M.D.   On: 02/10/2018 14:50   Dg  Chest Portable 1 View  Result Date: 02/10/2018 CLINICAL DATA:  Chest pain EXAM: PORTABLE  CHEST 1 VIEW COMPARISON:  Chest radiograph 02/09/2018 at 1:29 p.m. FINDINGS: Moderate cardiomegaly. Slightly improved aeration of the lungs. No focal consolidation. No pleural effusion or pneumothorax. IMPRESSION: Unchanged cardiomegaly with improved aeration of the lungs. No overt pulmonary edema. Electronically Signed   By: Ulyses Jarred M.D.   On: 02/10/2018 02:01   Dg Abd Portable 1v  Result Date: 02/10/2018 CLINICAL DATA:  Acute epigastric abdominal pain. EXAM: PORTABLE ABDOMEN - 1 VIEW COMPARISON:  Radiographs of October 03, 2012. FINDINGS: The bowel gas pattern is normal. Phleboliths are noted in the pelvis. Status post bilateral hip arthroplasties. IMPRESSION: No evidence of bowel obstruction or ileus. Electronically Signed   By: Marijo Conception, M.D.   On: 02/10/2018 14:51   US Abdomen Limited Ruq  Result Date: 02/10/2018 CLINICAL DATA:  Abdominal pain for 1 week. Elevated liver function studies and lactic acid. EXAM: ABDOMEN ULTRASOUND COMPLETE COMPARISON:  None. FINDINGS: Gallbladder: Cholelithiasis with 8 mm stone in the dependent gallbladder. Diffuse gallbladder wall thickening measuring up to 7 mm in diameter. Pericholecystic edema. Murphy's sign is negative. Common bile duct: Diameter: 4 mm, normal Liver: Small hyperechoic focal liver lesion in the right lobe measuring 1.2 cm diameter, likely cavernous hemangioma. Small amount of ascites demonstrated around the liver. Portal vein is patent on color Doppler imaging with normal direction of blood flow towards the liver. IVC: No abnormality visualized. Pancreas: Visualized portion unremarkable. Spleen: Size and appearance within normal limits. Right Kidney: Length: 11 cm. Echogenicity within normal limits. No mass or hydronephrosis visualized. Left Kidney: Length: 10.3 cm. Echogenicity within normal limits. No mass or hydronephrosis visualized. Abdominal aorta: No aneurysm visualized. Other findings: Small bilateral pleural effusions. IMPRESSION:  1. Cholelithiasis with diffuse gallbladder wall thickening and pericholecystic edema. Murphy's sign is negative. Indeterminate for cholecystitis but gallbladder wall thickening and edema could be related to liver disease and ascites. 2. Small amount of upper abdominal ascites. 3. Focal lesion in the right lobe of the liver consistent with cavernous hemangioma. 4. Small bilateral pleural effusions. Electronically Signed   By: Lucienne Capers M.D.   On: 02/10/2018 23:50   TEE Date: 02/15/18 Study Conclusions  - Left ventricle: Wall thickness was increased in a pattern of   moderate LVH. Systolic function was normal. Diffuse hypokinesis   worse in the anteroseptal and inferoseptal myocardium. - Mitral valve: There was mild regurgitation. - Left atrium: No evidence of thrombus in the atrial cavity or   appendage. No evidence of thrombus in the atrial cavity or   appendage. - Right atrium: No evidence of thrombus in the atrial cavity or   appendage. - Atrial septum: No defect or patent foramen ovale was identified   by color flow Doppler. - Pericardium, extracardiac: A trivial pericardial effusion was   identified.   Results/Tests Pending at Time of Discharge: None  Discharge Medications:  Allergies as of 02/19/2018      Reactions   Ibuprofen Other (See Comments)   Kidney damage   Nsaids    REACTION: nsaid induced nephropathy      Medication List    STOP taking these medications   oxyCODONE-acetaminophen 5-325 MG tablet Commonly known as:  PERCOCET/ROXICET     TAKE these medications   apixaban 5 MG Tabs tablet Commonly known as:  ELIQUIS Take 1 tablet (5 mg total) by mouth 2 (two) times daily.   carvedilol 3.125 MG tablet Commonly known  as:  COREG Take 1 tablet (3.125 mg total) by mouth 2 (two) times daily with a meal.   ferrous sulfate 325 (65 FE) MG tablet Take 1 tablet (325 mg total) by mouth 3 (three) times daily with meals. Take with stool softener.   pantoprazole  40 MG tablet Commonly known as:  PROTONIX Take 1 tablet (40 mg total) by mouth daily at 12 noon.   sildenafil 100 MG tablet Commonly known as:  VIAGRA Take 1 tablet (100 mg total) by mouth daily as needed for erectile dysfunction. 30 min-4 hrs before sexual activity. Do not use with nitro.   sucralfate 1 GM/10ML suspension Commonly known as:  CARAFATE Take 10 mLs (1 g total) by mouth 4 (four) times daily -  with meals and at bedtime.       Discharge Instructions: Please refer to Patient Instructions section of EMR for full details.  Patient was counseled important signs and symptoms that should prompt return to medical care, changes in medications, dietary instructions, activity restrictions, and follow up appointments.   Follow-Up Appointments: Follow-up Information    Rogue Bussing, MD. Go on 02/23/2018.   Specialty:  Family Medicine Why:  Please go to appointment at 9:50AM. Contact information: [place] [address] [phone]           Bonnita Hollow, MD 02/23/2018, 5:02 PM PGY-1, [place]

## 2018-02-24 LAB — BASIC METABOLIC PANEL WITH GFR
BUN/Creatinine Ratio: 14 (ref 9–20)
BUN: 27 mg/dL — ABNORMAL HIGH (ref 6–24)
CO2: 18 mmol/L — ABNORMAL LOW (ref 20–29)
Calcium: 8.8 mg/dL (ref 8.7–10.2)
Chloride: 109 mmol/L — ABNORMAL HIGH (ref 96–106)
Creatinine, Ser: 1.89 mg/dL — ABNORMAL HIGH (ref 0.76–1.27)
GFR calc Af Amer: 44 mL/min/1.73 — ABNORMAL LOW
GFR calc non Af Amer: 38 mL/min/1.73 — ABNORMAL LOW
Glucose: 85 mg/dL (ref 65–99)
Potassium: 4.8 mmol/L (ref 3.5–5.2)
Sodium: 143 mmol/L (ref 134–144)

## 2018-02-27 ENCOUNTER — Encounter: Payer: Self-pay | Admitting: Internal Medicine

## 2018-02-27 DIAGNOSIS — R600 Localized edema: Secondary | ICD-10-CM | POA: Insufficient documentation

## 2018-02-27 DIAGNOSIS — I482 Chronic atrial fibrillation, unspecified: Secondary | ICD-10-CM | POA: Insufficient documentation

## 2018-02-27 NOTE — Assessment & Plan Note (Signed)
-   Will check BMP for SCr

## 2018-02-27 NOTE — Progress Notes (Signed)
[name] Progress Note  Subjective:  Paul [name] is a 57 y.o. male with history of CHF, CADs/p LAD DES in 03/2015, hypertension, PAF, hyperlipidemia, CKD 4, tobacco use,andchronic pain 2/2 arthritis who presents for hospital follow-up for shortness of breath and edema related to afib with RVR and worsening EF (35-40%). He was hospitalized from 4/4-4/14/19. Over course of hospitalization, patient required pressor support and cardioversion. He was able to maintain rate control on low dose carvedilol thereafter. He was initially started on warfarin but was transitioned to eliquis prior to discharge. He was also treated for a pneumonia with zosyn then augmentin due to mild diffuse infiltrate on CXR. He was diuresed 9 lbs, but this was limited by worsening kidney function. There was concern for amyloidosis as cause of heart failure with contradictory testing (NM scan heart appeared inconsistent; had elevated kappa and lambda light chains in urine). Plan is for biopsy vs cardiac MRI pending renal function. Patient also with renal ultrasound showing possible arterial aneurysm with recommendation to follow with CTA, again pending renal function. Also with finding of gallbladder wall thickening but normal HIDA scan; radiology recommending ultrasound follow-up in 3-6 months.   Inpatient team recommended the following: Issues for Follow Up:  1. Outpatient labs to f/u on: CBC, LFTs, Check Cr  2. Consider further work up of amyloidosis 3. Consider CTA renal, if Cr improves 4. Consider colonoscopy to evaluate for colon cancer given microcytic anemia 5. Outpatient follow up with cardiology 6. Outpatient follow up with nephrology  Patient reports feeling fairly well since leaving the hospital but has continued to struggle with lower extremity edema and remains tired. He reports taking coreg and eliquis as prescribed. He has seen improvement in leg swelling with elevation and wearing  compression hose. He plans to buy a scale to monitor weights. He has not been following any fluid restriction plan like he had been while hospitalized.  He reports he has a cardiology follow-up 03/08/18. Has not heard from Nephrology yet about making follow-up. He reports ongoing patient experience issues related to an IV that got pulled out and losing his dentures but may be making some progress. ROS: Has had frequent BMs but no fever. Denies SOB or chest pain. Complains of pain from leg swelling and chronic hip pain.   Allergies  Allergen Reactions  . Ibuprofen Other (See Comments)    Kidney damage  . Nsaids     REACTION: nsaid induced nephropathy    Social History   Tobacco Use  . Smoking status: Light Tobacco Smoker    Packs/day: 0.25    Years: 10.00    Pack years: 2.50    Types: Cigarettes  . Smokeless tobacco: Never Used  Substance Use Topics  . Alcohol use: Yes    Alcohol/week: 1.2 oz    Types: 2 Cans of beer per week    Comment: occasional    Objective: Blood pressure 126/78, pulse 76, temperature 97.6 F (36.4 C), temperature source Oral, height 6' (1.829 m), weight 197 lb (89.4 kg), SpO2 99 %. Body mass index is 26.72 kg/m. Constitutional: Well-appearing male with obviously edematous legs, sitting up in chair HENT: MMM Cardiovascular: RRR, S1, S2, no m/r/g.  Pulmonary/Chest: Mild bibasilar crackles. Speaking quickly in completes, which seems to cause some increased WOB.  Abdominal: Soft. +BS, NT Musculoskeletal: 2-3+ pitting edema to knee Psychiatric: Normal mood and affect.  Vitals reviewed  Discharge weight from hospital 188 lbs.   Assessment/Plan: Chronic atrial fibrillation ([place]) -  S/p cardioversion. Rate controlled and on eliquis.   Lower extremity edema - Persistent despite control of afib. With slight improvement with compression hose and elevation of legs. Causing discomfort. Weight up 9 lbs since discharge. Also has evidence of some fluid overload on  lung exam with bibasilar crackles, though saturating 99% on RA.  - Per Cardiology and Nephrology notes during hospitalization, reasonable to diurese if needed with close monitoring of BP and kidney function - Recommended prn PO lasix 20 mg for weight gain of 2 or more lbs a day or more than 5 lbs in a week. Patient to obtain a scale.  - Continue iron for anemia.   Renal failure - Will check BMP for SCr  Chronic diastolic CHF (congestive heart failure) - Recent decline in EF with hope of improvement after acute illness.  - Cardiology hopes to add Imdur/Hydralazine as BP allows. Follow-up scheduled for first week of May.    Patient requested pain medication for chronic hip pain and leg pain. Explained I would not be prescribing previous hydrocodone given history of cocaine abuse and no plans for surgical management at this time. Did prescribe tramadol.   Follow-up next week for follow-up kidney function on lasix and rechecking LFTs. Consider scheduling for some of suggested follow-up imaging at that time.   Olene Floss, MD [place], PGY-3

## 2018-02-27 NOTE — Assessment & Plan Note (Signed)
-   Recent decline in EF with hope of improvement after acute illness.  - Cardiology hopes to add Imdur/Hydralazine as BP allows. Follow-up scheduled for first week of May.

## 2018-02-27 NOTE — Assessment & Plan Note (Signed)
-   Persistent despite control of afib. With slight improvement with compression hose and elevation of legs. Causing discomfort. Weight up 9 lbs since discharge. Also has evidence of some fluid overload on lung exam with bibasilar crackles, though saturating 99% on RA.  - Per Cardiology and Nephrology notes during hospitalization, reasonable to diurese if needed with close monitoring of BP and kidney function - Recommended prn PO lasix 20 mg for weight gain of 2 or more lbs a day or more than 5 lbs in a week. Patient to obtain a scale.  - Continue iron for anemia.

## 2018-02-27 NOTE — Assessment & Plan Note (Signed)
-   S/p cardioversion. Rate controlled and on eliquis.

## 2018-03-06 ENCOUNTER — Inpatient Hospital Stay: Payer: Medicare HMO | Admitting: Internal Medicine

## 2018-03-07 NOTE — Progress Notes (Signed)
Cardiology Office Note    Date:  03/08/2018   ID:  Paul [name], DOB [birth year], MRN [id number]  PCP:  Rogue Bussing, MD  Cardiologist: Larae Grooms, MD  Chief Complaint  Patient presents with  . Hospitalization Follow-up    History of Present Illness:  Paul [name] is a 57 y.o. male with history of CAD status post MI treated with DES to the LAD with staged PCI/DES to the circumflex in 2016.  He stopped his medications after 1 month.  Also has hypertension CKD stage III.  Patient presented to the hospital with respiratory distress, rapid atrial flutter and hypertension.  He then became hypotensive requiring pressors until he was cardioverted.  He was found to be profoundly lactic acidotic with acute on chronic renal insufficiency increased LFTs and cholelithiasis.  He under went successful cardioversion and then his heart rate became so metoprolol was held.  2D echo 02/10/2018 showed four-chamber enlargement with marked LVH and speckled pattern of LV myocardium and trivial pericardial effusion suggestive of infiltrative disease such as amyloidosis.  RV systolic pressure was increased consistent with moderate pulmonary hypertension.  LV EF 30 to 35% with moderate MR.  Patient diuresed in the hospital but was not sent home on diuretics because of renal insufficiency and hypotension.  He was told to take them as needed for weight gain of 2 or 3 pounds overnight.  Nuclear medicine tumor localization for cardiac amyloidosis were equivocal.  Patient did have Kappa and Lambda  light chains in his urine and would need a fat pad biopsy to confirm multiple myeloma but he needs to be on anticoagulation for 30 days post cardioversion. 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 Wanted to start hydralazine and Imdur when BP allows.  Discharge weight 191 197 at Dr. Blane Ohara office.  Patient comes in today for follow-up.  He  went to [place] because his son was suicidal over the weekend and left all his medications behind.  He missed 2 days of meds including his Eliquis.  He took his meds Monday and Tuesday.  He is now back in rapid atrial flutter at 130 bpm.  Overall he is pretty asymptomatic.  He does not feel his heart racing his weight is 188.  He has taken 3 or 4 Lasix altogether since he has been home.  He gets short of breath when trying to bend over to wash his legs because of chronic back problems.  Past Medical History:  Diagnosis Date  . Arthritis   . CAD (coronary artery disease)    a. 03/2015 NSTEMI/PCI: LM nl, LAD 95p (3.5x20 Promus DES), D1 60, D2/3 mild dzs, LCX 67m(plan for staged pci), OM1/2 min irregs, RCA 20p, RPDA 60.  .Marland KitchenCKD (chronic kidney disease), stage III ([place]   . GERD (gastroesophageal reflux disease)    occ  . HOCM (hypertrophic obstructive cardiomyopathy) ([place]    a. 03/2015 Echo: EF 65-60%, sev LV thickening w/ bright, speckled appearance of IV septum, Gr 2 DD, AoV sclerosis, triv AI, mild MR, LA 718mm^2, RA 4840m^2, aneurysmal IAS, mod TR, PASP 16m4m  . HyMarland Kitchenertension    denies  . PAF (paroxysmal atrial fibrillation) (H[facility])[place] remote isolated episode - 09/2012.  . TMJ arthralgia     Past Surgical History:  Procedure Laterality Date  . APPENDECTOMY    . CARDIAC CATHETERIZATION N/A 03/19/2015   Procedure: Left Heart Cath and Coronary Angiography;  Surgeon: MuhaRogue Jury  Ferne Reus, MD;  Location: [facility];  Service: Cardiovascular;  Laterality: N/A;  . CARDIAC CATHETERIZATION  03/19/2015   Procedure: Coronary Stent Intervention;  Surgeon: [name], MD;  Location: [facility];  Service: Cardiovascular;;  . CARDIAC CATHETERIZATION N/A 04/09/2015   Procedure: Coronary Stent Intervention;  Surgeon: [name], MD;  Location: [facility];  Service: Cardiovascular;  Laterality: N/A;  . CARDIOVERSION N/A 02/15/2018   Procedure: CARDIOVERSION;  Surgeon:  [name], MD;  Location: [facility];  Service: Cardiovascular;  Laterality: N/A;  . HIP SURGERY Left 74   lft   . LAPAROSCOPIC APPENDECTOMY  09/22/2012   Procedure: APPENDECTOMY LAPAROSCOPIC;  Surgeon: Harl Bowie, MD;  Location: [facility];  Service: General;;  . LAPAROSCOPY  09/22/2012   Procedure: LAPAROSCOPY DIAGNOSTIC;  Surgeon: Harl Bowie, MD;  Location: [facility];  Service: General;  Laterality: N/A;  . TEE WITHOUT CARDIOVERSION N/A 02/15/2018   Procedure: TRANSESOPHAGEAL ECHOCARDIOGRAM (TEE);  Surgeon: [name], MD;  Location: [facility];  Service: Cardiovascular;  Laterality: N/A;  . TOTAL HIP ARTHROPLASTY Right 03,01   x2  . TOTAL HIP ARTHROPLASTY WITH HARDWARE REMOVAL Left 08/05/2014   Procedure: TOTAL HIP ARTHROPLASTY WITH HARDWARE REMOVAL;  Surgeon: Kerin Salen, MD;  Location: [facility];  Service: Orthopedics;  Laterality: Left;    Current Medications: Current Meds  Medication Sig  . apixaban (ELIQUIS) 5 MG TABS tablet Take 1 tablet (5 mg total) by mouth 2 (two) times daily.  . carvedilol (COREG) 6.25 MG tablet Take 1 tablet (6.25 mg total) by mouth 2 (two) times daily with a meal.  . ferrous sulfate 325 (65 FE) MG tablet Take 1 tablet (325 mg total) by mouth 3 (three) times daily with meals. Take with stool softener.  . furosemide (LASIX) 20 MG tablet Take 1 tablet daily as needed for weight gain of more than 2 lbs a day or 5 lbs in a week.  . pantoprazole (PROTONIX) 40 MG tablet Take 1 tablet (40 mg total) by mouth daily at 12 noon.  . sildenafil (VIAGRA) 100 MG tablet Take 1 tablet (100 mg total) by mouth daily as needed for erectile dysfunction. 30 min-4 hrs before sexual activity. Do not use with nitro.  . traMADol (ULTRAM) 50 MG tablet Take 1-2 tablets (50-100 mg total) by mouth every 12 (twelve) hours as needed for severe pain.  . [DISCONTINUED] carvedilol (COREG) 3.125 MG tablet Take 1 tablet (3.125 mg total) by mouth 2 (two) times daily with a  meal.     Allergies:   Ibuprofen and Nsaids   Social History   Socioeconomic History  . Marital status: Divorced    Spouse name: Not on file  . Number of children: Not on file  . Years of education: Not on file  . Highest education level: Not on file  Occupational History  . Not on file  Social Needs  . Financial resource strain: Not on file  . Food insecurity:    Worry: Patient refused    Inability: Patient refused  . Transportation needs:    Medical: Patient refused    Non-medical: Patient refused  Tobacco Use  . Smoking status: Light Tobacco Smoker    Packs/day: 0.25    Years: 10.00    Pack years: 2.50    Types: Cigarettes  . Smokeless tobacco: Never Used  Substance and Sexual Activity  . Alcohol use: Yes    Alcohol/week: 1.2 oz    Types:  2 Cans of beer per week    Comment: occasional  . Drug use: No  . Sexual activity: Not Currently  Lifestyle  . Physical activity:    Days per week: Patient refused    Minutes per session: Patient refused  . Stress: Not on file  Relationships  . Social connections:    Talks on phone: Patient refused    Gets together: Patient refused    Attends religious service: Patient refused    Active member of club or organization: Patient refused    Attends meetings of clubs or organizations: Patient refused    Relationship status: Patient refused  Other Topics Concern  . Not on file  Social History Narrative  . Not on file     Family History:  The patient's family history includes Heart disease in his father and mother.   ROS:   Please see the history of present illness.    Review of Systems  Constitution: Negative.  HENT: Negative.   Cardiovascular: Positive for dyspnea on exertion.  Respiratory: Negative.   Endocrine: Negative.   Hematologic/Lymphatic: Negative.   Musculoskeletal: Positive for back pain, myalgias and stiffness.  Gastrointestinal: Negative.   Genitourinary: Negative.   Neurological: Negative.    All  other systems reviewed and are negative.   PHYSICAL EXAM:   VS:  BP (!) 122/92   Pulse (!) 130   Ht 5' 11.5" (1.816 m)   Wt 188 lb (85.3 kg)   BMI 25.86 kg/m   Physical Exam  GEN: Well nourished, well developed, in no acute distress  Neck: Light increase JVD, no carotid bruits, or masses Cardiac:RRR at 130 bpm 1/6 to 2/6 systolic murmur the left sternal border Respiratory: Decreased breath sounds with rales at the bases GI: soft, nontender, nondistended, + BS Ext: +1-2 edema bilaterally without cyanosis, clubbing,, Good distal pulses bilaterally Neuro:  Alert and Oriented x 3 Psych: euthymic mood, full affect  Wt Readings from Last 3 Encounters:  03/08/18 188 lb (85.3 kg)  02/23/18 197 lb (89.4 kg)  02/19/18 188 lb (85.3 kg)      Studies/Labs Reviewed:   EKG:  EKG is ordered today.  The ekg ordered today demonstrates atrial flutter at 130 bpm  Recent Labs: 02/09/2018: B Natriuretic Peptide 1,737.7; TSH 0.618 02/11/2018: Magnesium 1.9 02/18/2018: ALT 358 02/19/2018: Hemoglobin 10.4; Platelets 190 02/23/2018: BUN 27; Creatinine, Ser 1.89; Potassium 4.8; Sodium 143   Lipid Panel    Component Value Date/Time   CHOL 159 07/13/2017 1001   TRIG 72 07/13/2017 1001   HDL 56 07/13/2017 1001   CHOLHDL 2.8 07/13/2017 1001   CHOLHDL 2.9 09/28/2016 1641   VLDL 21 09/28/2016 1641   LDLCALC 89 07/13/2017 1001   LDLDIRECT 111 (H) 03/31/2007 2119    Additional studies/ records that were reviewed today include:   Nuclear scan for amyloidosis 02/2018 IMPRESSION: Visual and quantitative assessment (grade 0 to 1, H/CLL equal 1.17) are equivocal for transthyretin amyloidosis.     Electronically Signed   By: Kerby Moors M.D.   On: 02/16/2018 16:55  Cardiac catheterization 2016   Conclusion    Prox RCA lesion, 20% stenosed.  RPDA lesion, 60% stenosed.  1st Diag lesion, 60% stenosed.  Mid Cx lesion, 90% stenosed. There is a 0% residual stenosis post intervention.  A  drug-eluting stent was placed.   1. Patent LAD stent  2. Successful angioplasty and drug-eluting stent placement to the mid left circumflex.   Recommendations: Hydrated overnight, aggressive medical therapy. Dual antiplatelets  therapy for at least 1 years.     ECHO: 02/10/2018 - Left ventricle: There is marked hypertrophy of the   interventricular septum as well as the papillary muscles with a   speckled pattern to the myocardium suggestion possible   amyloidosis. The cavity size was severely dilated. There was   moderate concentric and severe asymmetric hypertrophy. Systolic   function was moderately to severely reduced. The estimated   ejection fraction was in the range of 30% to 35%. Moderate   diffuse hypokinesis with distinct regional wall motion   abnormalities. There is akinesis of the basal-midinferolateral   myocardium. The study is not technically sufficient to allow   evaluation of LV diastolic function. - Ventricular septum: The contour showed mild diastolic flattening   and mild systolic flattening. These changes are consistent with   RV volume and pressure overload. - Aortic valve: Trileaflet; moderately thickened, moderately   calcified leaflets. - Mitral valve: There was moderate regurgitation. - Left atrium: The atrium was massively dilated. - Right ventricle: The cavity size was severely dilated. Wall   thickness was normal. Systolic function was moderately reduced. - Right atrium: The atrium was massively dilated. - Tricuspid valve: There was moderate regurgitation. - Pulmonic valve: There was trivial regurgitation. - Pulmonary arteries: PA peak pressure: 47 mm Hg (S). - Pericardium, extracardiac: A trivial, free-flowing pericardial   effusion was identified along the right atrial free wall. The   fluid had no internal echoes. - Impressions: 4 chamber enlargement with marked LVH and speckled   pattern of LV myocardium as well as trivial pericardial effusion    suggestive of infiltrative disease such as amyloidosis. Compared   to prior echo, LV and RV function have significantly declined.   Recommend cardiac MRI.   Impressions:   - 4 chamber enlargement with marked LVH and speckled pattern of LV   myocardium as well as trivial pericardial effusion suggestive of   infiltrative disease such as amyloidosis. Compared to prior echo,   LV and RV function have significantly declined. Recommend cardiac   MRI. The right ventricular systolic pressure was increased   consistent with moderate pulmonary hypertension.     ASSESSMENT:    1. Atrial flutter, unspecified type ([place])   2. Acute on chronic combined systolic and diastolic CHF (congestive heart failure) ([place])   3. Cardiomyopathy due to hypertension, with heart failure ([place])   4. Chronic kidney disease (CKD), stage IV (severe) ([place])   5. Mixed hyperlipidemia   6. CAD - S/P LAD DES May 2016, staged CFX DES June 2016      PLAN:  In order of problems listed above:  Atrial flutter status post cardioversion in the hospital because of acute decompensation and hypotension.  Patient back in atrial flutter at 130 bpm.  Overall he is pretty asymptomatic.  He does have heart failure on exam.  Discussed with Dr. Curt Bears who recommends increasing Coreg to 6.25 mg twice daily and schedule him to see Dr. Lovena Le in the office here on Friday at 315.  I have also scheduled him to see Roderic Palau, NP in the A. fib clinic tomorrow because I am worried about him decompensating quickly.  She can cancel Fridays appointment if she deems necessary.  Advised patient to go to the emergency room if he becomes symptomatic with rapid heart rates, CHF or dizziness.  He missed his Eliquis dose on Saturday and Sunday as well as his other meds.  Will not be able to do cardioversion unless  he has a TEE.   Acute on chronic combined systolic and diastolic CHF LVEF 30 to 68% on echo.  Diuresed in the hospital but not sent home on  daily diuretics because of hypotension and renal insufficiency.  Was supposed to take as needed for weight gain 2 or 3 pounds overnight weight was up 9 pounds when he saw PCP 02/23/18-weight down to 188 pounds but he does have rales on exam and some edema.  I suspect this is secondary to rapid atrial flutter.  Will check labs including BNP and be met.  Cardiomyopathy with 2D echo suggestive of amyloid heart but not found on NM scan-testing limited at this time because of renal failure.   CKD stage IV creatinine 2.46 on 02/1418 down to 1.89 on 02/23/2018-recheck today.  CAD status post MI treated with staged DES to the LAD and circumflex 04/2015 stopped all his medicines after 1 month no angina.     Medication Adjustments/Labs and Tests Ordered: Current medicines are reviewed at length with the patient today.  Concerns regarding medicines are outlined above.  Medication changes, Labs and Tests ordered today are listed in the Patient Instructions below. Patient Instructions  Medication Instructions:  Your physician has recommended you make the following change in your medication:  1-INCREASE Coreg 6.25 mg by mouth twice daily.   Labwork: Your physician recommends that you have lab work today- BMET and BNP  Testing/Procedures: NONE  Follow-Up: Your physician recommends that you see Roderic Palau NP Thursday 03/09/18 at 10:30.  Your physician recommends that you see Dr. Lovena Le this Friday 03/10/18 at 3:15 pm.  Your physician wants you to follow-up in: 6 months with Dr. Irish Lack. You will receive a reminder letter in the mail two months in advance. If you don't receive a letter, please call our office to schedule the follow-up appointment.   If you need a refill on your cardiac medications before your next appointment, please call your pharmacy.       Sumner Boast, PA-C  03/08/2018 8:53 AM    [facility] [address], [place], [place]  [address] Phone:  [phone]; Fax: [phone]

## 2018-03-08 ENCOUNTER — Encounter: Payer: Self-pay | Admitting: Physician Assistant

## 2018-03-08 ENCOUNTER — Ambulatory Visit (INDEPENDENT_AMBULATORY_CARE_PROVIDER_SITE_OTHER): Payer: Medicare HMO | Admitting: Physician Assistant

## 2018-03-08 VITALS — BP 122/92 | HR 130 | Ht 71.5 in | Wt 188.0 lb

## 2018-03-08 DIAGNOSIS — Z9861 Coronary angioplasty status: Secondary | ICD-10-CM

## 2018-03-08 DIAGNOSIS — I4892 Unspecified atrial flutter: Secondary | ICD-10-CM

## 2018-03-08 DIAGNOSIS — I2510 Atherosclerotic heart disease of native coronary artery without angina pectoris: Secondary | ICD-10-CM | POA: Diagnosis not present

## 2018-03-08 DIAGNOSIS — I110 Hypertensive heart disease with heart failure: Secondary | ICD-10-CM | POA: Diagnosis not present

## 2018-03-08 DIAGNOSIS — N184 Chronic kidney disease, stage 4 (severe): Secondary | ICD-10-CM

## 2018-03-08 DIAGNOSIS — I43 Cardiomyopathy in diseases classified elsewhere: Secondary | ICD-10-CM | POA: Diagnosis not present

## 2018-03-08 DIAGNOSIS — I5043 Acute on chronic combined systolic (congestive) and diastolic (congestive) heart failure: Secondary | ICD-10-CM

## 2018-03-08 LAB — BASIC METABOLIC PANEL
BUN/Creatinine Ratio: 13 (ref 9–20)
BUN: 34 mg/dL — AB (ref 6–24)
CO2: 19 mmol/L — AB (ref 20–29)
CREATININE: 2.56 mg/dL — AB (ref 0.76–1.27)
Calcium: 8.8 mg/dL (ref 8.7–10.2)
Chloride: 111 mmol/L — ABNORMAL HIGH (ref 96–106)
GFR calc Af Amer: 30 mL/min/{1.73_m2} — ABNORMAL LOW (ref >59)
GFR calc non Af Amer: 26 mL/min/{1.73_m2} — ABNORMAL LOW (ref >59)
Glucose: 94 mg/dL (ref 65–99)
Potassium: 4.9 mmol/L (ref 3.5–5.2)
SODIUM: 144 mmol/L (ref 134–144)

## 2018-03-08 LAB — PRO B NATRIURETIC PEPTIDE: NT-Pro BNP: 13398 pg/mL — ABNORMAL HIGH (ref 0–210)

## 2018-03-08 MED ORDER — CARVEDILOL 6.25 MG PO TABS: 6.2500 mg | ORAL_TABLET | Freq: Two times a day (BID) | ORAL | 3 refills | Status: DC

## 2018-03-08 NOTE — Patient Instructions (Addendum)
Medication Instructions:  Your physician has recommended you make the following change in your medication:  1-INCREASE Coreg 6.25 mg by mouth twice daily.   Labwork: Your physician recommends that you have lab work today- BMET and BNP  Testing/Procedures: NONE  Follow-Up: Your physician recommends that you see Roderic Palau NP Thursday 03/09/18 at 10:30.  Your physician recommends that you see Dr. Lovena Le this Friday 03/10/18 at 3:15 pm.  Your physician wants you to follow-up in: 6 months with Dr. Irish Lack. You will receive a reminder letter in the mail two months in advance. If you don't receive a letter, please call our office to schedule the follow-up appointment.   If you need a refill on your cardiac medications before your next appointment, please call your pharmacy.

## 2018-03-09 ENCOUNTER — Encounter (HOSPITAL_COMMUNITY): Payer: Self-pay | Admitting: Nurse Practitioner

## 2018-03-09 ENCOUNTER — Ambulatory Visit (HOSPITAL_COMMUNITY)
Admission: RE | Admit: 2018-03-09 | Discharge: 2018-03-09 | Disposition: A | Discharge status: Home / Self Care | Payer: Medicare HMO | Source: Ambulatory Visit | Attending: Nurse Practitioner | Admitting: Nurse Practitioner

## 2018-03-09 VITALS — BP 110/76 | HR 132 | Ht 71.5 in | Wt 187.0 lb

## 2018-03-09 DIAGNOSIS — Z79891 Long term (current) use of opiate analgesic: Secondary | ICD-10-CM | POA: Insufficient documentation

## 2018-03-09 DIAGNOSIS — K219 Gastro-esophageal reflux disease without esophagitis: Secondary | ICD-10-CM | POA: Diagnosis not present

## 2018-03-09 DIAGNOSIS — I2510 Atherosclerotic heart disease of native coronary artery without angina pectoris: Secondary | ICD-10-CM | POA: Diagnosis not present

## 2018-03-09 DIAGNOSIS — Z96643 Presence of artificial hip joint, bilateral: Secondary | ICD-10-CM | POA: Insufficient documentation

## 2018-03-09 DIAGNOSIS — I480 Paroxysmal atrial fibrillation: Secondary | ICD-10-CM | POA: Diagnosis not present

## 2018-03-09 DIAGNOSIS — Z95828 Presence of other vascular implants and grafts: Secondary | ICD-10-CM | POA: Diagnosis not present

## 2018-03-09 DIAGNOSIS — I252 Old myocardial infarction: Secondary | ICD-10-CM | POA: Diagnosis not present

## 2018-03-09 DIAGNOSIS — Z79899 Other long term (current) drug therapy: Secondary | ICD-10-CM | POA: Insufficient documentation

## 2018-03-09 DIAGNOSIS — Z9889 Other specified postprocedural states: Secondary | ICD-10-CM | POA: Insufficient documentation

## 2018-03-09 DIAGNOSIS — I421 Obstructive hypertrophic cardiomyopathy: Secondary | ICD-10-CM | POA: Insufficient documentation

## 2018-03-09 DIAGNOSIS — I130 Hypertensive heart and chronic kidney disease with heart failure and stage 1 through stage 4 chronic kidney disease, or unspecified chronic kidney disease: Secondary | ICD-10-CM | POA: Insufficient documentation

## 2018-03-09 DIAGNOSIS — I509 Heart failure, unspecified: Secondary | ICD-10-CM | POA: Diagnosis not present

## 2018-03-09 DIAGNOSIS — N183 Chronic kidney disease, stage 3 (moderate): Secondary | ICD-10-CM | POA: Diagnosis not present

## 2018-03-09 DIAGNOSIS — Z955 Presence of coronary angioplasty implant and graft: Secondary | ICD-10-CM | POA: Diagnosis not present

## 2018-03-09 DIAGNOSIS — F17210 Nicotine dependence, cigarettes, uncomplicated: Secondary | ICD-10-CM | POA: Diagnosis not present

## 2018-03-09 DIAGNOSIS — Z8249 Family history of ischemic heart disease and other diseases of the circulatory system: Secondary | ICD-10-CM | POA: Insufficient documentation

## 2018-03-09 DIAGNOSIS — Z7901 Long term (current) use of anticoagulants: Secondary | ICD-10-CM | POA: Diagnosis not present

## 2018-03-09 DIAGNOSIS — Z886 Allergy status to analgesic agent status: Secondary | ICD-10-CM | POA: Insufficient documentation

## 2018-03-09 DIAGNOSIS — I483 Typical atrial flutter: Secondary | ICD-10-CM | POA: Diagnosis present

## 2018-03-09 NOTE — Progress Notes (Signed)
Primary Care Physician: Rogue Bussing, MD Referring Physician:Michelle Willards, [place] EP: establishing with Dr.Taylor  5/3   Paul [name] is a 57 y.o. male with a h/o HTN, CKD, stage lll, CAD, MI,s/p stents to the LAD/Circ, 2016,  Lost to f/u and stopped meds after one month.  Presented to [facility] with distress, rapid atrial flutter and hypertension.  He then became hypotensive requiring pressors until he was cardioverted.  He was found to be profoundly lactic acidotic with acute on chronic renal insufficiency increased LFTs and cholelithiasis.  He under went successful cardioversion and then his heart rate became so metoprolol was held.  2D echo 02/10/2018 showed four-chamber enlargement with marked LVH and speckled pattern of LV myocardium and trivial pericardial effusion suggestive of infiltrative disease such as amyloidosis.  RV systolic pressure was increased consistent with moderate pulmonary hypertension.  LV EF 30 to 35% with moderate MR.  Patient diuresed in the hospital but was not sent home on diuretics because of renal insufficiency and hypotension.  He was told to take them as needed for weight gain of 2 or 3 pounds overnight.  He came in for f/u with [name] yesterday and was back in atrial flutter with v rate around 130 bpm. He reported that he urgently had to go to [place] with an issue  With his son over the week, and left his meds behind, missing two days of Eliquis. His weight was stable, he is weighting daily and taking lasix only if weight is up 2 pounds. She spoke to Dr. Curt Bears who suggested to  Increase  his coreg to 6.125 mg bid which he is taking. Labs yesterday showed BNP at over 13,000 and creatinine at 2.56. She set up this appointment today for close f/u. He sees Dr. Lovena Le tomorrow .  Now in the afib clinic, pt still has typical atrial  flutter at 132 bpm. His weight is down a pound. He states that he feels much better than he did. He does not feel the fast  heart beat and even though he is short of breath with exertion, this has not worsened since being out of the hospital. LLE edema is improved but still present. He slept well last night with one pillow.     Today, he denies symptoms of palpitations, chest pain,   orthopnea, PND  dizziness, presyncope, syncope, or neurologic sequela.+ for exertional shortness of breath, LLE improved. The patient is tolerating medications without difficulties and is otherwise without complaint today.   Past Medical History:  Diagnosis Date  . Arthritis   . CAD (coronary artery disease)    a. 03/2015 NSTEMI/PCI: LM nl, LAD 95p (3.5x20 Promus DES), D1 60, D2/3 mild dzs, LCX 34m (plan for staged pci), OM1/2 min irregs, RCA 20p, RPDA 60.  Marland Kitchen CKD (chronic kidney disease), stage III ([place])   . GERD (gastroesophageal reflux disease)    occ  . HOCM (hypertrophic obstructive cardiomyopathy) ([place])    a. 03/2015 Echo: EF 65-60%, sev LV thickening w/ bright, speckled appearance of IV septum, Gr 2 DD, AoV sclerosis, triv AI, mild MR, LA 54ml/m^2, RA 53ml/m^2, aneurysmal IAS, mod TR, PASP 17mmHg.  Marland Kitchen Hypertension    denies  . PAF (paroxysmal atrial fibrillation) ([place])    remote isolated episode - 09/2012.  . TMJ arthralgia    Past Surgical History:  Procedure Laterality Date  . APPENDECTOMY    . CARDIAC CATHETERIZATION N/A 03/19/2015   Procedure: Left Heart Cath and Coronary Angiography;  Surgeon:  [name], MD;  Location: [place] [facility];  Service: Cardiovascular;  Laterality: N/A;  . CARDIAC CATHETERIZATION  03/19/2015   Procedure: Coronary Stent Intervention;  Surgeon: [name], MD;  Location: [place] [facility];  Service: Cardiovascular;;  . CARDIAC CATHETERIZATION N/A 04/09/2015   Procedure: Coronary Stent Intervention;  Surgeon: [name], MD;  Location: [place] [facility];  Service: Cardiovascular;  Laterality: N/A;  . CARDIOVERSION N/A 02/15/2018   Procedure: CARDIOVERSION;  Surgeon: [name], MD;  Location: [place];  Service: Cardiovascular;  Laterality: N/A;  . HIP SURGERY Left 74   lft   . LAPAROSCOPIC APPENDECTOMY  09/22/2012   Procedure: APPENDECTOMY LAPAROSCOPIC;  Surgeon: Harl Bowie, MD;  [facility];  Service: General;;  . LAPAROSCOPY  09/22/2012   Procedure: LAPAROSCOPY DIAGNOSTIC;  Surgeon: Harl Bowie, MD;  [facility];  Service: General;  Laterality: N/A;  . TEE WITHOUT CARDIOVERSION N/A 02/15/2018   Procedure: TRANSESOPHAGEAL ECHOCARDIOGRAM (TEE);  Surgeon: [name], MD;  Location: [place];  Service: Cardiovascular;  Laterality: N/A;  . TOTAL HIP ARTHROPLASTY Right 03,01   x2  . TOTAL HIP ARTHROPLASTY WITH HARDWARE REMOVAL Left 08/05/2014   Procedure: TOTAL HIP ARTHROPLASTY WITH HARDWARE REMOVAL;  Surgeon: Kerin Salen, MD;  Location: [place];  Service: Orthopedics;  Laterality: Left;    Current Outpatient Medications  Medication Sig Dispense Refill  . apixaban (ELIQUIS) 5 MG TABS tablet Take 1 tablet (5 mg total) by mouth 2 (two) times daily. 60 tablet 0  . carvedilol (COREG) 6.25 MG tablet Take 1 tablet (6.25 mg total) by mouth 2 (two) times daily with a meal. 180 tablet 3  . ferrous sulfate 325 (65 FE) MG tablet Take 1 tablet (325 mg total) by mouth 3 (three) times daily with meals. Take with stool softener. 90 tablet 0  . furosemide (LASIX) 20 MG tablet Take 1 tablet daily as needed for weight gain of more than 2 lbs a day or 5 lbs in a week. 30 tablet 3  . pantoprazole (PROTONIX) 40 MG tablet Take 1 tablet (40 mg total) by mouth daily at 12 noon. 30 tablet 0  . sildenafil (VIAGRA) 100 MG tablet Take 1 tablet (100 mg total) by mouth daily as needed for erectile dysfunction. 30 min-4 hrs before sexual activity. Do not use with nitro. 90 tablet 0  . traMADol (ULTRAM) 50 MG tablet Take 1-2 tablets (50-100 mg total) by mouth every 12 (twelve) hours as needed for severe pain. 60 tablet 0   No current  facility-administered medications for this encounter.     Allergies  Allergen Reactions  . Ibuprofen Other (See Comments)    Kidney damage  . Nsaids     REACTION: nsaid induced nephropathy    Social History   Socioeconomic History  . Marital status: Divorced    Spouse name: Not on file  . Number of children: Not on file  . Years of education: Not on file  . Highest education level: Not on file  Occupational History  . Not on file  Social Needs  . Financial resource strain: Not on file  . Food insecurity:    Worry: Patient refused    Inability: Patient refused  . Transportation needs:    Medical: Patient refused    Non-medical: Patient refused  Tobacco Use  . Smoking status: Light Tobacco Smoker    Packs/day: 0.25    Years: 10.00    Pack years: 2.50  Types: Cigarettes  . Smokeless tobacco: Never Used  Substance and Sexual Activity  . Alcohol use: Yes    Alcohol/week: 1.2 oz    Types: 2 Cans of beer per week    Comment: occasional  . Drug use: No  . Sexual activity: Not Currently  Lifestyle  . Physical activity:    Days per week: Patient refused    Minutes per session: Patient refused  . Stress: Not on file  Relationships  . Social connections:    Talks on phone: Patient refused    Gets together: Patient refused    Attends religious service: Patient refused    Active member of club or organization: Patient refused    Attends meetings of clubs or organizations: Patient refused    Relationship status: Patient refused  . Intimate partner violence:    Fear of current or ex partner: Patient refused    Emotionally abused: Patient refused    Physically abused: Patient refused    Forced sexual activity: Patient refused  Other Topics Concern  . Not on file  Social History Narrative  . Not on file    Family History  Problem Relation Age of Onset  . Heart disease Mother   . Heart disease Father     ROS- All systems are reviewed and negative except as per  the HPI above  Physical Exam: Vitals:   03/09/18 1050  BP: 110/76  Pulse: (!) 132  Weight: 187 lb (84.8 kg)  Height: 5' 11.5" (1.816 m)   Wt Readings from Last 3 Encounters:  03/09/18 187 lb (84.8 kg)  03/08/18 188 lb (85.3 kg)  02/23/18 197 lb (89.4 kg)    Labs: Lab Results  Component Value Date   NA 144 03/08/2018   K 4.9 03/08/2018   CL 111 (H) 03/08/2018   CO2 19 (L) 03/08/2018   GLUCOSE 94 03/08/2018   BUN 34 (H) 03/08/2018   CREATININE 2.56 (H) 03/08/2018   CALCIUM 8.8 03/08/2018   PHOS 6.0 (H) 02/11/2018   PHOS 6.0 (H) 02/11/2018   MG 1.9 02/11/2018   Lab Results  Component Value Date   INR 2.12 02/19/2018   Lab Results  Component Value Date   CHOL 159 07/13/2017   HDL 56 07/13/2017   LDLCALC 89 07/13/2017   TRIG 72 07/13/2017   BNP over 13,000, 03/09/18  GEN- The patient is well appearing, alert and oriented x 3 today.   Head- normocephalic, atraumatic Eyes-  Sclera clear, conjunctiva pink Ears- hearing intact Oropharynx- clear Neck- supple, no JVP Lymph- no cervical lymphadenopathy Lungs- bilateral rales in bases Heart- rapid regular rate and rhythm, no murmurs, rubs or gallops, PMI not laterally displaced GI- soft, NT, ND, + BS Extremities- no clubbing, cyanosis, 1+ lower leg edema bilaterally  MS- no significant deformity or atrophy Skin- no rash or lesion Psych- euthymic mood, full affect Neuro- strength and sensation are intact  EKG-  Typical Atrial flutter with 2:1 AV conduction at 132 bpm  Epic records reviewed TTE- Study Conclusions  - Left ventricle: There is marked hypertrophy of the   interventricular septum as well as the papillary muscles with a   speckled pattern to the myocardium suggestion possible   amyloidosis. The cavity size was severely dilated. There was   moderate concentric and severe asymmetric hypertrophy. Systolic   function was moderately to severely reduced. The estimated   ejection fraction was in the range of  30% to 35%. Moderate   diffuse hypokinesis with distinct regional  wall motion   abnormalities. There is akinesis of the basal-midinferolateral   myocardium. The study is not technically sufficient to allow   evaluation of LV diastolic function. - Ventricular septum: The contour showed mild diastolic flattening   and mild systolic flattening. These changes are consistent with   RV volume and pressure overload. - Aortic valve: Trileaflet; moderately thickened, moderately   calcified leaflets. - Mitral valve: There was moderate regurgitation. - Left atrium: The atrium was massively dilated. - Right ventricle: The cavity size was severely dilated. Wall   thickness was normal. Systolic function was moderately reduced. - Right atrium: The atrium was massively dilated. - Tricuspid valve: There was moderate regurgitation. - Pulmonic valve: There was trivial regurgitation. - Pulmonary arteries: PA peak pressure: 47 mm Hg (S). - Pericardium, extracardiac: A trivial, free-flowing pericardial   effusion was identified along the right atrial free wall. The   fluid had no internal echoes. - Impressions: 4 chamber enlargement with marked LVH and speckled   pattern of LV myocardium as well as trivial pericardial effusion   suggestive of infiltrative disease such as amyloidosis. Compared   to prior echo, LV and RV function have significantly declined.   Recommend cardiac MRI.  Impressions:  - 4 chamber enlargement with marked LVH and speckled pattern of LV   myocardium as well as trivial pericardial effusion suggestive of   infiltrative disease such as amyloidosis. Compared to prior echo,   LV and RV function have significantly declined. Recommend cardiac   MRI. The right ventricular systolic pressure was increased   consistent with moderate pulmonary hypertension.     Assessment and Plan: 1. Typical Atrial flutter with RVR Unfortunately with 2 missed does of eliquis over the weekend, he  is not an candidtae for cardioversion in the ER, and he did not want to explore same day outpt TEE guided cardioversion today He is at high risk of decompensation with RVR, but he currently reports that he feels fine.  I offered hospitalization but he declined The increase of coreg yesterday did not change his v rate but dropped systolic by 10 points and I do not want to drop pressure any more by increasing  coreg   2. Acute on chronic HF Continue to weigh daily Take lasix as directed for more than 2 lb weight gain in one day Creatinine is 2.56 but is trending down from 4.50 3 weeks ago  I discussed with Dr. Lovena Le that pt did want to go home and looks stable at this point,  but he is at hight risk for decompensation and this was explained to the pt, he was told to have a low threshold to go back to hospital,  otherwise f/u with Dr. Lovena Le tomorrow.  Geroge Baseman Jameia Makris, [facility] [address] [place], [place] [address] [phone]

## 2018-03-10 ENCOUNTER — Ambulatory Visit (INDEPENDENT_AMBULATORY_CARE_PROVIDER_SITE_OTHER): Payer: Medicare HMO | Admitting: Internal Medicine

## 2018-03-10 ENCOUNTER — Encounter: Payer: Self-pay | Admitting: Internal Medicine

## 2018-03-10 VITALS — BP 112/80 | HR 128 | Ht 71.5 in | Wt 188.6 lb

## 2018-03-10 DIAGNOSIS — I483 Typical atrial flutter: Secondary | ICD-10-CM

## 2018-03-10 LAB — CBC WITH DIFFERENTIAL/PLATELET
BASOS ABS: 0 10*3/uL (ref 0.0–0.2)
Basos: 0 %
EOS (ABSOLUTE): 0.1 10*3/uL (ref 0.0–0.4)
Eos: 3 %
HEMATOCRIT: 35.9 % — AB (ref 37.5–51.0)
Hemoglobin: 11 g/dL — ABNORMAL LOW (ref 13.0–17.7)
Immature Grans (Abs): 0 10*3/uL (ref 0.0–0.1)
Immature Granulocytes: 0 %
LYMPHS ABS: 1.5 10*3/uL (ref 0.7–3.1)
Lymphs: 26 %
MCH: 24.9 pg — ABNORMAL LOW (ref 26.6–33.0)
MCHC: 30.6 g/dL — AB (ref 31.5–35.7)
MCV: 81 fL (ref 79–97)
MONOS ABS: 0.6 10*3/uL (ref 0.1–0.9)
Monocytes: 10 %
NEUTROS PCT: 61 %
Neutrophils Absolute: 3.4 10*3/uL (ref 1.4–7.0)
PLATELETS: 203 10*3/uL (ref 150–379)
RBC: 4.41 x10E6/uL (ref 4.14–5.80)
RDW: 15.2 % (ref 12.3–15.4)
WBC: 5.6 10*3/uL (ref 3.4–10.8)

## 2018-03-10 NOTE — H&P (View-Only) (Signed)
HPI [name] is referred today by Paul [name] for evaluation of atrial flutter and associated diastolic heart failure. The patient is a very pleasant 57 yo man with chronic hip problems and multiple hip surgeries. He was found to have CHF and underwent TEE and DCCV. He has reverted back to atrial flutter. He presents today to discuss catheter ablation. He denies syncope and does not actually feel his SVT. He has mild peripheral edema in atrial flutter.  Allergies  Allergen Reactions  . Ibuprofen Other (See Comments)    Kidney damage  . Nsaids     REACTION: nsaid induced nephropathy     Current Outpatient Medications  Medication Sig Dispense Refill  . apixaban (ELIQUIS) 5 MG TABS tablet Take 1 tablet (5 mg total) by mouth 2 (two) times daily. 60 tablet 0  . carvedilol (COREG) 6.25 MG tablet Take 1 tablet (6.25 mg total) by mouth 2 (two) times daily with a meal. 180 tablet 3  . ferrous sulfate 325 (65 FE) MG tablet Take 1 tablet (325 mg total) by mouth 3 (three) times daily with meals. Take with stool softener. 90 tablet 0  . furosemide (LASIX) 20 MG tablet Take 1 tablet daily as needed for weight gain of more than 2 lbs a day or 5 lbs in a week. 30 tablet 3  . pantoprazole (PROTONIX) 40 MG tablet Take 1 tablet (40 mg total) by mouth daily at 12 noon. 30 tablet 0  . sildenafil (VIAGRA) 100 MG tablet Take 1 tablet (100 mg total) by mouth daily as needed for erectile dysfunction. 30 min-4 hrs before sexual activity. Do not use with nitro. 90 tablet 0  . traMADol (ULTRAM) 50 MG tablet Take 1-2 tablets (50-100 mg total) by mouth every 12 (twelve) hours as needed for severe pain. 60 tablet 0   No current facility-administered medications for this visit.      Past Medical History:  Diagnosis Date  . Arthritis   . CAD (coronary artery disease)    a. 03/2015 NSTEMI/PCI: LM nl, LAD 95p (3.5x20 Promus DES), D1 60, D2/3 mild dzs, LCX 55m (plan for staged pci), OM1/2 min irregs, RCA 20p,  RPDA 60.  Marland Kitchen CKD (chronic kidney disease), stage III ([place])   . GERD (gastroesophageal reflux disease)    occ  . HOCM (hypertrophic obstructive cardiomyopathy) ([place])    a. 03/2015 Echo: EF 65-60%, sev LV thickening w/ bright, speckled appearance of IV septum, Gr 2 DD, AoV sclerosis, triv AI, mild MR, LA 87ml/m^2, RA 34ml/m^2, aneurysmal IAS, mod TR, PASP 38mmHg.  Marland Kitchen Hypertension    denies  . PAF (paroxysmal atrial fibrillation) ([place])    remote isolated episode - 09/2012.  . TMJ arthralgia     ROS:   All systems reviewed and negative except as noted in the HPI.   Past Surgical History:  Procedure Laterality Date  . APPENDECTOMY    . CARDIAC CATHETERIZATION N/A 03/19/2015   Procedure: Left Heart Cath and Coronary Angiography;  Surgeon: [name], MD;  Location: [place] [facility];  Service: Cardiovascular;  Laterality: N/A;  . CARDIAC CATHETERIZATION  03/19/2015   Procedure: Coronary Stent Intervention;  Surgeon: [name], MD;  Location: [place] [facility];  Service: Cardiovascular;;  . CARDIAC CATHETERIZATION N/A 04/09/2015   Procedure: Coronary Stent Intervention;  Surgeon: [name], MD;  Location: [place] [facility];  Service: Cardiovascular;  Laterality: N/A;  . CARDIOVERSION N/A 02/15/2018   Procedure: CARDIOVERSION;  Surgeon: Chilton Si, MD;  Location: [facility];  Service: Cardiovascular;  Laterality: N/A;  . HIP SURGERY Left 74   lft   . LAPAROSCOPIC APPENDECTOMY  09/22/2012   Procedure: APPENDECTOMY LAPAROSCOPIC;  Surgeon: Shelly Rubenstein, MD;  [facility];  Service: General;;  . LAPAROSCOPY  09/22/2012   Procedure: LAPAROSCOPY DIAGNOSTIC;  Surgeon: Shelly Rubenstein, MD;  [facility];  Service: General;  Laterality: N/A;  . TEE WITHOUT CARDIOVERSION N/A 02/15/2018   Procedure: TRANSESOPHAGEAL ECHOCARDIOGRAM (TEE);  Surgeon: Chilton Si, MD;  Location: [facility]COPY;  Service: Cardiovascular;  Laterality: N/A;  . TOTAL HIP  ARTHROPLASTY Right 03,01   x2  . TOTAL HIP ARTHROPLASTY WITH HARDWARE REMOVAL Left 08/05/2014   Procedure: TOTAL HIP ARTHROPLASTY WITH HARDWARE REMOVAL;  Surgeon: Nestor Lewandowsky, MD;  Location: [facility];  Service: Orthopedics;  Laterality: Left;     Family History  Problem Relation Age of Onset  . Heart disease Mother   . Heart disease Father      Social History   Socioeconomic History  . Marital status: Divorced    Spouse name: Not on file  . Number of children: Not on file  . Years of education: Not on file  . Highest education level: Not on file  Occupational History  . Not on file  Social Needs  . Financial resource strain: Not on file  . Food insecurity:    Worry: Patient refused    Inability: Patient refused  . Transportation needs:    Medical: Patient refused    Non-medical: Patient refused  Tobacco Use  . Smoking status: Light Tobacco Smoker    Packs/day: 0.25    Years: 10.00    Pack years: 2.50    Types: Cigarettes  . Smokeless tobacco: Never Used  Substance and Sexual Activity  . Alcohol use: Yes    Alcohol/week: 1.2 oz    Types: 2 Cans of beer per week    Comment: occasional  . Drug use: No  . Sexual activity: Not Currently  Lifestyle  . Physical activity:    Days per week: Patient refused    Minutes per session: Patient refused  . Stress: Not on file  Relationships  . Social connections:    Talks on phone: Patient refused    Gets together: Patient refused    Attends religious service: Patient refused    Active member of club or organization: Patient refused    Attends meetings of clubs or organizations: Patient refused    Relationship status: Patient refused  . Intimate partner violence:    Fear of current or ex partner: Patient refused    Emotionally abused: Patient refused    Physically abused: Patient refused    Forced sexual activity: Patient refused  Other Topics Concern  . Not on file  Social History Narrative  . Not on file     BP  112/80   Pulse (!) 128   Ht 5' 11.5" (1.816 m)   Wt 188 lb 9.6 oz (85.5 kg)   SpO2 98%   BMI 25.94 kg/m   Physical Exam:  Well appearing NAD HEENT: Unremarkable Neck:  No JVD, no thyromegally Lymphatics:  No adenopathy Back:  No CVA tenderness Lungs:  Clear with no wheezes HEART:  Regular tachy rhythm, no murmurs, no rubs, no clicks Abd:  soft, positive bowel sounds, no organomegally, no rebound, no guarding Ext:  2 plus pulses, no edema, no cyanosis, no clubbing Skin:  No rashes no nodules Neuro:  CN II through XII intact, motor grossly intact  EKG - atrial flutter with a RVR   Assess/Plan: 1. Recurrent atrial flutter - he has undergone TEE and DCCV and reverted back to atrial flutter. He has missed a couple of his doses of Eliquis and I have recommended he return for EP study and ablation, preceeded by TEE. The risks/benefits/goals/expectation of the procedure were reviewed and he wishes to proceed. 2. 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 I expect this to improve after we get his rate controlled with ablation. 3. HTN - his blood pressure is well controlled. We will follow.  Mikle Bosworth.D.

## 2018-03-10 NOTE — Patient Instructions (Addendum)
Medication Instructions:  Your physician recommends that you continue on your current medications as directed. Please refer to the Current Medication list given to you today. If you need a refill on your cardiac medications before your next appointment, please call your pharmacy.   Labwork: You will get lab work today:  CBC.  Testing/Procedures: Your physician has requested that you have a TEE. During a TEE, sound waves are used to create images of your heart. It provides your doctor with information about the size and shape of your heart and how well your heart's chambers and valves are working. In this test, a transducer is attached to the end of a flexible tube that's guided down your throat and into your esophagus (the tube leading from you mouth to your stomach) to get a more detailed image of your heart. You are not awake for the procedure. Please see the instruction sheet given to you today. For further information please visit [email or web address].    You are scheduled for a TEE on Mar 13, 2018 with Dr. Stanford Breed prior to your ablation.  Your physician has recommended that you have an ablation. Catheter ablation is a medical procedure used to treat some cardiac arrhythmias (irregular heartbeats). During catheter ablation, a long, thin, flexible tube is put into a blood vessel in your groin (upper thigh), or neck. This tube is called an ablation catheter. It is then guided to your heart through the blood vessel. Radio frequency waves destroy small areas of heart tissue where abnormal heartbeats may cause an arrhythmia to start. Please see the instruction sheet given to you today.   Follow-Up:  You will follow up with Dr. Lovena Le 4 weeks after your procedure.  INSTRUCTIONS FOR YOUR ABLATION:  Please arrive at the [facility] main entrance of [place] [facility] at:  7:30 am on Mar 13, 2018 Do not eat or drink after midnight prior to procedure Do not take any medications the morning of the  procedure Plan for one night stay You will need someone to drive you home at discharge    Cardiac Ablation Cardiac ablation is a procedure to disable (ablate) a small amount of heart tissue in very specific places. The heart has many electrical connections. Sometimes these connections are abnormal and can cause the heart to beat very fast or irregularly. Ablating some of the problem areas can improve the heart rhythm or return it to normal. Ablation may be done for people who:  Have Wolff-Parkinson-White syndrome.  Have fast heart rhythms (tachycardia).  Have taken medicines for an abnormal heart rhythm (arrhythmia) that were not effective or caused side effects.  Have a high-risk heartbeat that may be life-threatening.  During the procedure, a small incision is made in the neck or the groin, and a long, thin, flexible tube (catheter) is inserted into the incision and moved to the heart. Small devices (electrodes) on the tip of the catheter will send out electrical currents. A type of X-ray (fluoroscopy) will be used to help guide the catheter and to provide images of the heart. Tell a health care provider about:  Any allergies you have.  All medicines you are taking, including vitamins, herbs, eye drops, creams, and over-the-counter medicines.  Any problems you or family members have had with anesthetic medicines.  Any blood disorders you have.  Any surgeries you have had.  Any medical conditions you have, such as kidney failure.  Whether you are pregnant or may be pregnant. What are the risks? Generally, this is  a safe procedure. However, problems may occur, including:  Infection.  Bruising and bleeding at the catheter insertion site.  Bleeding into the chest, especially into the sac that surrounds the heart. This is a serious complication.  Stroke or blood clots.  Damage to other structures or organs.  Allergic reaction to medicines or dyes.  Need for a permanent  pacemaker if the normal electrical system is damaged. A pacemaker is a small computer that sends electrical signals to the heart and helps your heart beat normally.  The procedure not being fully effective. This may not be recognized until months later. Repeat ablation procedures are sometimes required.  What happens before the procedure?  Follow instructions from your health care provider about eating or drinking restrictions.  Ask your health care provider about: ? Changing or stopping your regular medicines. This is especially important if you are taking diabetes medicines or blood thinners. ? Taking medicines such as aspirin and ibuprofen. These medicines can thin your blood. Do not take these medicines before your procedure if your health care provider instructs you not to.  Plan to have someone take you home from the hospital or clinic.  If you will be going home right after the procedure, plan to have someone with you for 24 hours. What happens during the procedure?  To lower your risk of infection: ? Your health care team will wash or sanitize their hands. ? Your skin will be washed with soap. ? Hair may be removed from the incision area.  An IV tube will be inserted into one of your veins.  You will be given a medicine to help you relax (sedative).  The skin on your neck or groin will be numbed.  An incision will be made in your neck or your groin.  A needle will be inserted through the incision and into a large vein in your neck or groin.  A catheter will be inserted into the needle and moved to your heart.  Dye may be injected through the catheter to help your surgeon see the area of the heart that needs treatment.  Electrical currents will be sent from the catheter to ablate heart tissue in desired areas. There are three types of energy that may be used to ablate heart tissue: ? Heat (radiofrequency energy). ? Laser energy. ? Extreme cold (cryoablation).  When the  necessary tissue has been ablated, the catheter will be removed.  Pressure will be held on the catheter insertion area to prevent excessive bleeding.  A bandage (dressing) will be placed over the catheter insertion area. The procedure may vary among health care providers and hospitals. What happens after the procedure?  Your blood pressure, heart rate, breathing rate, and blood oxygen level will be monitored until the medicines you were given have worn off.  Your catheter insertion area will be monitored for bleeding. You will need to lie still for a few hours to ensure that you do not bleed from the catheter insertion area.  Do not drive for 24 hours or as long as directed by your health care provider. Summary  Cardiac ablation is a procedure to disable (ablate) a small amount of heart tissue in very specific places. Ablating some of the problem areas can improve the heart rhythm or return it to normal.  During the procedure, electrical currents will be sent from the catheter to ablate heart tissue in desired areas. This information is not intended to replace advice given to you by your health care  provider. Make sure you discuss any questions you have with your health care provider. Document Released: 03/13/2009 Document Revised: 09/13/2016 Document Reviewed: 09/13/2016 Elsevier Interactive Patient Education  2018 [place].   Transesophageal Echocardiogram Transesophageal echocardiography (TEE) is a picture test of your heart using sound waves. The pictures taken can give very detailed pictures of your heart. This can help your doctor see if there are problems with your heart. TEE can check:  If your heart has blood clots in it.  How well your heart valves are working.  If you have an infection on the inside of your heart.  Some of the major arteries of your heart.  If your heart valve is working after a Office manager.  Your heart before a procedure that uses a shock to your heart  to get the rhythm back to normal.  What happens before the procedure?  Do not eat or drink for 6 hours before the procedure or as told by your doctor.  Make plans to have someone drive you home after the procedure. Do not drive yourself home.  An IV tube will be put in your arm. What happens during the procedure?  You will be given a medicine to help you relax (sedative). It will be given through the IV tube.  A numbing medicine will be sprayed or gargled in the back of your throat to help numb it.  The tip of the probe is placed into the back of your mouth. You will be asked to swallow. This helps to pass the probe into your esophagus.  Once the tip of the probe is in the right place, your doctor can take pictures of your heart.  You may feel pressure at the back of your throat. What happens after the procedure?  You will be taken to a recovery area so the sedative can wear off.  Your throat may be sore and scratchy. This will go away slowly over time.  You will go home when you are fully awake and able to swallow liquids.  You should have someone stay with you for the next 24 hours.  Do not drive or operate machinery for the next 24 hours. This information is not intended to replace advice given to you by your health care provider. Make sure you discuss any questions you have with your health care provider. Document Released: 08/22/2009 Document Revised: 04/01/2016 Document Reviewed: 04/26/2013 Elsevier Interactive Patient Education  [employer].

## 2018-03-10 NOTE — Progress Notes (Signed)
HPI Mr. Bargo is referred today by Roderic Palau for evaluation of atrial flutter and associated diastolic heart failure. The patient is a very pleasant 57 yo man with chronic hip problems and multiple hip surgeries. He was found to have CHF and underwent TEE and DCCV. He has reverted back to atrial flutter. He presents today to discuss catheter ablation. He denies syncope and does not actually feel his SVT. He has mild peripheral edema in atrial flutter.  Allergies  Allergen Reactions  . Ibuprofen Other (See Comments)    Kidney damage  . Nsaids     REACTION: nsaid induced nephropathy     Current Outpatient Medications  Medication Sig Dispense Refill  . apixaban (ELIQUIS) 5 MG TABS tablet Take 1 tablet (5 mg total) by mouth 2 (two) times daily. 60 tablet 0  . carvedilol (COREG) 6.25 MG tablet Take 1 tablet (6.25 mg total) by mouth 2 (two) times daily with a meal. 180 tablet 3  . ferrous sulfate 325 (65 FE) MG tablet Take 1 tablet (325 mg total) by mouth 3 (three) times daily with meals. Take with stool softener. 90 tablet 0  . furosemide (LASIX) 20 MG tablet Take 1 tablet daily as needed for weight gain of more than 2 lbs a day or 5 lbs in a week. 30 tablet 3  . pantoprazole (PROTONIX) 40 MG tablet Take 1 tablet (40 mg total) by mouth daily at 12 noon. 30 tablet 0  . sildenafil (VIAGRA) 100 MG tablet Take 1 tablet (100 mg total) by mouth daily as needed for erectile dysfunction. 30 min-4 hrs before sexual activity. Do not use with nitro. 90 tablet 0  . traMADol (ULTRAM) 50 MG tablet Take 1-2 tablets (50-100 mg total) by mouth every 12 (twelve) hours as needed for severe pain. 60 tablet 0   No current facility-administered medications for this visit.      Past Medical History:  Diagnosis Date  . Arthritis   . CAD (coronary artery disease)    a. 03/2015 NSTEMI/PCI: LM nl, LAD 95p (3.5x20 Promus DES), D1 60, D2/3 mild dzs, LCX 48m (plan for staged pci), OM1/2 min irregs, RCA 20p,  RPDA 60.  Marland Kitchen CKD (chronic kidney disease), stage III ([place])   . GERD (gastroesophageal reflux disease)    occ  . HOCM (hypertrophic obstructive cardiomyopathy) ([place])    a. 03/2015 Echo: EF 65-60%, sev LV thickening w/ bright, speckled appearance of IV septum, Gr 2 DD, AoV sclerosis, triv AI, mild MR, LA 53ml/m^2, RA 73ml/m^2, aneurysmal IAS, mod TR, PASP 60mmHg.  Marland Kitchen Hypertension    denies  . PAF (paroxysmal atrial fibrillation) ([place])    remote isolated episode - 09/2012.  . TMJ arthralgia     ROS:   All systems reviewed and negative except as noted in the HPI.   Past Surgical History:  Procedure Laterality Date  . APPENDECTOMY    . CARDIAC CATHETERIZATION N/A 03/19/2015   Procedure: Left Heart Cath and Coronary Angiography;  Surgeon: [name], MD;  Location: [place] [facility];  Service: Cardiovascular;  Laterality: N/A;  . CARDIAC CATHETERIZATION  03/19/2015   Procedure: Coronary Stent Intervention;  Surgeon: [name], MD;  Location: [place] [facility];  Service: Cardiovascular;;  . CARDIAC CATHETERIZATION N/A 04/09/2015   Procedure: Coronary Stent Intervention;  Surgeon: [name], MD;  Location: [place] [facility];  Service: Cardiovascular;  Laterality: N/A;  . CARDIOVERSION N/A 02/15/2018   Procedure: CARDIOVERSION;  Surgeon: Chilton Si, MD;  Location: [facility] ENDOSCOPY;  Service: Cardiovascular;  Laterality: N/A;  . HIP SURGERY Left 74   lft   . LAPAROSCOPIC APPENDECTOMY  09/22/2012   Procedure: APPENDECTOMY LAPAROSCOPIC;  Surgeon: Shelly Rubenstein, MD;  Location: [facility] OR[facility];  Service: General;;  . LAPAROSCOPY  09/22/2012   Procedure: LAPAROSCOPY DIAGNOSTIC;  Surgeon: Shelly Rubenstein, MD;  Location: [facility];  Service: General;  Laterality: N/A;  . TEE WITHOUT CARDIOVERSION N/A 02/15/2018   Procedure: TRANSESOPHAGEAL ECHOCARDIOGRAM (TEE);  Surgeon: Chilton Si, MD;  Location: [facility] ENDOSCOPY;  Service: Cardiovascular;  Laterality: N/A;  . TOTAL HIP  ARTHROPLASTY Right 03,01   x2  . TOTAL HIP ARTHROPLASTY WITH HARDWARE REMOVAL Left 08/05/2014   Procedure: TOTAL HIP ARTHROPLASTY WITH HARDWARE REMOVAL;  Surgeon: Nestor Lewandowsky, MD;  Location: [facility] OR;  Service: Orthopedics;  Laterality: Left;     Family History  Problem Relation Age of Onset  . Heart disease Mother   . Heart disease Father      Social History   Socioeconomic History  . Marital status: Divorced    Spouse name: Not on file  . Number of children: Not on file  . Years of education: Not on file  . Highest education level: Not on file  Occupational History  . Not on file  Social Needs  . Financial resource strain: Not on file  . Food insecurity:    Worry: Patient refused    Inability: Patient refused  . Transportation needs:    Medical: Patient refused    Non-medical: Patient refused  Tobacco Use  . Smoking status: Light Tobacco Smoker    Packs/day: 0.25    Years: 10.00    Pack years: 2.50    Types: Cigarettes  . Smokeless tobacco: Never Used  Substance and Sexual Activity  . Alcohol use: Yes    Alcohol/week: 1.2 oz    Types: 2 Cans of beer per week    Comment: occasional  . Drug use: No  . Sexual activity: Not Currently  Lifestyle  . Physical activity:    Days per week: Patient refused    Minutes per session: Patient refused  . Stress: Not on file  Relationships  . Social connections:    Talks on phone: Patient refused    Gets together: Patient refused    Attends religious service: Patient refused    Active member of club or organization: Patient refused    Attends meetings of clubs or organizations: Patient refused    Relationship status: Patient refused  . Intimate partner violence:    Fear of current or ex partner: Patient refused    Emotionally abused: Patient refused    Physically abused: Patient refused    Forced sexual activity: Patient refused  Other Topics Concern  . Not on file  Social History Narrative  . Not on file     BP  112/80   Pulse (!) 128   Ht 5' 11.5" (1.816 m)   Wt 188 lb 9.6 oz (85.5 kg)   SpO2 98%   BMI 25.94 kg/m   Physical Exam:  Well appearing NAD HEENT: Unremarkable Neck:  No JVD, no thyromegally Lymphatics:  No adenopathy Back:  No CVA tenderness Lungs:  Clear with no wheezes HEART:  Regular tachy rhythm, no murmurs, no rubs, no clicks Abd:  soft, positive bowel sounds, no organomegally, no rebound, no guarding Ext:  2 plus pulses, no edema, no cyanosis, no clubbing Skin:  No rashes no nodules Neuro:  CN II through XII intact, motor grossly intact  EKG - atrial flutter with a RVR   Assess/Plan: 1. Recurrent atrial flutter - he has undergone TEE and DCCV and reverted back to atrial flutter. He has missed a couple of his doses of Eliquis and I have recommended he return for EP study and ablation, preceeded by TEE. The risks/benefits/goals/expectation of the procedure were reviewed and he wishes to proceed. 2. 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 I expect this to improve after we get his rate controlled with ablation. 3. HTN - his blood pressure is well controlled. We will follow.  Mikle [email or web address].D.

## 2018-03-13 ENCOUNTER — Ambulatory Visit (HOSPITAL_COMMUNITY)
Admission: RE | Admit: 2018-03-13 | Discharge: 2018-03-14 | Disposition: A | Discharge status: Home / Self Care | Payer: Medicare HMO | Source: Ambulatory Visit | Attending: Internal Medicine | Admitting: Internal Medicine

## 2018-03-13 ENCOUNTER — Encounter (HOSPITAL_COMMUNITY)
Admission: RE | Disposition: A | Discharge status: Home / Self Care | Payer: Self-pay | Source: Ambulatory Visit | Attending: Internal Medicine

## 2018-03-13 ENCOUNTER — Ambulatory Visit (HOSPITAL_BASED_OUTPATIENT_CLINIC_OR_DEPARTMENT_OTHER)
Admission: RE | Admit: 2018-03-13 | Discharge: 2018-03-13 | Disposition: A | Discharge status: Home / Self Care | Payer: Medicare HMO | Source: Ambulatory Visit | Attending: Cardiology | Admitting: Cardiology

## 2018-03-13 ENCOUNTER — Encounter (HOSPITAL_COMMUNITY): Payer: Self-pay | Admitting: *Deleted

## 2018-03-13 DIAGNOSIS — I421 Obstructive hypertrophic cardiomyopathy: Secondary | ICD-10-CM | POA: Insufficient documentation

## 2018-03-13 DIAGNOSIS — I252 Old myocardial infarction: Secondary | ICD-10-CM | POA: Diagnosis not present

## 2018-03-13 DIAGNOSIS — I480 Paroxysmal atrial fibrillation: Secondary | ICD-10-CM | POA: Insufficient documentation

## 2018-03-13 DIAGNOSIS — Z79899 Other long term (current) drug therapy: Secondary | ICD-10-CM | POA: Insufficient documentation

## 2018-03-13 DIAGNOSIS — I483 Typical atrial flutter: Secondary | ICD-10-CM | POA: Diagnosis not present

## 2018-03-13 DIAGNOSIS — Z886 Allergy status to analgesic agent status: Secondary | ICD-10-CM | POA: Diagnosis not present

## 2018-03-13 DIAGNOSIS — Z96643 Presence of artificial hip joint, bilateral: Secondary | ICD-10-CM | POA: Insufficient documentation

## 2018-03-13 DIAGNOSIS — I4892 Unspecified atrial flutter: Secondary | ICD-10-CM

## 2018-03-13 DIAGNOSIS — Z7901 Long term (current) use of anticoagulants: Secondary | ICD-10-CM | POA: Diagnosis not present

## 2018-03-13 DIAGNOSIS — N183 Chronic kidney disease, stage 3 (moderate): Secondary | ICD-10-CM | POA: Diagnosis not present

## 2018-03-13 DIAGNOSIS — I340 Nonrheumatic mitral (valve) insufficiency: Secondary | ICD-10-CM

## 2018-03-13 DIAGNOSIS — K219 Gastro-esophageal reflux disease without esophagitis: Secondary | ICD-10-CM | POA: Diagnosis not present

## 2018-03-13 DIAGNOSIS — I2510 Atherosclerotic heart disease of native coronary artery without angina pectoris: Secondary | ICD-10-CM | POA: Diagnosis not present

## 2018-03-13 DIAGNOSIS — I5030 Unspecified diastolic (congestive) heart failure: Secondary | ICD-10-CM | POA: Diagnosis not present

## 2018-03-13 DIAGNOSIS — T827XXA Infection and inflammatory reaction due to other cardiac and vascular devices, implants and grafts, initial encounter: Secondary | ICD-10-CM | POA: Diagnosis present

## 2018-03-13 DIAGNOSIS — I130 Hypertensive heart and chronic kidney disease with heart failure and stage 1 through stage 4 chronic kidney disease, or unspecified chronic kidney disease: Secondary | ICD-10-CM | POA: Insufficient documentation

## 2018-03-13 DIAGNOSIS — F17210 Nicotine dependence, cigarettes, uncomplicated: Secondary | ICD-10-CM | POA: Diagnosis not present

## 2018-03-13 HISTORY — DX: Infection and inflammatory reaction due to other cardiac and vascular devices, implants and grafts, initial encounter: T82.7XXA

## 2018-03-13 HISTORY — PX: A-FLUTTER ABLATION: EP1230

## 2018-03-13 HISTORY — DX: Unspecified atrial flutter: I48.92

## 2018-03-13 HISTORY — PX: TEE WITHOUT CARDIOVERSION: SHX5443

## 2018-03-13 SURGERY — ECHOCARDIOGRAM, TRANSESOPHAGEAL
Anesthesia: Moderate Sedation

## 2018-03-13 SURGERY — A-FLUTTER ABLATION
Anesthesia: LOCAL

## 2018-03-13 MED ORDER — FERROUS SULFATE 325 (65 FE) MG PO TABS
325.0000 mg | ORAL_TABLET | Freq: Three times a day (TID) | ORAL | Status: DC
  Administered 2018-03-14: 325 mg via ORAL
  Filled 2018-03-13: qty 1

## 2018-03-13 MED ORDER — BUPIVACAINE HCL (PF) 0.25 % IJ SOLN
INTRAMUSCULAR | Status: AC
Start: 2018-03-13 — End: ?
  Filled 2018-03-13: qty 30

## 2018-03-13 MED ORDER — SODIUM CHLORIDE 0.9% FLUSH
3.0000 mL | Freq: Two times a day (BID) | INTRAVENOUS | Status: DC
  Administered 2018-03-13: 3 mL via INTRAVENOUS

## 2018-03-13 MED ORDER — SODIUM CHLORIDE 0.9 % IV SOLN: 250.0000 mL | INTRAVENOUS | Status: DC | PRN

## 2018-03-13 MED ORDER — ACETAMINOPHEN 325 MG PO TABS: 650.0000 mg | ORAL_TABLET | ORAL | Status: DC | PRN

## 2018-03-13 MED ORDER — LIDOCAINE HCL (PF) 1 % IJ SOLN: INTRAMUSCULAR | Status: DC | PRN

## 2018-03-13 MED ORDER — APIXABAN 5 MG PO TABS
5.0000 mg | ORAL_TABLET | Freq: Two times a day (BID) | ORAL | Status: DC
  Administered 2018-03-14: 5 mg via ORAL
  Filled 2018-03-13: qty 1

## 2018-03-13 MED ORDER — SODIUM CHLORIDE 0.9 % IV SOLN: INTRAVENOUS | Status: DC

## 2018-03-13 MED ORDER — FENTANYL CITRATE (PF) 100 MCG/2ML IJ SOLN
INTRAMUSCULAR | Status: AC
  Filled 2018-03-13: qty 2

## 2018-03-13 MED ORDER — BUTAMBEN-TETRACAINE-BENZOCAINE 2-2-14 % EX AERO
INHALATION_SPRAY | CUTANEOUS | Status: DC | PRN
  Administered 2018-03-13: 2 via TOPICAL

## 2018-03-13 MED ORDER — BUPIVACAINE HCL (PF) 0.25 % IJ SOLN
INTRAMUSCULAR | Status: AC
  Filled 2018-03-13: qty 30

## 2018-03-13 MED ORDER — HEPARIN (PORCINE) IN NACL 2-0.9 UNITS/ML
INTRAMUSCULAR | Status: AC | PRN
  Administered 2018-03-13: 500 mL

## 2018-03-13 MED ORDER — HEPARIN (PORCINE) IN NACL 1000-0.9 UT/500ML-% IV SOLN
INTRAVENOUS | Status: AC
  Filled 2018-03-13: qty 500

## 2018-03-13 MED ORDER — MIDAZOLAM HCL 5 MG/5ML IJ SOLN
INTRAMUSCULAR | Status: AC
  Filled 2018-03-13: qty 5

## 2018-03-13 MED ORDER — TRAMADOL HCL 50 MG PO TABS
50.0000 mg | ORAL_TABLET | Freq: Two times a day (BID) | ORAL | Status: DC | PRN
  Administered 2018-03-14: 100 mg via ORAL
  Filled 2018-03-13: qty 2

## 2018-03-13 MED ORDER — CARVEDILOL 6.25 MG PO TABS
6.2500 mg | ORAL_TABLET | Freq: Two times a day (BID) | ORAL | Status: DC
  Administered 2018-03-14: 6.25 mg via ORAL
  Filled 2018-03-13: qty 1

## 2018-03-13 MED ORDER — MIDAZOLAM HCL 5 MG/5ML IJ SOLN
INTRAMUSCULAR | Status: DC | PRN
  Administered 2018-03-13 (×8): 1 mg via INTRAVENOUS

## 2018-03-13 MED ORDER — SODIUM CHLORIDE 0.9% FLUSH: 3.0000 mL | INTRAVENOUS | Status: DC | PRN

## 2018-03-13 MED ORDER — BUPIVACAINE HCL (PF) 0.25 % IJ SOLN
INTRAMUSCULAR | Status: DC | PRN
  Administered 2018-03-13: 60 mL

## 2018-03-13 MED ORDER — DIPHENHYDRAMINE HCL 50 MG/ML IJ SOLN
INTRAMUSCULAR | Status: AC
  Filled 2018-03-13: qty 1

## 2018-03-13 MED ORDER — FENTANYL CITRATE (PF) 100 MCG/2ML IJ SOLN
INTRAMUSCULAR | Status: DC | PRN
  Administered 2018-03-13 (×3): 25 ug via INTRAVENOUS

## 2018-03-13 MED ORDER — CARVEDILOL 6.25 MG PO TABS: 6.2500 mg | ORAL_TABLET | Freq: Two times a day (BID) | ORAL | Status: DC

## 2018-03-13 MED ORDER — PANTOPRAZOLE SODIUM 40 MG PO TBEC: 40.0000 mg | DELAYED_RELEASE_TABLET | Freq: Every day | ORAL | Status: DC

## 2018-03-13 MED ORDER — FERROUS SULFATE 325 (65 FE) MG PO TABS: 325.0000 mg | ORAL_TABLET | Freq: Three times a day (TID) | ORAL | Status: DC

## 2018-03-13 MED ORDER — FENTANYL CITRATE (PF) 100 MCG/2ML IJ SOLN
INTRAMUSCULAR | Status: DC | PRN
  Administered 2018-03-13 (×4): 12.5 ug via INTRAVENOUS
  Administered 2018-03-13: 25 ug via INTRAVENOUS
  Administered 2018-03-13 (×2): 12.5 ug via INTRAVENOUS

## 2018-03-13 MED ORDER — MIDAZOLAM HCL 10 MG/2ML IJ SOLN
INTRAMUSCULAR | Status: DC | PRN
  Administered 2018-03-13: 1 mg via INTRAVENOUS
  Administered 2018-03-13 (×2): 2 mg via INTRAVENOUS
  Administered 2018-03-13: 1 mg via INTRAVENOUS

## 2018-03-13 MED ORDER — SODIUM CHLORIDE 0.9 % IV SOLN
INTRAVENOUS | Status: AC | PRN
  Administered 2018-03-13: 500 mL via INTRAVENOUS

## 2018-03-13 MED ORDER — FUROSEMIDE 40 MG PO TABS
40.0000 mg | ORAL_TABLET | Freq: Every day | ORAL | Status: DC
  Administered 2018-03-13 – 2018-03-14 (×2): 40 mg via ORAL
  Filled 2018-03-13 (×2): qty 1

## 2018-03-13 MED ORDER — MIDAZOLAM HCL 5 MG/ML IJ SOLN
INTRAMUSCULAR | Status: AC
  Filled 2018-03-13: qty 2

## 2018-03-13 MED ORDER — ONDANSETRON HCL 4 MG/2ML IJ SOLN: 4.0000 mg | Freq: Four times a day (QID) | INTRAMUSCULAR | Status: DC | PRN

## 2018-03-13 SURGICAL SUPPLY — 10 items
CATH EZ STEER NAV 8MM F-J CUR (ABLATOR) ×2 IMPLANT
CATH JOSEPHSON QUAD-ALLRED 6FR (CATHETERS) ×2 IMPLANT
CATH POLARIS X 2.5/5/2.5 DECAP (CATHETERS) ×2 IMPLANT
PACK EP LATEX FREE (CUSTOM PROCEDURE TRAY) ×1
PACK EP LF (CUSTOM PROCEDURE TRAY) ×1 IMPLANT
PAD DEFIB LIFELINK (PAD) ×2 IMPLANT
PATCH CARTO3 (PAD) ×2 IMPLANT
SHEATH PINNACLE 6F 10CM (SHEATH) ×2 IMPLANT
SHEATH PINNACLE 7F 10CM (SHEATH) ×2 IMPLANT
SHEATH PINNACLE 8F 10CM (SHEATH) ×2 IMPLANT

## 2018-03-13 NOTE — Progress Notes (Signed)
Consents completed for TEE and ablation per orders. Patient signed consents at this time, states he would like his son to also sign consents. He is not at the hospital this time. Advised patient physicians will be in to discuss procedure with his prior. He is unsure when his son will arrive to the hospital. Dr. Stanford Breed arrived to bedside at this time to discuss TEE with patient.

## 2018-03-13 NOTE — H&P (Signed)
Office Visit   03/10/2018 [facility], Champ Mungo, MD  Cardiology   Typical atrial flutter [facility])  Dx   Referred by Rogue Bussing, MD  Reason for Visit   Additional Documentation   Vitals:   BP 112/80   Pulse 128    Ht 5' 11.5" (1.816 m)   Wt 188 lb 9.6 oz (85.5 kg)   SpO2 98%   BMI 25.94 kg/m   BSA 2.08 m      More Vitals   Flowsheets:   Anthropometrics,   MEWS Score     Encounter Info:   Billing Info,   History,   Allergies,   Detailed Report     All Notes   Progress Notes by Evans Lance, MD at 03/10/2018 10:45 AM  Author: Evans Lance, MD Author Type: Physician Filed: 03/10/2018 5:34 PM  Note Status: Signed Cosign: Cosign Not Required Encounter Date: 03/10/2018  Editor: Evans Lance, MD (Physician)         HPI Mr. Fricke is referred today by Roderic Palau for evaluation of atrial flutter and associated diastolic heart failure. The patient is a very pleasant 57 yo man with chronic hip problems and multiple hip surgeries. He was found to have CHF and underwent TEE and DCCV. He has reverted back to atrial flutter. He presents today to discuss catheter ablation. He denies syncope and does not actually feel his SVT. He has mild peripheral edema in atrial flutter.       Allergies  Allergen Reactions  . Ibuprofen Other (See Comments)    Kidney damage  . Nsaids     REACTION: nsaid induced nephropathy           Current Outpatient Medications  Medication Sig Dispense Refill  . apixaban (ELIQUIS) 5 MG TABS tablet Take 1 tablet (5 mg total) by mouth 2 (two) times daily. 60 tablet 0  . carvedilol (COREG) 6.25 MG tablet Take 1 tablet (6.25 mg total) by mouth 2 (two) times daily with a meal. 180 tablet 3  . ferrous sulfate 325 (65 FE) MG tablet Take 1 tablet (325 mg total) by mouth 3 (three) times daily with meals. Take with stool softener. 90 tablet 0  . furosemide (LASIX) 20 MG tablet Take 1 tablet daily as  needed for weight gain of more than 2 lbs a day or 5 lbs in a week. 30 tablet 3  . pantoprazole (PROTONIX) 40 MG tablet Take 1 tablet (40 mg total) by mouth daily at 12 noon. 30 tablet 0  . sildenafil (VIAGRA) 100 MG tablet Take 1 tablet (100 mg total) by mouth daily as needed for erectile dysfunction. 30 min-4 hrs before sexual activity. Do not use with nitro. 90 tablet 0  . traMADol (ULTRAM) 50 MG tablet Take 1-2 tablets (50-100 mg total) by mouth every 12 (twelve) hours as needed for severe pain. 60 tablet 0   No current facility-administered medications for this visit.          Past Medical History:  Diagnosis Date  . Arthritis   . CAD (coronary artery disease)    a. 03/2015 NSTEMI/PCI: LM nl, LAD 95p (3.5x20 Promus DES), D1 60, D2/3 mild dzs, LCX 9m (plan for staged pci), OM1/2 min irregs, RCA 20p, RPDA 60.  Marland Kitchen CKD (chronic kidney disease), stage III ([place])   . GERD (gastroesophageal reflux disease)    occ  . HOCM (hypertrophic obstructive cardiomyopathy) ([place])    a.  03/2015 Echo: EF 65-60%, sev LV thickening w/ bright, speckled appearance of IV septum, Gr 2 DD, AoV sclerosis, triv AI, mild MR, LA 71ml/m^2, RA 61ml/m^2, aneurysmal IAS, mod TR, PASP 28mmHg.  Marland Kitchen Hypertension    denies  . PAF (paroxysmal atrial fibrillation) ([place])    remote isolated episode - 09/2012.  . TMJ arthralgia     ROS:   All systems reviewed and negative except as noted in the HPI.        Past Surgical History:  Procedure Laterality Date  . APPENDECTOMY    . CARDIAC CATHETERIZATION N/A 03/19/2015   Procedure: Left Heart Cath and Coronary Angiography;  Surgeon: [name], MD;  Location: [place] [facility];  Service: Cardiovascular;  Laterality: N/A;  . CARDIAC CATHETERIZATION  03/19/2015   Procedure: Coronary Stent Intervention;  Surgeon: [name], MD;  Location: [place] [facility];  Service: Cardiovascular;;  . CARDIAC CATHETERIZATION N/A 04/09/2015   Procedure:  Coronary Stent Intervention;  Surgeon: [name], MD;  Location: [place] [facility];  Service: Cardiovascular;  Laterality: N/A;  . CARDIOVERSION N/A 02/15/2018   Procedure: CARDIOVERSION;  Surgeon: [name], MD;  Location: [place];  Service: Cardiovascular;  Laterality: N/A;  . HIP SURGERY Left 74   lft   . LAPAROSCOPIC APPENDECTOMY  09/22/2012   Procedure: APPENDECTOMY LAPAROSCOPIC;  Surgeon: Harl Bowie, MD;  Location: [facility];  Service: General;;  . LAPAROSCOPY  09/22/2012   Procedure: LAPAROSCOPY DIAGNOSTIC;  Surgeon: Harl Bowie, MD;  Location: [facility];  Service: General;  Laterality: N/A;  . TEE WITHOUT CARDIOVERSION N/A 02/15/2018   Procedure: TRANSESOPHAGEAL ECHOCARDIOGRAM (TEE);  Surgeon: [name], MD;  Location: [place];  Service: Cardiovascular;  Laterality: N/A;  . TOTAL HIP ARTHROPLASTY Right 03,01   x2  . TOTAL HIP ARTHROPLASTY WITH HARDWARE REMOVAL Left 08/05/2014   Procedure: TOTAL HIP ARTHROPLASTY WITH HARDWARE REMOVAL;  Surgeon: Kerin Salen, MD;  Location: [place];  Service: Orthopedics;  Laterality: Left;          Family History  Problem Relation Age of Onset  . Heart disease Mother   . Heart disease Father      Social History        Socioeconomic History  . Marital status: Divorced    Spouse name: Not on file  . Number of children: Not on file  . Years of education: Not on file  . Highest education level: Not on file  Occupational History  . Not on file  Social Needs  . Financial resource strain: Not on file  . Food insecurity:    Worry: Patient refused    Inability: Patient refused  . Transportation needs:    Medical: Patient refused    Non-medical: Patient refused  Tobacco Use  . Smoking status: Light Tobacco Smoker    Packs/day: 0.25    Years: 10.00    Pack years: 2.50    Types: Cigarettes  . Smokeless tobacco: Never Used  Substance and Sexual Activity  .  Alcohol use: Yes    Alcohol/week: 1.2 oz    Types: 2 Cans of beer per week    Comment: occasional  . Drug use: No  . Sexual activity: Not Currently  Lifestyle  . Physical activity:    Days per week: Patient refused    Minutes per session: Patient refused  . Stress: Not on file  Relationships  . Social connections:    Talks on phone: Patient refused  Gets together: Patient refused    Attends religious service: Patient refused    Active member of club or organization: Patient refused    Attends meetings of clubs or organizations: Patient refused    Relationship status: Patient refused  . Intimate partner violence:    Fear of current or ex partner: Patient refused    Emotionally abused: Patient refused    Physically abused: Patient refused    Forced sexual activity: Patient refused  Other Topics Concern  . Not on file  Social History Narrative  . Not on file     BP 112/80   Pulse (!) 128   Ht 5' 11.5" (1.816 m)   Wt 188 lb 9.6 oz (85.5 kg)   SpO2 98%   BMI 25.94 kg/m   Physical Exam:  Well appearing NAD HEENT: Unremarkable Neck:  No JVD, no thyromegally Lymphatics:  No adenopathy Back:  No CVA tenderness Lungs:  Clear with no wheezes HEART:  Regular tachy rhythm, no murmurs, no rubs, no clicks Abd:  soft, positive bowel sounds, no organomegally, no rebound, no guarding Ext:  2 plus pulses, no edema, no cyanosis, no clubbing Skin:  No rashes no nodules Neuro:  CN II through XII intact, motor grossly intact  EKG - atrial flutter with a RVR   Assess/Plan: 1. Recurrent atrial flutter - he has undergone TEE and DCCV and reverted back to atrial flutter. He has missed a couple of his doses of Eliquis and I have recommended he return for EP study and ablation, preceeded by TEE. The risks/benefits/goals/expectation of the procedure were reviewed and he wishes to proceed. 2. 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 I  expect this to improve after we get his rate controlled with ablation. 3. HTN - his blood pressure is well controlled. We will follow.  Gregg Taylor,M.D.     For TEE; no changes Kirk Ruths

## 2018-03-13 NOTE — Progress Notes (Signed)
  Echocardiogram Echocardiogram Transesophageal has been performed.  Paul [name] 03/13/2018, 9:48 AM

## 2018-03-13 NOTE — Progress Notes (Signed)
    Transesophageal Echocardiogram Note  Paul [name] [id number] 28-Oct-1961  Procedure: Transesophageal Echocardiogram Indications: Atrial flutter   Procedure Details Consent: Obtained Time Out: Verified patient identification, verified procedure, site/side was marked, verified correct patient position, special equipment/implants available, Radiology Safety Procedures followed,  medications/allergies/relevent history reviewed, required imaging and test results available.  Performed  Medications:  During this procedure the patient is administered a total of Versed 6 mg and Fentanyl 75 mcg  to achieve and maintain moderate conscious sedation.  The patient's heart rate, blood pressure, and oxygen saturation are monitored continuously during the procedure. The period of conscious sedation is 30 minutes, of which I was present face-to-face 100% of this time.   Severe global reduction in LV systolic function; severe LVH; severe RV dysfunction; moderate MR; moderate biatrial enlargement; no LAA thrombus.  Complications: No apparent complications Patient did tolerate procedure well.  Kirk Ruths, MD

## 2018-03-13 NOTE — Interval H&P Note (Signed)
History and Physical Interval Note:  03/13/2018 10:34 AM  Paul [name]  has presented today for surgery, with the diagnosis of aflutter with rvr  The various methods of treatment have been discussed with the patient and family. After consideration of risks, benefits and other options for treatment, the patient has consented to  Procedure(s): A-FLUTTER ABLATION (N/A) as a surgical intervention .  The patient's history has been reviewed, patient examined, no change in status, stable for surgery.  I have reviewed the patient's chart and labs.  Questions were answered to the patient's satisfaction.     Cristopher Peru

## 2018-03-13 NOTE — Interval H&P Note (Signed)
History and Physical Interval Note:  03/13/2018 8:50 AM  Paul [name]  has presented today for surgery, with the diagnosis of AFLUTTER WITH RVR  The various methods of treatment have been discussed with the patient and family. After consideration of risks, benefits and other options for treatment, the patient has consented to  Procedure(s): TRANSESOPHAGEAL ECHOCARDIOGRAM (TEE) (N/A) as a surgical intervention .  The patient's history has been reviewed, patient examined, no change in status, stable for surgery.  I have reviewed the patient's chart and labs.  Questions were answered to the patient's satisfaction.     Kirk Ruths

## 2018-03-13 NOTE — Progress Notes (Signed)
Site area: rt groin Site Prior to Removal:  Level 0 Pressure Applied For:20 minutes Manual:   Yes 3 sheath venous 69fr/7fr/6fr Patient Status During Pull: asleep  Post Pull Site:  Level 0 Post Pull Instructions Given:   Post Pull Pulses Present:  Dressing Applied:  yes Bedrest begins @  12;50 Comments:

## 2018-03-13 NOTE — Progress Notes (Signed)
Received pt from Cath lab,alert and oriented,no complaints of any pain or discomfort. Right groin site dressing CDI, level 0. Will endorsed to night RN.

## 2018-03-13 NOTE — Progress Notes (Signed)
Verbal consent obtained from patient prior to IV start by this RN.

## 2018-03-13 NOTE — Discharge Instructions (Signed)
Post procedure care instructions No driving for 4 days. No lifting over 5 lbs for 1 week. No vigorous or sexual activity for 1 week. You may return to work on 03/20/18. Keep procedure site clean & dry. If you notice increased pain, swelling, bleeding or pus, call/return!  You may shower, but no soaking baths/hot tubs/pools for 1 week.

## 2018-03-14 ENCOUNTER — Other Ambulatory Visit: Payer: Self-pay

## 2018-03-14 ENCOUNTER — Encounter (HOSPITAL_COMMUNITY): Payer: Self-pay | Admitting: Internal Medicine

## 2018-03-14 DIAGNOSIS — K219 Gastro-esophageal reflux disease without esophagitis: Secondary | ICD-10-CM | POA: Diagnosis not present

## 2018-03-14 DIAGNOSIS — I4892 Unspecified atrial flutter: Secondary | ICD-10-CM | POA: Diagnosis not present

## 2018-03-14 DIAGNOSIS — I252 Old myocardial infarction: Secondary | ICD-10-CM | POA: Diagnosis not present

## 2018-03-14 DIAGNOSIS — I2510 Atherosclerotic heart disease of native coronary artery without angina pectoris: Secondary | ICD-10-CM | POA: Diagnosis not present

## 2018-03-14 MED ORDER — OXYCODONE-ACETAMINOPHEN 5-325 MG PO TABS
1.0000 | ORAL_TABLET | Freq: Once | ORAL | Status: AC
  Administered 2018-03-14: 1 via ORAL
  Filled 2018-03-14: qty 1

## 2018-03-14 MED ORDER — KETOROLAC TROMETHAMINE 30 MG/ML IJ SOLN: 30.0000 mg | Freq: Once | INTRAMUSCULAR | Status: DC

## 2018-03-14 NOTE — Progress Notes (Signed)
Pt has orders to be discharged. Discharge instructions given and pt has no additional questions at this time. Medication regimen reviewed and pt educated. Pt verbalized understanding and has no additional questions. Telemetry box removed. IV removed and site in good condition. Pt stable and waiting for transportation. 

## 2018-03-14 NOTE — Discharge Summary (Addendum)
ELECTROPHYSIOLOGY PROCEDURE DISCHARGE SUMMARY    Patient ID: Paul [name],  MRN: [id number], DOB/AGE: 01-11-1961 57 y.o.  Admit date: 03/13/2018 Discharge date: 03/14/2018  Primary Care Physician: Rogue Bussing, MD  Primary Cardiologist: Dr. Irish Lack Electrophysiologist: Dr. Lovena Le  Primary Discharge Diagnosis:  1. Atrial flutter status post ablation this admission  Secondary Discharge Diagnosis:  1. CAD     status post MI treated with DES to the LAD with staged PCI/DES to the circumflex in 2016 2. CKD (III) 3. GERD 4. HTN 5. HCM 6. NICM   Allergies  Allergen Reactions  . Nsaids     REACTION: nsaid induced nephropathy  . Ibuprofen Other (See Comments)    Kidney damage     Procedures This Admission: 1. 03/13/18: TEE     Impressions: - Severe global reduction in LV systolic function; severe LVH;   moderate MR; moderate LAE; mild RVE with severe RV dysfunction;   mild RAE; mild TR.  2.  Electrophysiology study and radiofrequency catheter ablation on 03/13/18 by Dr Lovena Le.   CONCLUSIONS:  1. Isthmus-dependent right atrial flutter upon presentation.  2. Successful radiofrequency ablation of atrial flutter along the cavotricuspid isthmus with complete bidirectional isthmus block achieved using 3D electroanatomical mapping with CARTO. 3. No inducible arrhythmias following ablation.  4. No early apparent complications.   Brief HPI: Paul [name] is a 57 y.o. male with a past medical history as outlined above. He has hx of recurrent rapid atrial flutter, poorly tolerated.  Risks, benefits, and alternatives to ablation were reviewed with the patient who wished to proceed.   Hospital Course:  The patient was admitted and underwent TEE, noting LVEF 25-30%, known LVH, his reduced LVEF suspected to be 2/2 his tachycardia, he then underwent EPS/RFCA with details as outlined above. He was monitored on telemetry overnight which demonstrated SR, NSVT episodes.   Groin site was without complication.  He reports a pleuritic type chest pain, Dr. Lovena Le discussed at length with the patient, narcotic is not going to be an effective orindicated treatment, given renal function will avoid NSAIDs, he has Tramadol at home, typical post ablation pleuritic pain should self resolve and patient educated on this by Dr. Lovena Le, he examined the patient and considered him stable for discharge to home.  Follow up will is arranged in 4 weeks.  Wound care and restrictions were reviewed with the patient, as well as instructions to continue his home medicine regime as usual including his Eliquis uninterrupted, also discussed low sodium diet with him prior to discharge.   Physical Exam: Vitals:   03/13/18 1700 03/13/18 1705 03/13/18 1740 03/14/18 0417  BP: 96/71 (!) 87/57 (!) 89/62 110/82  Pulse:    74  Resp: (!) 22 (!) 23 (!) 22 18  Temp:    98 F (36.7 C)  TempSrc:    Oral  SpO2:    97%  Weight:    182 lb 6.4 oz (82.7 kg)    GEN- The patient is well appearing, alert and oriented x 3 today.   HEENT: normocephalic, atraumatic; sclera clear, conjunctiva pink; hearing intact; oropharynx clear; neck supple, no JVP Lymph- no cervical lymphadenopathy Lungs- CTA b/l, normal work of breathing.  No wheezes, rales, rhonchi Heart-RRR, no murmurs, rubs or gallops, PMI not laterally displaced GI- soft, non-tender Extremities- no clubbing, cyanosis, or edema; R  groin without hematoma/bruit MS- no significant deformity or atrophy Skin- warm and dry, no rash or lesion Psych- euthymic mood, full affect Neuro-  strength and sensation are intact   Labs:   Lab Results  Component Value Date   WBC 5.6 03/10/2018   HGB 11.0 (L) 03/10/2018   HCT 35.9 (L) 03/10/2018   MCV 81 03/10/2018   PLT 203 03/10/2018    Recent Labs  Lab 03/08/18 0904  NA 144  K 4.9  CL 111*  CO2 19*  BUN 34*  CREATININE 2.56*  CALCIUM 8.8  GLUCOSE 94    Discharge Medications:  Allergies as of  03/14/2018      Reactions   Nsaids    REACTION: nsaid induced nephropathy   Ibuprofen Other (See Comments)   Kidney damage      Medication List    TAKE these medications   apixaban 5 MG Tabs tablet Commonly known as:  ELIQUIS Take 1 tablet (5 mg total) by mouth 2 (two) times daily.   carvedilol 6.25 MG tablet Commonly known as:  COREG Take 1 tablet (6.25 mg total) by mouth 2 (two) times daily with a meal.   ferrous sulfate 325 (65 FE) MG tablet Take 1 tablet (325 mg total) by mouth 3 (three) times daily with meals. Take with stool softener.   furosemide 20 MG tablet Commonly known as:  LASIX Take 1 tablet daily as needed for weight gain of more than 2 lbs a day or 5 lbs in a week.   pantoprazole 40 MG tablet Commonly known as:  PROTONIX Take 1 tablet (40 mg total) by mouth daily at 12 noon.   sildenafil 100 MG tablet Commonly known as:  VIAGRA Take 1 tablet (100 mg total) by mouth daily as needed for erectile dysfunction. 30 min-4 hrs before sexual activity. Do not use with nitro.   traMADol 50 MG tablet Commonly known as:  ULTRAM Take 1-2 tablets (50-100 mg total) by mouth every 12 (twelve) hours as needed for severe pain.       Disposition:  Home Discharge Instructions    Diet - low sodium heart healthy   Complete by:  As directed    Increase activity slowly   Complete by:  As directed      Follow-up Information    Evans Lance, MD Follow up on 04/11/2018.   Specialty:  Cardiology Why:  4:15PM Contact information: [address] [address] [phone]           Duration of Discharge Encounter: Greater than 30 minutes including physician time.  Venetia Night, PA-C 03/14/2018 8:54 AM   EP attending  Patient seen and examined.  Agree with the findings as noted above.  He is maintaining sinus rhythm very nicely status post catheter ablation of atrial flutter.  He will be discharged home with usual follow-up.  He  will continue his heart failure medications, maintain a low-sodium diet, continue his oral anticoagulation for at least 3 weeks, and try and stay active as he is able.  Cristopher Peru, MD

## 2018-03-14 NOTE — Care Management CC44 (Signed)
Condition Code 44 Documentation Completed  Patient Details  Name: Paul [name] MRN: [id number] Date of Birth: [birth year]   Condition Code 44 given:  Yes Patient signature on Condition Code 44 notice:  (patient refused to sign) Documentation of 2 MD's agreement:  Yes Code 44 added to claim:  Yes    Royston Bake, RN 03/14/2018, 10:21 AM

## 2018-03-14 NOTE — Care Management Obs Status (Signed)
[facility] NOTIFICATION   Patient Details  Name: Paul [name] MRN: [id number] Date of Birth: 1961/06/19   Medicare Observation Status Notification Given:  Yes    Royston Bake, RN 03/14/2018, 10:21 AM

## 2018-04-11 ENCOUNTER — Ambulatory Visit: Payer: Medicare HMO | Admitting: Internal Medicine

## 2018-04-28 ENCOUNTER — Other Ambulatory Visit: Payer: Self-pay | Admitting: Internal Medicine

## 2018-05-04 ENCOUNTER — Telehealth: Payer: Self-pay | Admitting: Internal Medicine

## 2018-05-04 ENCOUNTER — Other Ambulatory Visit: Payer: Self-pay | Admitting: *Deleted

## 2018-05-04 DIAGNOSIS — N529 Male erectile dysfunction, unspecified: Secondary | ICD-10-CM

## 2018-05-04 NOTE — Telephone Encounter (Signed)
Pt states that he get this from patient assistance thru the company.  He says that Dr. Ola Spurr knows the fax number.  [place], Salome Spotted, CMA

## 2018-05-04 NOTE — Telephone Encounter (Signed)
Patient is requesting viagra. Had previously asked him to get clearance from cardiology given low ejection fraction and evidence of HOCM on most recent ECHOs. He has had aflutter ablation in the interim but has not had repeat ECHO. Patient states he will get their opinion.   Olene Floss, MD [place], PGY-3

## 2018-05-18 NOTE — Progress Notes (Deleted)
Electrophysiology Office Note Date: 05/18/2018  ID:  Paul [name], DOB 1961-04-14, MRN [id number]  PCP: [name], [name], DO  Cardiologist: Irish Lack Electrophysiologist: Lovena Le  CC: atrial flutter follow up  Paul [name] is a 57 y.o. male seen today for Dr Lovena Le.  He presents today for routine electrophysiology followup.  Since last being seen in our clinic, the patient reports doing very well.  He denies chest pain, palpitations, dyspnea, PND, orthopnea, nausea, vomiting, dizziness, syncope, edema, weight gain, or early satiety.  Past Medical History:  Diagnosis Date  . Arthritis   . CAD (coronary artery disease)    a. 03/2015 NSTEMI/PCI: LM nl, LAD 95p (3.5x20 Promus DES), D1 60, D2/3 mild dzs, LCX 55m (plan for staged pci), OM1/2 min irregs, RCA 20p, RPDA 60.  Marland Kitchen CKD (chronic kidney disease), stage III ([place])   . GERD (gastroesophageal reflux disease)    occ  . HOCM (hypertrophic obstructive cardiomyopathy) ([place])    a. 03/2015 Echo: EF 65-60%, sev LV thickening w/ bright, speckled appearance of IV septum, Gr 2 DD, AoV sclerosis, triv AI, mild MR, LA 11ml/m^2, RA 42ml/m^2, aneurysmal IAS, mod TR, PASP 103mmHg.  Marland Kitchen Hypertension    denies  . PAF (paroxysmal atrial fibrillation) ([place])    remote isolated episode - 09/2012.  . TMJ arthralgia    Past Surgical History:  Procedure Laterality Date  . A-FLUTTER ABLATION N/A 03/13/2018   Procedure: A-FLUTTER ABLATION;  Surgeon: Evans Lance, MD;  Location: [place] [facility];  Service: Cardiovascular;  Laterality: N/A;  . APPENDECTOMY    . CARDIAC CATHETERIZATION N/A 03/19/2015   Procedure: Left Heart Cath and Coronary Angiography;  Surgeon: [name], MD;  Location: [place] [facility];  Service: Cardiovascular;  Laterality: N/A;  . CARDIAC CATHETERIZATION  03/19/2015   Procedure: Coronary Stent Intervention;  Surgeon: [name], MD;  Location: [place] [facility];  Service: Cardiovascular;;  . CARDIAC  CATHETERIZATION N/A 04/09/2015   Procedure: Coronary Stent Intervention;  Surgeon: [name], MD;  Location: [place] [facility];  Service: Cardiovascular;  Laterality: N/A;  . CARDIOVERSION N/A 02/15/2018   Procedure: CARDIOVERSION;  Surgeon: [name], MD;  Location: [place];  Service: Cardiovascular;  Laterality: N/A;  . HIP SURGERY Left 74   lft   . LAPAROSCOPIC APPENDECTOMY  09/22/2012   Procedure: APPENDECTOMY LAPAROSCOPIC;  Surgeon: Harl Bowie, MD;  [facility];  Service: General;;  . LAPAROSCOPY  09/22/2012   Procedure: LAPAROSCOPY DIAGNOSTIC;  Surgeon: Harl Bowie, MD;  [facility];  Service: General;  Laterality: N/A;  . TEE WITHOUT CARDIOVERSION N/A 02/15/2018   Procedure: TRANSESOPHAGEAL ECHOCARDIOGRAM (TEE);  Surgeon: [name], MD;  Location: [place];  Service: Cardiovascular;  Laterality: N/A;  . TEE WITHOUT CARDIOVERSION N/A 03/13/2018   Procedure: TRANSESOPHAGEAL ECHOCARDIOGRAM (TEE);  Surgeon: Lelon Perla, MD;  [facility];  Service: Cardiovascular;  Laterality: N/A;  . TOTAL HIP ARTHROPLASTY Right 03,01   x2  . TOTAL HIP ARTHROPLASTY WITH HARDWARE REMOVAL Left 08/05/2014   Procedure: TOTAL HIP ARTHROPLASTY WITH HARDWARE REMOVAL;  Surgeon: Kerin Salen, MD;  Location: [place];  Service: Orthopedics;  Laterality: Left;    Current Outpatient Medications  Medication Sig Dispense Refill  . apixaban (ELIQUIS) 5 MG TABS tablet Take 1 tablet (5 mg total) by mouth 2 (two) times daily. 60 tablet 0  . carvedilol (COREG) 6.25 MG tablet Take 1 tablet (6.25 mg total) by mouth 2 (two) times  daily with a meal. 180 tablet 3  . ferrous sulfate 325 (65 FE) MG tablet Take 1 tablet (325 mg total) by mouth 3 (three) times daily with meals. Take with stool softener. 90 tablet 0  . furosemide (LASIX) 20 MG tablet Take 1 tablet daily as needed for weight gain of more than 2 lbs a day or 5 lbs in a week. 30 tablet 3  . pantoprazole  (PROTONIX) 40 MG tablet Take 1 tablet (40 mg total) by mouth daily at 12 noon. 30 tablet 0  . sildenafil (VIAGRA) 100 MG tablet Take 1 tablet (100 mg total) by mouth daily as needed for erectile dysfunction. 30 min-4 hrs before sexual activity. Do not use with nitro. 90 tablet 0  . traMADol (ULTRAM) 50 MG tablet Take 1 tablet (50 mg total) by mouth every 12 (twelve) hours as needed for severe pain. 30 tablet 0   No current facility-administered medications for this visit.     Allergies:   Nsaids and Ibuprofen   Social History: Social History   Socioeconomic History  . Marital status: Divorced    Spouse name: Not on file  . Number of children: Not on file  . Years of education: Not on file  . Highest education level: Not on file  Occupational History  . Not on file  Social Needs  . Financial resource strain: Not on file  . Food insecurity:    Worry: Patient refused    Inability: Patient refused  . Transportation needs:    Medical: Patient refused    Non-medical: Patient refused  Tobacco Use  . Smoking status: Light Tobacco Smoker    Packs/day: 0.25    Years: 10.00    Pack years: 2.50    Types: Cigarettes  . Smokeless tobacco: Never Used  Substance and Sexual Activity  . Alcohol use: Yes    Alcohol/week: 1.2 oz    Types: 2 Cans of beer per week    Comment: occasional  . Drug use: No  . Sexual activity: Not Currently  Lifestyle  . Physical activity:    Days per week: Patient refused    Minutes per session: Patient refused  . Stress: Not on file  Relationships  . Social connections:    Talks on phone: Patient refused    Gets together: Patient refused    Attends religious service: Patient refused    Active member of club or organization: Patient refused    Attends meetings of clubs or organizations: Patient refused    Relationship status: Patient refused  . Intimate partner violence:    Fear of current or ex partner: Patient refused    Emotionally abused: Patient  refused    Physically abused: Patient refused    Forced sexual activity: Patient refused  Other Topics Concern  . Not on file  Social History Narrative  . Not on file    Family History: Family History  Problem Relation Age of Onset  . Heart disease Mother   . Heart disease Father     Review of Systems: All other systems reviewed and are otherwise negative except as noted above.   Physical Exam: VS:  There were no vitals taken for this visit. , BMI There is no height or weight on file to calculate BMI. Wt Readings from Last 3 Encounters:  03/14/18 182 lb 6.4 oz (82.7 kg)  03/10/18 188 lb 9.6 oz (85.5 kg)  03/09/18 187 lb (84.8 kg)    GEN- The patient is well appearing,  alert and oriented x 3 today.   HEENT: normocephalic, atraumatic; sclera clear, conjunctiva pink; hearing intact; oropharynx clear; neck supple, no JVP Lymph- no cervical lymphadenopathy Lungs- Clear to ausculation bilaterally, normal work of breathing.  No wheezes, rales, rhonchi Heart- Regular rate and rhythm, no murmurs, rubs or gallops, PMI not laterally displaced GI- soft, non-tender, non-distended, bowel sounds present, no hepatosplenomegaly Extremities- no clubbing, cyanosis, or edema; DP/PT/radial pulses 2+ bilaterally MS- no significant deformity or atrophy Skin- warm and dry, no rash or lesion  Psych- euthymic mood, full affect Neuro- strength and sensation are intact   EKG:  EKG is ordered today. The ekg ordered today shows ***  Recent Labs: 02/09/2018: B Natriuretic Peptide 1,737.7; TSH 0.618 02/11/2018: Magnesium 1.9 02/18/2018: ALT 358 03/08/2018: BUN 34; Creatinine, Ser 2.56; NT-Pro BNP 13,398; Potassium 4.9; Sodium 144 03/10/2018: Hemoglobin 11.0; Platelets 203    Other studies Reviewed: Additional studies/ records that were reviewed today include: hospital records  Assessment and Plan: 1.  Atrial flutter Doing well post ablation No clinical recurrence ***Eliquis  2.  Presumed  tachycardia mediated cardiomyopathy Continue current meds Repeat echo 06/2018  3.  CAD No recent ischemic symptoms Continue current therapy  4.  HTN Stable No change required today   Current medicines are reviewed at length with the patient today.   The patient does not have concerns regarding his medicines.  The following changes were made today:  none  Labs/ tests ordered today include: none No orders of the defined types were placed in this encounter.    Disposition:   Follow up with Dr Lovena Le in 3 months after updated echo     Signed, Chanetta Marshall, NP 05/18/2018 10:49 AM   [place] [address] [phone] (office) [phone] (fax)

## 2018-05-24 ENCOUNTER — Ambulatory Visit: Payer: Medicare HMO | Admitting: Nurse Practitioner

## 2018-07-04 NOTE — Progress Notes (Deleted)
Electrophysiology Office Note Date: 07/04/2018  ID:  [name], DOB 08/16/1961, MRN [id number]  PCP: [name], [name], DO  Cardiologist: Irish Lack Electrophysiologist: Lovena Le  CC: flutter ablation follow up  Paul [name] is a 57 y.o. male seen today for Dr Lovena Le.  He presents today for routine electrophysiology followup.  Since last being seen in our clinic, the patient reports doing very well.  He denies chest pain, palpitations, dyspnea, PND, orthopnea, nausea, vomiting, dizziness, syncope, edema, weight gain, or early satiety.  Past Medical History:  Diagnosis Date  . Arthritis   . CAD (coronary artery disease)    a. 03/2015 NSTEMI/PCI: LM nl, LAD 95p (3.5x20 Promus DES), D1 60, D2/3 mild dzs, LCX 34m (plan for staged pci), OM1/2 min irregs, RCA 20p, RPDA 60.  Marland Kitchen CKD (chronic kidney disease), stage III ([place])   . GERD (gastroesophageal reflux disease)    occ  . HOCM (hypertrophic obstructive cardiomyopathy) ([place])    a. 03/2015 Echo: EF 65-60%, sev LV thickening w/ bright, speckled appearance of IV septum, Gr 2 DD, AoV sclerosis, triv AI, mild MR, LA 28ml/m^2, RA 40ml/m^2, aneurysmal IAS, mod TR, PASP 20mmHg.  Marland Kitchen Hypertension    denies  . PAF (paroxysmal atrial fibrillation) ([place])    remote isolated episode - 09/2012.  . TMJ arthralgia    Past Surgical History:  Procedure Laterality Date  . A-FLUTTER ABLATION N/A 03/13/2018   Procedure: A-FLUTTER ABLATION;  Surgeon: Evans Lance, MD;  Location: [place] [facility];  Service: Cardiovascular;  Laterality: N/A;  . APPENDECTOMY    . CARDIAC CATHETERIZATION N/A 03/19/2015   Procedure: Left Heart Cath and Coronary Angiography;  Surgeon: [name], MD;  Location: [place] [facility];  Service: Cardiovascular;  Laterality: N/A;  . CARDIAC CATHETERIZATION  03/19/2015   Procedure: Coronary Stent Intervention;  Surgeon: [name], MD;  Location: [place] [facility];  Service: Cardiovascular;;  . CARDIAC  CATHETERIZATION N/A 04/09/2015   Procedure: Coronary Stent Intervention;  Surgeon: [name], MD;  Location: [place] [facility];  Service: Cardiovascular;  Laterality: N/A;  . CARDIOVERSION N/A 02/15/2018   Procedure: CARDIOVERSION;  Surgeon: [name], MD;  Location: [facility] [facility];  Service: Cardiovascular;  Laterality: N/A;  . HIP SURGERY Left 74   lft   . LAPAROSCOPIC APPENDECTOMY  09/22/2012   Procedure: APPENDECTOMY LAPAROSCOPIC;  Surgeon: Harl Bowie, MD;  [facility];  Service: General;;  . LAPAROSCOPY  09/22/2012   Procedure: LAPAROSCOPY DIAGNOSTIC;  Surgeon: Harl Bowie, MD;  [facility];  Service: General;  Laterality: N/A;  . TEE WITHOUT CARDIOVERSION N/A 02/15/2018   Procedure: TRANSESOPHAGEAL ECHOCARDIOGRAM (TEE);  Surgeon: [name], MD;  Location: [place];  Service: Cardiovascular;  Laterality: N/A;  . TEE WITHOUT CARDIOVERSION N/A 03/13/2018   Procedure: TRANSESOPHAGEAL ECHOCARDIOGRAM (TEE);  Surgeon: Lelon Perla, MD;  Location: [facility];  Service: Cardiovascular;  Laterality: N/A;  . TOTAL HIP ARTHROPLASTY Right 03,01   x2  . TOTAL HIP ARTHROPLASTY WITH HARDWARE REMOVAL Left 08/05/2014   Procedure: TOTAL HIP ARTHROPLASTY WITH HARDWARE REMOVAL;  Surgeon: Kerin Salen, MD;  Location: [place];  Service: Orthopedics;  Laterality: Left;    Current Outpatient Medications  Medication Sig Dispense Refill  . apixaban (ELIQUIS) 5 MG TABS tablet Take 1 tablet (5 mg total) by mouth 2 (two) times daily. 60 tablet 0  . carvedilol (COREG) 6.25 MG tablet Take 1 tablet (6.25 mg total) by mouth 2 (two) times  daily with a meal. 180 tablet 3  . ferrous sulfate 325 (65 FE) MG tablet Take 1 tablet (325 mg total) by mouth 3 (three) times daily with meals. Take with stool softener. 90 tablet 0  . furosemide (LASIX) 20 MG tablet Take 1 tablet daily as needed for weight gain of more than 2 lbs a day or 5 lbs in a week. 30 tablet 3  . pantoprazole  (PROTONIX) 40 MG tablet Take 1 tablet (40 mg total) by mouth daily at 12 noon. 30 tablet 0  . sildenafil (VIAGRA) 100 MG tablet Take 1 tablet (100 mg total) by mouth daily as needed for erectile dysfunction. 30 min-4 hrs before sexual activity. Do not use with nitro. 90 tablet 0  . traMADol (ULTRAM) 50 MG tablet Take 1 tablet (50 mg total) by mouth every 12 (twelve) hours as needed for severe pain. 30 tablet 0   No current facility-administered medications for this visit.     Allergies:   Nsaids and Ibuprofen   Social History: Social History   Socioeconomic History  . Marital status: Divorced    Spouse name: Not on file  . Number of children: Not on file  . Years of education: Not on file  . Highest education level: Not on file  Occupational History  . Not on file  Social Needs  . Financial resource strain: Not on file  . Food insecurity:    Worry: Patient refused    Inability: Patient refused  . Transportation needs:    Medical: Patient refused    Non-medical: Patient refused  Tobacco Use  . Smoking status: Light Tobacco Smoker    Packs/day: 0.25    Years: 10.00    Pack years: 2.50    Types: Cigarettes  . Smokeless tobacco: Never Used  Substance and Sexual Activity  . Alcohol use: Yes    Alcohol/week: 2.0 standard drinks    Types: 2 Cans of beer per week    Comment: occasional  . Drug use: No  . Sexual activity: Not Currently  Lifestyle  . Physical activity:    Days per week: Patient refused    Minutes per session: Patient refused  . Stress: Not on file  Relationships  . Social connections:    Talks on phone: Patient refused    Gets together: Patient refused    Attends religious service: Patient refused    Active member of club or organization: Patient refused    Attends meetings of clubs or organizations: Patient refused    Relationship status: Patient refused  . Intimate partner violence:    Fear of current or ex partner: Patient refused    Emotionally  abused: Patient refused    Physically abused: Patient refused    Forced sexual activity: Patient refused  Other Topics Concern  . Not on file  Social History Narrative  . Not on file    Family History: Family History  Problem Relation Age of Onset  . Heart disease Mother   . Heart disease Father     Review of Systems: All other systems reviewed and are otherwise negative except as noted above.   Physical Exam: VS:  There were no vitals taken for this visit. , BMI There is no height or weight on file to calculate BMI. Wt Readings from Last 3 Encounters:  03/14/18 182 lb 6.4 oz (82.7 kg)  03/10/18 188 lb 9.6 oz (85.5 kg)  03/09/18 187 lb (84.8 kg)    GEN- The patient is well  appearing, alert and oriented x 3 today.   HEENT: normocephalic, atraumatic; sclera clear, conjunctiva pink; hearing intact; oropharynx clear; neck supple, no JVP Lymph- no cervical lymphadenopathy Lungs- Clear to ausculation bilaterally, normal work of breathing.  No wheezes, rales, rhonchi Heart- Regular rate and rhythm, no murmurs, rubs or gallops, PMI not laterally displaced GI- soft, non-tender, non-distended, bowel sounds present, no hepatosplenomegaly Extremities- no clubbing, cyanosis, or edema; DP/PT/radial pulses 2+ bilaterally MS- no significant deformity or atrophy Skin- warm and dry, no rash or lesion  Psych- euthymic mood, full affect Neuro- strength and sensation are intact   EKG:  EKG is ordered today. The ekg ordered today shows ***  Recent Labs: 02/09/2018: B Natriuretic Peptide 1,737.7; TSH 0.618 02/11/2018: Magnesium 1.9 02/18/2018: ALT 358 03/08/2018: BUN 34; Creatinine, Ser 2.56; NT-Pro BNP 13,398; Potassium 4.9; Sodium 144 03/10/2018: Hemoglobin 11.0; Platelets 203    Other studies Reviewed: Additional studies/ records that were reviewed today include: hospital records  Assessment and Plan:  1.  Typical atrial flutter Doing well s/p ablation without procedure related  complications He has had prior AF in 2013 and severe biatrial enlargement by echo 02/2018 - will continue Eliquis for now.  He did not have awareness of his atrial flutter and presented to the hospital with acute heart failure. Consider ILR if ICD not pursued (see below)  2.  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 Unable to do cardiac MRI with renal dysfunction, could consider fat pad biopsy in the future - will defer to Dr Irish Lack. If EF remains depressed despite maintaining SR and medical compliance, consider ICD implant (would plan for device that would allow for monitoring for AF). He has a narrow QRS, would not anticipate benefit with CRT.   3.  HTN Stable No change required today  4.  CAD No recent ischemic symptoms Continue medical therapy  5.  CKD, stage III BMET today    Current medicines are reviewed at length with the patient today.   The patient does not have concerns regarding his medicines.  The following changes were made today:  none  Labs/ tests ordered today include: none No orders of the defined types were placed in this encounter.    Disposition:   Follow up with Dr Irish Lack 3-4 weeks     Signed, Chanetta Marshall, NP 07/04/2018 7:28 PM   [place] [address] [place] [address] [phone] (office) [phone] (fax)

## 2018-07-05 ENCOUNTER — Telehealth: Payer: Self-pay | Admitting: Internal Medicine

## 2018-07-05 NOTE — Telephone Encounter (Signed)
New Message      Patient is needing a Letter stating that it is ok to refill Viagra per his primary doctor "Dr. Rozanna Boer" [name]. Pls advise.

## 2018-07-06 ENCOUNTER — Ambulatory Visit: Payer: Medicare HMO | Admitting: Nurse Practitioner

## 2018-07-11 NOTE — Telephone Encounter (Signed)
Left detailed message for Pt notifying letter was available to be picked up.

## 2018-07-12 ENCOUNTER — Other Ambulatory Visit: Payer: Self-pay | Admitting: Internal Medicine

## 2018-07-12 NOTE — Telephone Encounter (Signed)
° ° °  Patient requesting letter be faxed as well.  Fax [phone] [name]- Dr Ola Spurr

## 2018-07-13 NOTE — Telephone Encounter (Signed)
Patient will need appointment before I will refill this medication. Was to follow up in April and has not. Please schedule follow up before refill. Thank you.

## 2018-07-20 NOTE — Telephone Encounter (Signed)
To blue pool.

## 2018-07-20 NOTE — Telephone Encounter (Signed)
I will discuss continuing medication with patient at follow up appointment on 9/16.

## 2018-07-20 NOTE — Telephone Encounter (Signed)
Second request received. Patient has an appt scheduled for 07/24/18.  Danley Danker, RN [facility] RN)

## 2018-07-24 ENCOUNTER — Ambulatory Visit: Payer: Medicare HMO | Admitting: Family Medicine

## 2018-08-25 ENCOUNTER — Ambulatory Visit: Payer: Medicare HMO | Admitting: Family Medicine

## 2018-09-12 ENCOUNTER — Ambulatory Visit (INDEPENDENT_AMBULATORY_CARE_PROVIDER_SITE_OTHER): Payer: Medicare HMO | Admitting: Family Medicine

## 2018-09-12 ENCOUNTER — Encounter: Payer: Self-pay | Admitting: Family Medicine

## 2018-09-12 ENCOUNTER — Other Ambulatory Visit: Payer: Self-pay

## 2018-09-12 VITALS — BP 122/60 | HR 65 | Temp 98.5°F | Ht 72.0 in | Wt 181.0 lb

## 2018-09-12 DIAGNOSIS — Z23 Encounter for immunization: Secondary | ICD-10-CM

## 2018-09-12 DIAGNOSIS — M25551 Pain in right hip: Secondary | ICD-10-CM | POA: Diagnosis not present

## 2018-09-12 DIAGNOSIS — I10 Essential (primary) hypertension: Secondary | ICD-10-CM | POA: Diagnosis not present

## 2018-09-12 DIAGNOSIS — Z9861 Coronary angioplasty status: Secondary | ICD-10-CM

## 2018-09-12 DIAGNOSIS — F528 Other sexual dysfunction not due to a substance or known physiological condition: Secondary | ICD-10-CM | POA: Diagnosis not present

## 2018-09-12 DIAGNOSIS — N184 Chronic kidney disease, stage 4 (severe): Secondary | ICD-10-CM

## 2018-09-12 DIAGNOSIS — M25552 Pain in left hip: Secondary | ICD-10-CM

## 2018-09-12 DIAGNOSIS — I2510 Atherosclerotic heart disease of native coronary artery without angina pectoris: Secondary | ICD-10-CM

## 2018-09-12 MED ORDER — ASPIRIN 81 MG PO TABS: 81.0000 mg | ORAL_TABLET | Freq: Every day | ORAL | 3 refills | Status: AC

## 2018-09-12 MED ORDER — DICLOFENAC SODIUM 1 % TD GEL: 4.0000 g | Freq: Four times a day (QID) | TRANSDERMAL | 2 refills | Status: DC

## 2018-09-12 MED ORDER — TRAMADOL HCL 50 MG PO TABS: 50.0000 mg | ORAL_TABLET | Freq: Two times a day (BID) | ORAL | 0 refills | Status: DC | PRN

## 2018-09-12 NOTE — Patient Instructions (Addendum)
Thank you for coming to see me today. It was a pleasure! Today we talked about:   We will call you with your lab results.   Please use diclofenac gel for your knee pain, continue to use tylenol and I have refilled your tramadol.   Please take the aspirin daily. It is important to protect your heart. I also recommend taking atorvastatin and coreg for your heart, but we can discuss this further later and you should discuss this with your heart doctor.   Schedule an appointment with your kidney doctor.   Please follow-up with me in 6 weeks or sooner as needed.  If you have any questions or concerns, please do not hesitate to call the office at [phone].  Take Care,   [name] Jalaysia Lobb, DO

## 2018-09-12 NOTE — Progress Notes (Addendum)
Subjective:    Patient ID: Paul [name], male    DOB: 10-10-1961, 57 y.o.   MRN: [id number]   CC: meet PCP, hip pain, ED, HTN, CAD  HPI:  CAD  s/p NSTEMI with stent placement  hypertensive cardiomyopathy  Diastolic CHF: Patient follows with cardiology outpatient.  Patient recently had cardiac ablation for a flutter.  Patient has follow-up with cardiology on 11/19.  Patient reports that he has not been taking his Coreg, aspirin atorvastatin or Eliquis.  Patient not a candidate for ACE or ARB given kidney disease.  Patient reports that he believes that he is healthy and knows his body and he does not need these medications. Upon chart review it appears that patient also has history of cocaine use which he did not disclose to me during this visit.  Per chart review it appears that last cocaine use was in November 2018.  Hip Pain: Swelling at times. Worse if on it. No fevers. Has limited his ability to walk. Cannot bend over to pick up grandkids. His pain is worse than it usually is.  Patient reports history of bilateral hip replacements.  Reports that he has been out of his tramadol and has been unable to do anything he would like to do in life. Used to be Print production planner.   Erectile Dysfunction: Patient has letter from cardiologist which states that he is safe to take sildenafil.  Patient reports that he needs a form to be signed by me in order to get this from [place].  Patient reports that he will bring the form and so that I may fax it to them in order for him to get sildenafil.  States that he has used this in the past.  Smoking status reviewed  ROS: 10 point ROS is otherwise negative, except as mentioned in HPI  Patient Active Problem List   Diagnosis Date Noted  . Stented coronary artery     Priority: High  . Hypertensive cardiomyopathy (HCC) 09/24/2012    Priority: High  . TOBACCO USE 03/31/2007    Priority: High  . Arthritis of left hip 08/05/2014    Priority: Medium    . Hyperlipidemia 05/05/2007    Priority: Medium  . ERECTILE DYSFUNCTION 04/23/2008    Priority: Low  . GERD 02/21/2008    Priority: Low  . Pacemaker infection (HCC) 03/13/2018  . Atrial flutter with rapid ventricular response (HCC) 03/13/2018  . Chronic atrial fibrillation 02/27/2018  . History of cocaine use 02/18/2018  . Elevated LFTs   . CHF exacerbation (HCC) 02/09/2018  . Typical atrial flutter (HCC) 02/09/2018  . Chronic kidney disease (CKD), stage IV (severe) (HCC) 07/17/2017  . Chronic pain syndrome 05/31/2016  . Osteoarthritis, knee 05/31/2016  . CAD - S/P LAD DES May 2016, staged CFX DES June 2016 04/08/2015  . Chronic combined systolic and diastolic CHF (congestive heart failure) (HCC) 03/27/2015  . Microcytic anemia 03/19/2015  . 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 NSTEMI (non-ST elevated myocardial infarction) (HCC) 03/18/2015  . Acute diastolic CHF (congestive heart failure), NYHA class 4 (HCC) 03/18/2015    Class: Acute  . Essential hypertension 03/18/2015  . Lumbar strain 11/12/2014  . 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 Sinus tachycardia 02/06/2013  . S/P hip replacement 12/25/2012  . 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 Bilateral hip pain 09/05/2012  . Other, mixed, or unspecified nondependent drug abuse, unspecified 12/03/2008  . Hypertrophic obstructive  cardiomyopathy ([place]) 08/07/2008  . PAF- last in 2013 02/17/2008  . BACK PAIN 10/11/2007  . Renal failure 01/05/2007     Objective:  BP 122/60   Pulse 65   Temp 98.5 F (36.9 C) (Oral)   Ht 6' (1.829 m)   Wt 181 lb (82.1 kg)   SpO2 97%   BMI 24.55 kg/m  Vitals and nursing note reviewed  General: NAD, pleasant Cardiac: RRR, normal heart sounds, no murmurs Respiratory: CTAB, normal effort Abdomen: soft, nontender, nondistended Extremities: no edema or cyanosis. WWP. Skin: warm and dry, no rashes noted Neuro:  alert and oriented, no focal deficits Psych: normal affect  Assessment & Plan:    Bilateral hip pain Patient reports that being out of his tramadol has made his pain increasingly worse.  Patient unable to pick up his grandkids.  Patient reports that he has been using Tylenol and diclofenac gel.  We will refill diclofenac gel today and encouraged to continue use of Tylenol.  Patient given tramadol for use for 2 times per day.  Reviewed PMP aware which shows that patient is appropriately picking up tramadol.  Has not received prescription since 05/03/2018.  Will refill today in hopes that patient will continue to come to his appointments, although informed patient that this is also a narcotic and this is not a good long-term answer to his problems. Patient has not taken Percocet in 1 year.  Essential hypertension Patient with history of essential hypertension currently not taking any antihypertensive medications.  BP well controlled today at 122/60.  ERECTILE DYSFUNCTION Given clearance by cardiologist will send in patient sildenafil after patient drops off form for me to fax back to [facility] per his request.  Chronic kidney disease (CKD), stage IV (severe) ([place]) Patient follows with [facility] reports that he has an appointment with his doctor in May.  BMP obtained in office today shows creatinine 1.94 and GFR of 43 which appears to be around patient's baseline.  Patient counseled on avoiding nephrotoxic agents such as NSAIDs and encouraged to keep blood pressure under control.  Well-controlled in office today.  CAD - S/P LAD DES May 2016, staged CFX DES June 2016 Patient counseled today on risks of patient not being compliant with carvedilol, statin therapy, and aspirin.  Patient reports that he believes that he does not need any of these medications.  Upon extensive counseling patient did agree to start taking baby aspirin daily and sent refill into pharmacy.  Patient also to discuss these  medications with his cardiologist given that he has history of stent placement and extensive CAD.    Patient does have follow-up with cardiologist 09/26/2018.   [name] Selenne Coggin, DO Family Medicine Resident PGY-2

## 2018-09-13 LAB — BASIC METABOLIC PANEL
BUN/Creatinine Ratio: 14 (ref 9–20)
BUN: 28 mg/dL — AB (ref 6–24)
CALCIUM: 9.4 mg/dL (ref 8.7–10.2)
CO2: 22 mmol/L (ref 20–29)
Chloride: 107 mmol/L — ABNORMAL HIGH (ref 96–106)
Creatinine, Ser: 1.94 mg/dL — ABNORMAL HIGH (ref 0.76–1.27)
GFR calc Af Amer: 43 mL/min/{1.73_m2} — ABNORMAL LOW (ref >59)
GFR, EST NON AFRICAN AMERICAN: 37 mL/min/{1.73_m2} — AB (ref >59)
Glucose: 89 mg/dL (ref 65–99)
Potassium: 5.1 mmol/L (ref 3.5–5.2)
Sodium: 143 mmol/L (ref 134–144)

## 2018-09-19 ENCOUNTER — Encounter: Payer: Self-pay | Admitting: Family Medicine

## 2018-09-19 NOTE — Assessment & Plan Note (Addendum)
Given clearance by cardiologist will send in patient sildenafil after patient drops off form for me to fax back to [place] per his request.

## 2018-09-19 NOTE — Assessment & Plan Note (Signed)
Patient reports that being out of his tramadol has made his pain increasingly worse.  Patient unable to pick up his grandkids.  Patient reports that he has been using Tylenol and diclofenac gel.  We will refill diclofenac gel today and encouraged to continue use of Tylenol.  Patient given tramadol for use for 2 times per day.  Reviewed PMP aware which shows that patient is appropriately picking up tramadol.  Has not received prescription since 05/03/2018.  Will refill today in hopes that patient will continue to come to his appointments, although informed patient that this is also a narcotic and this is not a good long-term answer to his problems. Patient has not taken Percocet in 1 year.

## 2018-09-19 NOTE — Assessment & Plan Note (Addendum)
Patient follows with [facility] reports that he has an appointment with his doctor in May.  BMP obtained in office today shows creatinine 1.94 and GFR of 43 which appears to be around patient's baseline.  Patient counseled on avoiding nephrotoxic agents such as NSAIDs and encouraged to keep blood pressure under control.  Well-controlled in office today.

## 2018-09-19 NOTE — Assessment & Plan Note (Addendum)
Patient counseled today on risks of patient not being compliant with carvedilol, statin therapy, and aspirin.  Patient reports that he believes that he does not need any of these medications.  Upon extensive counseling patient did agree to start taking baby aspirin daily and sent refill into pharmacy.  Patient also to discuss these medications with his cardiologist given that he has history of stent placement and extensive CAD.    Patient does have follow-up with cardiologist 09/26/2018.

## 2018-09-19 NOTE — Assessment & Plan Note (Signed)
Patient with history of essential hypertension currently not taking any antihypertensive medications.  BP well controlled today at 122/60.

## 2018-09-20 ENCOUNTER — Ambulatory Visit: Payer: Medicare HMO | Admitting: Internal Medicine

## 2018-09-26 ENCOUNTER — Encounter: Payer: Self-pay | Admitting: Internal Medicine

## 2018-09-26 ENCOUNTER — Ambulatory Visit (INDEPENDENT_AMBULATORY_CARE_PROVIDER_SITE_OTHER): Payer: Medicare HMO | Admitting: Internal Medicine

## 2018-09-26 VITALS — BP 118/68 | HR 61 | Ht 71.5 in | Wt 178.0 lb

## 2018-09-26 DIAGNOSIS — I483 Typical atrial flutter: Secondary | ICD-10-CM

## 2018-09-26 NOTE — Patient Instructions (Addendum)
Medication Instructions:  Your physician recommends that you continue on your current medications as directed. Please refer to the Current Medication list given to you today.  Labwork: None ordered.  Testing/Procedures: None ordered.  Follow-Up: Your physician wants you to follow-up in: as needed with Dr. Taylor.      Any Other Special Instructions Will Be Listed Below (If Applicable).  If you need a refill on your cardiac medications before your next appointment, please call your pharmacy.   

## 2018-09-26 NOTE — Progress Notes (Signed)
HPI Mr. Coye returns today for followu of atrial flutter, s/p ablation. He is a pleasant 57 yo man with HTN. He underwent catheter ablation of atrial flutter several months ago. He has had no recurrent palpitations or evidence of atrial flutter. He has been off of his anti-coagulation.  Allergies  Allergen Reactions  . Nsaids     REACTION: nsaid induced nephropathy  . Ibuprofen Other (See Comments)    Kidney damage     Current Outpatient Medications  Medication Sig Dispense Refill  . aspirin 81 MG tablet Take 1 tablet (81 mg total) by mouth daily. 90 tablet 3  . diclofenac sodium (VOLTAREN) 1 % GEL Apply 4 g topically 4 (four) times daily. 100 g 2  . sildenafil (VIAGRA) 100 MG tablet Take 1 tablet (100 mg total) by mouth daily as needed for erectile dysfunction. 30 min-4 hrs before sexual activity. Do not use with nitro. 90 tablet 0  . traMADol (ULTRAM) 50 MG tablet Take 1 tablet (50 mg total) by mouth every 12 (twelve) hours as needed for moderate pain. 60 tablet 0   No current facility-administered medications for this visit.      Past Medical History:  Diagnosis Date  . Arthritis   . CAD (coronary artery disease)    a. 03/2015 NSTEMI/PCI: LM nl, LAD 95p (3.5x20 Promus DES), D1 60, D2/3 mild dzs, LCX 7m (plan for staged pci), OM1/2 min irregs, RCA 20p, RPDA 60.  Marland Kitchen Chronic renal insufficiency, stage III (moderate) ([place]) 04/10/2015  . CKD (chronic kidney disease), stage III ([place])   . GERD (gastroesophageal reflux disease)    occ  . HOCM (hypertrophic obstructive cardiomyopathy) ([place])    a. 03/2015 Echo: EF 65-60%, sev LV thickening w/ bright, speckled appearance of IV septum, Gr 2 DD, AoV sclerosis, triv AI, mild MR, LA 96ml/m^2, RA 38ml/m^2, aneurysmal IAS, mod TR, PASP 64mmHg.  Marland Kitchen Hypertension    denies  . PAF (paroxysmal atrial fibrillation) ([place])    remote isolated episode - 09/2012.  . TMJ arthralgia     ROS:   All systems reviewed and negative except as noted  in the HPI.   Past Surgical History:  Procedure Laterality Date  . A-FLUTTER ABLATION N/A 03/13/2018   Procedure: A-FLUTTER ABLATION;  Surgeon: Evans Lance, MD;  Location: [facility];  Service: Cardiovascular;  Laterality: N/A;  . APPENDECTOMY    . CARDIAC CATHETERIZATION N/A 03/19/2015   Procedure: Left Heart Cath and Coronary Angiography;  Surgeon: [name], MD;  Location: [facility];  Service: Cardiovascular;  Laterality: N/A;  . CARDIAC CATHETERIZATION  03/19/2015   Procedure: Coronary Stent Intervention;  Surgeon: [name], MD;  Location: [facility];  Service: Cardiovascular;;  . CARDIAC CATHETERIZATION N/A 04/09/2015   Procedure: Coronary Stent Intervention;  Surgeon: [name], MD;  Location: [facility];  Service: Cardiovascular;  Laterality: N/A;  . CARDIOVERSION N/A 02/15/2018   Procedure: CARDIOVERSION;  Surgeon: [name], MD;  Location: [facility];  Service: Cardiovascular;  Laterality: N/A;  . HIP SURGERY Left 74   lft   . LAPAROSCOPIC APPENDECTOMY  09/22/2012   Procedure: APPENDECTOMY LAPAROSCOPIC;  Surgeon: Harl Bowie, MD;  Location: [facility];  Service: General;;  . LAPAROSCOPY  09/22/2012   Procedure: LAPAROSCOPY DIAGNOSTIC;  Surgeon: Harl Bowie, MD;  Location: [facility];  Service: General;  Laterality: N/A;  . TEE WITHOUT CARDIOVERSION N/A 02/15/2018   Procedure: TRANSESOPHAGEAL ECHOCARDIOGRAM (  TEE);  Surgeon: [name], MD;  Location: [facility];  Service: Cardiovascular;  Laterality: N/A;  . TEE WITHOUT CARDIOVERSION N/A 03/13/2018   Procedure: TRANSESOPHAGEAL ECHOCARDIOGRAM (TEE);  Surgeon: Lelon Perla, MD;  Location: [facility];  Service: Cardiovascular;  Laterality: N/A;  . TOTAL HIP ARTHROPLASTY Right 03,01   x2  . TOTAL HIP ARTHROPLASTY WITH HARDWARE REMOVAL Left 08/05/2014   Procedure: TOTAL HIP ARTHROPLASTY WITH HARDWARE REMOVAL;  Surgeon: Kerin Salen, MD;  Location: [facility];   Service: Orthopedics;  Laterality: Left;     Family History  Problem Relation Age of Onset  . Heart disease Mother   . Heart disease Father      Social History   Socioeconomic History  . Marital status: Divorced    Spouse name: Not on file  . Number of children: Not on file  . Years of education: Not on file  . Highest education level: Not on file  Occupational History  . Not on file  Social Needs  . Financial resource strain: Not on file  . Food insecurity:    Worry: Patient refused    Inability: Patient refused  . Transportation needs:    Medical: Patient refused    Non-medical: Patient refused  Tobacco Use  . Smoking status: Light Tobacco Smoker    Packs/day: 0.25    Years: 10.00    Pack years: 2.50    Types: Cigarettes  . Smokeless tobacco: Never Used  Substance and Sexual Activity  . Alcohol use: Yes    Alcohol/week: 2.0 standard drinks    Types: 2 Cans of beer per week    Comment: occasional  . Drug use: No  . Sexual activity: Not Currently  Lifestyle  . Physical activity:    Days per week: Patient refused    Minutes per session: Patient refused  . Stress: Not on file  Relationships  . Social connections:    Talks on phone: Patient refused    Gets together: Patient refused    Attends religious service: Patient refused    Active member of club or organization: Patient refused    Attends meetings of clubs or organizations: Patient refused    Relationship status: Patient refused  . Intimate partner violence:    Fear of current or ex partner: Patient refused    Emotionally abused: Patient refused    Physically abused: Patient refused    Forced sexual activity: Patient refused  Other Topics Concern  . Not on file  Social History Narrative  . Not on file     BP 118/68   Pulse 61   Ht 5' 11.5" (1.816 m)   Wt 178 lb (80.7 kg)   SpO2 99%   BMI 24.48 kg/m   Physical Exam:  Well appearing NAD HEENT: Unremarkable Neck:  No JVD, no  thyromegally Lymphatics:  No adenopathy Back:  No CVA tenderness Lungs:  Clear with no wheezes HEART:  Regular rate rhythm, no murmurs, no rubs, no clicks Abd:  soft, positive bowel sounds, no organomegally, no rebound, no guarding Ext:  2 plus pulses, no edema, no cyanosis, no clubbing Skin:  No rashes no nodules Neuro:  CN II through XII intact, motor grossly intact  EKG - NSR with a short PR   Assess/Plan: 1. Atrial flutter - he is maintaining NSR. No change in meds. 2. HTN - his blood pressure is well controlled. No change in meds. 3. Diastolic heart failure - his symptoms are well controlled and he is trying  to reduce his intake of sodium.  Mikle Bosworth.D.

## 2018-10-18 ENCOUNTER — Other Ambulatory Visit: Payer: Self-pay | Admitting: Family Medicine

## 2018-10-18 DIAGNOSIS — N529 Male erectile dysfunction, unspecified: Secondary | ICD-10-CM

## 2018-10-18 MED ORDER — SILDENAFIL CITRATE 100 MG PO TABS: 100.0000 mg | ORAL_TABLET | Freq: Every day | ORAL | 0 refills | Status: DC | PRN

## 2018-10-18 NOTE — Telephone Encounter (Signed)
Pt would like to have his Viagra $RemoveBe'100mg'MyQoVxbGl$  refilled to [facility] on [address].

## 2018-10-24 ENCOUNTER — Encounter: Payer: Self-pay | Admitting: Family Medicine

## 2018-10-24 ENCOUNTER — Ambulatory Visit (INDEPENDENT_AMBULATORY_CARE_PROVIDER_SITE_OTHER): Payer: Medicare HMO | Admitting: Family Medicine

## 2018-10-24 ENCOUNTER — Other Ambulatory Visit: Payer: Self-pay

## 2018-10-24 VITALS — BP 118/64 | Temp 98.3°F | Ht 71.0 in | Wt 177.0 lb

## 2018-10-24 DIAGNOSIS — Z9861 Coronary angioplasty status: Secondary | ICD-10-CM

## 2018-10-24 DIAGNOSIS — I2510 Atherosclerotic heart disease of native coronary artery without angina pectoris: Secondary | ICD-10-CM | POA: Diagnosis not present

## 2018-10-24 DIAGNOSIS — F528 Other sexual dysfunction not due to a substance or known physiological condition: Secondary | ICD-10-CM | POA: Diagnosis not present

## 2018-10-24 DIAGNOSIS — M25551 Pain in right hip: Secondary | ICD-10-CM | POA: Diagnosis not present

## 2018-10-24 DIAGNOSIS — N184 Chronic kidney disease, stage 4 (severe): Secondary | ICD-10-CM

## 2018-10-24 DIAGNOSIS — Z23 Encounter for immunization: Secondary | ICD-10-CM

## 2018-10-24 DIAGNOSIS — F1491 Cocaine use, unspecified, in remission: Secondary | ICD-10-CM

## 2018-10-24 DIAGNOSIS — F17200 Nicotine dependence, unspecified, uncomplicated: Secondary | ICD-10-CM

## 2018-10-24 DIAGNOSIS — Z87898 Personal history of other specified conditions: Secondary | ICD-10-CM

## 2018-10-24 DIAGNOSIS — M25552 Pain in left hip: Secondary | ICD-10-CM

## 2018-10-24 MED ORDER — LORATADINE 10 MG PO TABS: 10.0000 mg | ORAL_TABLET | Freq: Every day | ORAL | 3 refills | Status: AC

## 2018-10-24 MED ORDER — FLUTICASONE PROPIONATE 50 MCG/ACT NA SUSP: 2.0000 | Freq: Every day | NASAL | 6 refills | Status: AC

## 2018-10-24 NOTE — Patient Instructions (Addendum)
Thank you for coming to see me today. It was a pleasure! Today we talked about:   Please call and schedule an appointment with the Kidney doctors as your kidney function is worsening.  Please avoid any NSAIDs, such as ibuprofen, Advil, Motrin, naproxen, Aleve. you may use Tylenol for your hip pain.  I have placed a referral for you to be seen by vascular surgery to see if that hip pain is due to peripheral arterial disease.  Please continue taking her aspirin daily and highly consider taking a statin.  Please follow-up with me in 3 months or sooner as needed.  If you have any questions or concerns, please do not hesitate to call the office at [phone].  Take Care,   [name] Valeda Corzine, DO

## 2018-10-24 NOTE — Progress Notes (Signed)
Subjective:    Patient ID: BRENDA COWHER, male    DOB: [birth year], 59 y.o.   MRN: [id number]   Paul [name] is a 57 y.o. male with history of CHF, CADs/p LAD DES in 03/2015, hypertension, PAF, hyperlipidemia, CKD 4, tobacco use,andchronic pain 2/2 arthritis who presents for follow up for hip pain and kidney disease.  HPI:  CAD status post stent Patient reports that he has been taking his aspirin most days of the week.  However he was seen by cardiology who recommended no change in therapy although unsure if they are aware that patient is not compliant with statin or beta-blocker. Patient reports that he does not believe that he has a problem with his cholesterol and that he will not have another heart attack.  He does not want to believe that he is at risk for having another episode.  Spent an extensive amount of time counseling patient on importance of taking statin and beta-blocker given his history of massive MI with stent placement. Patient reports that he has changed his diet and that he believes that this is enough and he does not have a problem. Does report that he is not using any illicit drugs such as cocaine.  Reports that the last time that it was seen in his urine that was a mistake as he was just near some friends at a bar who were doing cocaine.  He is upset that this is still following him in his chart.  CKD stage IV Patient reports that he was told to see a nephrologist after he was in the hospital last time.  Says that he still has their number but has not called them to follow-up.  Bilateral hip pain Patient reporting that his hip pain is still present.  Reports that there is swelling at times.  He is experiencing worse pain when he walks.  Denies any fevers.  Reports that his pain is likely due to his bilateral hip replacement.  He thinks that the tramadol is helping him and he uses it as little as possible.  Smoking status reviewed-smokes 1 pack in a week and a  half but is not interested in any resources to help him quit  ROS: 10 point ROS is otherwise negative, except as mentioned in HPI  Patient Active Problem List   Diagnosis Date Noted  . Stented coronary artery     Priority: High  . Hypertensive cardiomyopathy ([place]) 09/24/2012    Priority: High  . TOBACCO USE 03/31/2007    Priority: High  . Arthritis of left hip 08/05/2014    Priority: Medium  . Hyperlipidemia 05/05/2007    Priority: Medium  . ERECTILE DYSFUNCTION 04/23/2008    Priority: Low  . GERD 02/21/2008    Priority: Low  . Pacemaker infection ([place]) 03/13/2018  . Atrial flutter with rapid ventricular response ([place]) 03/13/2018  . Chronic atrial fibrillation 02/27/2018  . History of cocaine use 02/18/2018  . Elevated LFTs   . CHF exacerbation ([place]) 02/09/2018  . Typical atrial flutter ([place]) 02/09/2018  . Chronic kidney disease (CKD), stage IV (severe) ([place]) 07/17/2017  . Chronic pain syndrome 05/31/2016  . Osteoarthritis, knee 05/31/2016  . CAD - S/P LAD DES May 2016, staged CFX DES June 2016 04/08/2015  . Chronic combined systolic and diastolic CHF (congestive heart failure) ([place]) 03/27/2015  . Microcytic anemia 03/19/2015  . 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 NSTEMI (non-ST elevated myocardial infarction) ([place]) 03/18/2015  . Acute diastolic CHF (congestive heart failure),  NYHA class 4 (HCC) 03/18/2015    Class: Acute  . Essential hypertension 03/18/2015  . Lumbar strain 11/12/2014  . 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 Sinus tachycardia 02/06/2013  . S/P hip replacement 12/25/2012  . 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 Renal artery aneurysm ([place]) 11/15/2012  . Bilateral hip pain 09/05/2012  . Other, mixed, or unspecified nondependent drug abuse, unspecified 12/03/2008  . Hypertrophic obstructive cardiomyopathy ([place]) 08/07/2008  . PAF- last in 2013 02/17/2008  . BACK PAIN 10/11/2007  . Renal failure 01/05/2007      Objective:  BP 118/64   Temp 98.3 F (36.8 C) (Oral)   Ht $R'5\' 11"'kX$  (1.803 m)   Wt 80.3 kg   BMI 24.69 kg/m  Vitals and nursing note reviewed  General: NAD, pleasant, using cane Cardiac: RRR, normal heart sounds, no murmurs Respiratory: CTAB, normal effort Extremities: no edema or cyanosis. WWP. Skin: warm and dry, no rashes noted Neuro: alert and oriented, no focal deficits Psych: normal affect  Assessment & Plan:    ERECTILE DYSFUNCTION Cardiology approved abd given written Rx for sildenafil.   CAD - S/P LAD DES May 2016, staged CFX DES June 2016 Again spent extensive time counseling patient on need for him to be taking his statin, aspirin, and beta-blocker.  Patient reports that he does not believe that an event like this will ever happen again.  He again refuses to take any medications but is agreeable to continue his aspirin 81 daily.  Patient reports that he did not discuss this with his cardiologist.  TOBACCO USE Patient counseled on discontinuing smoking.  He is down to 1 pack in about a week and a half.  Patient does not wish to have any resources at this time.  Given 1 800-quit-now information.  History of cocaine use Patient reports that he is not using cocaine.  Reports that he has never used cocaine in the last time that it was positive in his urine was due to him "smelling his friends cocaine as he was walking through a bar".  Bilateral hip pain Pain could be due to his history of bilateral hip replacements.  However patient with extensive vascular history with massive MI requiring stent back in April.  Would like to obtain ABIs.  Patient refusing to see vascular surgery and would like for [place] to perform ABIs in office.  Ordered today.  Chronic kidney disease (CKD), stage IV (severe) ([place]) Patient reports that he has not scheduled an appointment with [facility] but that he will get on this as soon as possible.  Again counseled on avoiding nephrotoxic agents  such as NSAIDs and encouraged to keep blood pressure under control.  BP well controlled at 118/64 today.  Of note, patient not agreeable to many managements that I believe would be helpful for him.   [name] Geralynn Capri, DO Family Medicine Resident PGY-2

## 2018-10-25 MED ORDER — TETANUS-DIPHTH-ACELL PERTUSSIS 5-2.5-18.5 LF-MCG/0.5 IM SUSP: 0.5000 mL | Freq: Once | INTRAMUSCULAR | Status: DC

## 2018-10-27 NOTE — Assessment & Plan Note (Signed)
Cardiology approved abd given written Rx for sildenafil.

## 2018-11-01 NOTE — Assessment & Plan Note (Signed)
Patient reports that he has not scheduled an appointment with [facility] but that he will get on this as soon as possible.  Again counseled on avoiding nephrotoxic agents such as NSAIDs and encouraged to keep blood pressure under control.  BP well controlled at 118/64 today.

## 2018-11-01 NOTE — Assessment & Plan Note (Signed)
Again spent extensive time counseling patient on need for him to be taking his statin, aspirin, and beta-blocker.  Patient reports that he does not believe that an event like this will ever happen again.  He again refuses to take any medications but is agreeable to continue his aspirin 81 daily.  Patient reports that he did not discuss this with his cardiologist.

## 2018-11-01 NOTE — Assessment & Plan Note (Signed)
Patient counseled on discontinuing smoking.  He is down to 1 pack in about a week and a half.  Patient does not wish to have any resources at this time.  Given 1 800-quit-now information.

## 2018-11-01 NOTE — Assessment & Plan Note (Signed)
Patient reports that he is not using cocaine.  Reports that he has never used cocaine in the last time that it was positive in his urine was due to him "smelling his friends cocaine as he was walking through a bar".

## 2018-11-01 NOTE — Assessment & Plan Note (Signed)
Pain could be due to his history of bilateral hip replacements.  However patient with extensive vascular history with massive MI requiring stent back in April.  Would like to obtain ABIs.  Patient refusing to see vascular surgery and would like for [place] to perform ABIs in office.  Ordered today.

## 2018-11-13 ENCOUNTER — Other Ambulatory Visit: Payer: Self-pay | Admitting: Family Medicine

## 2018-11-16 NOTE — Telephone Encounter (Signed)
Reviewed [place]MP[place] and refills appropriate

## 2019-01-24 ENCOUNTER — Other Ambulatory Visit: Payer: Self-pay | Admitting: Family Medicine

## 2019-01-24 DIAGNOSIS — N529 Male erectile dysfunction, unspecified: Secondary | ICD-10-CM

## 2019-02-08 ENCOUNTER — Telehealth: Payer: Self-pay | Admitting: Internal Medicine

## 2019-02-08 MED ORDER — CARVEDILOL 3.125 MG PO TABS: 3.1250 mg | ORAL_TABLET | Freq: Two times a day (BID) | ORAL | 3 refills | Status: AC

## 2019-02-08 MED ORDER — FUROSEMIDE 40 MG PO TABS: ORAL_TABLET | ORAL | 3 refills | Status: AC

## 2019-02-08 NOTE — Telephone Encounter (Signed)
Call returned to Pt.  Per Pt he has been having some sob at night.  He does ok while he is up and moving around.  States he has noticed some edema and had a fluid pill he took which improved the swelling.  Discussed Pt last TEE results with EF of 25-30.  Discussed fluid pills, and beta blockers and why he should take them as they were prescribed after his last hospitalization.  Discussed heart failure and why he is sob at night.  Pt seemed to understand what I was saying.  Endorses he would be willing to restart beta blocker and fluid pill. Discussed with Dr. Lovena Le.  In agreement.  Order received for low dose coreg and lasix.  Orders entered.  Pt states he is on mychart.  Asked him to send a message to this nurse next week to see how he is doing.    Discussed Covid 19 and symptoms.  Advised Pt to stay home if possible and social distance and wash hands.

## 2019-02-08 NOTE — Telephone Encounter (Signed)
° °  Pt c/o Shortness Of Breath: STAT if SOB developed within the last 24 hours or pt is noticeably SOB on the phone  1. Are you currently SOB (can you hear that pt is SOB on the phone)? NO  2. How long have you been experiencing SOB?  2- 3 days  3. Are you SOB when sitting or when up moving around? When laying down  4. Are you currently experiencing any other symptoms? no

## 2019-03-20 ENCOUNTER — Telehealth: Payer: Self-pay | Admitting: Internal Medicine

## 2019-03-20 NOTE — Telephone Encounter (Signed)
New message    [facility] for pt to call and schedule appt with Dr. Lovena Le. Will offer doxemity video if pt has a smart phone, if not will offer phone call visit with provider. Per RN, medication f/u.

## 2019-03-26 NOTE — Telephone Encounter (Signed)
Follow up     [facility] for pt to call and schedule virtual visit with Dr. Lovena Le. If pt returns call, please reach out via secure chat. I will speak to pt.

## 2019-04-16 ENCOUNTER — Other Ambulatory Visit: Payer: Self-pay | Admitting: Family Medicine

## 2019-04-16 DIAGNOSIS — N529 Male erectile dysfunction, unspecified: Secondary | ICD-10-CM

## 2019-04-16 NOTE — Telephone Encounter (Signed)
Unable to reach Pt by phone or myChart.  Will send recall notice advising Pt to make f/u appt.

## 2019-04-29 ENCOUNTER — Other Ambulatory Visit: Payer: Self-pay | Admitting: Family Medicine

## 2019-05-01 NOTE — Telephone Encounter (Signed)
Patient informed and appt made for 05-09-2019.  Jazmin Hartsell,CMA

## 2019-05-01 NOTE — Telephone Encounter (Signed)
Patient has not required tramadol in a while, likely he does not need or does not need as much, he should be scheduled for office visit, in person or virtual.

## 2019-05-09 ENCOUNTER — Ambulatory Visit: Payer: Medicare HMO | Admitting: Family Medicine

## 2019-06-04 ENCOUNTER — Ambulatory Visit: Payer: Medicare HMO | Admitting: Family Medicine

## 2019-06-23 ENCOUNTER — Telehealth: Payer: Self-pay | Admitting: Cardiology

## 2019-06-23 NOTE — Telephone Encounter (Signed)
Pt called in with plaints of shortness of breath when laying down last night and ankle/lower leg puffiness.  He does not weigh himself.  He denies dyspnea on exertion but his son who is present says that he has had some intermittent dyspnea with walking through the house.  The patient admits that he has been eating bacon and eggs in the morning and eating some "good" fried chicken, he has had high sodium intake.  The patient has Lasix 40 mg as needed and he took a dose yesterday and this morning.  He notes that he has not had much urine output since this morning's dose.  He has CKD, followed by nephrology but has not been seen in a while.  He says his PCP follows his labs. We will have patient take an extra Lasix 40 mg today for total of 80 mg and then 40 mg tomorrow and Monday.  I would like for him to have a BMP, BNP on Monday.  I will route this to Dr. Irish Lack and triage.  I feel the patient needs to be seen soon, he has had no recent follow-up for his chronic combined heart failure. I advised the patient to start weighing himself daily and what to watch for and report.  Advised reduce sodium intake.  I discussed symptoms for which she should report to the hospital.  He verbalizes understanding.

## 2019-06-24 NOTE — Telephone Encounter (Signed)
May need a repeat echo as well if it has been a few years since the last.

## 2019-06-25 NOTE — Telephone Encounter (Signed)
Left the pt a message to call the office back to arrange for him an OV with Daune Perch NP for this Thursday 8/20 at 1130, as well as lab same day, per [place].  Held the pt a slot on Janine Hammonds schedule for this Thursday 8/20 at 1130.

## 2019-06-26 NOTE — Telephone Encounter (Signed)
Left message for patient to call back  

## 2019-06-27 ENCOUNTER — Other Ambulatory Visit: Payer: Self-pay | Admitting: Family Medicine

## 2019-06-27 NOTE — Telephone Encounter (Signed)
Left message for patient to call back  

## 2019-06-27 NOTE — Telephone Encounter (Signed)
Per management- ok to unblock held slot and use d/t being unable to reach pt. Pt still needs to be contacted and scheduled.

## 2019-07-23 ENCOUNTER — Other Ambulatory Visit: Payer: Self-pay | Admitting: Family Medicine

## 2019-07-23 DIAGNOSIS — N529 Male erectile dysfunction, unspecified: Secondary | ICD-10-CM

## 2019-07-26 NOTE — Telephone Encounter (Signed)
Finally reached the patient. Scheduled appointment with [name], NP on 9/21 at 3:00 PM.

## 2019-07-27 NOTE — Progress Notes (Signed)
Cardiology Office Note   Date:  07/30/2019   ID:  Paul [name], DOB 12-23-1960, MRN [id number]  PCP:  [name], [name], DO  Cardiologist: Dr. Irish Lack, MD   Chief Complaint  Patient presents with  . Shortness of Breath    History of Present Illness: Paul [name] is a 58 y.o. male with a history of CAD status post MI treated with DES to the LAD with staged PCI/DES to the circumflex in 2016. He stopped his medications after 1 month.  Also has hypertension CKD stage III and atrial fibrillation s/p AF ablation  Presented initially presented to [facility] in 2019 with distress, rapid atrial flutter and hypertension. He then became hypotensive requiring pressors until he was cardioverted. He under went successful cardioversion with subsequent bradycardia and therefore metoprolol was held.  Echocardiogram 02/10/2018 showed four-chamber enlargement with marked LVH and speckled pattern of LV myocardium and trivial pericardial effusion suggestive of infiltrative disease such as amyloidosis. RV systolic pressure was increased consistent with moderate pulmonary hypertension. LV EF 30 to 35% with moderate MR. He was ultimately diuresedin the hospital but was not sent home on diuretics because of renal insufficiency and hypotension. He was told to take them as needed for weight gain of 2 or 3 pounds overnight.  He was then seen in follow-up with [name] PA and was back in atrial flutter with ventricular rates around 130 bpm. Dr. Curt Bears who suggested to increase his coreg to 6.125 mg bid.  His BNP at that time was greater than 13,000 and creatinine was 2.56.  He was then seen by Dr. Lovena Le 01/2018 and was ultimately set up for an ablation with TEE.  Procedure was performed on 03/13/2018.  TEE at that time showed LVEF of 25 to 30% with diffuse hypokinesis.  He was then seen by Dr. Lovena Le 09/26/2018 in follow-up.  He had no recurrent palpitations or evidence of atrial flutter.  He was off of his  anticoagulation.  He had no symptoms of HF at that time.  Mr. Grieshop then called our office on 06/23/2019 with complaints of what appears to be orthopnea symptoms and lower extremity swelling.  He had no shortness of breath on exertion however son reported dyspnea with ambulation within his house.  He had many diet indiscretions with bacon, eggs and fried chicken.  He had taken 40 mg of Lasix PRN the day before the call as well as 06/23/2019.  He has known CKD however was noted to have not followed with nephrology in quite a while.  It was recommended that he come to our office for lab work.  It was also suggested to begin weighing himself daily and to watch his sodium intake.  It does not appear that his lab work was ever performed.  Today, he presents and states that he has been taking his PRN Lasix approximately 3 times a week since his office phone call.  He reports that his symptoms have improved however he is not back to his baseline.  Does report relatively sedentary lifestyle over the past couple months and that this could possibly be contributing to his exertional shortness of breath.  He has no edema on exam today and does not appear to be fluid volume overloaded.  His lungs are clear.  He does state that he he drinks approximately 1 gallon of fluid a day.  We discussed fluid restrictions given his reduced LV function.  Also discussed sodium restriction and foods that have high  sodium that he should avoid.  He understands.  He also describes symptoms of exertional angina.  He states that at times if he is moving boxes or performing activities in his house he will have exertional chest tightness, not pain.  This is concerning given his prior history.  He reports intermittently taken his ASA.  Given this, we discussed further ischemic work-up as well as a repeat echocardiogram.  We will draw labs today to assess his renal function however I presume his diuretic regimen will be limited by this.  We also  discussed follow-up with nephrology as this will be crucial if he needs more regular diuretic therapy.  Past Medical History:  Diagnosis Date  . Arthritis   . CAD (coronary artery disease)    a. 03/2015 NSTEMI/PCI: LM nl, LAD 95p (3.5x20 Promus DES), D1 60, D2/3 mild dzs, LCX 93m (plan for staged pci), OM1/2 min irregs, RCA 20p, RPDA 60.  Marland Kitchen Chronic renal insufficiency, stage III (moderate) ([place]) 04/10/2015  . CKD (chronic kidney disease), stage III ([place])   . GERD (gastroesophageal reflux disease)    occ  . HOCM (hypertrophic obstructive cardiomyopathy) ([place])    a. 03/2015 Echo: EF 65-60%, sev LV thickening w/ bright, speckled appearance of IV septum, Gr 2 DD, AoV sclerosis, triv AI, mild MR, LA 83ml/m^2, RA 38ml/m^2, aneurysmal IAS, mod TR, PASP 88mmHg.  Marland Kitchen Hypertension    denies  . PAF (paroxysmal atrial fibrillation) ([place])    remote isolated episode - 09/2012.  . TMJ arthralgia     Past Surgical History:  Procedure Laterality Date  . A-FLUTTER ABLATION N/A 03/13/2018   Procedure: A-FLUTTER ABLATION;  Surgeon: Evans Lance, MD;  Location: [facility];  Service: Cardiovascular;  Laterality: N/A;  . APPENDECTOMY    . CARDIAC CATHETERIZATION N/A 03/19/2015   Procedure: Left Heart Cath and Coronary Angiography;  Surgeon: [name], MD;  Location: [place] [facility];  Service: Cardiovascular;  Laterality: N/A;  . CARDIAC CATHETERIZATION  03/19/2015   Procedure: Coronary Stent Intervention;  Surgeon: [name], MD;  Location: [place] [facility];  Service: Cardiovascular;;  . CARDIAC CATHETERIZATION N/A 04/09/2015   Procedure: Coronary Stent Intervention;  Surgeon: [name], MD;  Location: [place] [facility];  Service: Cardiovascular;  Laterality: N/A;  . CARDIOVERSION N/A 02/15/2018   Procedure: CARDIOVERSION;  Surgeon: [name], MD;  Location: [facility];  Service: Cardiovascular;  Laterality: N/A;  . HIP SURGERY Left 74   lft   . LAPAROSCOPIC  APPENDECTOMY  09/22/2012   Procedure: APPENDECTOMY LAPAROSCOPIC;  Surgeon: Harl Bowie, MD;  Location: [facility];  Service: General;;  . LAPAROSCOPY  09/22/2012   Procedure: LAPAROSCOPY DIAGNOSTIC;  Surgeon: Harl Bowie, MD;  Location: [facility];  Service: General;  Laterality: N/A;  . TEE WITHOUT CARDIOVERSION N/A 02/15/2018   Procedure: TRANSESOPHAGEAL ECHOCARDIOGRAM (TEE);  Surgeon: [name], MD;  Location: [place];  Service: Cardiovascular;  Laterality: N/A;  . TEE WITHOUT CARDIOVERSION N/A 03/13/2018   Procedure: TRANSESOPHAGEAL ECHOCARDIOGRAM (TEE);  Surgeon: Lelon Perla, MD;  Location: [facility];  Service: Cardiovascular;  Laterality: N/A;  . TOTAL HIP ARTHROPLASTY Right 03,01   x2  . TOTAL HIP ARTHROPLASTY WITH HARDWARE REMOVAL Left 08/05/2014   Procedure: TOTAL HIP ARTHROPLASTY WITH HARDWARE REMOVAL;  Surgeon: Kerin Salen, MD;  Location: [place] [facility];  Service: Orthopedics;  Laterality: Left;     Current Outpatient Medications  Medication Sig Dispense Refill  . aspirin 81 MG tablet  Take 1 tablet (81 mg total) by mouth daily. 90 tablet 3  . carvedilol (COREG) 3.125 MG tablet Take 1 tablet (3.125 mg total) by mouth 2 (two) times daily. 180 tablet 3  . fluticasone (FLONASE) 50 MCG/ACT nasal spray Place 2 sprays into both nostrils daily. 16 g 6  . furosemide (LASIX) 40 MG tablet Take one tablet daily for 3 days.  Then take one tablet by mouth as needed for swelling or shortness of breath at night. 90 tablet 3  . loratadine (CLARITIN) 10 MG tablet Take 1 tablet (10 mg total) by mouth daily. 90 tablet 3  . sildenafil (VIAGRA) 100 MG tablet TAKE 1 TABLET BY MOUTH DAILY FOR ERECTILE DYSFUNCTION (30 MINUTES TO 4 HOURS BEFORE SEX). 90 tablet 0  . traMADol (ULTRAM) 50 MG tablet TAKE 1 TABLET BY MOUTH EVERY 12 HOURS AS NEEDED FOR MODERATE PAIN 60 tablet 0   No current facility-administered medications for this visit.     Allergies:   Nsaids and Ibuprofen    Social  History:  The patient  reports that he has been smoking cigarettes. He has a 2.50 pack-year smoking history. He has never used smokeless tobacco. He reports current alcohol use of about 2.0 standard drinks of alcohol per week. He reports that he does not use drugs.   Family History:  The patient'sfamily history includes Heart disease in his father and mother.    ROS:  Please see the history of present illness.  Otherwise, review of systems are positive for none.   All other systems are reviewed and negative.    PHYSICAL EXAM: VS:  BP 114/76   Pulse (!) 112   Ht $R'5\' 11"'FX$  (1.803 m)   Wt 192 lb 1.9 oz (87.1 kg)   SpO2 97%   BMI 26.80 kg/m  , BMI Body mass index is 26.8 kg/m.   General: Well developed, well nourished, NAD Neck: Negative for carotid bruits. No JVD Lungs:Clear to ausculation bilaterally. No wheezes Cardiovascular: RRR with S1 S2. No murmur Extremities: No edema. No clubbing or cyanosis. DP pulses 2+ bilaterally Neuro: Alert and oriented. No focal deficits. No facial asymmetry. MAE spontaneously. Psych: Responds to questions appropriately with normal affect.     EKG:  EKG is not ordered today.   Recent Labs: 09/12/2018: BUN 28; Creatinine, Ser 1.94; Potassium 5.1; Sodium 143    Lipid Panel    Component Value Date/Time   CHOL 159 07/13/2017 1001   TRIG 72 07/13/2017 1001   HDL 56 07/13/2017 1001   CHOLHDL 2.8 07/13/2017 1001   CHOLHDL 2.9 09/28/2016 1641   VLDL 21 09/28/2016 1641   LDLCALC 89 07/13/2017 1001   LDLDIRECT 111 (H) 03/31/2007 2119     Wt Readings from Last 3 Encounters:  07/30/19 192 lb 1.9 oz (87.1 kg)  10/24/18 177 lb (80.3 kg)  09/26/18 178 lb (80.7 kg)     Other studies Reviewed: Additional studies/ records that were reviewed today include:   Nuclear scan for amyloidosis 02/2018 IMPRESSION: Visual and quantitative assessment (grade 0 to 1, H/CLL equal 1.17) are equivocal for transthyretin amyloidosis.   Electronically Signed By:  Kerby Moors M.D. On: 02/16/2018 16:55  Cardiac catheterization 2016  Conclusion    Prox RCA lesion, 20% stenosed.  RPDA lesion, 60% stenosed.  1st Diag lesion, 60% stenosed.  Mid Cx lesion, 90% stenosed. There is a 0% residual stenosis post intervention.  A drug-eluting stent was placed.  1. Patent LAD stent  2. Successful angioplasty and drug-eluting  stent placement to the mid left circumflex.  Recommendations: Hydrated overnight, aggressive medical therapy. Dual antiplatelets therapy for at least 1 years.    ECHO: 02/10/2018 - Left ventricle: There is marked hypertrophy of the interventricular septum as well as the papillary muscles with a speckled pattern to the myocardium suggestion possible amyloidosis. The cavity size was severely dilated. There was moderate concentric and severe asymmetric hypertrophy. Systolic function was moderately to severely reduced. The estimated ejection fraction was in the range of 30% to 35%. Moderate diffuse hypokinesis with distinct regional wall motion abnormalities. There is akinesis of the basal-midinferolateral myocardium. The study is not technically sufficient to allow evaluation of LV diastolic function. - Ventricular septum: The contour showed mild diastolic flattening and mild systolic flattening. These changes are consistent with RV volume and pressure overload. - Aortic valve: Trileaflet; moderately thickened, moderately calcified leaflets. - Mitral valve: There was moderate regurgitation. - Left atrium: The atrium was massively dilated. - Right ventricle: The cavity size was severely dilated. Wall thickness was normal. Systolic function was moderately reduced. - Right atrium: The atrium was massively dilated. - Tricuspid valve: There was moderate regurgitation. - Pulmonic valve: There was trivial regurgitation. - Pulmonary arteries: PA peak pressure: 47 mm Hg (S). - Pericardium,  extracardiac: A trivial, free-flowing pericardial effusion was identified along the right atrial free wall. The fluid had no internal echoes. - Impressions: 4 chamber enlargement with marked LVH and speckled pattern of LV myocardium as well as trivial pericardial effusion suggestive of infiltrative disease such as amyloidosis. Compared to prior echo, LV and RV function have significantly declined. Recommend cardiac MRI.  Impressions:  - 4 chamber enlargement with marked LVH and speckled pattern of LV myocardium as well as trivial pericardial effusion suggestive of infiltrative disease such as amyloidosis. Compared to prior echo, LV and RV function have significantly declined. Recommend cardiac MRI. The right ventricular systolic pressure was increased consistent with moderate pulmonary hypertension.   TEE 03/13/2018:  - Left ventricle: Wall thickness was increased in a pattern of   severe LVH. Systolic function was severely reduced. The estimated   ejection fraction was in the range of 25% to 30%. Diffuse   hypokinesis. - Aortic valve: No evidence of vegetation. - Mitral valve: No evidence of vegetation. There was moderate   regurgitation. - Left atrium: The atrium was moderately dilated. No evidence of   thrombus in the atrial cavity or appendage. - Right ventricle: The cavity size was mildly dilated. Systolic   function was severely reduced. - Right atrium: The atrium was mildly dilated. - Atrial septum: No defect or patent foramen ovale was identified. - Tricuspid valve: No evidence of vegetation. - Pulmonic valve: No evidence of vegetation. - Pericardium, extracardiac: A trivial pericardial effusion was   identified.  Impressions:  - Severe global reduction in LV systolic function; severe LVH;   moderate MR; moderate LAE; mild RVE with severe RV dysfunction;   mild RAE; mild TR.  ASSESSMENT AND PLAN:  1.  Likely tachycardia mediated  cardiomyopathy: -Acute on chronic combined systolic and diastolic>> last LV assessment from TEE at AF ablation 03/13/2018 with LVEF noted to be 25 to 30% with severe global reduction in LV systolic function; severe LVH; moderate MR; moderate LAE; mild RVE with severe RV dysfunction; mild RAE; mild TR.  -Cardiomyopathy with 2D echo suggestive of amyloid heart but not found on NM scan-testing limited at this time because of renal failure. -Will repeat echocardiogram to compare to previous TEE results.  -Obtain  BMET and BNP>>>needs to follow with nephrology -Will based diuretic need on BMET, BNP results however does not appear to be overtly fluid volume overloaded on exam -Reports he does not follow fluid restriction>>drinks 1 gallon of water per day>>we discussed proper fluid volume based on his LV function  -Continue carvedilol 3.125 mg -He is tachycardic today>>he is to monitor BP/HR. May need to increase to 6.25 however limited due to low normal BP -No ACE/ARB in the setting of renal insufficiency  2.  Hypertension: -Low normal BP today -May need to increase carvedilol to 6.25   3.  CKD stage IV: -Last creatinine available in epic on 09/12/2018 creatinine, 1.94 -Baseline appears to be in the 1.9- 4.0 range since 2016 -BMET today  -Reinforced need to follow with nephrology   4. CAD status post MI treated with staged DES to the LAD and circumflex 04/2015: -Previously stopped all medications after 1 month with no recurrent anginal symptoms -Reports what sounds like exertional chest tightness more recently. Given his hx, will obtain stress test for further evaluation  -Reports not consistently taking ASA>>reinforced this as well  -Continue ASA, beta-blocker    Current medicines are reviewed at length with the patient today.  The patient does not have concerns regarding medicines.  The following changes have been made:  no change  Labs/ tests ordered today include: BMET, BNP  Orders  Placed This Encounter  Procedures  . Basic metabolic panel  . Pro b natriuretic peptide (BNP)  . MYOCARDIAL PERFUSION IMAGING  . ECHOCARDIOGRAM COMPLETE    Disposition:   FU with APP/Dr. Marlou Porch in 3 weeks  Signed, [name], NP  07/30/2019 4:28 PM    [facility] [address], [place], [place]  [address] Phone: [phone]; Fax: [phone]

## 2019-07-30 ENCOUNTER — Other Ambulatory Visit: Payer: Self-pay

## 2019-07-30 ENCOUNTER — Ambulatory Visit (INDEPENDENT_AMBULATORY_CARE_PROVIDER_SITE_OTHER): Payer: Medicare HMO | Admitting: Cardiology

## 2019-07-30 ENCOUNTER — Encounter: Payer: Self-pay | Admitting: Cardiology

## 2019-07-30 VITALS — BP 114/76 | HR 112 | Ht 71.0 in | Wt 192.1 lb

## 2019-07-30 DIAGNOSIS — R079 Chest pain, unspecified: Secondary | ICD-10-CM | POA: Diagnosis not present

## 2019-07-30 DIAGNOSIS — I10 Essential (primary) hypertension: Secondary | ICD-10-CM | POA: Diagnosis not present

## 2019-07-30 DIAGNOSIS — I110 Hypertensive heart disease with heart failure: Secondary | ICD-10-CM

## 2019-07-30 DIAGNOSIS — I2510 Atherosclerotic heart disease of native coronary artery without angina pectoris: Secondary | ICD-10-CM | POA: Diagnosis not present

## 2019-07-30 DIAGNOSIS — N184 Chronic kidney disease, stage 4 (severe): Secondary | ICD-10-CM

## 2019-07-30 DIAGNOSIS — I43 Cardiomyopathy in diseases classified elsewhere: Secondary | ICD-10-CM

## 2019-07-30 DIAGNOSIS — Z9861 Coronary angioplasty status: Secondary | ICD-10-CM

## 2019-07-30 NOTE — Patient Instructions (Addendum)
Medication Instructions:  Your physician recommends that you continue on your current medications as directed. Please refer to the Current Medication list given to you today.  If you need a refill on your cardiac medications before your next appointment, please call your pharmacy.   Lab work: TODAY: BMET, BNP If you have labs (blood work) drawn today and your tests are completely normal, you will receive your results only by: Marland Kitchen MyChart Message (if you have MyChart) OR . A paper copy in the mail If you have any lab test that is abnormal or we need to change your treatment, we will call you to review the results.  Testing/Procedures: Your physician has requested that you have an echocardiogram. Echocardiography is a painless test that uses sound waves to create images of your heart. It provides your doctor with information about the size and shape of your heart and how well your heart's chambers and valves are working. This procedure takes approximately one hour. There are no restrictions for this procedure.  Your physician has requested that you have en exercise stress myoview. For further information please visit [email or web address]. Please follow instruction sheet, as given.  Follow-Up: At [facility], you and your health needs are our priority.  As part of our continuing mission to provide you with exceptional heart care, we have created designated Provider Care Teams.  These Care Teams include your primary Cardiologist (physician) and Advanced Practice Providers (APPs -  Physician Assistants and Nurse Practitioners) who all work together to provide you with the care you need, when you need it. You will need a follow up appointment  AFTER ALL PROCEDURES DONE.

## 2019-07-31 ENCOUNTER — Telehealth: Payer: Self-pay

## 2019-07-31 ENCOUNTER — Telehealth: Payer: Self-pay | Admitting: Internal Medicine

## 2019-07-31 DIAGNOSIS — E875 Hyperkalemia: Secondary | ICD-10-CM

## 2019-07-31 DIAGNOSIS — N184 Chronic kidney disease, stage 4 (severe): Secondary | ICD-10-CM

## 2019-07-31 DIAGNOSIS — I43 Cardiomyopathy in diseases classified elsewhere: Secondary | ICD-10-CM

## 2019-07-31 DIAGNOSIS — I110 Hypertensive heart disease with heart failure: Secondary | ICD-10-CM

## 2019-07-31 LAB — BASIC METABOLIC PANEL
BUN/Creatinine Ratio: 16 (ref 10–24)
BUN: 33 mg/dL — ABNORMAL HIGH (ref 8–27)
CO2: 22 mmol/L (ref 20–29)
Calcium: 9.6 mg/dL (ref 8.6–10.2)
Chloride: 107 mmol/L — ABNORMAL HIGH (ref 96–106)
Creatinine, Ser: 2.03 mg/dL — ABNORMAL HIGH (ref 0.76–1.27)
GFR calc Af Amer: 40 mL/min/{1.73_m2} — ABNORMAL LOW (ref >59)
GFR calc non Af Amer: 35 mL/min/{1.73_m2} — ABNORMAL LOW (ref >59)
Glucose: 96 mg/dL (ref 65–99)
Potassium: 6.2 mmol/L (ref 3.5–5.2)
Sodium: 143 mmol/L (ref 134–144)

## 2019-07-31 LAB — PRO B NATRIURETIC PEPTIDE: NT-Pro BNP: 9273 pg/mL — ABNORMAL HIGH (ref 0–210)

## 2019-07-31 NOTE — Telephone Encounter (Signed)
[facility] calling with critical lab results

## 2019-07-31 NOTE — Telephone Encounter (Signed)
-----   Message from Tommie [facility], NP sent at 07/31/2019  9:48 AM EDT ----- Please have the patient take Kayexalate 15g once and we will need to repeat his lab work in 1 week. Also needs to be seen by nephrology as soon as possible. His fluid level that we drew yesterday was elevated as it previously was. Have him continue with fluid restriction as we discussed during his appointment. Take Lasix $RemoveBe'40mg'svhWtRCFr$  QD M, W, F this week then take PRN thereafter until lab re-check and follow up echo and stress test.

## 2019-07-31 NOTE — Telephone Encounter (Signed)
I received a call from Paul [name] with a critical K on the patient of 6.2.  I spoke to [name] and she will further advise.

## 2019-07-31 NOTE — Telephone Encounter (Signed)
Notes recorded by Frederik Schmidt, RN on 07/31/2019 at 10:07 AM EDT  [id number] 9/22  ------

## 2019-08-01 MED ORDER — SODIUM POLYSTYRENE SULFONATE 15 GM/60ML PO SUSP: 15.0000 g | Freq: Once | ORAL | 0 refills | Status: AC

## 2019-08-01 NOTE — Telephone Encounter (Signed)
° °  Please return call to patient to discuss recommendation

## 2019-08-01 NOTE — Addendum Note (Signed)
Addended by: Thompson Grayer on: 08/01/2019 11:26 AM   Modules accepted: Orders

## 2019-08-01 NOTE — Telephone Encounter (Signed)
I spoke with pt and reviewed lab results and recommendations from [facility] with him. Will send Kayexalate prescription to [facility] at [address] and [address]. He is aware to pick up and take today.  Pt will be in office on 9/29 for echo and stress test and will have lab work done at that time.  Pt will contact nephrology for appointment but is aware appointment cannot be after 9/25 COVID testing though 9/29 stress test as he will be quarantining.  Pt also reports he eats 2-3 bananas daily and drinks orange juice daily.  He will remove both of these from his diet for now.

## 2019-08-02 ENCOUNTER — Telehealth (HOSPITAL_COMMUNITY): Payer: Self-pay

## 2019-08-02 ENCOUNTER — Other Ambulatory Visit (HOSPITAL_COMMUNITY)
Admission: RE | Admit: 2019-08-02 | Discharge: 2019-08-02 | Disposition: A | Discharge status: Home / Self Care | Payer: Medicare HMO | Source: Ambulatory Visit | Attending: Cardiology | Admitting: Cardiology

## 2019-08-02 DIAGNOSIS — Z01812 Encounter for preprocedural laboratory examination: Secondary | ICD-10-CM | POA: Diagnosis present

## 2019-08-02 DIAGNOSIS — Z20828 Contact with and (suspected) exposure to other viral communicable diseases: Secondary | ICD-10-CM | POA: Diagnosis not present

## 2019-08-02 NOTE — Telephone Encounter (Signed)
Spoke with the patient, instructions given. He stated that he would be here for his test. Asked to call back with any questions. S.Sherhonda Gaspar EMTP

## 2019-08-03 ENCOUNTER — Other Ambulatory Visit (HOSPITAL_COMMUNITY): Payer: Medicare HMO

## 2019-08-03 LAB — NOVEL CORONAVIRUS, NAA (HOSP ORDER, SEND-OUT TO REF LAB; TAT 18-24 HRS): SARS-CoV-2, NAA: NOT DETECTED

## 2019-08-07 ENCOUNTER — Ambulatory Visit (HOSPITAL_COMMUNITY): Payer: Medicare HMO

## 2019-08-07 ENCOUNTER — Other Ambulatory Visit (HOSPITAL_COMMUNITY): Payer: Medicare HMO

## 2019-08-07 ENCOUNTER — Other Ambulatory Visit: Payer: Medicare HMO

## 2019-08-14 ENCOUNTER — Ambulatory Visit (HOSPITAL_BASED_OUTPATIENT_CLINIC_OR_DEPARTMENT_OTHER): Payer: Medicare HMO

## 2019-08-14 ENCOUNTER — Other Ambulatory Visit: Payer: Medicare HMO

## 2019-08-14 ENCOUNTER — Other Ambulatory Visit: Payer: Self-pay

## 2019-08-14 ENCOUNTER — Ambulatory Visit (HOSPITAL_COMMUNITY): Payer: Medicare HMO | Attending: Cardiovascular Disease

## 2019-08-14 DIAGNOSIS — R079 Chest pain, unspecified: Secondary | ICD-10-CM

## 2019-08-14 DIAGNOSIS — I110 Hypertensive heart disease with heart failure: Secondary | ICD-10-CM

## 2019-08-14 DIAGNOSIS — I43 Cardiomyopathy in diseases classified elsewhere: Secondary | ICD-10-CM

## 2019-08-14 DIAGNOSIS — E875 Hyperkalemia: Secondary | ICD-10-CM

## 2019-08-14 DIAGNOSIS — N184 Chronic kidney disease, stage 4 (severe): Secondary | ICD-10-CM

## 2019-08-14 LAB — BASIC METABOLIC PANEL
BUN/Creatinine Ratio: 18 (ref 10–24)
BUN: 39 mg/dL — ABNORMAL HIGH (ref 8–27)
CO2: 21 mmol/L (ref 20–29)
Calcium: 9.1 mg/dL (ref 8.6–10.2)
Chloride: 108 mmol/L — ABNORMAL HIGH (ref 96–106)
Creatinine, Ser: 2.21 mg/dL — ABNORMAL HIGH (ref 0.76–1.27)
GFR calc Af Amer: 36 mL/min/{1.73_m2} — ABNORMAL LOW (ref >59)
GFR calc non Af Amer: 31 mL/min/{1.73_m2} — ABNORMAL LOW (ref >59)
Glucose: 92 mg/dL (ref 65–99)
Potassium: 4.5 mmol/L (ref 3.5–5.2)
Sodium: 141 mmol/L (ref 134–144)

## 2019-08-14 LAB — MYOCARDIAL PERFUSION IMAGING
Peak HR: 120 {beats}/min
Rest HR: 117 {beats}/min
SDS: 2
SRS: 3
SSS: 5
TID: 1.03

## 2019-08-14 MED ORDER — TECHNETIUM TC 99M TETROFOSMIN IV KIT
10.9000 | PACK | Freq: Once | INTRAVENOUS | Status: AC | PRN
  Administered 2019-08-14: 10.9 via INTRAVENOUS
  Filled 2019-08-14: qty 11

## 2019-08-14 MED ORDER — REGADENOSON 0.4 MG/5ML IV SOLN
0.4000 mg | Freq: Once | INTRAVENOUS | Status: AC
  Administered 2019-08-14: 0.4 mg via INTRAVENOUS

## 2019-08-14 MED ORDER — TECHNETIUM TC 99M TETROFOSMIN IV KIT
31.8000 | PACK | Freq: Once | INTRAVENOUS | Status: AC | PRN
  Administered 2019-08-14: 31.8 via INTRAVENOUS
  Filled 2019-08-14: qty 32

## 2019-08-15 ENCOUNTER — Telehealth: Payer: Self-pay

## 2019-08-15 NOTE — Telephone Encounter (Signed)
Notes recorded by Frederik Schmidt, RN on 08/15/2019 at 10:49 AM EDT  [email or web address] 10/7  ------

## 2019-08-15 NOTE — Telephone Encounter (Signed)
-----   Message from Tommie Raymond, NP sent at 08/15/2019 10:42 AM EDT ----- Please let the patient know that his potassium level has improved greatly, down to 4.5 which is within normal range.  He needs to have follow-up with his kidney doctor as we discussed during his visit.  His kidney levels remain elevated however about the same as they have been in the past

## 2019-08-16 ENCOUNTER — Telehealth: Payer: Self-pay | Admitting: Cardiology

## 2019-08-16 NOTE — Telephone Encounter (Signed)
Per pt call returning this office call for his test results.  Please give him a call back.

## 2019-08-16 NOTE — Telephone Encounter (Signed)
° ° °  Please return call to patient to discuss lab results

## 2019-08-16 NOTE — Telephone Encounter (Signed)
LMTCB

## 2019-08-16 NOTE — Telephone Encounter (Signed)
The patient has been notified of the result and verbalized understanding.  All questions (if any) were answered. Wilma Flavin, RN 08/16/2019 2:42 PM

## 2019-08-21 ENCOUNTER — Telehealth: Payer: Self-pay

## 2019-08-21 NOTE — Telephone Encounter (Signed)
Pt advised and verbalized understanding of his stress test results... he says he is feeling well and will call us if he develops any chest pain or worsening symptoms.. we went over the cardiac cath instructions and pt will call back if he would like to proceed.

## 2019-08-21 NOTE — Telephone Encounter (Signed)
-----   Message from Tommie Raymond, NP sent at 08/21/2019 10:32 AM EDT ----- Please let the patient know that his stress test showed no ischemia with unchanged squeeze function.  We will continue medical treatment.  If his symptoms worsen, we can consider set up for cardiac catheterization.

## 2019-08-30 NOTE — Progress Notes (Deleted)
Cardiology Office Note   Date:  08/30/2019   ID:  Paul [name], DOB [birth year], MRN [id number]  PCP:  [name], [name], DO  Cardiologist: Dr. Irish Lack, MD   No chief complaint on file.   History of Present Illness: Paul [name] is a 58 y.o. male who presents for follow-up, seen for Dr. Irish Lack.   [name] has a history of CAD status post MI treated with DES to the LAD with staged PCI/DES to the circumflex in 2016. He stopped his medications after 1 month. Also has hypertension CKD stage III and atrial fibrillation s/p AF ablation.   Presented initially [facility] to [facility] in [facility] withdistress, rapid atrial flutter and hypertension. Underwent successful cardioversion with subsequent bradycardia and therefore metoprolol was held.  Echocardiogram 02/10/2018 showed four-chamber enlargement with marked LVH and speckled pattern of LV myocardium and trivial pericardial effusion suggestive of infiltrative disease such as amyloidosis however nuclear medicine tumor localization for cardiac amyloidosis were equivocal. Patient did have Kappa and Lambda light chains in his urine and would need a fat pad biopsy to confirm multiple myeloma. Further testing limited by financial concerns and renal dysfunction. LVEF at that time was 30 to 35% with moderate MR. He was ultimately diuresedin the hospital but was not sent home on diuretics because of renal insufficiency and hypotension. He was told to take them as needed for weight gain of 2 or 3 pounds overnight.  He was then seen in follow-up with [name] PA and was back in atrial flutter with ventricular rates around 130 bpm. Dr. Curt Bears suggested to increase his coreg to 6.125 mg bid.  His BNP at that time was greater than 13,000 and creatinine was 2.56. He was then seen by Dr. Lovena Le 01/2018 and was ultimately set up for an ablation with TEE.  Procedure was performed on 03/13/2018.  TEE at that time showed LVEF of 25 to 30% with diffuse  hypokinesis.  He was then seen by Dr. Lovena Le 09/26/2018 in follow-up.  He had no recurrent palpitations or evidence of atrial flutter.  He was off of his anticoagulation.  He had no symptoms of HF at that time.  [name] then called our office on 06/23/2019 with complaints of what appears to be orthopnea symptoms and lower extremity swelling.  He had no shortness of breath on exertion however son reported dyspnea with ambulation within his house. He was then seen by myself 07/30/2019  in which he reported that he had been taking his PRN Lasix approximately 3 times a week. He had symptom improvement but was not back to his baseline.  We discussed fluid restrictions given his reduced LV function.  Also discussed sodium restriction. Unfortunately, he described symptoms of exertional angina, concerning given his prior history. He was only intermittently taking his ASA.  Plan was for lab work and follow-up echocardiogram. We also discussed follow-up with nephrology as this will be crucial if he needs more regular diuretic therapy given his poor renal function.  Echo was performed 08/14/2019 with an LVEF of 30 to 35% with severe LV hypertrophy and some asymmetry in the septum and anterior walls with diffuse hypokinesis.  There was no LVOT.  Possible infiltrative cardiomyopathy, cannot rule out HCM>>already npted on previous echos. Follow up stress test showed no ischemia with unchanged LVEF.  Plan was to continue with medical treatment.  If symptoms worsen, could consider possible cardiac catheterization.  Today he presents for follow-up,    1.  Likely tachycardia  mediated cardiomyopathy: -Acute on chronic combined systolic and diastolic>> last LV assessment from TEE at AF ablation 03/13/2018 with LVEF noted to be 25 to 30% with severe global reduction in LV systolic function; severe LVH; moderate MR; moderate LAE; mild RVE with severe RV dysfunction; mild RAE; mild TR.  -Cardiomyopathy with 2D echo  suggestive of amyloid heart but not found on NM scan-testing limited at this time because of renal failure. -Will repeat echocardiogram to compare to previous TEE results.  -Obtain BMET and BNP>>>needs to follow with nephrology -Will based diuretic need on BMET, BNP results however does not appear to be overtly fluid volume overloaded on exam -Reports he does not follow fluid restriction>>drinks 1 gallon of water per day>>we discussed proper fluid volume based on his LV function  -Continue carvedilol 3.125 mg -He is tachycardic today>>he is to monitor BP/HR. May need to increase to 6.25 however limited due to low normal BP -No ACE/ARB in the setting of renal insufficiency  2.  Hypertension: -Low normal BP today -May need to increase carvedilol to 6.25   3.  CKD stage IV: -Last creatinine available in epic on 09/12/2018 creatinine, 1.94 -Baseline appears to be in the 1.9- 4.0 range since 2016 -BMET today  -Reinforced need to follow with nephrology   4. CAD status post MI treated with staged DES to the LAD and circumflex 04/2015: -Previously stopped all medications after 1 month with no recurrent anginal symptoms -Reports what sounds like exertional chest tightness more recently. Given his hx, will obtain stress test for further evaluation  -Reports not consistently taking ASA>>reinforced this as well  -Continue ASA, beta-blocker     Past Medical History:  Diagnosis Date   Arthritis    CAD (coronary artery disease)    a. 03/2015 NSTEMI/PCI: LM nl, LAD 95p (3.5x20 Promus DES), D1 60, D2/3 mild dzs, LCX 73m(plan for staged pci), OM1/2 min irregs, RCA 20p, RPDA 60.   Chronic renal insufficiency, stage III (moderate) (HCC) 04/10/2015   CKD (chronic kidney disease), stage III (HCC)    GERD (gastroesophageal reflux disease)    occ   [place]    a. 03/2015 Echo: EF 65-60%, sev LV thickening w/ bright, speckled appearance of IV septum, Gr 2  DD, AoV sclerosis, triv AI, mild MR, LA 761mm^2, RA 4864m^2, aneurysmal IAS, mod TR, PASP 83m36m   Hypertension    denies   PAF (paroxysmal atrial fibrillation) (HCC)[place] remote isolated episode - 09/2012.   TMJ arthralgia     Past Surgical History:  Procedure Laterality Date   A-FLUTTER ABLATION N/A 03/13/2018   Procedure: A-FLUTTER ABLATION;  Surgeon: TaylEvans Lance;  [facility];  Service: Cardiovascular;  Laterality: N/A;   APPENDECTOMY     CARDIAC CATHETERIZATION N/A 03/19/2015   Procedure: Left Heart Cath and Coronary Angiography;  Surgeon: MuhaWellington Hampshire;  [facility];  Service: Cardiovascular;  Laterality: N/A;   CARDIAC CATHETERIZATION  03/19/2015   Procedure: Coronary Stent Intervention;  Surgeon: MuhaWellington Hampshire;  [facility];  Service: Cardiovascular;;   CARDIAC CATHETERIZATION N/A 04/09/2015   Procedure: Coronary Stent Intervention;  Surgeon: MuhaWellington Hampshire;  [facility];  Service: Cardiovascular;  Laterality: N/A;   CARDIOVERSION N/A 02/15/2018   Procedure: CARDIOVERSION;  Surgeon: RandSkeet Latch;  [facility];  Laterality: N/A;   HIP SURGERY Left 74   lft  LAPAROSCOPIC APPENDECTOMY  09/22/2012   Procedure: APPENDECTOMY LAPAROSCOPIC;  Surgeon: Harl Bowie, MD;  Location: [facility];  Service: General;;   LAPAROSCOPY  09/22/2012   Procedure: LAPAROSCOPY DIAGNOSTIC;  Surgeon: Harl Bowie, MD;  Location: [facility];  Service: General;  Laterality: N/A;   TEE WITHOUT CARDIOVERSION N/A 02/15/2018   Procedure: TRANSESOPHAGEAL ECHOCARDIOGRAM (TEE);  Surgeon: [name], MD;  Location: [place];  Service: Cardiovascular;  Laterality: N/A;   TEE WITHOUT CARDIOVERSION N/A 03/13/2018   Procedure: TRANSESOPHAGEAL ECHOCARDIOGRAM (TEE);  Surgeon: Lelon Perla, MD;  [place];  Service: Cardiovascular;   Laterality: N/A;   TOTAL HIP ARTHROPLASTY Right 03,01   x2   TOTAL HIP ARTHROPLASTY WITH HARDWARE REMOVAL Left 08/05/2014   Procedure: TOTAL HIP ARTHROPLASTY WITH HARDWARE REMOVAL;  Surgeon: Kerin Salen, MD;  Location: [place];  Service: Orthopedics;  Laterality: Left;     Current Outpatient Medications  Medication Sig Dispense Refill   aspirin 81 MG tablet Take 1 tablet (81 mg total) by mouth daily. 90 tablet 3   carvedilol (COREG) 3.125 MG tablet Take 1 tablet (3.125 mg total) by mouth 2 (two) times daily. 180 tablet 3   fluticasone (FLONASE) 50 MCG/ACT nasal spray Place 2 sprays into both nostrils daily. 16 g 6   furosemide (LASIX) 40 MG tablet Take one tablet daily for 3 days.  Then take one tablet by mouth as needed for swelling or shortness of breath at night. 90 tablet 3   loratadine (CLARITIN) 10 MG tablet Take 1 tablet (10 mg total) by mouth daily. 90 tablet 3   sildenafil (VIAGRA) 100 MG tablet TAKE 1 TABLET BY MOUTH DAILY FOR ERECTILE DYSFUNCTION (30 MINUTES TO 4 HOURS BEFORE SEX). 90 tablet 0   traMADol (ULTRAM) 50 MG tablet TAKE 1 TABLET BY MOUTH EVERY 12 HOURS AS NEEDED FOR MODERATE PAIN 60 tablet 0   No current facility-administered medications for this visit.     Allergies:   Nsaids and Ibuprofen    Social History:  The patient  reports that he has been smoking cigarettes. He has a 2.50 pack-year smoking history. He has never used smokeless tobacco. He reports current alcohol use of about 2.0 standard drinks of alcohol per week. He reports that he does not use drugs.   Family History:  The patient's ***family history includes Heart disease in his father and mother.    ROS:  Please see the history of present illness.   Otherwise, review of systems are positive for {NONE DEFAULTED:18576::"none"}.   All other systems are reviewed and negative.    PHYSICAL EXAM: VS:  There were no vitals taken for this visit. , BMI There is no height or weight on file to calculate  BMI. GEN: Well nourished, well developed, in no acute distress HEENT: normal Neck: no JVD, carotid bruits, or masses Cardiac: ***RRR; no murmurs, rubs, or gallops,no edema  Respiratory:  clear to auscultation bilaterally, normal work of breathing GI: soft, nontender, nondistended, + BS MS: no deformity or atrophy Skin: warm and dry, no rash Neuro:  Strength and sensation are intact Psych: euthymic mood, full affect   EKG:  EKG {ACTION; IS/IS UYQ:[id number]} ordered today. The ekg ordered today demonstrates ***   Recent Labs: 07/30/2019: NT-Pro BNP 9,273 08/14/2019: BUN 39; Creatinine, Ser 2.21; Potassium 4.5; Sodium 141    Lipid Panel    Component Value Date/Time   CHOL 159 07/13/2017 1001   TRIG 72 07/13/2017 1001   HDL 56 07/13/2017 1001  CHOLHDL 2.8 07/13/2017 1001   CHOLHDL 2.9 09/28/2016 1641   VLDL 21 09/28/2016 1641   LDLCALC 89 07/13/2017 1001   LDLDIRECT 111 (H) 03/31/2007 2119      Wt Readings from Last 3 Encounters:  08/14/19 192 lb (87.1 kg)  07/30/19 192 lb 1.9 oz (87.1 kg)  10/24/18 177 lb (80.3 kg)      Other studies Reviewed: Additional studies/ records that were reviewed today include:   Nuclear scan for amyloidosis 02/2018 IMPRESSION: Visual and quantitative assessment (grade 0 to 1, H/CLL equal 1.17) are equivocal for transthyretin amyloidosis.   Electronically Signed By: Kerby Moors M.D. On: 02/16/2018 16:55  Cardiac catheterization 2016  Conclusion    Prox RCA lesion, 20% stenosed.  RPDA lesion, 60% stenosed.  1st Diag lesion, 60% stenosed.  Mid Cx lesion, 90% stenosed. There is a 0% residual stenosis post intervention.  A drug-eluting stent was placed.  1. Patent LAD stent  2. Successful angioplasty and drug-eluting stent placement to the mid left circumflex.  Recommendations: Hydrated overnight, aggressive medical therapy. Dual antiplatelets therapy for at least 1 years.    ECHO: 02/10/2018 - Left  ventricle: There is marked hypertrophy of the interventricular septum as well as the papillary muscles with a speckled pattern to the myocardium suggestion possible amyloidosis. The cavity size was severely dilated. There was moderate concentric and severe asymmetric hypertrophy. Systolic function was moderately to severely reduced. The estimated ejection fraction was in the range of 30% to 35%. Moderate diffuse hypokinesis with distinct regional wall motion abnormalities. There is akinesis of the basal-midinferolateral myocardium. The study is not technically sufficient to allow evaluation of LV diastolic function. - Ventricular septum: The contour showed mild diastolic flattening and mild systolic flattening. These changes are consistent with RV volume and pressure overload. - Aortic valve: Trileaflet; moderately thickened, moderately calcified leaflets. - Mitral valve: There was moderate regurgitation. - Left atrium: The atrium was massively dilated. - Right ventricle: The cavity size was severely dilated. Wall thickness was normal. Systolic function was moderately reduced. - Right atrium: The atrium was massively dilated. - Tricuspid valve: There was moderate regurgitation. - Pulmonic valve: There was trivial regurgitation. - Pulmonary arteries: PA peak pressure: 47 mm Hg (S). - Pericardium, extracardiac: A trivial, free-flowing pericardial effusion was identified along the right atrial free wall. The fluid had no internal echoes. - Impressions: 4 chamber enlargement with marked LVH and speckled pattern of LV myocardium as well as trivial pericardial effusion suggestive of infiltrative disease such as amyloidosis. Compared to prior echo, LV and RV function have significantly declined. Recommend cardiac MRI.  Impressions:  - 4 chamber enlargement with marked LVH and speckled pattern of LV myocardium as well as trivial pericardial  effusion suggestive of infiltrative disease such as amyloidosis. Compared to prior echo, LV and RV function have significantly declined. Recommend cardiac MRI. The right ventricular systolic pressure was increased consistent with moderate pulmonary hypertension.   TEE 03/13/2018:  - Left ventricle: Wall thickness was increased in a pattern of severe LVH. Systolic function was severely reduced. The estimated ejection fraction was in the range of 25% to 30%. Diffuse hypokinesis. - Aortic valve: No evidence of vegetation. - Mitral valve: No evidence of vegetation. There was moderate regurgitation. - Left atrium: The atrium was moderately dilated. No evidence of thrombus in the atrial cavity or appendage. - Right ventricle: The cavity size was mildly dilated. Systolic function was severely reduced. - Right atrium: The atrium was mildly dilated. - Atrial septum: No defect  or patent foramen ovale was identified. - Tricuspid valve: No evidence of vegetation. - Pulmonic valve: No evidence of vegetation. - Pericardium, extracardiac: A trivial pericardial effusion was identified.  Impressions:  - Severe global reduction in LV systolic function; severe LVH; moderate MR; moderate LAE; mild RVE with severe RV dysfunction; mild RAE; mild TR.    ASSESSMENT AND PLAN:  1.  ***   Current medicines are reviewed at length with the patient today.  The patient {ACTIONS; HAS/DOES NOT HAVE:19233} concerns regarding medicines.  The following changes have been made:  {PLAN; NO CHANGE:13088:s}  Labs/ tests ordered today include: *** No orders of the defined types were placed in this encounter.    Disposition:   FU with *** in {gen number 5-99:357017} {Days to years:10300}  Signed, [name], NP  08/30/2019 11:32 AM    [facility] [address], [place], [place]  [address] Phone: [phone]; Fax: [phone]

## 2019-09-06 ENCOUNTER — Ambulatory Visit: Payer: Medicare HMO | Admitting: Cardiology

## 2019-09-11 ENCOUNTER — Ambulatory Visit: Payer: Medicare HMO | Admitting: Internal Medicine

## 2019-09-19 IMAGING — NM NM TUMOR LOCAL/TRACER DISTR WITH SPECT
4 series · 19 of 19 positions shown · non-contrast
Comparison: none

CLINICAL DATA: HEART FAILURE. CONCERN FOR CARDIAC AMYLOIDOSIS.

EXAM:
NUCLEAR MEDICINE TUMOR LOCALIZATION. PYP CARDIAC AMYLOIDOSIS SCAN
WITH SPECT
TECHNIQUE: Following intravenous administration of radiopharmaceutical,
anterior planar images of the chest were obtained. Regions of
interest were placed on the heart and contralateral chest wall for
quantitative assessment. Additional SPECT imaging of the chest was
obtained.
RADIOPHARMACEUTICALS:  21.2 mCi TECHNETIUM 99 PYROPHOSPHATE

[Series 1: spect - (id)_(id)_cor · 2.1mm · 2.07mm/px · 6 of 256 frames shown]
[frame 22/256]
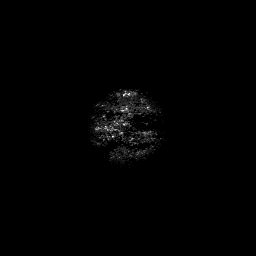
[frame 64/256]
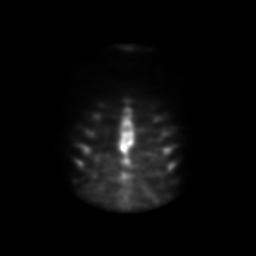
[frame 107/256]
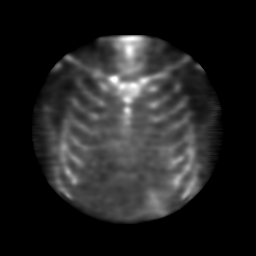
[frame 150/256]
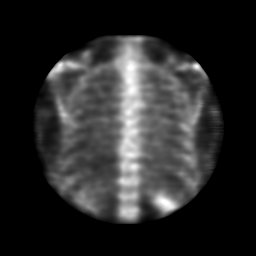
[frame 192/256]
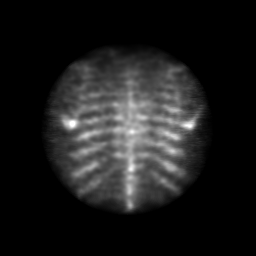
[frame 235/256]
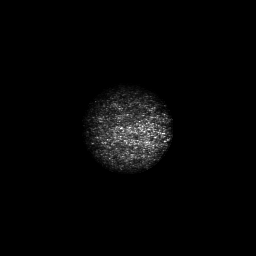

[Series 1: ant · 4.14mm/px · 1 of 1 slices shown]
[im 1/1]
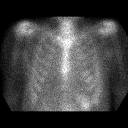

[Series 1: spect - (id)_(id)_tra · 2.1mm · 2.07mm/px · 6 of 256 frames shown]
[frame 22/256]
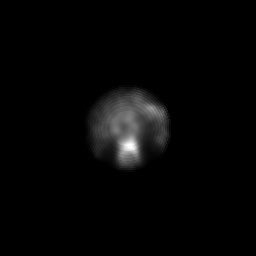
[frame 64/256]
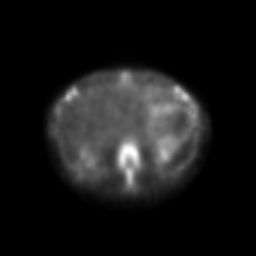
[frame 107/256]
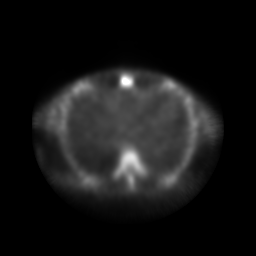
[frame 150/256]
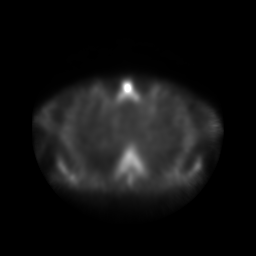
[frame 192/256]
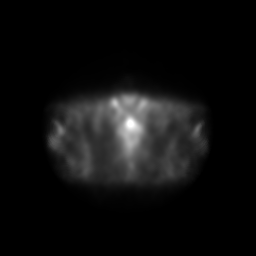
[frame 235/256]
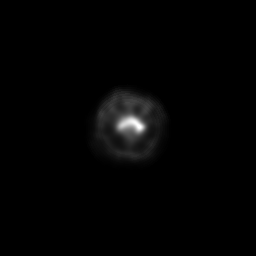

[Series 2: amyloid · 2.07mm/px · 6 of 64 frames shown]
[frame 6/64  full-range]
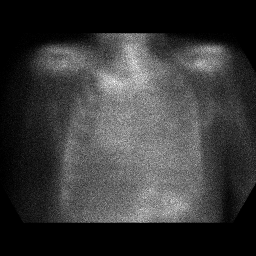
[frame 16/64  full-range]
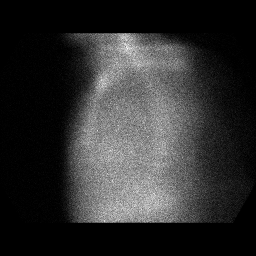
[frame 27/64  full-range]
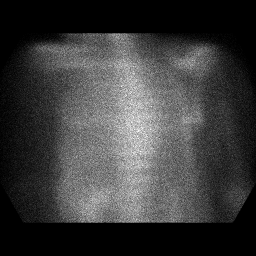
[frame 38/64  full-range]
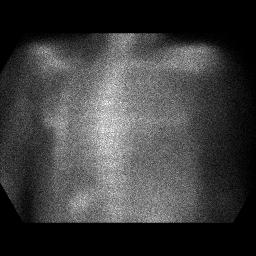
[frame 48/64  full-range]
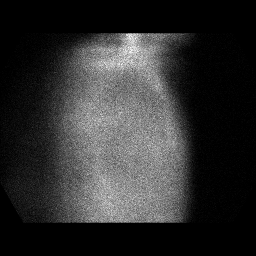
[frame 59/64  full-range]
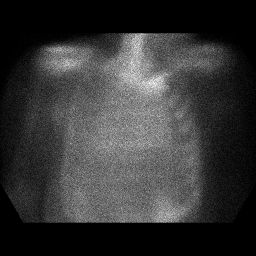

[19 of 19 positions shown; findings below may reference images not displayed]

FINDINGS: Planar Visual assessment:

Anterior planar imaging demonstrates radiotracer uptake within the
heart less than than uptake within the adjacent ribs (Grade 0).

Quantitative assessment :

Quantitative assessment of the cardiac uptake compared to the
contralateral chest wall is equal to (H/CL = 1.17).

SPECT assessment: SPECT imaging of the chest demonstrates uptake
less than rib uptake radiotracer accumulation within the LEFT
ventricle.
IMPRESSION: Visual and quantitative assessment (grade 0 to 1, H/CLL equal 1.17)
are equivocal for transthyretin amyloidosis.

## 2019-10-10 ENCOUNTER — Ambulatory Visit (HOSPITAL_COMMUNITY)
Admission: RE | Admit: 2019-10-10 | Discharge: 2019-10-10 | Disposition: A | Discharge status: Home / Self Care | Payer: Medicare HMO | Source: Ambulatory Visit | Attending: Family Medicine | Admitting: Family Medicine

## 2019-10-10 ENCOUNTER — Encounter: Payer: Self-pay | Admitting: Family Medicine

## 2019-10-10 ENCOUNTER — Ambulatory Visit (INDEPENDENT_AMBULATORY_CARE_PROVIDER_SITE_OTHER): Payer: Medicare HMO | Admitting: Family Medicine

## 2019-10-10 ENCOUNTER — Other Ambulatory Visit: Payer: Self-pay | Admitting: *Deleted

## 2019-10-10 ENCOUNTER — Other Ambulatory Visit: Payer: Self-pay

## 2019-10-10 VITALS — BP 112/70 | HR 72 | Wt 193.0 lb

## 2019-10-10 DIAGNOSIS — I4892 Unspecified atrial flutter: Secondary | ICD-10-CM | POA: Diagnosis not present

## 2019-10-10 DIAGNOSIS — Z23 Encounter for immunization: Secondary | ICD-10-CM

## 2019-10-10 DIAGNOSIS — R0602 Shortness of breath: Secondary | ICD-10-CM

## 2019-10-10 DIAGNOSIS — K649 Unspecified hemorrhoids: Secondary | ICD-10-CM

## 2019-10-10 HISTORY — DX: Unspecified hemorrhoids: K64.9

## 2019-10-10 NOTE — Assessment & Plan Note (Signed)
History of ablation and had been in NSR at previous visits.  EKG is concerning for a fib with RVR given rate of 127 and p waves are not clear.  He is not currently anticoagulated.  Given this and symptoms of likely CHF exacerbation, have recommended that patient is taken to ED for further evaluation.

## 2019-10-10 NOTE — Assessment & Plan Note (Signed)
Given other acute concerns that are more pressing, will defer to follow up at another visit.

## 2019-10-10 NOTE — Progress Notes (Signed)
Subjective: No chief complaint on file.    HPI: Paul [name] is a 58 y.o. presenting to clinic today to discuss the following:  1 Hemorrhoids States that he has been using some hemorrhoid cream (Preparation H) for a little while, finds the pain that is very irritable.  Feels like the size is getting smaller.  Has been using some hot towel too and trying not to strain as much.  Was using a lot of pepto bismol.  Denies hematochezia.  Isn't sure if it is a hemorrhoid.    2 Shortness of breath Patient very short of breath during conversation.  States it has been worse since this weekend.  Was up more this weekend and doesn't get out much.  States that he has had more swelling in his legs, taking Lasix 40mg  QD, was told to increase if gains weight.  Has not been checking his weight.  Was told by cardiologist a few months ago and was told to double Lasix and it improved symptoms, but is having symptoms again in the last few days.  Endorses some orthopnea.  Denies chest pain.  Cannot tell me his dry weight, thinks his weight is up a little and ate a lot this weekend, including salty foods.  Thinks his breathing now might be a little worse than a few months ago.  Coughs at night.  No fevers.  No productive cough.     ROS noted in HPI. Chief complaint noted.  Other Pertinent PMH: Hx CAD s/p stent 2016, HFrEF EF 30-35% (last Echo 08/2019), HOCM, A flutter with RVR s/p ablation not on anticoagulation Past Medical, Surgical, Social, and Family History Reviewed & Updated per EMR.      Social History   Tobacco Use  Smoking Status Light Tobacco Smoker  . Packs/day: 0.25  . Years: 10.00  . Pack years: 2.50  . Types: Cigarettes  Smokeless Tobacco Never Used   Smoking status noted.    Objective: BP 112/70   Pulse 72   Wt 193 lb (87.5 kg)   SpO2 97%   BMI 26.92 kg/m  Vitals and nursing notes reviewed  Physical Exam:  General: 58 y.o. male dyspnic, speaking in short phrases and  taking deep breaths, labored Cardio: irregularly irregular about 120 bmp Lungs: Crackles L>R from lower to mid lung Skin: warm and dry Extremities: 1+ pitting edema BLE to shins  EKG: Rate 127 bpm, p waves not clear before QRS, concern for atrial fibrillation      No results found for this or any previous visit (from the past 72 hour(s)).  Assessment/Plan:  Shortness of breath Patient very SOB on exam, although sats appropriate while resting.  Appears fluid overloaded with pitting edema, crackles on lung exam, reported weight gain, and orthopnea.  Patient has HFrEF with EF 30-35% (08/14/2019) and EKG concerning for A fib with RVR.  Given this, have recommended that patient present to ED for further evaluation including possible rate control, anticoagulation, and diuresis.  Discussed with patient and multiple family members of the phone recommendation for ED follow up and explained risk of stroke, fluid overload, and ultimately that this could lead to death if not treated.  All voiced understanding.  Patient noted that he would still like to think about this.  AMA paperwork signed.  Patient was escorted to car by CMA and again advised to go directly to the ED.  Atrial flutter with rapid ventricular response (HCC) History of ablation and had been in  NSR at previous visits.  EKG is concerning for a fib with RVR given rate of 127 and p waves are not clear.  He is not currently anticoagulated.  Given this and symptoms of likely CHF exacerbation, have recommended that patient is taken to ED for further evaluation.  Hemorrhoids Given other acute concerns that are more pressing, will defer to follow up at another visit.       PATIENT EDUCATION PROVIDED: See AVS    Diagnosis and plan along with any newly prescribed medication(s) were discussed in detail with this patient today. The patient verbalized understanding and agreed with the plan. Patient advised if symptoms worsen return to clinic or  ER.   Health Maintainance: flu shot given today   Orders Placed This Encounter  Procedures  . Flu Vaccine QUAD 36+ mos IM  . EKG 12-Lead    No orders of the defined types were placed in this encounter.    [place] Constable, DO 10/10/2019, 5:50 PM PGY-2 [place]

## 2019-10-10 NOTE — Patient Instructions (Signed)
Patient left prior to AVS being printed, signed AMA papers.

## 2019-10-10 NOTE — Assessment & Plan Note (Addendum)
Patient very SOB on exam, although sats appropriate while resting.  Appears fluid overloaded with pitting edema, crackles on lung exam, reported weight gain, and orthopnea.  Patient has HFrEF with EF 30-35% (08/14/2019) and EKG concerning for A fib with RVR.  Given this, have recommended that patient present to ED for further evaluation including possible rate control, anticoagulation, and diuresis.  Discussed with patient and multiple family members of the phone recommendation for ED follow up and explained risk of stroke, fluid overload, and ultimately that this could lead to death if not treated.  All voiced understanding.  Patient noted that he would still like to think about this.  AMA paperwork signed.  Patient was escorted to car by CMA and again advised to go directly to the ED.

## 2019-10-16 ENCOUNTER — Inpatient Hospital Stay (HOSPITAL_COMMUNITY)
Admission: EM | Admit: 2019-10-16 | Discharge: 2019-11-09 | DRG: 291 | Disposition: E | Payer: Medicare HMO | Attending: Pulmonary Disease | Admitting: Pulmonary Disease

## 2019-10-16 ENCOUNTER — Other Ambulatory Visit: Payer: Self-pay

## 2019-10-16 ENCOUNTER — Emergency Department (HOSPITAL_COMMUNITY): Payer: Medicare HMO

## 2019-10-16 ENCOUNTER — Telehealth: Payer: Self-pay | Admitting: Internal Medicine

## 2019-10-16 ENCOUNTER — Encounter (HOSPITAL_COMMUNITY): Payer: Self-pay | Admitting: Emergency Medicine

## 2019-10-16 DIAGNOSIS — E8809 Other disorders of plasma-protein metabolism, not elsewhere classified: Secondary | ICD-10-CM | POA: Diagnosis present

## 2019-10-16 DIAGNOSIS — I5082 Biventricular heart failure: Secondary | ICD-10-CM | POA: Diagnosis present

## 2019-10-16 DIAGNOSIS — E778 Other disorders of glycoprotein metabolism: Secondary | ICD-10-CM | POA: Diagnosis present

## 2019-10-16 DIAGNOSIS — I5043 Acute on chronic combined systolic (congestive) and diastolic (congestive) heart failure: Secondary | ICD-10-CM | POA: Diagnosis present

## 2019-10-16 DIAGNOSIS — Z781 Physical restraint status: Secondary | ICD-10-CM

## 2019-10-16 DIAGNOSIS — I4891 Unspecified atrial fibrillation: Secondary | ICD-10-CM

## 2019-10-16 DIAGNOSIS — Z9911 Dependence on respirator [ventilator] status: Secondary | ICD-10-CM | POA: Diagnosis not present

## 2019-10-16 DIAGNOSIS — T501X5A Adverse effect of loop [high-ceiling] diuretics, initial encounter: Secondary | ICD-10-CM | POA: Diagnosis present

## 2019-10-16 DIAGNOSIS — D696 Thrombocytopenia, unspecified: Secondary | ICD-10-CM | POA: Diagnosis not present

## 2019-10-16 DIAGNOSIS — I130 Hypertensive heart and chronic kidney disease with heart failure and stage 1 through stage 4 chronic kidney disease, or unspecified chronic kidney disease: Principal | ICD-10-CM | POA: Diagnosis present

## 2019-10-16 DIAGNOSIS — R451 Restlessness and agitation: Secondary | ICD-10-CM | POA: Diagnosis not present

## 2019-10-16 DIAGNOSIS — I428 Other cardiomyopathies: Secondary | ICD-10-CM | POA: Diagnosis present

## 2019-10-16 DIAGNOSIS — U071 COVID-19: Secondary | ICD-10-CM | POA: Diagnosis present

## 2019-10-16 DIAGNOSIS — J80 Acute respiratory distress syndrome: Secondary | ICD-10-CM | POA: Diagnosis not present

## 2019-10-16 DIAGNOSIS — R578 Other shock: Secondary | ICD-10-CM | POA: Diagnosis not present

## 2019-10-16 DIAGNOSIS — G894 Chronic pain syndrome: Secondary | ICD-10-CM | POA: Diagnosis present

## 2019-10-16 DIAGNOSIS — R197 Diarrhea, unspecified: Secondary | ICD-10-CM | POA: Diagnosis present

## 2019-10-16 DIAGNOSIS — R570 Cardiogenic shock: Secondary | ICD-10-CM | POA: Diagnosis not present

## 2019-10-16 DIAGNOSIS — Z4659 Encounter for fitting and adjustment of other gastrointestinal appliance and device: Secondary | ICD-10-CM

## 2019-10-16 DIAGNOSIS — Z79899 Other long term (current) drug therapy: Secondary | ICD-10-CM

## 2019-10-16 DIAGNOSIS — I252 Old myocardial infarction: Secondary | ICD-10-CM

## 2019-10-16 DIAGNOSIS — I468 Cardiac arrest due to other underlying condition: Secondary | ICD-10-CM | POA: Diagnosis not present

## 2019-10-16 DIAGNOSIS — D509 Iron deficiency anemia, unspecified: Secondary | ICD-10-CM | POA: Diagnosis present

## 2019-10-16 DIAGNOSIS — E785 Hyperlipidemia, unspecified: Secondary | ICD-10-CM | POA: Diagnosis present

## 2019-10-16 DIAGNOSIS — Z9049 Acquired absence of other specified parts of digestive tract: Secondary | ICD-10-CM

## 2019-10-16 DIAGNOSIS — I482 Chronic atrial fibrillation, unspecified: Secondary | ICD-10-CM | POA: Diagnosis present

## 2019-10-16 DIAGNOSIS — I5031 Acute diastolic (congestive) heart failure: Secondary | ICD-10-CM | POA: Diagnosis not present

## 2019-10-16 DIAGNOSIS — N189 Chronic kidney disease, unspecified: Secondary | ICD-10-CM | POA: Diagnosis present

## 2019-10-16 DIAGNOSIS — K645 Perianal venous thrombosis: Secondary | ICD-10-CM | POA: Diagnosis present

## 2019-10-16 DIAGNOSIS — R0602 Shortness of breath: Secondary | ICD-10-CM | POA: Diagnosis present

## 2019-10-16 DIAGNOSIS — N179 Acute kidney failure, unspecified: Secondary | ICD-10-CM | POA: Diagnosis present

## 2019-10-16 DIAGNOSIS — Z96643 Presence of artificial hip joint, bilateral: Secondary | ICD-10-CM | POA: Diagnosis present

## 2019-10-16 DIAGNOSIS — I2510 Atherosclerotic heart disease of native coronary artery without angina pectoris: Secondary | ICD-10-CM | POA: Diagnosis present

## 2019-10-16 DIAGNOSIS — I421 Obstructive hypertrophic cardiomyopathy: Secondary | ICD-10-CM | POA: Diagnosis present

## 2019-10-16 DIAGNOSIS — A4189 Other specified sepsis: Secondary | ICD-10-CM | POA: Diagnosis not present

## 2019-10-16 DIAGNOSIS — K7290 Hepatic failure, unspecified without coma: Secondary | ICD-10-CM | POA: Diagnosis not present

## 2019-10-16 DIAGNOSIS — J1289 Other viral pneumonia: Secondary | ICD-10-CM | POA: Diagnosis not present

## 2019-10-16 DIAGNOSIS — Z452 Encounter for adjustment and management of vascular access device: Secondary | ICD-10-CM

## 2019-10-16 DIAGNOSIS — R34 Anuria and oliguria: Secondary | ICD-10-CM | POA: Diagnosis not present

## 2019-10-16 DIAGNOSIS — E875 Hyperkalemia: Secondary | ICD-10-CM | POA: Diagnosis not present

## 2019-10-16 DIAGNOSIS — I361 Nonrheumatic tricuspid (valve) insufficiency: Secondary | ICD-10-CM | POA: Diagnosis not present

## 2019-10-16 DIAGNOSIS — Z8249 Family history of ischemic heart disease and other diseases of the circulatory system: Secondary | ICD-10-CM

## 2019-10-16 DIAGNOSIS — Z955 Presence of coronary angioplasty implant and graft: Secondary | ICD-10-CM

## 2019-10-16 DIAGNOSIS — R6520 Severe sepsis without septic shock: Secondary | ICD-10-CM | POA: Diagnosis not present

## 2019-10-16 DIAGNOSIS — R188 Other ascites: Secondary | ICD-10-CM | POA: Diagnosis present

## 2019-10-16 DIAGNOSIS — E874 Mixed disorder of acid-base balance: Secondary | ICD-10-CM | POA: Diagnosis not present

## 2019-10-16 DIAGNOSIS — N184 Chronic kidney disease, stage 4 (severe): Secondary | ICD-10-CM | POA: Diagnosis present

## 2019-10-16 DIAGNOSIS — Z886 Allergy status to analgesic agent status: Secondary | ICD-10-CM

## 2019-10-16 DIAGNOSIS — F17210 Nicotine dependence, cigarettes, uncomplicated: Secondary | ICD-10-CM | POA: Diagnosis present

## 2019-10-16 DIAGNOSIS — F419 Anxiety disorder, unspecified: Secondary | ICD-10-CM | POA: Diagnosis present

## 2019-10-16 DIAGNOSIS — J9601 Acute respiratory failure with hypoxia: Secondary | ICD-10-CM

## 2019-10-16 DIAGNOSIS — I119 Hypertensive heart disease without heart failure: Secondary | ICD-10-CM | POA: Diagnosis present

## 2019-10-16 DIAGNOSIS — I340 Nonrheumatic mitral (valve) insufficiency: Secondary | ICD-10-CM | POA: Diagnosis not present

## 2019-10-16 DIAGNOSIS — I255 Ischemic cardiomyopathy: Secondary | ICD-10-CM | POA: Diagnosis present

## 2019-10-16 DIAGNOSIS — Z978 Presence of other specified devices: Secondary | ICD-10-CM

## 2019-10-16 DIAGNOSIS — Z972 Presence of dental prosthetic device (complete) (partial): Secondary | ICD-10-CM

## 2019-10-16 DIAGNOSIS — R069 Unspecified abnormalities of breathing: Secondary | ICD-10-CM

## 2019-10-16 DIAGNOSIS — K922 Gastrointestinal hemorrhage, unspecified: Secondary | ICD-10-CM | POA: Diagnosis not present

## 2019-10-16 DIAGNOSIS — K0889 Other specified disorders of teeth and supporting structures: Secondary | ICD-10-CM | POA: Diagnosis present

## 2019-10-16 DIAGNOSIS — I083 Combined rheumatic disorders of mitral, aortic and tricuspid valves: Secondary | ICD-10-CM | POA: Diagnosis present

## 2019-10-16 DIAGNOSIS — J302 Other seasonal allergic rhinitis: Secondary | ICD-10-CM | POA: Diagnosis present

## 2019-10-16 DIAGNOSIS — Z7982 Long term (current) use of aspirin: Secondary | ICD-10-CM

## 2019-10-16 DIAGNOSIS — Z79891 Long term (current) use of opiate analgesic: Secondary | ICD-10-CM

## 2019-10-16 DIAGNOSIS — E162 Hypoglycemia, unspecified: Secondary | ICD-10-CM | POA: Diagnosis not present

## 2019-10-16 DIAGNOSIS — R579 Shock, unspecified: Secondary | ICD-10-CM

## 2019-10-16 DIAGNOSIS — Z9119 Patient's noncompliance with other medical treatment and regimen: Secondary | ICD-10-CM

## 2019-10-16 LAB — BASIC METABOLIC PANEL
Anion gap: 9 (ref 5–15)
BUN: 46 mg/dL — ABNORMAL HIGH (ref 6–20)
CO2: 22 mmol/L (ref 22–32)
Calcium: 8.5 mg/dL — ABNORMAL LOW (ref 8.9–10.3)
Chloride: 111 mmol/L (ref 98–111)
Creatinine, Ser: 2.84 mg/dL — ABNORMAL HIGH (ref 0.61–1.24)
GFR calc Af Amer: 27 mL/min — ABNORMAL LOW (ref >60)
GFR calc non Af Amer: 23 mL/min — ABNORMAL LOW (ref >60)
Glucose, Bld: 156 mg/dL — ABNORMAL HIGH (ref 70–99)
Potassium: 4.7 mmol/L (ref 3.5–5.1)
Sodium: 142 mmol/L (ref 135–145)

## 2019-10-16 LAB — CBC
HCT: 41.4 % (ref 39.0–52.0)
Hemoglobin: 12.7 g/dL — ABNORMAL LOW (ref 13.0–17.0)
MCH: 24.8 pg — ABNORMAL LOW (ref 26.0–34.0)
MCHC: 30.7 g/dL (ref 30.0–36.0)
MCV: 80.7 fL (ref 80.0–100.0)
Platelets: 189 10*3/uL (ref 150–400)
RBC: 5.13 MIL/uL (ref 4.22–5.81)
RDW: 15.2 % (ref 11.5–15.5)
WBC: 4.5 10*3/uL (ref 4.0–10.5)
nRBC: 0.7 % — ABNORMAL HIGH (ref 0.0–0.2)

## 2019-10-16 LAB — TROPONIN I (HIGH SENSITIVITY): Troponin I (High Sensitivity): 433 ng/L (ref <18)

## 2019-10-16 MED ORDER — SODIUM CHLORIDE 0.9% FLUSH: 3.0000 mL | Freq: Once | INTRAVENOUS | Status: DC

## 2019-10-16 MED ORDER — MIDAZOLAM HCL 2 MG/2ML IJ SOLN: 2.0000 mg | Freq: Once | INTRAMUSCULAR | Status: DC

## 2019-10-16 MED ORDER — HEPARIN (PORCINE) 25000 UT/250ML-% IV SOLN
1150.0000 [IU]/h | INTRAVENOUS | Status: DC
  Administered 2019-10-17: 1350 [IU]/h via INTRAVENOUS
  Administered 2019-10-17: 1200 [IU]/h via INTRAVENOUS
  Administered 2019-10-19 – 2019-10-20 (×2): 1300 [IU]/h via INTRAVENOUS
  Administered 2019-10-21 – 2019-10-22 (×2): 1150 [IU]/h via INTRAVENOUS
  Filled 2019-10-16 (×7): qty 250

## 2019-10-16 MED ORDER — HEPARIN BOLUS VIA INFUSION
5500.0000 [IU] | Freq: Once | INTRAVENOUS | Status: AC
  Administered 2019-10-17: 5500 [IU] via INTRAVENOUS
  Filled 2019-10-16: qty 5500

## 2019-10-16 MED ORDER — FENTANYL CITRATE (PF) 100 MCG/2ML IJ SOLN: 100.0000 ug | Freq: Once | INTRAMUSCULAR | Status: DC

## 2019-10-16 MED ORDER — DILTIAZEM HCL-DEXTROSE 125-5 MG/125ML-% IV SOLN (PREMIX)
5.0000 mg/h | INTRAVENOUS | Status: DC
  Administered 2019-10-16: 5 mg/h via INTRAVENOUS
  Filled 2019-10-16: qty 125

## 2019-10-16 MED ORDER — FUROSEMIDE 10 MG/ML IJ SOLN
40.0000 mg | Freq: Once | INTRAMUSCULAR | Status: AC
  Administered 2019-10-17: 40 mg via INTRAVENOUS
  Filled 2019-10-16: qty 4

## 2019-10-16 MED ORDER — METOPROLOL TARTRATE 25 MG PO TABS
12.5000 mg | ORAL_TABLET | Freq: Four times a day (QID) | ORAL | Status: DC
  Administered 2019-10-17: 12.5 mg via ORAL
  Filled 2019-10-16: qty 1

## 2019-10-16 MED ORDER — DILTIAZEM LOAD VIA INFUSION
10.0000 mg | Freq: Once | INTRAVENOUS | Status: AC
  Administered 2019-10-16: 10 mg via INTRAVENOUS
  Filled 2019-10-16: qty 10

## 2019-10-16 MED ORDER — ASPIRIN EC 81 MG PO TBEC
81.0000 mg | DELAYED_RELEASE_TABLET | Freq: Every day | ORAL | Status: DC
  Administered 2019-10-17: 81 mg via ORAL
  Filled 2019-10-16: qty 1

## 2019-10-16 NOTE — H&P (Addendum)
[facility] Admission History and Physical Service Pager: [phone]  Patient name: Paul [name] Medical record number: [id number] Date of birth: [birth year] Age: 58 y.o. Gender: male  Primary Care Provider: [name], [name], DO Consultants: Cardiology  Code Status: Full Preferred Emergency Contact: Eliezer Mccoy (nephew who is Family Med Dr)   Chief Complaint: Shortness of breath   Assessment and Plan: Paul [name] is a 58 y.o. male presenting with shortness of breath and edema. PMH is significant for the CHF, CAD s/p LAD DES in 5/ 2016, hypertension, PAF, hyperlipidemia, CKD 4, tobacco use and arthritis  Acute on chronic CHF, A fib with RVR Pt has PMH of CHF, Afib and CAD and presents with 5 day hx of SOB, edema and fatigue. Top differentials include CHF exacerbation and Afib RVR. On examination, 3+ pitting edema bilaterally and basal to midzone crackles with increased work of breathing and wheeze which is likely cardiac in origin. CXR shows cardiomegaly with pulmonary edema. Previous echo in 10/06 shows EF 30-35%. Considered ACS as pt had recent chest pain on exertion. EKG shows no ischemic changes. Troponin elevated to 433 likely secondary to demand ischemia. Considered PE as pt tachycardic with Afib, wells score 1.5. Subacute presentation and pt not hypoxic so less likely. Also considered COVID given recent positive exposure and pt reports recent cough. Pt is afebrile, WBC 4.5, Pending COVID test. Pt in Afib with RVR, takes low dose Carvedilol but not on anticoagulation and has CHADs2Vasc 3. Pt responded to initial Diltiazem however switched to metoprolol per cardiology. Patient follows with Dr. Irish Lack as primary cardiologist and has seen Dr. Lovena Le. After speaking with Cardiology will keep NPO at midnight on chance patient needs cath in AM. -Admit to telemetry FPTS, Attending Dr McDiarmid, appropriate for inpatient -Vitals per routine -Continuous pulse  oximetry and telemetry -Cardiology following, appreciate recommendations       -Discontinue Diltiazem drip and start Metoprolol 12.5mg  QID, titrate as necessary      -Consider amyloidosis workup      -No indication for cardioversion at present      -Continue IV Lasix 40mg , titrate dosage based on 12/9 cmp -Heparin drip per pharm consult, CHADs2Vasc 3 -Daily weights -Strict Is and Os -Mg am -COVID PCR test - Check TSH  CAD/ACS rule out  Denies chest pain Trop 433 today, EKG  Afib with RVR, no ischemic changes S/p stent 2016, patent LAD stent. Echo in 08/14/2019 showed EF 30-35% Home meds: Aspirin 81mg , Carvedilol 3.125mg  BID  -Continue home Aspirin -Hold home Carvediliol -Metoprolol 12.5mg  QID, titrate as needed, per Cardiology recs -Continue ACS rule out, trend Troponins until flat   Hypertension BPs soft in ED, 620-355H systolic Home meds: Carvedilol 3.125mg  BID, Lasix 40mg  once daily -Hold Carvedilol -IV Lasix per cardiology  AKI on CKD Stage IV Cr 2.84 today ,Baseline Cr ~1.8 -Avoid nephrotoxic agents -AM CMP -Continue to monitor while diuresing - check UA  Scleral icterus  Obvious scleral icterus on examination, pt denies noticing this. Diffuse tenderness on abdominal exam. No guarding or rebound tenderness, no obvious organomegaly. High pitched bowel sounds   -CMP am -Could consider work up for cirrhosis and RUQ [place] based on CMP   Ix for Amyloidosis Amyloidosis considered as cause for CHF exacerbation  -Ix per cardiology recs  Tobacco use disorder  -Encourage cessation -Offered nicotine patch  Seasonal allergies Loratadine 10mg  once daily,  Flonase spray  -continue home meds  Chronic pain 2/2 hip replacements  Takes Tramadol at  home '50mg'$  BID  -Hold for now    FEN/GI: NPO sips with meds Prophylaxis: heparin gtt  Disposition: Admit to telemetry  History of Present Illness:  Paul [name] is a 58 y.o. male presenting with SOB and general  malaise.  Went [facility] on [facility]-5 days [facility] for hemorrhoids. Had an EKG showing Afib for SOB and chest pain. Dr Hinda Glatter recommended that patient went to the emergency department at that time but he felt like he didn't need to go.   Has had shortness of breath over the last couple of days and states that he has also been "retaining fluid" a little worse recently. He endorses dyspnea on exertion, while walking to car~approx 8 feet and when getting groceries. He had to lean over a counter and catch his breath. Also had some tightness in his chest but does not endorse any sharp pain. Reports orthopnea and unable to sleep flat at night. Has to sit in a chair and can only sleep in a bed for short period of time due to the shortness of breath. States that he is "used to his heart racing" and does not feel palpitations right now or dizziness.   Has been having diarrhea for the last weeks "every few hours. Stools have been black at times as takes Peptobismal for reflux. Denies BRBPR.  Patient went to family gathering at [employer] and was exposed to his niece who was COVID positive. Unsure whether other family members have tested positive.   On questioning, we noticed pt's eyes were jaundiced. Pt denies scleral icterus and says "my eyes are always like this".   Denies loss anosmia or ageusia, nausea, vomiting, headache, no vision changes or cough. Denies dysuria or hematuria or fevers.  Review Of Systems: Per HPI with the following additions:  Review of Systems  Constitutional: Negative for chills and fever.  HENT: Negative for congestion and sinus pain.        No loss of taste and smell  Respiratory: Positive for shortness of breath. Negative for cough and wheezing.   Cardiovascular: Negative for chest pain.  Gastrointestinal: Positive for diarrhea. Negative for abdominal pain, blood in stool, constipation, nausea and vomiting.  Genitourinary: Negative for dysuria and urgency.  Musculoskeletal:  Negative for myalgias and neck pain.  Neurological: Positive for headaches. Negative for dizziness.    Patient Active Problem List   Diagnosis Date Noted  . Atrial fibrillation with rapid ventricular response ([place]) 11/06/2019  . Hemorrhoids 10/10/2019  . Pacemaker infection ([place]) 03/13/2018  . Atrial flutter with rapid ventricular response ([place]) 03/13/2018  . Chronic atrial fibrillation ([place]) 02/27/2018  . History of cocaine use 02/18/2018  . Elevated LFTs   . Shortness of breath 02/09/2018  . CHF exacerbation ([place]) 02/09/2018  . Typical atrial flutter ([place]) 02/09/2018  . Chronic kidney disease (CKD), stage IV (severe) ([place]) 07/17/2017  . Chronic pain syndrome 05/31/2016  . Osteoarthritis, knee 05/31/2016  . CAD - S/P LAD DES May 2016, staged CFX DES June 2016 04/08/2015  . Chronic combined systolic and diastolic CHF (congestive heart failure) ([place]) 03/27/2015  . Stented coronary artery   . Microcytic anemia 03/19/2015  . 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 NSTEMI (non-ST elevated myocardial infarction) ([place]) 03/18/2015  . Acute diastolic CHF (congestive heart failure), NYHA class 4 (HCC) 03/18/2015    Class: Acute  . Essential hypertension 03/18/2015  . Lumbar strain 11/12/2014  . Arthritis of left hip 08/05/2014  . 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 Sinus tachycardia 02/06/2013  . S/P hip replacement 12/25/2012  . 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 Hypertensive cardiomyopathy ([place]) 09/24/2012  . Bilateral hip pain 09/05/2012  . Other, mixed, or unspecified nondependent drug abuse, unspecified 12/03/2008  . Hypertrophic obstructive cardiomyopathy ([place]) 08/07/2008  . ERECTILE DYSFUNCTION 04/23/2008  . GERD 02/21/2008  . PAF- last in 2013 02/17/2008  . BACK PAIN 10/11/2007  . Hyperlipidemia 05/05/2007  . TOBACCO USE 03/31/2007  . Renal failure 01/05/2007    Past Medical  History: Past Medical History:  Diagnosis Date  . Arthritis   . CAD (coronary artery disease)    a. 03/2015 NSTEMI/PCI: LM nl, LAD 95p (3.5x20 Promus DES), D1 60, D2/3 mild dzs, LCX 79m (plan for staged pci), OM1/2 min irregs, RCA 20p, RPDA 60.  Marland Kitchen Chronic renal insufficiency, stage III (moderate) 04/10/2015  . CKD (chronic kidney disease), stage III   . GERD (gastroesophageal reflux disease)    occ  . HOCM (hypertrophic obstructive cardiomyopathy) ([place])    a. 03/2015 Echo: EF 65-60%, sev LV thickening w/ bright, speckled appearance of IV septum, Gr 2 DD, AoV sclerosis, triv AI, mild MR, LA 31ml/m^2, RA 37ml/m^2, aneurysmal IAS, mod TR, PASP 88mmHg.  Marland Kitchen Hypertension    denies  . PAF (paroxysmal atrial fibrillation) ([place])    remote isolated episode - 09/2012.  . TMJ arthralgia     Past Surgical History: Past Surgical History:  Procedure Laterality Date  . A-FLUTTER ABLATION N/A 03/13/2018   Procedure: A-FLUTTER ABLATION;  Surgeon: Evans Lance, MD;  Location: [place] [facility];  Service: Cardiovascular;  Laterality: N/A;  . APPENDECTOMY    . CARDIAC CATHETERIZATION N/A 03/19/2015   Procedure: Left Heart Cath and Coronary Angiography;  Surgeon: [name], MD;  Location: [place] [facility];  Service: Cardiovascular;  Laterality: N/A;  . CARDIAC CATHETERIZATION  03/19/2015   Procedure: Coronary Stent Intervention;  Surgeon: [name], MD;  Location: [place] [facility];  Service: Cardiovascular;;  . CARDIAC CATHETERIZATION N/A 04/09/2015   Procedure: Coronary Stent Intervention;  Surgeon: [name], MD;  Location: [place] [facility];  Service: Cardiovascular;  Laterality: N/A;  . CARDIOVERSION N/A 02/15/2018   Procedure: CARDIOVERSION;  Surgeon: [name], MD;  Location: [place] [facility];  Service: Cardiovascular;  Laterality: N/A;  . HIP SURGERY Left 74   lft   . LAPAROSCOPIC APPENDECTOMY  09/22/2012   Procedure: APPENDECTOMY LAPAROSCOPIC;  Surgeon: Harl Bowie,  MD;  Location: [facility];  Service: General;;  . LAPAROSCOPY  09/22/2012   Procedure: LAPAROSCOPY DIAGNOSTIC;  Surgeon: Harl Bowie, MD;  Location: [facility];  Service: General;  Laterality: N/A;  . TEE WITHOUT CARDIOVERSION N/A 02/15/2018   Procedure: TRANSESOPHAGEAL ECHOCARDIOGRAM (TEE);  Surgeon: [name], MD;  Location: [place];  Service: Cardiovascular;  Laterality: N/A;  . TEE WITHOUT CARDIOVERSION N/A 03/13/2018   Procedure: TRANSESOPHAGEAL ECHOCARDIOGRAM (TEE);  Surgeon: Lelon Perla, MD;  [facility];  Service: Cardiovascular;  Laterality: N/A;  . TOTAL HIP ARTHROPLASTY Right 03,01   x2  . TOTAL HIP ARTHROPLASTY WITH HARDWARE REMOVAL Left 08/05/2014   Procedure: TOTAL HIP ARTHROPLASTY WITH HARDWARE REMOVAL;  Surgeon: Kerin Salen, MD;  Location: [place] [facility];  Service: Orthopedics;  Laterality: Left;    Social History: Social History   Tobacco Use  . Smoking status: Light Tobacco Smoker    Packs/day: 0.25    Years: 10.00  Pack years: 2.50    Types: Cigarettes  . Smokeless tobacco: Never Used  Substance Use Topics  . Alcohol use: Yes    Alcohol/week: 2.0 standard drinks    Types: 2 Cans of beer per week    Comment: occasional  . Drug use: No   Additional social history:  Please also refer to relevant sections of EMR.  Family History: Family History  Problem Relation Age of Onset  . Heart disease Mother   . Heart disease Father     Allergies and Medications: Allergies  Allergen Reactions  . 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 Ibuprofen Other (See Comments)    Kidney damage   No current facility-administered medications on file prior to encounter.    Current Outpatient Medications on File Prior to Encounter  Medication Sig Dispense Refill  . aspirin 81 MG tablet Take 1 tablet (81 mg total) by mouth daily. 90 tablet 3  . carvedilol (COREG) 3.125 MG tablet Take 1 tablet (3.125 mg total) by mouth 2 (two) times  daily. 180 tablet 3  . fluticasone (FLONASE) 50 MCG/ACT nasal spray Place 2 sprays into both nostrils daily. 16 g 6  . furosemide (LASIX) 40 MG tablet Take one tablet daily for 3 days.  Then take one tablet by mouth as needed for swelling or shortness of breath at night. 90 tablet 3  . loratadine (CLARITIN) 10 MG tablet Take 1 tablet (10 mg total) by mouth daily. 90 tablet 3  . sildenafil (VIAGRA) 100 MG tablet TAKE 1 TABLET BY MOUTH DAILY FOR ERECTILE DYSFUNCTION (30 MINUTES TO 4 HOURS BEFORE SEX). 90 tablet 0  . traMADol (ULTRAM) 50 MG tablet TAKE 1 TABLET BY MOUTH EVERY 12 HOURS AS NEEDED FOR MODERATE PAIN 60 tablet 0    Objective: BP 102/77   Pulse (!) 105   Temp 97.6 F (36.4 C) (Oral)   Resp (!) 27   Ht $R'5\' 11"'UC$  (1.803 m)   Wt 87.5 kg   SpO2 95%   BMI 26.92 kg/m   Exam: General: alert, cooperative, mild-moderate respiratory distress Eyes: normal EOM, scleral icterus, moist MM  ENTM: Poor dentition, upper dentures, no thyromegaly, no lymphadenopathy  Neck: neck supple, normal movement Cardiovascular: S1 and S2 present, irregular pulse, tachycardic, no rubs or gallops, normal cap refill  Respiratory: bilateral crackles base to mid-zones and prolonged expiratory phase with wheeze. Increased work of breathing  Gastrointestinal: abdomen diffusely tender, no guarding, bowel sounds present, no obvious organomegaly Periphery: bilateral 3+ pitting edema past shins MSK: moving all 4 limbs equally Derm: warm and dry  Neuro: cranial nerves grossly in tact Psych: normal mood, normal affect  Labs and Imaging: CBC BMET  Recent Labs  Lab 10/26/2019 1749  WBC 4.5  HGB 12.7*  HCT 41.4  PLT 189   Recent Labs  Lab 10/27/2019 1749  NA 142  K 4.7  CL 111  CO2 22  BUN 46*  CREATININE 2.84*  GLUCOSE 156*  CALCIUM 8.5*     Dg Chest 2 View  Result Date: 10/25/2019 CLINICAL DATA:  Acute chest pain and shortness of breath. EXAM: CHEST - 2 VIEW COMPARISON:  02/13/2018 and prior studies  FINDINGS: Cardiomegaly, pulmonary vascular congestion and mild interstitial opacities noted. There is no evidence of focal airspace disease, suspicious pulmonary nodule/mass, pleural effusion, or pneumothorax. No acute bony abnormalities are identified. IMPRESSION: 1. Cardiomegaly with pulmonary vascular congestion and probable mild interstitial edema. 2. No evidence of focal pneumonia.  Electronically Signed   By: Margarette Canada M.D.   On: 11/02/2019 18:23    EKG: Afib with RVR  Lattie Haw, MD 10/20/2019, 11:31 PM PGY-1, [facility] pager: [phone], text pages welcome -------------------------------------------------------------------------------------------------------- Upper Level Addendum: I have seen and evaluated this patient along with Dr. Posey Pronto and reviewed the above note, making necessary revisions in blue.  Guadalupe Dawn MD PGY-3 Family Medicine Resident

## 2019-10-16 NOTE — Progress Notes (Signed)
Family medicine progress note  Paged by Dr. Marletta Lor, cardiology fellow, to discuss case.  Felt that based off of patient's previous echo findings metoprolol would be a better choice than the current diltiazem drip.  Recommended that once he gets into the 110s heart rate, transitioning to metoprolol every 6 hours.  Patient persistently between 110 and 115 during my examination. No plans for intervention tonight and unlikely for any planned intervention, such as cardioversion, tomorrow given patient's volume overloaded status.  Will start on oral metoprolol 12.5 mg every 6 hours.  Will dose adjust as needed.  Patient likely needs to have significant amount of fluid taken off prior to cardioversion.  This is obviously complicated by patient's renal function, so have to tread carefully in this area.  Discussed starting heparin drip given patient's lack of any observable contraindication to anticoagulation and his renal function.  Cardiology in agreement that this was beneficial for patient, will start heparin drip dosing per pharmacy.  Full H&P to follow, appreciate cardiology recommendations and their help with this patient.  Guadalupe Dawn MD PGY-3 Family Medicine Resident

## 2019-10-16 NOTE — ED Triage Notes (Signed)
Pt states he has been feeling SOB, restless, not sleeping well. Went to cardiologist last week and is in Afib. Pt put off coming to ED. Pt states he continues to feel SOB and is gaining fluid.

## 2019-10-16 NOTE — ED Provider Notes (Signed)
[facility] EMERGENCY DEPARTMENT Provider Note   CSN: [id number] Arrival date & time: 10/17/2019  1731     History   Chief Complaint No chief complaint on file.   HPI Paul [name] is a 58 y.o. male.     HPI Patient with multiple medical issues including CAD, CHF, CKD and A. fib, not currently anticoagulated presents with 1 week of dyspnea, fatigue. Patient denies notable precipitant, but now over the course of the week, after onset he has had progressive difficulty with ADL, ambulation secondary to dyspnea, fatigue. No syncope, chest pain, abdominal [place], no fever. He states he has been taking his medication as directed, including furosemide. He did initially have swelling in his lower extremities, though this is improved with this medication. He has spoken with his primary care team, and was encouraged to come for evaluation 6 days ago, but positive he would improve.  On however, he has worsened, rather than improved, and now presents for evaluation.  Past Medical History:  Diagnosis Date  . Arthritis   . CAD (coronary artery disease)    a. 03/2015 NSTEMI/PCI: LM nl, LAD 95p (3.5x20 Promus DES), D1 60, D2/3 mild dzs, LCX 37m (plan for staged pci), OM1/2 min irregs, RCA 20p, RPDA 60.  Marland Kitchen Chronic renal insufficiency, stage III (moderate) 04/10/2015  . CKD (chronic kidney disease), stage III   . GERD (gastroesophageal reflux disease)    occ  . HOCM (hypertrophic obstructive cardiomyopathy) ([place])    a. 03/2015 Echo: EF 65-60%, sev LV thickening w/ bright, speckled appearance of IV septum, Gr 2 DD, AoV sclerosis, triv AI, mild MR, LA 51ml/m^2, RA 98ml/m^2, aneurysmal IAS, mod TR, PASP 9mmHg.  Marland Kitchen Hypertension    denies  . PAF (paroxysmal atrial fibrillation) ([place])    remote isolated episode - 09/2012.  . TMJ arthralgia     Patient Active Problem List   Diagnosis Date Noted  . Hemorrhoids 10/10/2019  . Pacemaker infection ([place]) 03/13/2018  . Atrial  flutter with rapid ventricular response ([place]) 03/13/2018  . Chronic atrial fibrillation ([place]) 02/27/2018  . History of cocaine use 02/18/2018  . Elevated LFTs   . Shortness of breath 02/09/2018  . CHF exacerbation ([place]) 02/09/2018  . Typical atrial flutter ([place]) 02/09/2018  . Chronic kidney disease (CKD), stage IV (severe) ([place]) 07/17/2017  . Chronic pain syndrome 05/31/2016  . Osteoarthritis, knee 05/31/2016  . CAD - S/P LAD DES May 2016, staged CFX DES June 2016 04/08/2015  . Chronic combined systolic and diastolic CHF (congestive heart failure) ([place]) 03/27/2015  . Stented coronary artery   . Microcytic anemia 03/19/2015  . 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 NSTEMI (non-ST elevated myocardial infarction) ([place]) 03/18/2015  . Acute diastolic CHF (congestive heart failure), NYHA class 4 (HCC) 03/18/2015    Class: Acute  . Essential hypertension 03/18/2015  . Lumbar strain 11/12/2014  . Arthritis of left hip 08/05/2014  . 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 Sinus tachycardia 02/06/2013  . S/P hip replacement 12/25/2012  . 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 Hypertensive cardiomyopathy ([place]) 09/24/2012  . Bilateral hip pain 09/05/2012  . Other, mixed, or unspecified nondependent drug abuse, unspecified 12/03/2008  . Hypertrophic obstructive cardiomyopathy ([place]) 08/07/2008  . ERECTILE DYSFUNCTION 04/23/2008  . GERD 02/21/2008  . PAF- last in 2013 02/17/2008  . BACK PAIN 10/11/2007  . Hyperlipidemia 05/05/2007  . TOBACCO USE 03/31/2007  . Renal failure 01/05/2007  Past Surgical History:  Procedure Laterality Date  . A-FLUTTER ABLATION N/A 03/13/2018   Procedure: A-FLUTTER ABLATION;  Surgeon: Evans Lance, MD;  Location: [facility];  Service: Cardiovascular;  Laterality: N/A;  . APPENDECTOMY    . CARDIAC CATHETERIZATION N/A 03/19/2015   Procedure: Left Heart Cath and Coronary  Angiography;  Surgeon: [name], MD;  Location: [facility];  Service: Cardiovascular;  Laterality: N/A;  . CARDIAC CATHETERIZATION  03/19/2015   Procedure: Coronary Stent Intervention;  Surgeon: [name], MD;  Location: [facility];  Service: Cardiovascular;;  . CARDIAC CATHETERIZATION N/A 04/09/2015   Procedure: Coronary Stent Intervention;  Surgeon: [name], MD;  Location: [facility];  Service: Cardiovascular;  Laterality: N/A;  . CARDIOVERSION N/A 02/15/2018   Procedure: CARDIOVERSION;  Surgeon: [name], MD;  Location: [facility];  Service: Cardiovascular;  Laterality: N/A;  . HIP SURGERY Left 74   lft   . LAPAROSCOPIC APPENDECTOMY  09/22/2012   Procedure: APPENDECTOMY LAPAROSCOPIC;  Surgeon: Harl Bowie, MD;  Location: [facility];  Service: General;;  . LAPAROSCOPY  09/22/2012   Procedure: LAPAROSCOPY DIAGNOSTIC;  Surgeon: Harl Bowie, MD;  Location: [facility];  Service: General;  Laterality: N/A;  . TEE WITHOUT CARDIOVERSION N/A 02/15/2018   Procedure: TRANSESOPHAGEAL ECHOCARDIOGRAM (TEE);  Surgeon: [name], MD;  Location: [facility];  Service: Cardiovascular;  Laterality: N/A;  . TEE WITHOUT CARDIOVERSION N/A 03/13/2018   Procedure: TRANSESOPHAGEAL ECHOCARDIOGRAM (TEE);  Surgeon: Lelon Perla, MD;  Location[facility];  Service: Cardiovascular;  Laterality: N/A;  . TOTAL HIP ARTHROPLASTY Right 03,01   x2  . TOTAL HIP ARTHROPLASTY WITH HARDWARE REMOVAL Left 08/05/2014   Procedure: TOTAL HIP ARTHROPLASTY WITH HARDWARE REMOVAL;  Surgeon: Kerin Salen, MD;  Location: [facility];  Service: Orthopedics;  Laterality: Left;        Home Medications    Prior to Admission medications   Medication Sig Start Date End Date Taking? Authorizing Provider  aspirin 81 MG tablet Take 1 tablet (81 mg total) by mouth daily. 09/12/18   [name], [name], DO  carvedilol (COREG) 3.125 MG tablet Take 1 tablet (3.125 mg total) by mouth  2 (two) times daily. 02/08/19 07/30/19  Evans Lance, MD  fluticasone (FLONASE) 50 MCG/ACT nasal spray Place 2 sprays into both nostrils daily. 10/24/18   [name], [name], DO  furosemide (LASIX) 40 MG tablet Take one tablet daily for 3 days.  Then take one tablet by mouth as needed for swelling or shortness of breath at night. 02/08/19   Evans Lance, MD  loratadine (CLARITIN) 10 MG tablet Take 1 tablet (10 mg total) by mouth daily. 10/24/18   [name], [name], DO  sildenafil (VIAGRA) 100 MG tablet TAKE 1 TABLET BY MOUTH DAILY FOR ERECTILE DYSFUNCTION (30 MINUTES TO 4 HOURS BEFORE SEX). 07/26/19   [name], [name], DO  traMADol (ULTRAM) 50 MG tablet TAKE 1 TABLET BY MOUTH EVERY 12 HOURS AS NEEDED FOR MODERATE PAIN 11/16/18   [name], [name], DO    Family History Family History  Problem Relation Age of Onset  . Heart disease Mother   . Heart disease Father     Social History Social History   Tobacco Use  . Smoking status: Light Tobacco Smoker    Packs/day: 0.25    Years: 10.00    Pack years: 2.50    Types: Cigarettes  . Smokeless tobacco: Never Used  Substance Use Topics  . Alcohol use:  Yes    Alcohol/week: 2.0 standard drinks    Types: 2 Cans of beer per week    Comment: occasional  . Drug use: No     Allergies   Nsaids and Ibuprofen   Review of Systems Review of Systems  Constitutional:       Per HPI, otherwise negative  HENT:       Per HPI, otherwise negative  Respiratory:       Per HPI, otherwise negative  Cardiovascular:       Per HPI, otherwise negative  Gastrointestinal: Negative for vomiting.  Endocrine:       Negative aside from HPI  Genitourinary:       Neg aside from HPI   Musculoskeletal:       Per HPI, otherwise negative  Skin: Negative.   Neurological: Positive for weakness. Negative for syncope.     Physical Exam Updated Vital Signs BP (!) 113/97   Pulse (!) 121   Temp 97.6 F (36.4 C) (Oral)   Resp 20   SpO2 96%   Physical Exam Vitals  signs and nursing note reviewed.  Constitutional:      General: He is not in acute distress.    Appearance: He is well-developed.  HENT:     Head: Normocephalic and atraumatic.  Eyes:     Conjunctiva/sclera: Conjunctivae normal.  Cardiovascular:     Rate and Rhythm: Tachycardia present. Rhythm irregular.  Pulmonary:     Effort: Pulmonary effort is normal. No respiratory distress.     Breath sounds: No stridor.  Abdominal:     General: There is no distension.  Skin:    General: Skin is warm and dry.  Neurological:     Mental Status: He is alert and oriented to person, place, and time.      ED Treatments / Results  Labs (all labs ordered are listed, but only abnormal results are displayed) Labs Reviewed  BASIC METABOLIC PANEL - Abnormal; Notable for the following components:      Result Value   Glucose, Bld 156 (*)    BUN 46 (*)    Creatinine, Ser 2.84 (*)    Calcium 8.5 (*)    GFR calc non Af Amer 23 (*)    GFR calc Af Amer 27 (*)    All other components within normal limits  CBC - Abnormal; Notable for the following components:   Hemoglobin 12.7 (*)    MCH 24.8 (*)    nRBC 0.7 (*)    All other components within normal limits  TROPONIN I (HIGH SENSITIVITY) - Abnormal; Notable for the following components:   Troponin I (High Sensitivity) 433 (*)    All other components within normal limits  BRAIN NATRIURETIC PEPTIDE  HEPARIN LEVEL (UNFRACTIONATED)  CBC  POC SARS CORONAVIRUS 2 AG -  ED  TROPONIN I (HIGH SENSITIVITY)    EKG EKG Interpretation  Date/Time:  Tuesday October 16 2019 17:41:46 EST Ventricular Rate:  130 PR Interval:    QRS Duration: 90 QT Interval:  340 QTC Calculation: 500 R Axis:   -71 Text Interpretation: Atrial fibrillation with rapid ventricular response Left ventricular hypertrophy Non-specific ST-t changes No significant change since last tracing Confirmed by Lajean Saver [id number]) on 11/07/2019 5:49:01 PM   Radiology Dg Chest 2 View   Result Date: 10/19/2019 CLINICAL DATA:  Acute chest pain and shortness of breath. EXAM: CHEST - 2 VIEW COMPARISON:  02/13/2018 and prior studies FINDINGS: Cardiomegaly, pulmonary vascular congestion and mild interstitial opacities  noted. There is no evidence of focal airspace disease, suspicious pulmonary nodule/mass, pleural effusion, or pneumothorax. No acute bony abnormalities are identified. IMPRESSION: 1. Cardiomegaly with pulmonary vascular congestion and probable mild interstitial edema. 2. No evidence of focal pneumonia. Electronically Signed   By: Margarette Canada M.D.   On: 11/03/2019 18:23    Procedures Procedures (including critical care time)  CRITICAL CARE Performed by: Carmin Muskrat Total critical care time: 40 minutes Critical care time was exclusive of separately billable procedures and treating other patients. Critical care was necessary to treat or prevent imminent or life-threatening deterioration. Critical care was time spent personally by me on the following activities: development of treatment plan with patient and/or surrogate as well as nursing, discussions with consultants, evaluation of patient's response to treatment, examination of patient, obtaining history from patient or surrogate, ordering and performing treatments and interventions, ordering and review of laboratory studies, ordering and review of radiographic studies, pulse oximetry and re-evaluation of patient's condition.  Medications Ordered in ED Medications  sodium chloride flush (NS) 0.9 % injection 3 mL (has no administration in time range)  diltiazem (CARDIZEM) 1 mg/mL load via infusion 10 mg (has no administration in time range)    And  diltiazem (CARDIZEM) 125 mg in dextrose 5% 125 mL (1 mg/mL) infusion (has no administration in time range)     Initial Impression / Assessment and Plan / ED Course  I have reviewed the triage vital signs and the nursing notes.  Pertinent labs & imaging results that  were available during my care of the patient were reviewed by me and considered in my medical decision making (see chart for details).    Patient atrial fibrillation with rapid ventricular response on initial evaluation.  He does have a history of A. fib, is not anticoagulated given the duration of symptoms, he is not a candidate for electrical cardioversion. Patient started on Cardizem bolus, drip.    11:16 PM I discussed the patient's case with our cardiology colleagues, in addition to his primary care team. Patient now has substantially improved/reduced tachycardia, is awake, alert, in no distress. Patient is on Cardizem drip, but we will try to transition from Cardizem to metoprolol.  On he is also received IV Lasix, with diuresis.  This elderly male with history of A. fib, CAD, CHF, CKD presents with dyspnea, fatigue and was found to be in A. fib, RVR Patient not a candidate for electrical cardioversion, but did improve substantially with Cardizem bolus, drip, IV Lasix. Patient required admission after discussion with her cardiology colleagues.  Final Clinical Impressions(s) / ED Diagnoses   Final diagnoses:  Atrial fibrillation with RVR ([place])     Carmin Muskrat, MD 10/11/2019 2318

## 2019-10-16 NOTE — Telephone Encounter (Signed)
New Message    Pt c/o Shortness Of Breath: STAT if SOB developed within the last 24 hours or pt is noticeably SOB on the phone  1. Are you currently SOB (can you hear that pt is SOB on the phone)? Yes now, can slightly hear it on phone   2. How long have you been experiencing SOB? Started a couple of months ago but it is increasing   3. Are you SOB when sitting or when up moving around? Bending over to put shoes on, walked out to his car last night and was really SOB. He says it happens when he is moving around   4. Are you currently experiencing any other symptoms? Last Wednesday he was told to go to the emergency room for his breathing by a nurse, And he did not go

## 2019-10-16 NOTE — Telephone Encounter (Signed)
Called and spoke to patient. He states that he has had increasing SOB for the past month with LEE. He states SOB is at rest and worsens with activity. Patient is audibly SOB on the phone and only able to get a couple of words out at a time. He not been weighing himself. States that he takes lasix 40 mg prn. He states that he took 80 mg x 2 days with little improvement. Patient was seen by PCP on 12/2 and was advised to go to the ER and did not. Made patient aware that he needs to go to the ER. Patient agrees and states that he will get his neighbor to take him.

## 2019-10-16 NOTE — Progress Notes (Signed)
ANTICOAGULATION CONSULT NOTE - Initial Consult  Pharmacy Consult for Heparin  Indication: atrial fibrillation  Allergies  Allergen Reactions  . 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 Ibuprofen Other (See Comments)    Kidney damage    Patient Measurements: Height: 5\' 11"  (180.3 cm) Weight: 193 lb (87.5 kg) IBW/kg (Calculated) : 75.3 Heparin Dosing Weight: 87.5 kg  Vital Signs: Temp: 97.6 F (36.4 C) (12/08 1927) Temp Source: Oral (12/08 1927) BP: 102/77 (12/08 2200) Pulse Rate: 105 (12/08 2200)  Labs: Recent Labs    10/17/2019 1749  HGB 12.7*  HCT 41.4  PLT 189  CREATININE 2.84*  TROPONINIHS 433*    Estimated Creatinine Clearance: 29.5 mL/min (A) (by C-G formula based on SCr of 2.84 mg/dL (H)).   Medical History: Past Medical History:  Diagnosis Date  . Arthritis   . CAD (coronary artery disease)    a. 03/2015 NSTEMI/PCI: LM nl, LAD 95p (3.5x20 Promus DES), D1 60, D2/3 mild dzs, LCX 53m (plan for staged pci), OM1/2 min irregs, RCA 20p, RPDA 60.  Marland Kitchen Chronic renal insufficiency, stage III (moderate) 04/10/2015  . CKD (chronic kidney disease), stage III   . GERD (gastroesophageal reflux disease)    occ  . HOCM (hypertrophic obstructive cardiomyopathy) ([place])    a. 03/2015 Echo: EF 65-60%, sev LV thickening w/ bright, speckled appearance of IV septum, Gr 2 DD, AoV sclerosis, triv AI, mild MR, LA 69ml/m^2, RA 35ml/m^2, aneurysmal IAS, mod TR, PASP 24mmHg.  Marland Kitchen Hypertension    denies  . PAF (paroxysmal atrial fibrillation) ([place])    remote isolated episode - 09/2012.  . TMJ arthralgia     Medications:  Scheduled:  . [START ON 10/17/2019] aspirin EC  81 mg Oral Daily  . furosemide  40 mg Intravenous Once  . heparin  5,500 Units Intravenous Once  . sodium chloride flush  3 mL Intravenous Once    Assessment: Patient is a 76 yom that presented to the Ed in A-fib w/ RVR. The patient was rate controlled on a Diltiazem drip. Patient's CHA2DS2-VAsc  score is > 2. With this pharmacy has been asked to dose heparin at this time.   Goal of Therapy:  Heparin level 0.3-0.7 units/ml Monitor platelets by anticoagulation protocol: Yes   Plan:  - Heparin bolus of 5500 units IV x 1 dose  - Followed by Heparin Drip @ 1350 units/hr  - Check Heparin level in 8 hours  - Monitor for s/s of bleeding and CBC daily while on heparin.  -  F/u with long term anticoagulation plans as closer to discharge  Duanne Limerick PharmD. BCPS  11/08/2019,11:03 PM

## 2019-10-17 ENCOUNTER — Encounter (HOSPITAL_COMMUNITY): Payer: Self-pay | Admitting: Family Medicine

## 2019-10-17 DIAGNOSIS — I4891 Unspecified atrial fibrillation: Secondary | ICD-10-CM

## 2019-10-17 DIAGNOSIS — E8809 Other disorders of plasma-protein metabolism, not elsewhere classified: Secondary | ICD-10-CM | POA: Diagnosis present

## 2019-10-17 DIAGNOSIS — D509 Iron deficiency anemia, unspecified: Secondary | ICD-10-CM

## 2019-10-17 DIAGNOSIS — N179 Acute kidney failure, unspecified: Secondary | ICD-10-CM

## 2019-10-17 DIAGNOSIS — K645 Perianal venous thrombosis: Secondary | ICD-10-CM

## 2019-10-17 DIAGNOSIS — D696 Thrombocytopenia, unspecified: Secondary | ICD-10-CM | POA: Diagnosis present

## 2019-10-17 DIAGNOSIS — E778 Other disorders of glycoprotein metabolism: Secondary | ICD-10-CM

## 2019-10-17 DIAGNOSIS — N189 Chronic kidney disease, unspecified: Secondary | ICD-10-CM | POA: Diagnosis present

## 2019-10-17 DIAGNOSIS — I5031 Acute diastolic (congestive) heart failure: Secondary | ICD-10-CM

## 2019-10-17 DIAGNOSIS — I5082 Biventricular heart failure: Secondary | ICD-10-CM

## 2019-10-17 HISTORY — DX: Perianal venous thrombosis: K64.5

## 2019-10-17 HISTORY — DX: Other disorders of bilirubin metabolism: E80.6

## 2019-10-17 HISTORY — DX: Biventricular heart failure: I50.82

## 2019-10-17 HISTORY — DX: Other disorders of glycoprotein metabolism: E77.8

## 2019-10-17 LAB — URINALYSIS, ROUTINE W REFLEX MICROSCOPIC
Bilirubin Urine: NEGATIVE
Glucose, UA: NEGATIVE mg/dL
Hgb urine dipstick: NEGATIVE
Ketones, ur: NEGATIVE mg/dL
Leukocytes,Ua: NEGATIVE
Nitrite: NEGATIVE
Protein, ur: NEGATIVE mg/dL
Specific Gravity, Urine: 1.006 (ref 1.005–1.030)
pH: 5 (ref 5.0–8.0)

## 2019-10-17 LAB — COMPREHENSIVE METABOLIC PANEL
ALT: 28 U/L (ref 0–44)
AST: 32 U/L (ref 15–41)
Albumin: 2.7 g/dL — ABNORMAL LOW (ref 3.5–5.0)
Alkaline Phosphatase: 65 U/L (ref 38–126)
Anion gap: 13 (ref 5–15)
BUN: 45 mg/dL — ABNORMAL HIGH (ref 6–20)
CO2: 19 mmol/L — ABNORMAL LOW (ref 22–32)
Calcium: 8.1 mg/dL — ABNORMAL LOW (ref 8.9–10.3)
Chloride: 111 mmol/L (ref 98–111)
Creatinine, Ser: 2.65 mg/dL — ABNORMAL HIGH (ref 0.61–1.24)
GFR calc Af Amer: 29 mL/min — ABNORMAL LOW (ref >60)
GFR calc non Af Amer: 25 mL/min — ABNORMAL LOW (ref >60)
Glucose, Bld: 137 mg/dL — ABNORMAL HIGH (ref 70–99)
Potassium: 3.9 mmol/L (ref 3.5–5.1)
Sodium: 143 mmol/L (ref 135–145)
Total Bilirubin: 2.3 mg/dL — ABNORMAL HIGH (ref 0.3–1.2)
Total Protein: 5.5 g/dL — ABNORMAL LOW (ref 6.5–8.1)

## 2019-10-17 LAB — HEPARIN LEVEL (UNFRACTIONATED)
Heparin Unfractionated: 0.87 IU/mL — ABNORMAL HIGH (ref 0.30–0.70)
Heparin Unfractionated: 0.26 IU/mL — ABNORMAL LOW (ref 0.30–0.70)

## 2019-10-17 LAB — MAGNESIUM: Magnesium: 1.6 mg/dL — ABNORMAL LOW (ref 1.7–2.4)

## 2019-10-17 LAB — CBC
HCT: 35.5 % — ABNORMAL LOW (ref 39.0–52.0)
Hemoglobin: 10.9 g/dL — ABNORMAL LOW (ref 13.0–17.0)
MCH: 24.6 pg — ABNORMAL LOW (ref 26.0–34.0)
MCHC: 30.7 g/dL (ref 30.0–36.0)
MCV: 80.1 fL (ref 80.0–100.0)
Platelets: 146 10*3/uL — ABNORMAL LOW (ref 150–400)
RBC: 4.43 MIL/uL (ref 4.22–5.81)
RDW: 15.3 % (ref 11.5–15.5)
WBC: 4.2 10*3/uL (ref 4.0–10.5)
nRBC: 0.5 % — ABNORMAL HIGH (ref 0.0–0.2)

## 2019-10-17 LAB — TROPONIN I (HIGH SENSITIVITY): Troponin I (High Sensitivity): 295 ng/L (ref <18)

## 2019-10-17 LAB — TSH: TSH: 1.033 u[IU]/mL (ref 0.350–4.500)

## 2019-10-17 LAB — SARS CORONAVIRUS 2 (TAT 6-24 HRS): SARS Coronavirus 2: POSITIVE — AB

## 2019-10-17 LAB — HIV ANTIBODY (ROUTINE TESTING W REFLEX): HIV Screen 4th Generation wRfx: NONREACTIVE

## 2019-10-17 LAB — BRAIN NATRIURETIC PEPTIDE: B Natriuretic Peptide: 2601.7 pg/mL — ABNORMAL HIGH (ref 0.0–100.0)

## 2019-10-17 LAB — POC SARS CORONAVIRUS 2 AG -  ED: SARS Coronavirus 2 Ag: NEGATIVE

## 2019-10-17 MED ORDER — DEXAMETHASONE 6 MG PO TABS
6.0000 mg | ORAL_TABLET | Freq: Every day | ORAL | Status: DC
  Administered 2019-10-17: 6 mg via ORAL
  Filled 2019-10-17 (×3): qty 1

## 2019-10-17 MED ORDER — FUROSEMIDE 10 MG/ML IJ SOLN
80.0000 mg | Freq: Once | INTRAMUSCULAR | Status: AC
  Administered 2019-10-18: 80 mg via INTRAVENOUS

## 2019-10-17 MED ORDER — LORAZEPAM 2 MG/ML IJ SOLN
1.0000 mg | Freq: Once | INTRAMUSCULAR | Status: AC
  Administered 2019-10-18: 1 mg via INTRAVENOUS
  Filled 2019-10-17: qty 1

## 2019-10-17 MED ORDER — FUROSEMIDE 10 MG/ML IJ SOLN: 40.0000 mg | Freq: Two times a day (BID) | INTRAMUSCULAR | Status: DC

## 2019-10-17 MED ORDER — AMIODARONE HCL IN DEXTROSE 360-4.14 MG/200ML-% IV SOLN
60.0000 mg/h | INTRAVENOUS | Status: AC
  Administered 2019-10-17: 60 mg/h via INTRAVENOUS
  Filled 2019-10-17 (×2): qty 200

## 2019-10-17 MED ORDER — SODIUM CHLORIDE 0.9 % IV SOLN
100.0000 mg | Freq: Every day | INTRAVENOUS | Status: DC
  Administered 2019-10-18: 100 mg via INTRAVENOUS
  Filled 2019-10-17 (×3): qty 20

## 2019-10-17 MED ORDER — FUROSEMIDE 10 MG/ML IJ SOLN
80.0000 mg | Freq: Two times a day (BID) | INTRAMUSCULAR | Status: DC
  Administered 2019-10-17: 80 mg via INTRAVENOUS
  Filled 2019-10-17 (×2): qty 8

## 2019-10-17 MED ORDER — METOPROLOL TARTRATE 25 MG/10 ML ORAL SUSPENSION
6.2500 mg | Freq: Four times a day (QID) | ORAL | Status: DC
  Administered 2019-10-17: 6.25 mg via ORAL
  Filled 2019-10-17 (×3): qty 5

## 2019-10-17 MED ORDER — RAMELTEON 8 MG PO TABS
8.0000 mg | ORAL_TABLET | Freq: Every day | ORAL | Status: DC
  Administered 2019-10-17: 8 mg via ORAL
  Filled 2019-10-17 (×7): qty 1

## 2019-10-17 MED ORDER — FLUTICASONE PROPIONATE 50 MCG/ACT NA SUSP
2.0000 | Freq: Every day | NASAL | Status: DC
  Administered 2019-10-17: 2 via NASAL
  Filled 2019-10-17: qty 16

## 2019-10-17 MED ORDER — METOPROLOL TARTRATE 25 MG/10 ML ORAL SUSPENSION
12.5000 mg | Freq: Four times a day (QID) | ORAL | Status: DC
  Administered 2019-10-17: 12.5 mg via ORAL
  Filled 2019-10-17 (×2): qty 5

## 2019-10-17 MED ORDER — HYDROCORTISONE ACE-PRAMOXINE 1-1 % EX CREA
TOPICAL_CREAM | Freq: Four times a day (QID) | CUTANEOUS | Status: DC
  Filled 2019-10-17: qty 30

## 2019-10-17 MED ORDER — AMIODARONE HCL IN DEXTROSE 360-4.14 MG/200ML-% IV SOLN
30.0000 mg/h | INTRAVENOUS | Status: DC
  Administered 2019-10-17 – 2019-10-19 (×4): 30 mg/h via INTRAVENOUS
  Filled 2019-10-17 (×4): qty 200

## 2019-10-17 MED ORDER — ONDANSETRON HCL 4 MG/2ML IJ SOLN: 4.0000 mg | Freq: Four times a day (QID) | INTRAMUSCULAR | Status: DC | PRN

## 2019-10-17 MED ORDER — SODIUM CHLORIDE 0.9 % IV SOLN
200.0000 mg | Freq: Once | INTRAVENOUS | Status: AC
  Administered 2019-10-17: 200 mg via INTRAVENOUS
  Filled 2019-10-17: qty 40

## 2019-10-17 MED ORDER — HYDROCORT-PRAMOXINE (PERIANAL) 1-1 % EX FOAM
1.0000 | Freq: Four times a day (QID) | CUTANEOUS | Status: DC
  Filled 2019-10-17 (×2): qty 10

## 2019-10-17 MED ORDER — MAGNESIUM SULFATE 2 GM/50ML IV SOLN
2.0000 g | Freq: Once | INTRAVENOUS | Status: AC
  Administered 2019-10-17: 2 g via INTRAVENOUS
  Filled 2019-10-17: qty 50

## 2019-10-17 MED ORDER — ACETAMINOPHEN 325 MG PO TABS
650.0000 mg | ORAL_TABLET | ORAL | Status: DC | PRN
  Administered 2019-10-17 – 2019-10-20 (×4): 650 mg via ORAL
  Filled 2019-10-17 (×4): qty 2

## 2019-10-17 MED ORDER — LORAZEPAM 1 MG PO TABS
1.0000 mg | ORAL_TABLET | Freq: Once | ORAL | Status: AC
  Administered 2019-10-17: 1 mg via ORAL
  Filled 2019-10-17: qty 1

## 2019-10-17 MED ORDER — LORATADINE 10 MG PO TABS
10.0000 mg | ORAL_TABLET | Freq: Every day | ORAL | Status: DC
  Administered 2019-10-17: 10 mg via ORAL
  Filled 2019-10-17 (×2): qty 1

## 2019-10-17 NOTE — Progress Notes (Signed)
Was informed that patient is Covid positive.  Would like to ensure that patient has airborne and contact precautions in place.  He will also need to be transferred to a Covid floor.

## 2019-10-17 NOTE — Progress Notes (Signed)
Dr. Caron Presume here to see patient, no new orders at this time.  Rapid response came to see patient.  Patient notified of transfer to 5W. All belongings packed. To be transferred in bed.

## 2019-10-17 NOTE — Evaluation (Signed)
Physical Therapy Evaluation Patient Details Name: Paul [name] MRN: [id number] DOB: [birth year] Today's Date: 10/17/2019   History of Present Illness  58 y.o. male presenting with shortness of breath and edema. PMH is significant for the CHF, CAD s/p LAD DES in 5/ 2016, hypertension, PAF, hyperlipidemia, CKD 4, tobacco use, arthritis, and s/p multiple bilat hip sxs.    Clinical Impression  Pt admitted with above diagnosis. PTA pt lived alone, independent. On eval, he required min guard assist bed mobility, min guard assist transfers and min guard assist ambulation 40' with RW.  Pt currently with functional limitations due to the deficits listed below (see PT Problem List). Pt will benefit from skilled PT to increase their independence and safety with mobility to allow discharge to the venue listed below.       Follow Up Recommendations Home health PT    Equipment Recommendations  None recommended by PT    Recommendations for Other Services       Precautions / Restrictions Precautions Precautions: Fall      Mobility  Bed Mobility Overal bed mobility: Needs Assistance Bed Mobility: Supine to Sit;Sit to Supine     Supine to sit: Min guard;HOB elevated Sit to supine: HOB elevated;Min guard   General bed mobility comments: min guard for safety, +rail  Transfers Overall transfer level: Needs assistance Equipment used: Rolling walker (2 wheeled) Transfers: Sit to/from Stand Sit to Stand: Min guard;From elevated surface         General transfer comment: min guard for safety from elevated surface (required elevated bed due to limited hip flexion bilat)  Ambulation/Gait Ambulation/Gait assistance: Min guard Gait Distance (Feet): 40 Feet Assistive device: Rolling walker (2 wheeled) Gait Pattern/deviations: Step-through pattern;Decreased stride length Gait velocity: decreased Gait velocity interpretation: <1.31 ft/sec, indicative of household ambulator General Gait  Details: max HR 130. 2/4 DOE.  Stairs            Wheelchair Mobility    Modified Rankin (Stroke Patients Only)       Balance Overall balance assessment: Needs assistance Sitting-balance support: No upper extremity supported;Feet supported Sitting balance-Leahy Scale: Good     Standing balance support: Bilateral upper extremity supported;During functional activity Standing balance-Leahy Scale: Poor Standing balance comment: reliant on RW                             Pertinent Vitals/Pain Pain Assessment: No/denies pain    Home Living Family/patient expects to be discharged to:: Private residence Living Arrangements: Alone Available Help at Discharge: Family;Friend(s);Available PRN/intermittently Type of Home: House Home Access: Stairs to enter Entrance Stairs-Rails: Psychiatric nurse of Steps: 2 Home Layout: One level Home Equipment: Cane - single point;Crutches;Walker - 2 wheels;Shower seat;Grab bars - tub/shower;Grab bars - toilet      Prior Function Level of Independence: Independent         Comments: occassional use of cane or RW     Hand Dominance        Extremity/Trunk Assessment   Upper Extremity Assessment Upper Extremity Assessment: Defer to OT evaluation    Lower Extremity Assessment Lower Extremity Assessment: RLE deficits/detail;LLE deficits/detail RLE Deficits / Details: limited hip flexion due to OA/previous surgeries LLE Deficits / Details: limited hip flexion due to OA/previous surgeries       Communication   Communication: No difficulties  Cognition Arousal/Alertness: Awake/alert Behavior During Therapy: WFL for tasks assessed/performed Overall Cognitive Status: Within Functional Limits for tasks assessed  General Comments General comments (skin integrity, edema, etc.): tachy during session. Resting HR 112. Max HR 130 during mobility.     Exercises     Assessment/Plan    PT Assessment Patient needs continued PT services  PT Problem List Decreased mobility;Decreased balance;Decreased activity tolerance;Cardiopulmonary status limiting activity       PT Treatment Interventions Gait training;Balance training;Stair training;Functional mobility training;DME instruction;Therapeutic activities;Therapeutic exercise;Patient/family education    PT Goals (Current goals can be found in the Care Plan section)  Acute Rehab PT Goals Patient Stated Goal: get stronger PT Goal Formulation: With patient Time For Goal Achievement: 10/31/19 Potential to Achieve Goals: Good    Frequency Min 3X/week   Barriers to discharge        Co-evaluation               AM-PAC PT "6 Clicks" Mobility  Outcome Measure Help needed turning from your back to your side while in a flat bed without using bedrails?: None Help needed moving from lying on your back to sitting on the side of a flat bed without using bedrails?: A Little Help needed moving to and from a bed to a chair (including a wheelchair)?: A Little Help needed standing up from a chair using your arms (e.g., wheelchair or bedside chair)?: A Little Help needed to walk in hospital room?: A Little Help needed climbing 3-5 steps with a railing? : A Lot 6 Click Score: 18    End of Session Equipment Utilized During Treatment: Gait belt Activity Tolerance: Treatment limited secondary to medical complications (Comment)(tachy, DOE) Patient left: in bed;with call bell/phone within reach Nurse Communication: Mobility status PT Visit Diagnosis: Other abnormalities of gait and mobility (R26.89)    Time: 6837-2902 PT Time Calculation (min) (ACUTE ONLY): 21 min   Charges:   PT Evaluation $PT Eval Moderate Complexity: 1 Mod          Lorrin Goodell, PT  Office # [phone] Pager [phone]   Lorriane Shire 10/17/2019, 2:16 PM

## 2019-10-17 NOTE — Progress Notes (Addendum)
Advanced Heart Failure Rounding Note  PCP-Cardiologist: Larae Grooms, MD   Subjective:   Admitted earlier this morning with A fib RVR and A/C systolic heart failure.   SOB with exertion and at rest.    Objective:   Weight Range: 82.9 kg Body mass index is 25.48 kg/m.   Vital Signs:   Temp:  [97.6 F (36.4 C)] 97.6 F (36.4 C) (12/09 1112) Pulse Rate:  [32-133] 112 (12/09 1313) Resp:  [18-33] 18 (12/09 1112) BP: (90-123)/(72-98) 110/81 (12/09 1313) SpO2:  [94 %-100 %] 95 % (12/09 1112) Weight:  [82.9 kg-87.5 kg] 82.9 kg (12/09 1112) Last BM Date: 10/10/2019  Weight change: Filed Weights   10/28/2019 2200 10/17/19 1112  Weight: 87.5 kg 82.9 kg    Intake/Output:   Intake/Output Summary (Last 24 hours) at 10/17/2019 1400 Last data filed at 10/17/2019 1300 Gross per 24 hour  Intake 379.4 ml  Output 880 ml  Net -500.6 ml      Physical Exam    General: Dyspnea at rest HEENT: Normal Neck: Supple. JVP to jaw  . Carotids 2+ bilat; no bruits. No lymphadenopathy or thyromegaly appreciated. Cor: PMI nondisplaced. Irregular rate & rhythm. No rubs, gallops or murmurs. Lungs: Crackles in the base Abdomen: Soft, nontender, distended. No hepatosplenomegaly. No bruits or masses. Good bowel sounds. Extremities: No cyanosis, clubbing, rash, edema Neuro: Alert & orientedx3, cranial nerves grossly intact. moves all 4 extremities w/o difficulty. Affect pleasant   Telemetry   A fib RVR 120s   EKG    N/a   Labs    CBC Recent Labs    10/11/2019 1749 10/17/19 0528  WBC 4.5 4.2  HGB 12.7* 10.9*  HCT 41.4 35.5*  MCV 80.7 80.1  PLT 189 962*   Basic Metabolic Panel Recent Labs    11/04/2019 1749 10/17/19 0528  NA 142 143  K 4.7 3.9  CL 111 111  CO2 22 19*  GLUCOSE 156* 137*  BUN 46* 45*  CREATININE 2.84* 2.65*  CALCIUM 8.5* 8.1*  MG  --  1.6*   Liver Function Tests Recent Labs    10/17/19 0528  AST 32  ALT 28  ALKPHOS 65  BILITOT 2.3*  PROT 5.5*   ALBUMIN 2.7*   No results for input(s): LIPASE, AMYLASE in the last 72 hours. Cardiac Enzymes No results for input(s): CKTOTAL, CKMB, CKMBINDEX, TROPONINI in the last 72 hours.  BNP: BNP (last 3 results) Recent Labs    10/17/19 0528  BNP 2,601.7*    ProBNP (last 3 results) Recent Labs    07/30/19 1620  PROBNP 9,273*     D-Dimer No results for input(s): DDIMER in the last 72 hours. Hemoglobin A1C No results for input(s): HGBA1C in the last 72 hours. Fasting Lipid Panel No results for input(s): CHOL, HDL, LDLCALC, TRIG, CHOLHDL, LDLDIRECT in the last 72 hours. Thyroid Function Tests Recent Labs    10/17/19 0528  TSH 1.033    Other results:   Imaging    Dg Chest 2 View  Result Date: 10/24/2019 CLINICAL DATA:  Acute chest pain and shortness of breath. EXAM: CHEST - 2 VIEW COMPARISON:  02/13/2018 and prior studies FINDINGS: Cardiomegaly, pulmonary vascular congestion and mild interstitial opacities noted. There is no evidence of focal airspace disease, suspicious pulmonary nodule/mass, pleural effusion, or pneumothorax. No acute bony abnormalities are identified. IMPRESSION: 1. Cardiomegaly with pulmonary vascular congestion and probable mild interstitial edema. 2. No evidence of focal pneumonia. Electronically Signed   By: Dellis Filbert  Hu M.D.   On: 11/08/2019 18:23      Medications:     Scheduled Medications: . aspirin EC  81 mg Oral Daily  . fluticasone  2 spray Each Nare Daily  . hydrocortisone-pramoxine  1 applicator Rectal QID  . loratadine  10 mg Oral Daily  . metoprolol tartrate  6.25 mg Oral Q6H  . sodium chloride flush  3 mL Intravenous Once     Infusions: . heparin 1,200 Units/hr (10/17/19 0808)     PRN Medications:  acetaminophen, ondansetron (ZOFRAN) IV     Assessment/Plan   1. COVID + Primary team notified. Will need to transfer to negative pressure room.  2. A/C Systolic HF , most recent ECHO 08/2019 EF 30-35% with possible  infiltrative cardimyopathy. 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 BNP  Start 80 mg IV lasix twice daily.  BP soft. Stop lopressor with addition of amiodarone.  No spiro or arb with elevated creatinine.  Hold off on bidil with soft BP.   3. A fib RVR  Had A flutter ablation 03/2018 so anticoagulants were stopped.  Initially diltiazem but EF is < 35% so stopped lopressor was started.  Stop lopressor and start amio drip.  On heparin   4. CKD Stage IV Creatinine baseline ~2.5 - 2.8  Creatinine on admit 2.8.  Follow BMET daily.   5. CAD  - s/p post MI treated with staged DES to the LAD and circumflex 04/2015:  - Last cath 2016    Prox RCA lesion, 20% stenosed.  RPDA lesion, 60% stenosed.  1st Diag lesion, 60% stenosed.  Mid Cx lesion, 90% stenosed. There is a 0% residual stenosis post intervention. A drug-eluting stent was placed.   Length of Stay: 1  Amy Clegg, NP  10/17/2019, 2:00 PM  Advanced Heart Failure Team Pager [phone] (M-F; 7a - 4p)  Please contact [facility] for night-coverage after hours (4p -7a ) and weekends on [email or web address]  Patient seen and examined with Darrick Grinder, NP. We discussed all aspects of the encounter. I agree with the assessment and plan as stated above.   58 y/o male with h/o CAD, CKD IV, PAF/AFL and systolic HF felt secondary to tachy-mediated CM.   TEE in 5/19 with EF 25-30%. Underwent AF ablation 5/19. Echo 08/14/19 EF 30-35%.   Now presents with worsening HF symptoms. Found to be back in AF with RVR. CXR with HF.Marland Kitchen BNP 2601 Creatinine near baseline at 2.65. COVID test +  One exam He is volume overloaded and tachycardic. + crackles  Agree with IV lasix and IV amiodarone..Heparin for AC. If doesn't convert chemically will need TEE/DC-CVV (COVID permitting) . Repeat echo.   Glori Bickers, MD  7:14 PM

## 2019-10-17 NOTE — Progress Notes (Signed)
[place] for Heparin  Indication: atrial fibrillation  Allergies  Allergen Reactions  . 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 Ibuprofen Other (See Comments)    Kidney damage    Patient Measurements: Height: 5\' 11"  (180.3 cm) Weight: 182 lb 11.2 oz (82.9 kg) IBW/kg (Calculated) : 75.3 Heparin Dosing Weight: 87.5 kg  Vital Signs: Temp: 97.6 F (36.4 C) (12/09 1112) Temp Source: Oral (12/09 1112) BP: 104/81 (12/09 1743) Pulse Rate: 64 (12/09 1712)  Labs: Recent Labs    10/26/2019 1749 10/17/19 0528 10/17/19 1722  HGB 12.7* 10.9*  --   HCT 41.4 35.5*  --   PLT 189 146*  --   HEPARINUNFRC  --  0.87* 0.26*  CREATININE 2.84* 2.65*  --   TROPONINIHS 433* 295*  --     Estimated Creatinine Clearance: 31.6 mL/min (A) (by C-G formula based on SCr of 2.65 mg/dL (H)).   Assessment: 44 yom that presented to the ED in A-fib w/ RVR. Patient's CHA2DS2-VAsc score is > 2. With this pharmacy has been asked to dose heparin at this time.   Heparin level is now low at 0.26, no bleeding noted, Hgb down to 10.9, platelets are normal. No problems with infusion or bleeding noted.   Goal of Therapy:  Heparin level 0.3-0.7 units/ml Monitor platelets by anticoagulation protocol: Yes   Plan:  Increase heparin drip to 1300 units/hr Heparin level in am Monitor for s/sx of bleeding F/u with long term anticoagulation plans as closer to discharge  Thank you for involving pharmacy in this patient's care.  Erin Hearing PharmD., BCPS Clinical Pharmacist 10/17/2019 6:08 PM

## 2019-10-17 NOTE — Progress Notes (Signed)
Paul [name] [id number] Admission Data: 10/17/2019 7:03 PM Attending Provider: McDiarmid, Blane Ohara, MD  UEK:CMKLKJZ, [name], DO Consults/ Treatment Team: Treatment Team:  Lbcardiology, Michae Kava, MD  Paul [name] is a 58 y.o. male patient admitted from ED awake, alert  & orientated  X 3,  Full Code, VSS - Blood pressure 101/74, pulse (!) 110, temperature (!) 97.5 F (36.4 C), temperature source Oral, resp. rate (!) 28, height $RemoveBe'5\' 11"'[id number]$  (1.803 m), weight 82.9 kg, SpO2 97 %., O2   6 L nasal cannular,. Tele 5W room 1 placed and pt is currently running Afib 110. .   IV site WDL:  forearm right, condition patent and no redness with a transparent dsg that's clean dry and intact.  Allergies:   Allergies  Allergen Reactions  . 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 Ibuprofen Other (See Comments)    Kidney damage     Past Medical History:  Diagnosis Date  . Acute diastolic CHF (congestive heart failure), NYHA class 4 ([place]) 03/18/2015  . Arthritis   . Atrial flutter with rapid ventricular response ([place]) 03/13/2018   S/p ablation  . BACK PAIN 10/11/2007   Qualifier: Diagnosis of  By: Darylene Price MD, Elta [place]    . 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 Biventricular heart failure ([place]) 10/17/2019   TTE 05/2019: EF 30 to 35%. Severe LVH. Asymmetric left ventricular hypertrophy.Left ventricular diastolic restrictive filling pattern.  Right Ventricle: The right ventricular size is mildly enlarged. estimated right ventricular systolic pressure is moderately elevated at 44.0 mmHg. Left atrial size was severely dilated. Right atrial size was moderately dilated  . CAD (coronary artery disease)    a. 03/2015 NSTEMI/PCI: LM nl, LAD 95p (3.5x20 Promus DES), D1 60, D2/3 mild dzs, LCX 67m (plan for staged pci), OM1/2 min irregs, RCA 20p, RPDA 60.  Marland Kitchen CHF exacerbation ([place]) 02/09/2018  .  Chronic combined systolic and diastolic CHF (congestive heart failure) ([place]) 03/27/2015  . Chronic pain syndrome 05/31/2016   Pain contract signed 05/31/16 Patient gets Percocet 7.5-325 mg #100 q3 months  . Chronic renal insufficiency, stage III (moderate) 04/10/2015  . CKD (chronic kidney disease), stage III   . GERD (gastroesophageal reflux disease)    occ  . Hemorrhoids 10/10/2019  . History of cocaine use 02/18/2018  . HOCM (hypertrophic obstructive cardiomyopathy) ([place])    a. 03/2015 Echo: EF 65-60%, sev LV thickening w/ bright, speckled appearance of IV septum, Gr 2 DD, AoV sclerosis, triv AI, mild MR, LA 63ml/m^2, RA 36ml/m^2, aneurysmal IAS, mod TR, PASP 27mmHg.  Marland Kitchen Hyperbilirubinemia 10/17/2019  . Hypertension    denies  . Hypertensive cardiomyopathy ([place]) 09/24/2012  . Hypertrophic obstructive cardiomyopathy ([place]) 08/07/2008   ECHO 09/2012 79-15%, grade 2 diastolic dysfunction    . Hypoproteinemia ([place]) 10/17/2019  . Microcytic anemia 03/19/2015  . NSTEMI (non-ST elevated myocardial infarction) ([place]) 03/18/2015  . 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 PAF (paroxysmal atrial fibrillation) ([place])    remote isolated episode - 09/2012.  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 Renal failure 01/05/2007   Qualifier: Diagnosis of  By: Darylene Price MD, Elta [place]    . S/P hip replacement 12/25/2012  . Shortness of breath 02/09/2018  . 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 Thrombosed external hemorrhoid 10/17/2019  . TMJ arthralgia   . 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  Typical atrial flutter ([place]) 02/09/2018  . 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 Information packet given to patient/family and safety video watched.  Admission INP armband ID verified with patient/family, and in place. SR up x 2, fall risk assessment complete with Patient and family verbalizing understanding of risks associated with falls. Pt verbalizes an understanding of how to use the call bell and  to call for help before getting out of bed.  Skin, clean-dry- intact without evidence of bruising, or skin tears.   No evidence of skin break down noted on exam.     Will cont to monitor and assist as needed.  Paul Burby Shelda Pal, RN 10/17/2019 7:03 PM

## 2019-10-17 NOTE — Consult Note (Signed)
Cardiology Consultation:   Patient ID: Paul [name] MRN: [id number]; DOB: September 08, 1961  Admit date: 10/27/2019 Date of Consult: 10/17/2019  Primary Care Provider: [name], [name], DO Primary Cardiologist: Larae Grooms, MD  Primary Electrophysiologist:  None    Patient Profile:   Paul [name] is a 58 y.o. male with NYHA III/ ACC C mixed ICM/NICM (LVEF 30%, severe RV dysfunction, LVEDD 6.5 cm, no ICD) with echo features suggestive of infiltrative disease and PYP scan equivocal for TTR amyloid, CKD with baseline Cr ~ 2, pAF not on [facility] s/p ablation (2019), and CAD s/p PCI of LAD and LCx 2016 who presents with progressive shortness of breath and volume retention found to be in AF w/ RVR.   History of Present Illness:   [name] presented initially in 2019 in rapid aflutter found to have new biventricular HF with LVEF 30-35% and severe RV dysfunction. Notably, echo features suggestive of amyloidosis and PYP equivocal. SPEP/UPEP without suggestive of multiple myeloma and fat pad biopsy deferred due to need for anticoagulation x 30 days after inpatient cardioversion.   He did not begin to develop recurrent HF symptoms until 06/2019 when he noted increased dyspnea on exertion and was placed on PRN lasix for weight gain and dyspnea. Over the last 3-4 weeks, he has noted that despite improvement in LE edema with lasix, he continues to have marked dyspnea on exertion - last night breathless only walking a few steps around the house. He has to sleep many nights in a recliner due to increased SOB with recumbency. He has not noticed and increase in his HR or palpitations. His abdomen has been more bloated. His dry weight is unclear.   Upon arrival to the [facility], patient in AF with RVR in the 130s. SBP 100s -120s. He was noted to be in mild respiratory distress with RR high 20s. CXR with some increased pulmonary vascular congestion. Labs notable for hsTn 433 and pro-BNP pending. Cr up to 2.84 from ~  2. He was given 18m IV lasix with little urine output and admitted to family medicine. Cardiology consulted for additional recommendations.   Past Medical History:  Diagnosis Date  . Arthritis   . CAD (coronary artery disease)    a. 03/2015 NSTEMI/PCI: LM nl, LAD 95p (3.5x20 Promus DES), D1 60, D2/3 mild dzs, LCX 957mplan for staged pci), OM1/2 min irregs, RCA 20p, RPDA 60.  . Marland Kitchenhronic renal insufficiency, stage III (moderate) 04/10/2015  . CKD (chronic kidney disease), stage III   . GERD (gastroesophageal reflux disease)    occ  . [facility]   a. 03/2015 Echo: EF 65-60%, sev LV thickening w/ bright, speckled appearance of IV septum, Gr 2 DD, AoV sclerosis, triv AI, mild MR, LA 7732m^2, RA 51m63m2, aneurysmal IAS, mod TR, PASP 56mm43m . HypMarland Kitchenrtension    denies  . PAF (paroxysmal atrial fibrillation) (HCC) [place] isolated episode - 09/2012.  . TMJ arthralgia     Past Surgical History:  Procedure Laterality Date  . A-FLUTTER ABLATION N/A 03/13/2018   Procedure: A-FLUTTER ABLATION;  Surgeon: TayloEvans Lance  [facility];  Service: Cardiovascular;  Laterality: N/A;  . APPENDECTOMY    . CARDIAC CATHETERIZATION N/A 03/19/2015   Procedure: Left Heart Cath and Coronary Angiography;  Surgeon: MuhamWellington Hampshire  [facility];  Service: Cardiovascular;  Laterality: N/A;  . CARDIAC CATHETERIZATION  03/19/2015   Procedure: Coronary Stent Intervention;  Surgeon: [name], MD;  Location: [place] CV LAB;  Service: Cardiovascular;;  . CARDIAC CATHETERIZATION N/A 04/09/2015   Procedure: Coronary Stent Intervention;  Surgeon: [name], MD;  Location: [place] CV LAB;  Service: Cardiovascular;  Laterality: N/A;  . CARDIOVERSION N/A 02/15/2018   Procedure: CARDIOVERSION;  Surgeon: [name], MD;  Location: [place];  Service: Cardiovascular;  Laterality: N/A;  . HIP SURGERY Left 74   lft    . LAPAROSCOPIC APPENDECTOMY  09/22/2012   Procedure: APPENDECTOMY LAPAROSCOPIC;  Surgeon: Harl Bowie, MD;  Location: [facility];  Service: General;;  . LAPAROSCOPY  09/22/2012   Procedure: LAPAROSCOPY DIAGNOSTIC;  Surgeon: Harl Bowie, MD;  Location: [facility];  Service: General;  Laterality: N/A;  . TEE WITHOUT CARDIOVERSION N/A 02/15/2018   Procedure: TRANSESOPHAGEAL ECHOCARDIOGRAM (TEE);  Surgeon: [name], MD;  Location: [place];  Service: Cardiovascular;  Laterality: N/A;  . TEE WITHOUT CARDIOVERSION N/A 03/13/2018   Procedure: TRANSESOPHAGEAL ECHOCARDIOGRAM (TEE);  Surgeon: Lelon Perla, MD;  Location: [facility] ENDOSCOPY;  Service: Cardiovascular;  Laterality: N/A;  . TOTAL HIP ARTHROPLASTY Right 03,01   x2  . TOTAL HIP ARTHROPLASTY WITH HARDWARE REMOVAL Left 08/05/2014   Procedure: TOTAL HIP ARTHROPLASTY WITH HARDWARE REMOVAL;  Surgeon: Kerin Salen, MD;  Location: [place];  Service: Orthopedics;  Laterality: Left;     Home Medications:  Prior to Admission medications   Medication Sig Start Date End Date Taking? Authorizing Provider  aspirin 81 MG tablet Take 1 tablet (81 mg total) by mouth daily. 09/12/18   [name], [name], DO  carvedilol (COREG) 3.125 MG tablet Take 1 tablet (3.125 mg total) by mouth 2 (two) times daily. 02/08/19 07/30/19  Evans Lance, MD  fluticasone (FLONASE) 50 MCG/ACT nasal spray Place 2 sprays into both nostrils daily. 10/24/18   [name], [name], DO  furosemide (LASIX) 40 MG tablet Take one tablet daily for 3 days.  Then take one tablet by mouth as needed for swelling or shortness of breath at night. 02/08/19   Evans Lance, MD  loratadine (CLARITIN) 10 MG tablet Take 1 tablet (10 mg total) by mouth daily. 10/24/18   [name], [name], DO  sildenafil (VIAGRA) 100 MG tablet TAKE 1 TABLET BY MOUTH DAILY FOR ERECTILE DYSFUNCTION (30 MINUTES TO 4 HOURS BEFORE SEX). 07/26/19   [name], [name], DO  traMADol (ULTRAM) 50 MG tablet TAKE 1 TABLET BY MOUTH  EVERY 12 HOURS AS NEEDED FOR MODERATE PAIN 11/16/18   [name], [name], DO    Inpatient Medications: Scheduled Meds: . aspirin EC  81 mg Oral Daily  . fluticasone  2 spray Each Nare Daily  . furosemide  40 mg Intravenous Once  . heparin  5,500 Units Intravenous Once  . loratadine  10 mg Oral Daily  . metoprolol tartrate  12.5 mg Oral Q6H  . sodium chloride flush  3 mL Intravenous Once   Continuous Infusions: . heparin     PRN Meds: acetaminophen, ondansetron (ZOFRAN) IV  Allergies:    Allergies  Allergen Reactions  . 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 Ibuprofen Other (See Comments)    Kidney damage    Social History:   Social History   Socioeconomic History  . Marital status: Divorced    Spouse name: Not on file  . Number of children: Not on file  . Years of education: Not on file  . Highest education level: Not on file  Occupational History  .  Not on file  Social Needs  . Financial resource strain: Not on file  . Food insecurity    Worry: Patient refused    Inability: Patient refused  . Transportation needs    Medical: Patient refused    Non-medical: Patient refused  Tobacco Use  . Smoking status: Light Tobacco Smoker    Packs/day: 0.25    Years: 10.00    Pack years: 2.50    Types: Cigarettes  . Smokeless tobacco: Never Used  Substance and Sexual Activity  . Alcohol use: Yes    Alcohol/week: 2.0 standard drinks    Types: 2 Cans of beer per week    Comment: occasional  . Drug use: No  . Sexual activity: Not Currently  Lifestyle  . Physical activity    Days per week: Patient refused    Minutes per session: Patient refused  . Stress: Not on file  Relationships  . Social Herbalist on phone: Patient refused    Gets together: Patient refused    Attends religious service: Patient refused    Active member of club or organization: Patient refused    Attends meetings of clubs or organizations: Patient refused     Relationship status: Patient refused  . Intimate partner violence    Fear of current or ex partner: Patient refused    Emotionally abused: Patient refused    Physically abused: Patient refused    Forced sexual activity: Patient refused  Other Topics Concern  . Not on file  Social History Narrative  . Not on file    Family History:   Family History  Problem Relation Age of Onset  . Heart disease Mother   . Heart disease Father      Review of Systems: [y] = yes, '[ ]'$  = no     General: Weight gain '[ ]'$ ; Weight loss '[ ]'$ ; Anorexia '[ ]'$ ; Fatigue '[ ]'$ ; Fever '[ ]'$ ; Chills '[ ]'$ ; Weakness '[ ]'$    Cardiac: Chest pain/pressure '[ ]'$ ; Resting SOB Blue.Reese ]; Exertional SOB '[ ]'$ ; Orthopnea Blue.Reese ]; Pedal Edema Blue.Reese ]; Palpitations '[ ]'$ ; Syncope '[ ]'$ ; Presyncope '[ ]'$ ; Paroxysmal nocturnal dyspnea'[ ]'$    Pulmonary: Cough '[ ]'$ ; Wheezing'[ ]'$ ; Hemoptysis'[ ]'$ ; Sputum '[ ]'$ ; Snoring '[ ]'$    GI: Vomiting'[ ]'$ ; Dysphagia'[ ]'$ ; Melena'[ ]'$ ; Hematochezia '[ ]'$ ; Heartburn'[ ]'$ ; Abdominal pain '[ ]'$ ; Constipation '[ ]'$ ; Diarrhea '[ ]'$ ; BRBPR '[ ]'$    GU: Hematuria'[ ]'$ ; Dysuria '[ ]'$ ; Nocturia'[ ]'$    Vascular: Pain in legs with walking '[ ]'$ ; Pain in feet with lying flat '[ ]'$ ; Non-healing sores '[ ]'$ ; Stroke '[ ]'$ ; TIA '[ ]'$ ; Slurred speech '[ ]'$ ;   Neuro: Headaches'[ ]'$ ; Vertigo'[ ]'$ ; Seizures'[ ]'$ ; Paresthesias'[ ]'$ ;Blurred vision '[ ]'$ ; Diplopia '[ ]'$ ; Vision changes '[ ]'$    Ortho/Skin: Arthritis '[ ]'$ ; Joint pain '[ ]'$ ; Muscle pain '[ ]'$ ; Joint swelling '[ ]'$ ; Back Pain '[ ]'$ ; Rash '[ ]'$    Psych: Depression'[ ]'$ ; Anxiety'[ ]'$    Heme: Bleeding problems '[ ]'$ ; Clotting disorders '[ ]'$ ; Anemia '[ ]'$    Endocrine: Diabetes '[ ]'$ ; Thyroid dysfunction'[ ]'$   Physical Exam/Data:   Vitals:   11/06/2019 2130 10/17/2019 2200 10/24/2019 2230 11/04/2019 2300  BP: 104/87 102/77 106/81 123/84  Pulse: 61 (!) 105    Resp: (!) 29 (!) 27 (!) 26 (!) 22  Temp:      TempSrc:      SpO2: 99% 95%    Weight:  87.5 kg    Height:  5' 11"  (1.803 m)     No intake or output data in the 24 hours ending 10/17/19 0029 Filed Weights    10/13/2019 2200  Weight: 87.5 kg   Body mass index is 26.92 kg/m.  General:  Well nourished, well developed, in mild respiratory distress.  HEENT: normal Lymph: no adenopathy Neck: no JVD Endocrine:  No thryomegaly Vascular: No carotid bruits; FA pulses 2+ bilaterally without bruits  Cardiac: irregularly irregular, tachycardic.  Lungs:  clear to auscultation bilaterally, no wheezing, rhonchi or rales  Abd: soft, nontender, no hepatomegaly  Ext: 1+ edema.  Musculoskeletal:  No deformities, BUE and BLE strength normal and equal Skin: cool and dry.  Neuro:  CNs 2-12 intact, no focal abnormalities noted Psych:  Normal affect   EKG:  The EKG was personally reviewed and demonstrates:  AF w/ RVR  Relevant CV Studies: TTE 08/2019: 1. Left ventricular ejection fraction, by visual estimation, is 30 to 35%. The left ventricle has moderately decreased function. Moderately increased left ventricular size. There was severe LV hypertrophy with some asymmetry noted, the septum and  anterior walls were thicker than inferolateral and inferior walls. Diffuse hypokinesis. There was no LV ouflow tract gradient. Possible infiltrative cardiomyopathy, cannot rule out hypertrophic cardiomyopathy.  2. Left ventricular diastolic Doppler parameters are consistent with restrictive filling pattern of LV diastolic filling.  3. Left atrial size was severely dilated.  4. The mitral valve is normal in structure. Mild mitral valve regurgitation. No evidence of mitral stenosis. No mitral valve systolic anterior motion.  5. There is mild dilatation of the ascending aorta measuring 37 mm.  6. The tricuspid valve is normal in structure. Tricuspid valve regurgitation is trivial.  7. The aortic valve is tricuspid Aortic valve regurgitation was not visualized by color flow Doppler. Mild aortic valve sclerosis without stenosis.  8. Right atrial size was moderately dilated.  9. Global right ventricle has mildly reduced  systolic function.The right ventricular size is mildly enlarged. No increase in right ventricular wall thickness. 10. The inferior vena cava is dilated in size with >50% respiratory variability, suggesting right atrial pressure of 8 mmHg. 11. The tricuspid regurgitant velocity is 3.00 m/s, and with an assumed right atrial pressure of 8 mmHg, the estimated right ventricular systolic pressure is moderately elevated at 44.0 mmHg. 12. The patient was in atrial fibrillation.  Laboratory Data:  Chemistry Recent Labs  Lab 11/04/2019 1749  NA 142  K 4.7  CL 111  CO2 22  GLUCOSE 156*  BUN 46*  CREATININE 2.84*  CALCIUM 8.5*  GFRNONAA 23*  GFRAA 27*  ANIONGAP 9    No results for input(s): PROT, ALBUMIN, AST, ALT, ALKPHOS, BILITOT in the last 168 hours. Hematology Recent Labs  Lab 11/03/2019 1749  WBC 4.5  RBC 5.13  HGB 12.7*  HCT 41.4  MCV 80.7  MCH 24.8*  MCHC 30.7  RDW 15.2  PLT 189   Cardiac EnzymesNo results for input(s): TROPONINI in the last 168 hours. No results for input(s): TROPIPOC in the last 168 hours.  BNPNo results for input(s): BNP, PROBNP in the last 168 hours.  DDimer No results for input(s): DDIMER in the last 168 hours.  Radiology/Studies:  Dg Chest 2 View  Result Date: 10/28/2019 CLINICAL DATA:  Acute chest pain and shortness of breath. EXAM: CHEST - 2 VIEW COMPARISON:  02/13/2018 and prior studies FINDINGS: Cardiomegaly, pulmonary vascular congestion and mild interstitial opacities noted. There is no evidence of focal airspace disease, suspicious pulmonary nodule/mass, pleural effusion,  or pneumothorax. No acute bony abnormalities are identified. IMPRESSION: 1. Cardiomegaly with pulmonary vascular congestion and probable mild interstitial edema. 2. No evidence of focal pneumonia. Electronically Signed   By: Margarette Canada M.D.   On: 11/04/2019 18:23    Assessment and Plan:   Mr. Deboy is a 58 year old gentleman with likely mixed ICM/NICM (severe RV dysfunction,  LVEF ~ 30%), pAF s/p ablation, and CKD who presents with increasing dyspnea and volume retention found to be in AF w/ RVR.   Based upon restrictive features of echo, do not suspect that patient will tolerate AF well and this is very likely the cause of his progressive volume retention and dyspnea over the last few months (TTE in 08/2019 noted AF and EKG 12/2 in AF with RVR). He will do better in sinus rhythm, but would ideally try to optimize his volume status before attempted DCCV.   In this regard, patient noted to have new AKI with increase difficulties with volume retention over the last few months. We have yet to assess his hemodynamics but in additional to congestion I query whether he has an element of low output HF that is contributing to his worsening diuretic refractoriness. Certainly suspicion of at least moderate PH on most recent TTE (eRVSP mid 80s).   Finally, the question of whether he has a yet undiagnosed infiltrative disorder may need to be addressed, particularly in light of recrudescence of atrial arrhythmias and worsening HF. Would consider revisiting fat pad biopsy prior to DCCV if question of amyloid remains   #AF w/ RVR -- Would avoid CCBs due to negative inotropic effects in setting of biventricular dysfunction -- Would consider starting metoprolol 6.25 q6 PO for rate control with goal HR ~ 110. Ensure holding parameters for hypotension.  -- Will need TEE and DCCV prior to discharge once volume optimized.  -- Agree with heparin gtt for now and can transition to NOAC prior to discharge.   #ADHF -- Check pro-BNP, TSH, and CMP.  -- Agree with diuresis. Would plan to challenge with 60-37m IV lasix given low eGFR with goal net negative 1-2 L over the next 24 hours.  -- Will plan RHC to assess output as may need inotrope assisted diuresis if in low output state.  -- Keep NPO @ [facility] for possible RHC.  -- Will need to determine trajectory of AKI to determine candidacy for other  GDMT.  -- Will need to discuss role of ICD therapy given now persistently reduced LVEF if we presumed cardiomyopathy to be other than tachycardia mediated.   #AKI -- Please send urinalysis.   For questions or updates, please contact CL[place]Please consult [email or web address] for contact info under   Signed, LMilus Banister MD  10/17/2019 12:29 AM

## 2019-10-17 NOTE — Progress Notes (Signed)
[place] for Heparin  Indication: atrial fibrillation  Allergies  Allergen Reactions  . 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 Ibuprofen Other (See Comments)    Kidney damage    Patient Measurements: Height: 5\' 11"  (180.3 cm) Weight: 193 lb (87.5 kg) IBW/kg (Calculated) : 75.3 Heparin Dosing Weight: 87.5 kg  Vital Signs: BP: 101/85 (12/09 0800) Pulse Rate: 116 (12/09 0645)  Labs: Recent Labs    10/31/2019 1749 10/17/19 0528  HGB 12.7* 10.9*  HCT 41.4 35.5*  PLT 189 146*  HEPARINUNFRC  --  0.87*  CREATININE 2.84* 2.65*  TROPONINIHS 433* 295*    Estimated Creatinine Clearance: 31.6 mL/min (A) (by C-G formula based on SCr of 2.65 mg/dL (H)).   Assessment: 50 yom that presented to the ED in A-fib w/ RVR. Patient's CHA2DS2-VAsc score is > 2. With this pharmacy has been asked to dose heparin at this time.   Heparin level is supratherapeutic at 0.87, although a 5 hr level with renal dysfunction. No bleeding noted, Hgb down to 10.9, platelets are normal. No problems with infusion or bleeding per RN.  Goal of Therapy:  Heparin level 0.3-0.7 units/ml Monitor platelets by anticoagulation protocol: Yes   Plan:  Decrease heparin drip to 1200 units/hr 8 hr heparin level Daily heparin level, CBC Monitor for s/sx of bleeding F/u with long term anticoagulation plans as closer to discharge  Thank you for involving pharmacy in this patient's care.  Renold Genta, PharmD, BCPS Clinical Pharmacist Clinical phone for 10/17/2019 until 3p is [phone] 10/17/2019 8:22 AM  **Pharmacist phone directory can be found on [email or web address] listed under [place]**

## 2019-10-17 NOTE — Progress Notes (Signed)
Family Medicine Teaching Service Daily Progress Note Intern Pager: [phone]  Patient name: Paul [name] Medical record number: [id number] Date of birth: [birth year] Age: 58 y.o. Gender: male  Primary Care Provider: [name], [name], DO Consultants: Cardiology Code Status: Full code  Pt Overview and Major Events to Date:  11/08/2019-patient admitted for CHF exacerbation  Assessment and Plan: Paul [name] is a 58 y.o. male presenting with shortness of breath and edema. PMH is significant forthe CHF, CADs/p LAD DES in 03/2015, hypertension, PAF, hyperlipidemia, CKD 4, tobacco useandarthritis.  Acute on chronic CHF, A. fib with RVR Patient presented with 5 days of shortness of breath, edema, fatigue.  Chest x-ray showed cardiomegaly with pulmonary edema.  Echo 10/6 showed EF 30-35%.  Troponin on admission elevated at 433 likely secondary to demand ischemia.  Troponin trended to 295.  Patient follows with Dr. Irish Paul [name] as primary cardiologist and has seen Dr. Lovena Paul [name].  Cardiology is following.  BNP 2601.7, TSH 1.033. -Vitals per routine -Continuous pulse ox and telemetry -Cardiology following, recommendations appreciated -Metoprolol 6.25 mg 4 times daily, titrate as necessary -Planning for TEE and DCCV prior to discharge once volume is optimized -Can transition from heparin GTT to NOAC prior to discharge -Possible right heart cath this morning 12/9 -Cardiology is considering role of ICD therapy -Consider amyloidosis work-up -Continue IV Lasix and titrate based on CMP -Heparin drip per pharmacy consult, CHA2DS2-VASc 3 -Daily weights -Strict I's and O's  CAD/ACS rule out  Denies chest pain Trop 433 today, EKG  Afib with RVR, no ischemic changes S/p stent 2016, patent LAD stent. Echo in 08/14/2019 showed EF 30-35% Home meds: Aspirin 81mg , Carvedilol 3.125mg  BID  -Continue home Aspirin -Hold home Carvediliol -Metoprolol 12.5mg  QID, titrate as needed, per Cardiology  recs -Continue ACS rule out, trend Troponins until flat   Hypertension BPs soft in ED, 280-034J systolic Home meds: Carvedilol 3.125mg  BID, Lasix 40mg  once daily -Hold Carvedilol -IV Lasix per cardiology  AKI on CKD Stage IV Cr on admission 2.84, creatinine today 2.65,Baseline Cr ~1.8 -Avoid nephrotoxic agents -AM CMP -Continue to monitor while diuresing  Scleral icterus  Obvious scleral icterus on examination, pt denies noticing this. Diffuse tenderness on abdominal exam. No guarding or rebound tenderness, no obvious organomegaly.  CMP on 12/9 showed AST/ALT 32/28   Ix for Amyloidosis Amyloidosis considered as cause for CHF exacerbation  -Ix per cardiology recs  Tobacco use disorder  -Encourage cessation -Offered nicotine patch  Seasonal allergies Loratadine 10mg  once daily,  Flonase spray  -continue home meds  Chronic pain 2/2 hip replacements  Takes Tramadol at home 50mg  BID  -Hold for now    FEN/GI: NPO, sips with meds PPx: Heparin GTT  Disposition: Pending further evaluation  Subjective:  Patient reports he is doing well this morning and that he does not notice any shortness of breath.  He is hungry and is requesting food.  Denies any chest pain.  Objective: Temp:  [97.6 F (36.4 C)] 97.6 F (36.4 C) (12/08 1927) Pulse Rate:  [32-133] 103 (12/09 0633) Resp:  [18-33] 21 (12/09 1791) BP: (90-123)/(72-97) 121/86 (12/09 0636) SpO2:  [94 %-99 %] 96 % (12/09 5056) Weight:  [87.5 kg] 87.5 kg (12/08 2200) General: NAD resting comfortably speaking to cardiology when I enter the room. HEENT: Atraumatic. Normocephalic.   Neck: No cervical lymphadenopathy.  Cardiac: Irregularly irregular rhythm, patient is tachycardic in the low 100s.  No murmurs appreciated Respiratory: CTAB, patient has mild respiratory distress especially with talking. Abdomen: soft, nontender, nondistended,  bowel sounds normal Skin: warm and dry, no rashes noted Neuro: alert and  oriented  Laboratory: Recent Labs  Lab 10/13/2019 1749 10/17/19 0528  WBC 4.5 4.2  HGB 12.7* 10.9*  HCT 41.4 35.5*  PLT 189 146*   Recent Labs  Lab 10/24/2019 1749  NA 142  K 4.7  CL 111  CO2 22  BUN 46*  CREATININE 2.84*  CALCIUM 8.5*  GLUCOSE 156*   EKG-A. fib with RVR BNP-2601.7 Troponin-433> 295 Heparin unfractionated-0.87 TSH-1.033 UA-normal  Imaging/Diagnostic Tests: Dg Chest 2 View  Result Date: 10/21/2019 CLINICAL DATA:  Acute chest pain and shortness of breath. EXAM: CHEST - 2 VIEW COMPARISON:  02/13/2018 and prior studies FINDINGS: Cardiomegaly, pulmonary vascular congestion and mild interstitial opacities noted. There is no evidence of focal airspace disease, suspicious pulmonary nodule/mass, pleural effusion, or pneumothorax. No acute bony abnormalities are identified. IMPRESSION: 1. Cardiomegaly with pulmonary vascular congestion and probable mild interstitial edema. 2. No evidence of focal pneumonia. Electronically Signed   By: Margarette Canada M.D.   On: 11/04/2019 18:23     Gifford Shave, MD 10/17/2019, 6:52 AM PGY-1, [place] Intern pager: [phone], text pages welcome

## 2019-10-17 NOTE — Progress Notes (Signed)
Lab notified RN patient Covid positive.  Charge nurse notified.  Dr. Caron Presume notified.  Contact and airborne precautions initiated.  Order to transfer patient to 5w, awaiting bed.

## 2019-10-17 NOTE — Progress Notes (Signed)
Pharmacy Antibiotic Note  Paul [name] is a 58 y.o. male admitted on 11/01/2019 with a fib with RVR, A/C systolic heart failure.  Pt also tested positive for COVID. Pharmacy has been consulted for remdesivir dosing.  Other medical problems include: hypomagnesemia, hypoproteinemia, microcytic anemia, hyperbilirubinemia, CKD stage IV, CAD  ALT 28, AST 32  Plan: Remdesivir 200 mg IV X 1, followed by remdesivir 100 mg IV X 4 days Monitor daily LFTs, clinical improvement  Height: $Remove'5\' 11"'CPeuAdl$  (180.3 cm) Weight: 182 lb 11.2 oz (82.9 kg) IBW/kg (Calculated) : 75.3  Temp (24hrs), Avg:97.6 F (36.4 C), Min:97.5 F (36.4 C), Max:97.6 F (36.4 C)  Recent Labs  Lab 10/14/2019 1749 10/17/19 0528  WBC 4.5 4.2  CREATININE 2.84* 2.65*    Estimated Creatinine Clearance: 31.6 mL/min (A) (by C-G formula based on SCr of 2.65 mg/dL (H)).    Allergies  Allergen Reactions  . 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 Ibuprofen Other (See Comments)    Kidney damage   Microbiology results: 12/9 COVID: positive  Thank you for allowing pharmacy to be a part of this patient's care.  Gillermina Hu, PharmD, BCPS, [facility] Clinical Pharmacist 10/17/2019 8:24 PM

## 2019-10-17 NOTE — Significant Event (Signed)
Rapid Response Event Note  Overview: Arrival Time: 4270    While rounding, asked by charge nurse to check on patient. Patient admitted from the ED with initial with shortness of breath, initially thought to be related to CHF and Afib with RVR. Following patient admission, patient found to be Covid positive.  Assessment: Patient lying in bed. Oxygen saturation 97% on 2LNC. HR 90s-110s on Cardizem gtt. Respiratory rate 25-35. Patient states he is frustrated with being Covid positive and appears dishearten as he says this. Patient provided emotional support. Patient encouraged to self prone. Patient educated on ways to help slow his breathing and control his breathing, if he is to get short of breath. Patient also educated to notify staff if he has any changes in his breathing such as, increased work of breathing, difficulty breathing, or any other changes. At this time patient appears to have intermittent increased work of breathing, but is able to control. Lung sounds are clear in all lung fields.   RN to call rapid response for further needs.   Casimer Bilis

## 2019-10-17 NOTE — ED Notes (Signed)
Pt placed on hospital bed for comfort.

## 2019-10-18 ENCOUNTER — Inpatient Hospital Stay (HOSPITAL_COMMUNITY): Payer: Medicare HMO

## 2019-10-18 DIAGNOSIS — I340 Nonrheumatic mitral (valve) insufficiency: Secondary | ICD-10-CM

## 2019-10-18 DIAGNOSIS — J9601 Acute respiratory failure with hypoxia: Secondary | ICD-10-CM

## 2019-10-18 DIAGNOSIS — U071 COVID-19: Secondary | ICD-10-CM

## 2019-10-18 DIAGNOSIS — I361 Nonrheumatic tricuspid (valve) insufficiency: Secondary | ICD-10-CM

## 2019-10-18 LAB — POCT I-STAT 7, (LYTES, BLD GAS, ICA,H+H)
Acid-base deficit: 9 mmol/L — ABNORMAL HIGH (ref 0.0–2.0)
Acid-base deficit: 17 mmol/L — ABNORMAL HIGH (ref 0.0–2.0)
Bicarbonate: 17.8 mmol/L — ABNORMAL LOW (ref 20.0–28.0)
Bicarbonate: 9.5 mmol/L — ABNORMAL LOW (ref 20.0–28.0)
Calcium, Ion: 1.12 mmol/L — ABNORMAL LOW (ref 1.15–1.40)
Calcium, Ion: 0.92 mmol/L — ABNORMAL LOW (ref 1.15–1.40)
HCT: 42 % (ref 39.0–52.0)
HCT: 46 % (ref 39.0–52.0)
Hemoglobin: 14.3 g/dL (ref 13.0–17.0)
Hemoglobin: 15.6 g/dL (ref 13.0–17.0)
O2 Saturation: 100 %
O2 Saturation: 100 %
Patient temperature: 97.7
Patient temperature: 96.1
Potassium: 5 mmol/L (ref 3.5–5.1)
Potassium: 6.8 mmol/L (ref 3.5–5.1)
Sodium: 139 mmol/L (ref 135–145)
Sodium: 139 mmol/L (ref 135–145)
TCO2: 19 mmol/L — ABNORMAL LOW (ref 22–32)
TCO2: 10 mmol/L — ABNORMAL LOW (ref 22–32)
pCO2 arterial: 40.2 mmHg (ref 32.0–48.0)
pCO2 arterial: 23.6 mmHg — ABNORMAL LOW (ref 32.0–48.0)
pH, Arterial: 7.252 — ABNORMAL LOW (ref 7.350–7.450)
pH, Arterial: 7.204 — ABNORMAL LOW (ref 7.350–7.450)
pO2, Arterial: 424 mmHg — ABNORMAL HIGH (ref 83.0–108.0)
pO2, Arterial: 301 mmHg — ABNORMAL HIGH (ref 83.0–108.0)

## 2019-10-18 LAB — COMPREHENSIVE METABOLIC PANEL
ALT: 56 U/L — ABNORMAL HIGH (ref 0–44)
AST: 76 U/L — ABNORMAL HIGH (ref 15–41)
Albumin: 3.2 g/dL — ABNORMAL LOW (ref 3.5–5.0)
Alkaline Phosphatase: 88 U/L (ref 38–126)
Anion gap: 17 — ABNORMAL HIGH (ref 5–15)
BUN: 60 mg/dL — ABNORMAL HIGH (ref 6–20)
CO2: 14 mmol/L — ABNORMAL LOW (ref 22–32)
Calcium: 8.7 mg/dL — ABNORMAL LOW (ref 8.9–10.3)
Chloride: 109 mmol/L (ref 98–111)
Creatinine, Ser: 4.17 mg/dL — ABNORMAL HIGH (ref 0.61–1.24)
GFR calc Af Amer: 17 mL/min — ABNORMAL LOW (ref >60)
GFR calc non Af Amer: 14 mL/min — ABNORMAL LOW (ref >60)
Glucose, Bld: 105 mg/dL — ABNORMAL HIGH (ref 70–99)
Potassium: 5.7 mmol/L — ABNORMAL HIGH (ref 3.5–5.1)
Sodium: 140 mmol/L (ref 135–145)
Total Bilirubin: 2.7 mg/dL — ABNORMAL HIGH (ref 0.3–1.2)
Total Protein: 6.1 g/dL — ABNORMAL LOW (ref 6.5–8.1)

## 2019-10-18 LAB — BLOOD GAS, ARTERIAL
Acid-base deficit: 11.9 mmol/L — ABNORMAL HIGH (ref 0.0–2.0)
Bicarbonate: 13.8 mmol/L — ABNORMAL LOW (ref 20.0–28.0)
Drawn by: 213381
FIO2: 44
O2 Saturation: 97 %
Patient temperature: 35.6
pCO2 arterial: 30.1 mmHg — ABNORMAL LOW (ref 32.0–48.0)
pH, Arterial: 7.274 — ABNORMAL LOW (ref 7.350–7.450)
pO2, Arterial: 107 mmHg (ref 83.0–108.0)

## 2019-10-18 LAB — RENAL FUNCTION PANEL
Albumin: 3 g/dL — ABNORMAL LOW (ref 3.5–5.0)
Anion gap: 19 — ABNORMAL HIGH (ref 5–15)
BUN: 69 mg/dL — ABNORMAL HIGH (ref 6–20)
CO2: 15 mmol/L — ABNORMAL LOW (ref 22–32)
Calcium: 8.6 mg/dL — ABNORMAL LOW (ref 8.9–10.3)
Chloride: 108 mmol/L (ref 98–111)
Creatinine, Ser: 4.43 mg/dL — ABNORMAL HIGH (ref 0.61–1.24)
GFR calc Af Amer: 16 mL/min — ABNORMAL LOW (ref >60)
GFR calc non Af Amer: 13 mL/min — ABNORMAL LOW (ref >60)
Glucose, Bld: 114 mg/dL — ABNORMAL HIGH (ref 70–99)
Phosphorus: 6.8 mg/dL — ABNORMAL HIGH (ref 2.5–4.6)
Potassium: 6.1 mmol/L — ABNORMAL HIGH (ref 3.5–5.1)
Sodium: 142 mmol/L (ref 135–145)

## 2019-10-18 LAB — COOXEMETRY PANEL
Carboxyhemoglobin: 1 % (ref 0.5–1.5)
Methemoglobin: 1.2 % (ref 0.0–1.5)
O2 Saturation: 41.9 %
Total hemoglobin: 14.3 g/dL (ref 12.0–16.0)

## 2019-10-18 LAB — CBC
HCT: 44.3 % (ref 39.0–52.0)
Hemoglobin: 13.5 g/dL (ref 13.0–17.0)
MCH: 24.4 pg — ABNORMAL LOW (ref 26.0–34.0)
MCHC: 30.5 g/dL (ref 30.0–36.0)
MCV: 80 fL (ref 80.0–100.0)
Platelets: 199 10*3/uL (ref 150–400)
RBC: 5.54 MIL/uL (ref 4.22–5.81)
RDW: 14.8 % (ref 11.5–15.5)
WBC: 6.6 10*3/uL (ref 4.0–10.5)
nRBC: 0.8 % — ABNORMAL HIGH (ref 0.0–0.2)

## 2019-10-18 LAB — ECHOCARDIOGRAM COMPLETE
Height: 71 in
Weight: 3026.47 oz

## 2019-10-18 LAB — MRSA PCR SCREENING: MRSA by PCR: NEGATIVE

## 2019-10-18 LAB — APTT: aPTT: 69 seconds — ABNORMAL HIGH (ref 24–36)

## 2019-10-18 LAB — HEPARIN LEVEL (UNFRACTIONATED): Heparin Unfractionated: 0.64 IU/mL (ref 0.30–0.70)

## 2019-10-18 LAB — MAGNESIUM: Magnesium: 2.2 mg/dL (ref 1.7–2.4)

## 2019-10-18 MED ORDER — CHLORHEXIDINE GLUCONATE CLOTH 2 % EX PADS
6.0000 | MEDICATED_PAD | Freq: Every day | CUTANEOUS | Status: DC
  Administered 2019-10-18 – 2019-10-21 (×4): 6 via TOPICAL

## 2019-10-18 MED ORDER — BISACODYL 10 MG RE SUPP
10.0000 mg | Freq: Every day | RECTAL | Status: DC | PRN
  Filled 2019-10-18: qty 1

## 2019-10-18 MED ORDER — HEPARIN SODIUM (PORCINE) 1000 UNIT/ML DIALYSIS
1000.0000 [IU] | INTRAMUSCULAR | Status: DC | PRN
  Administered 2019-10-20 (×2): 1200 [IU] via INTRAVENOUS_CENTRAL
  Filled 2019-10-18 (×2): qty 6
  Filled 2019-10-18: qty 3
  Filled 2019-10-18: qty 6

## 2019-10-18 MED ORDER — HEPARIN (PORCINE) 2000 UNITS/L FOR CRRT
INTRAVENOUS_CENTRAL | Status: DC | PRN
  Filled 2019-10-18: qty 1000

## 2019-10-18 MED ORDER — PHENYLEPHRINE 40 MCG/ML (10ML) SYRINGE FOR IV PUSH (FOR BLOOD PRESSURE SUPPORT)
40.0000 ug | PREFILLED_SYRINGE | Freq: Once | INTRAVENOUS | Status: AC
  Administered 2019-10-18: 40 ug via INTRAVENOUS

## 2019-10-18 MED ORDER — PHENYLEPHRINE HCL-NACL 10-0.9 MG/250ML-% IV SOLN
0.0000 ug/min | INTRAVENOUS | Status: DC
  Administered 2019-10-18: 20 ug/min via INTRAVENOUS
  Administered 2019-10-18: 70 ug/min via INTRAVENOUS
  Filled 2019-10-18 (×3): qty 250

## 2019-10-18 MED ORDER — FENTANYL CITRATE (PF) 100 MCG/2ML IJ SOLN
50.0000 ug | INTRAMUSCULAR | Status: AC | PRN
  Administered 2019-10-19 (×2): 50 ug via INTRAVENOUS

## 2019-10-18 MED ORDER — FUROSEMIDE 10 MG/ML IJ SOLN
80.0000 mg | Freq: Two times a day (BID) | INTRAMUSCULAR | Status: DC
  Administered 2019-10-18: 80 mg via INTRAVENOUS
  Filled 2019-10-18: qty 8

## 2019-10-18 MED ORDER — DEXMEDETOMIDINE HCL IN NACL 400 MCG/100ML IV SOLN
0.0000 ug/kg/h | INTRAVENOUS | Status: DC
  Administered 2019-10-18: 0.6 ug/kg/h via INTRAVENOUS
  Administered 2019-10-18: 1.2 ug/kg/h via INTRAVENOUS
  Administered 2019-10-19: 1 ug/kg/h via INTRAVENOUS
  Administered 2019-10-19: 0.8 ug/kg/h via INTRAVENOUS
  Administered 2019-10-19: 1 ug/kg/h via INTRAVENOUS
  Administered 2019-10-19: 0.8 ug/kg/h via INTRAVENOUS
  Administered 2019-10-19: 1 ug/kg/h via INTRAVENOUS
  Administered 2019-10-20: 1.1 ug/kg/h via INTRAVENOUS
  Administered 2019-10-20 (×2): 1 ug/kg/h via INTRAVENOUS
  Administered 2019-10-21 – 2019-10-22 (×9): 1.2 ug/kg/h via INTRAVENOUS
  Filled 2019-10-18 (×14): qty 100
  Filled 2019-10-18: qty 200
  Filled 2019-10-18 (×2): qty 100
  Filled 2019-10-18: qty 200
  Filled 2019-10-18: qty 100

## 2019-10-18 MED ORDER — MIDAZOLAM HCL 2 MG/2ML IJ SOLN
INTRAMUSCULAR | Status: AC
  Administered 2019-10-18: 2 mg
  Filled 2019-10-18: qty 2

## 2019-10-18 MED ORDER — PHENYLEPHRINE HCL-NACL 10-0.9 MG/250ML-% IV SOLN
INTRAVENOUS | Status: AC
  Administered 2019-10-18: 20:00:00
  Filled 2019-10-18: qty 250

## 2019-10-18 MED ORDER — SODIUM CHLORIDE 0.9 % IV SOLN
300.0000 [IU]/h | INTRAVENOUS | Status: DC
  Administered 2019-10-18 – 2019-10-22 (×4): 300 [IU]/h via INTRAVENOUS_CENTRAL
  Filled 2019-10-18: qty 10000
  Filled 2019-10-18: qty 2
  Filled 2019-10-18: qty 10000
  Filled 2019-10-18 (×2): qty 2

## 2019-10-18 MED ORDER — PHENYLEPHRINE HCL-NACL 10-0.9 MG/250ML-% IV SOLN
INTRAVENOUS | Status: AC
  Administered 2019-10-18: 22:00:00
  Filled 2019-10-18: qty 250

## 2019-10-18 MED ORDER — PRISMASOL BGK 4/2.5 32-4-2.5 MEQ/L IV SOLN
INTRAVENOUS | Status: DC
  Administered 2019-10-18 – 2019-10-22 (×36): via INTRAVENOUS_CENTRAL
  Filled 2019-10-18 (×33): qty 5000

## 2019-10-18 MED ORDER — FENTANYL CITRATE (PF) 100 MCG/2ML IJ SOLN
INTRAMUSCULAR | Status: AC
  Administered 2019-10-18: 100 ug
  Filled 2019-10-18: qty 2

## 2019-10-18 MED ORDER — NOREPINEPHRINE 4 MG/250ML-% IV SOLN
0.0000 ug/min | INTRAVENOUS | Status: DC
  Administered 2019-10-19: 16 ug/min via INTRAVENOUS
  Administered 2019-10-19: 8 ug/min via INTRAVENOUS
  Administered 2019-10-19: 12 ug/min via INTRAVENOUS
  Administered 2019-10-19 – 2019-10-20 (×2): 16 ug/min via INTRAVENOUS
  Filled 2019-10-18 (×7): qty 250

## 2019-10-18 MED ORDER — FENTANYL CITRATE (PF) 100 MCG/2ML IJ SOLN
50.0000 ug | INTRAMUSCULAR | Status: DC | PRN
  Administered 2019-10-18: 50 ug via INTRAVENOUS
  Filled 2019-10-18: qty 2

## 2019-10-18 MED ORDER — MIDAZOLAM HCL 2 MG/2ML IJ SOLN
INTRAMUSCULAR | Status: AC
  Filled 2019-10-18: qty 2

## 2019-10-18 MED ORDER — PRISMASOL BGK 0/2.5 32-2.5 MEQ/L REPLACEMENT SOLN
Status: DC
  Administered 2019-10-18 – 2019-10-19 (×2): via INTRAVENOUS_CENTRAL
  Filled 2019-10-18 (×4): qty 5000

## 2019-10-18 MED ORDER — PRISMASOL BGK 4/2.5 32-4-2.5 MEQ/L REPLACEMENT SOLN
Status: DC
  Filled 2019-10-18 (×2): qty 5000

## 2019-10-18 MED ORDER — FENTANYL 2500MCG IN NS 250ML (10MCG/ML) PREMIX INFUSION
0.0000 ug/h | INTRAVENOUS | Status: DC
  Administered 2019-10-18: 25 ug/h via INTRAVENOUS
  Administered 2019-10-20: 100 ug/h via INTRAVENOUS
  Administered 2019-10-21: 75 ug/h via INTRAVENOUS
  Filled 2019-10-18 (×3): qty 250

## 2019-10-18 MED ORDER — ROCURONIUM BROMIDE 50 MG/5ML IV SOLN
100.0000 mg | Freq: Once | INTRAVENOUS | Status: AC
  Administered 2019-10-18: 100 mg via INTRAVENOUS

## 2019-10-18 MED ORDER — FENTANYL CITRATE (PF) 100 MCG/2ML IJ SOLN
INTRAMUSCULAR | Status: AC
  Filled 2019-10-18: qty 2

## 2019-10-18 MED ORDER — MIDAZOLAM HCL 2 MG/2ML IJ SOLN
2.0000 mg | Freq: Once | INTRAMUSCULAR | Status: AC
  Administered 2019-10-18: 2 mg via INTRAVENOUS

## 2019-10-18 MED ORDER — PRISMASOL BGK 0/2.5 32-2.5 MEQ/L REPLACEMENT SOLN
Status: DC
  Administered 2019-10-18 – 2019-10-19 (×2): via INTRAVENOUS_CENTRAL
  Filled 2019-10-18 (×3): qty 5000

## 2019-10-18 MED ORDER — ETOMIDATE 2 MG/ML IV SOLN
20.0000 mg | Freq: Once | INTRAVENOUS | Status: AC
  Administered 2019-10-18: 20 mg via INTRAVENOUS

## 2019-10-18 MED ORDER — SODIUM CHLORIDE 0.9 % IV SOLN
300.0000 [IU]/h | INTRAVENOUS | Status: DC
  Filled 2019-10-18: qty 2

## 2019-10-18 NOTE — Progress Notes (Signed)
FPTS Interim Progress Note  S:Received page from RN that patient had rapid response called. Patient is currently on 15L HFNC but desatting with any movement, such as turning to the side. Went up to evaluate, Dr. Caron Presume at bedside as well. Patient appears to be working harder to breathe. Reports he was able to rest more but does note some SOB. Denies chest pain.   Of note patient admitted for AoC CHF exacerbation as well as Afib with RVR. Cardiology consulted on admission and patient was planned to diurese with IV lasix $Remove'80mg'OxwvuNM$  bid. RN notes poor output this AM. ON notes reporting some tachypnea and desaturations ON.   O: BP (!) 128/107   Pulse (!) 104   Temp 98.4 F (36.9 C) (Oral)   Resp (!) 35   Ht $R'5\' 11"'fy$  (1.803 m)   Wt 85.8 kg   SpO2 97%   BMI 26.38 kg/m   Gen: awake and alert, laying in bed Resp: CTAB, increased WOB, accessory muscle use Cardio: RRR  A/P: Acute hypoxic respiratory failure Multifactorial. COVID positive as well as initially admitted for AoC heart failure. Does not seem to be responding to IV lasix, only 100cc UOP since last dose. Continues to desat with minimal movement, turning to L side, despite 15L HFNC. Given worsening respiratory status and increased WOB will plan to call CCM for consult. RN notes somewhat more confused this AM, will obtain ABG. DG chest from this AM showing hazy multifocal opacities throughout both lungs (R>L), suspicious for multifocal pneumonia.  -CCM consulted. Spoke to Dr. Ander Slade (CCM) who states they will come see patient. No new orders needed at this time -ABG, RT paged to obtain -continuous pulse ox  Caroline More, DO 10/18/2019, 8:01 AM PGY-3, [place] [facility] Service pager [phone]

## 2019-10-18 NOTE — Progress Notes (Signed)
Called sister again, listed as patient contact [phone] Left message to call us back

## 2019-10-18 NOTE — Progress Notes (Signed)
PT Cancellation Note  Patient Details Name: Paul [name] MRN: [id number] DOB: Jun 25, 1961   Cancelled Treatment:    Reason Eval/Treat Not Completed: (P) Medical issues which prohibited therapy Pt has a decline in medical status, he is on 15L HFNC and is transferring to ICU. PT will follow back tomorrow to check on appropriateness of treatment.  Johari Bennetts B. Migdalia Dk PT, DPT Acute Rehabilitation Services Pager [phone] Office [phone]    [place] 10/18/2019, 9:29 AM

## 2019-10-18 NOTE — Progress Notes (Signed)
Upon shift change, patient removed all telemetry leads and oxygen. Patient's saturations in low 80's. Rapid response nurse and Dr. Caron Presume paged to come to bedside to assess patient.

## 2019-10-18 NOTE — Progress Notes (Signed)
Family medicine progress note  Saw and evaluated patient.  Patient is resting comfortably with all the lights off in the room and minimal noise. He was fast asleep when I checked on him.  Satting in the 90s on 6 L nasal cannula.  Again I think that is significant change in respiratory status is more driven by worsening Covid and anxiety at this point.  Patient is anxiety is best managed by limiting number of people in the room, not directly talking about his various medical problems in front of him, and speaking in a reassuring but firm tone.  Full Daily progress note to follow, patient appears to have improved markedly with his anxiety since last check.  Guadalupe Dawn MD PGY-3 Family Medicine Resident

## 2019-10-18 NOTE — Procedures (Signed)
Arterial Catheter Insertion Procedure Note Paul [name] [id number] 03-16-1961  Procedure: Insertion of Arterial Catheter  Indications: Blood pressure monitoring and Frequent blood sampling  Procedure Details Consent: Unable to obtain consent because of emergent medical necessity. Time Out: Verified patient identification, verified procedure, site/side was marked, verified correct patient position, special equipment/implants available, medications/allergies/relevent history reviewed, required imaging and test results available.  Performed  Maximum sterile technique was used including antiseptics, cap, gloves, gown, hand hygiene, mask and sheet. Skin prep: Chlorhexidine; local anesthetic administered 20 gauge catheter was inserted into right femoral artery using the Seldinger technique. ULTRASOUND GUIDANCE USED: YES Evaluation Blood flow good; BP tracing good. Complications: No apparent complications.   Georgann Housekeeper, AGACNP-BC [place]  See Amion for personal pager PCCM on call pager [phone]  10/18/2019 10:38 PM

## 2019-10-18 NOTE — Progress Notes (Signed)
2 RT's attempted to place an A-line 3 times.  Pulse hard to find even with Doppler.

## 2019-10-18 NOTE — Progress Notes (Signed)
Family Medicine Teaching Service Daily Progress Note Intern Pager: [phone]  Patient name: Paul [name] Medical record number: [id number] Date of birth: 1960/11/09 Age: 58 y.o. Gender: male  Primary Care Provider: [name], [name], DO Consultants: Cardiology Code Status: Full code  Pt Overview and Major Events to Date:  10/11/2019-patient admitted for CHF exacerbation  Assessment and Plan: ZEB RAWL is a 58 y.o. male presenting with shortness of breath and edema. PMH is significant forthe CHF, CADs/p LAD DES in 03/2015, hypertension, PAF, hyperlipidemia, CKD 4, tobacco useandarthritis.  COVID 19 Patient presented with shortness of breath on 12/9 and was found to be in A. fib with RVR.  Rapid Covid antigen test was negative.  Initial chest x-ray showed cardiomegaly with pulmonary edema.  The patient was admitted and then transferred to the 5 W. Covid unit.  Repeat chest x-ray on 12/10 showed development of hazy multifocal opacities throughout both lungs, more prominent on the right, suspicious for multifocal pneumonia.  Overnight the patient had issues with worsening shortness of breath and new oxygen requirement which was multifactorial most likely related to worsening Covid status along with anxiety regarding his new diagnosis.  The patient was placed on 5 L O2 and started on dexamethasone and remdesivir.  Patient was also given 2 doses of Ativan to help with anxiety.  This morning I was called to the patient's room because patient was having desaturations and was on 15 L high flow nasal cannula.  Patient was more lethargic than previous exams.  Respiratory distress was essentially unchanged from previous exams.  CCM was consulted -CCM is going to take over as primary and patient has been transferred to the ICU    Acute on chronic CHF, A. fib with RVR Patient presented with 5 days of shortness of breath, edema, fatigue.  Chest x-ray showed cardiomegaly with pulmonary edema.   Echo 10/6 showed EF 30-35%.  Troponin on admission elevated at 433 likely secondary to demand ischemia.  Troponin trended to 295.  Patient follows with Dr. Irish Lack as primary cardiologist and has seen Dr. Lovena Le.  Cardiology is following.  BNP 2601.7, TSH 1.033.  No plan for right heart cath at this time per cardiology -Vitals per routine -Continuous pulse ox and telemetry -Cardiology following, recommendations appreciated -Metoprolol 6.25 mg 4 times daily, titrate as necessary -Planning for TEE and DCCV prior to discharge if patient does not convert chemically. -Can transition from heparin GTT to NOAC prior to discharge -Repeat cardiac echo ordered by cardiology -Cardiology is considering role of ICD therapy -Consider amyloidosis work-up -Continue IV Lasix and titrate based on CMP -Heparin drip per pharmacy consult, CHA2DS2-VASc 3 -Daily weights -Strict I's and O's  CAD/ACS rule out  Denies chest pain Trop 433 today, EKG  Afib with RVR, no ischemic changes S/p stent 2016, patent LAD stent. Echo in 08/14/2019 showed EF 30-35% Home meds: Aspirin 81mg , Carvedilol 3.125mg  BID  -Continue home Aspirin -Hold home Carvediliol -Metoprolol 6.25 mg QID, titrate as needed, per Cardiology recs -Continue ACS rule out, trend Troponins until flat   Hypertension BPs soft in ED, 088-110R systolic Home meds: Carvedilol 3.125mg  BID, Lasix 40mg  once daily -Hold Carvedilol -IV Lasix per cardiology  AKI on CKD Stage IV Cr on admission 2.84, creatinine today 2.65,Baseline Cr ~1.8 -Avoid nephrotoxic agents -AM CMP -Continue to monitor while diuresing  Scleral icterus  Obvious scleral icterus on examination, pt denies noticing this. Diffuse tenderness on abdominal exam. No guarding or rebound tenderness, no obvious organomegaly.  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  Patient was desaturation when turned on the side and required high flow nasal cannula O2.  When I enter the room is a 15 L.  He was more lethargic than previous examinations yesterday.  He was tachypneic.  Ordered an ABG and consulted CCM.  CCM is going to take over his primary and patient was transferred to the ICU.  Objective: Temp:  [97.5 F (36.4 C)-101 F (38.3 C)] 98.4 F (36.9 C) (12/10 0425) Pulse Rate:  [64-119] 104 (12/10 0200) Resp:  [18-46] 31 (12/10 0615) BP: (96-151)/(67-128) 114/98 (12/10 0422) SpO2:  [78 %-100 %] 96 % (12/10 0615) Weight:  [82.9 kg-85.8 kg] 85.8 kg (12/10 0316) General: In acute distress, lethargic, answers questions appropriately HEENT: Atraumatic. Normocephalic.  Neck: No cervical lymphadenopathy.  Cardiac: RRR, no m/r/g Respiratory: CTAB, increased work of breathing.  On high flow nasal cannula oxygen. Abdomen: soft, nontender, nondistended, bowel sounds normal Skin: warm and dry, no rashes noted Neuro: Lethargic   Laboratory: Recent Labs  Lab 10/24/2019 1749 10/17/19 0528  WBC 4.5 4.2  HGB 12.7* 10.9*  HCT 41.4 35.5*  PLT 189 146*   Recent Labs  Lab 11/01/2019 1749 10/17/19 0528  NA 142 143  K 4.7 3.9  CL 111 111  CO2 22 19*  BUN 46* 45*  CREATININE 2.84* 2.65*  CALCIUM 8.5* 8.1*  PROT  --  5.5*  BILITOT  --  2.3*  ALKPHOS  --  65  ALT  --  28  AST  --  32  GLUCOSE 156* 137*   EKG-A. fib with RVR BNP-2601.7 Troponin-433> 295 Heparin unfractionated-0.87 TSH-1.033 UA-normal  Imaging/Diagnostic Tests: DG Chest Port 1  View  Result Date: 10/18/2019 CLINICAL DATA:  Increased shortness of breath.  COVID-19 EXAM: PORTABLE CHEST 1 VIEW COMPARISON:  Radiograph 10/21/2019 FINDINGS: Unchanged cardiomegaly. Unchanged mediastinal contours. Development of hazy multifocal opacities throughout both lungs, more prominent on the right. Probable underlying pulmonary edema. No definite pleural effusion. No pneumothorax. IMPRESSION: 1. Development of hazy multifocal opacities throughout both lungs, more prominent on the right, suspicious for multifocal pneumonia. 2. Unchanged cardiomegaly. Probable pulmonary edema. Electronically Signed   By: Keith Rake M.D.   On: 10/18/2019 01:06     Gifford Shave, MD 10/18/2019, 6:19 AM PGY-1, [place] Intern pager: [phone], text pages welcome

## 2019-10-18 NOTE — Progress Notes (Signed)
Updated patients son Herald Vallin about his condition- [phone]

## 2019-10-18 NOTE — Progress Notes (Signed)
[place] for Heparin  Indication: atrial fibrillation  Allergies  Allergen Reactions  . 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 Ibuprofen Other (See Comments)    Kidney damage    Patient Measurements: Height: 5\' 11"  (180.3 cm) Weight: 189 lb 2.5 oz (85.8 kg) IBW/kg (Calculated) : 75.3 Heparin Dosing Weight: 87.5 kg  Vital Signs: Temp: 98.4 F (36.9 C) (12/10 0425) Temp Source: Oral (12/10 0425) BP: 128/107 (12/10 0800) Pulse Rate: 104 (12/10 0200)  Labs: Recent Labs    11/04/2019 1749 10/17/19 0528 10/17/19 1722  HGB 12.7* 10.9*  --   HCT 41.4 35.5*  --   PLT 189 146*  --   HEPARINUNFRC  --  0.87* 0.26*  CREATININE 2.84* 2.65*  --   TROPONINIHS 433* 295*  --     Estimated Creatinine Clearance: 31.6 mL/min (A) (by C-G formula based on SCr of 2.65 mg/dL (H)).   Assessment: 75 yom that presented to the ED in A-fib w/ RVR. Patient's CHA2DS2-VAsc score is > 2. With this pharmacy has been asked to dose heparin at this time.   Heparin level this morning is therapeutic after a rate increase yesterday evening (HL 0.64 << 0.26, goal of 0.3-0.7). CBC wnl.   Goal of Therapy:  Heparin level 0.3-0.7 units/ml Monitor platelets by anticoagulation protocol: Yes   Plan:  - Continue Heparin at 1300 units/hr (13 ml/hr) - Will continue to monitor for any signs/symptoms of bleeding and will follow up with heparin level in 8 hours to confirm  Thank you for allowing pharmacy to be a part of this patient's care.  Alycia Rossetti, PharmD, BCPS Clinical Pharmacist Clinical phone for 10/18/2019: [phone] 10/18/2019 8:19 AM   **Pharmacist phone directory can now be found on [email or web address] ([email or web address]).  Listed under [place].

## 2019-10-18 NOTE — Progress Notes (Signed)
Made attempts to update family about Paul [name]'s condition  Called sister's phone number  Called brother's phone number  Called son  Generic voicemail boxes Requested call back    Patient is critically ill Was emergently intubated this morning  He has acute kidney injury and emergently requires dialysis  Will place dialysis catheter in the patient's best interest

## 2019-10-18 NOTE — Progress Notes (Addendum)
eLink Physician-Brief Progress Note Patient Name: Paul [name] DOB: 02-10-1961 MRN: [id number]   Date of Service  10/18/2019  HPI/Events of Note  Patient needs bilateral soft wrist restraints for safety. RN also requesting arterial line for blood drawing access and Fentanyl infusion for better sedation on the ventilator.  eICU Interventions  Restraints ordered. Fentanyl infusion ordered. Arterial line ordered.        Kerry Kass [name] 10/18/2019, 7:39 PM

## 2019-10-18 NOTE — Progress Notes (Signed)
10/17/2019: During shift change, patient was experiencing tachypnea with respirations in the 30's to 40's following his removal of oxygen via nasal cannula. The [place] was already in the room and asked for a high flow nasal cannula to be brought to the patient's room. High flow nasal cannula was not available on the floor, so a non-rebreather was applied. Patient began ripping off the non-rebreather mask, but after explaining we needed to get his oxygen level back up, the patient still kept trying to pull it off of him because he did not like the mask. High flow nasal cannula was sent to the floor by respiratory and the patient was put on 5 liters of oxygen, where his oxygen saturation returned to the 90's. Paged Patel,MD with family medicine teaching services to notify them of the patient's episode and that his oxygen saturation level had recovered, but the patient remained very anxious. PO ativan was ordered in addition to IV remdesivir and PO decadron. David with rapid response came to round on this patient during this time and stated that he believed this is how the patient was previously and that his desaturation and episode of respiratory distress was likely contributed to the patient's anxiety. Following the PO ativan, the patient was able to rest in his room.  10/17/2019-10/18/2019 Upon entry into the patient's room at 2300, the patient respirations were in the 30's to 40's with oxygen saturations in the 70's-80's. RN's attempted to apply a venturi mask to the patient, but he refused and started yelling "I don't want that. I don't want the mask." RNs educated the patient about the need for the oxygen mask to improve his saturations, but he continuously refused. Patient continued to be anxious and refusing to wear any oxygen masks and would only wear the high flow nasal cannula, which had been increased to 15 liters per minute. Patient requested to have the nasal cannula placed in his mouth rather  than his nose because it was more comfortable. The patient is also a mouth-breather. Re-notified Patel,MD with family medicine teaching services. MD returned page and placed order for 80 mg of IV lasix. IV lasix given at 0001. They also placed placed a second order for $RemoveB'1mg'CUFlUYHx$  of IV ativan, which was administered at [place]. The MD's also ordered a STAT chest x-ray. Condom cath was applied and after about 1 hour of being in the patient's room, he still had zero urine output. Lilia Pro, Respiratory Therapist was called to come and assess the patient during this time and stated that the patient would benefit from a Bipap, but that is unable to be done due to the patient's COVID diagnosis. Lilia Pro attempted to apply the non-rebreather on the patient and the patient began fighting against her and yelling " I don't want it! I don't want itPosey Pronto, MD was paged again due to the patient's continued increase in work of breathing and this RN's concern that the patient will continue continue to decline. Fletcher,MD with Family Medicine Teaching Services came to the bedside and talked to the patient about his breathing pattern and explained to the patient "You need to take slow deep breaths and breathe in through your nose and out through your mouth." The patient was responsive to [facility] but the patient was no longer talking as clearly as he had earlier in the shift and the MD seemed to notice as well. The patient began talking about making a cake, while his speech was difficult to understand. As Fletcher,MD was talking  to the patient, the patient's respirations decreased from the 40's-50's to the 30's which the MD stated he was fine with. The MD educated the patient about how not to become anxious because he hears an alarm from the machine. The MD also told the patient we could give him some ear plugs in order to help decrease the noise. The patient stated "okay", even though later when offered to him, the patient refused. MD  instructed this RN to place a foley catheter for diuresis. While the MD was talking with the patient, the patient began experiencing difficult breathing again and the MD instructed him again to "slow your breathing and breath in through your nose and out through your mouth." MD also educated patient to "listen to the RN's as they are trying to help you." During this time the patient's right arm began tremoring. This was brought to the MD's attention. Also, while the MD was talking to the patient, the patient coughed and spit up bloody sputum. Fletcher,MD instructed this RN just to monitor for any further bloody sputum.   10/18/2019: After Kris Mouton, MD left the floor, the patient became agitated again and began desatting. He would not listen to any directions provided by this RN and Psychologist, sport and exercise. Also, RNs were unable to get a pulse ox reading on the patient after using multiple sites. Charge RN did a rectal temp on the patient and it was 101. Patient was continuing to experience difficulty breathing with respirations back to the 40's and 50's. At this time, there were 7 RN's in the room working with the patient and trying to get an oxygen saturation on the patient, place a foley catheter, holding the oxygen to the patient's face, and attempting to calm the patient and placing a new IV site. Rapid response RN Shanon Brow notified. He stated he would come to the floor to see the patient. Patel,MD was re-paged and notified of patient's status. Fletcher,MD stated on the phone, "I cannot come up there every time he gets anxious. You guys need to talk to him and calm him down." This RN responded by saying that "We have been talking to the patient to try and calm him down, but he is not listening." Patel,MD and Fletcher,MD were both on the phone and stated that they wanted to talk to the patient. The phone was then placed at the patient's ear for him to hear them talking to him. They re-educated the patient on the phone to  calm and slow his breathing. Even after talking to him the patient's respirations were in the 30's the patient was still using his accessory muscles to breathe, just like how Fletcher,MD seen him after coming to the bedside to assess him. Patel,MD and Fletcher,MD informed this RN on the phone "The patient is very anxious. Turn down the lights in the room and just leave the room. Do not have a lot of staff in the room talking about his status/condition in front of him because it makes his anxiety worse. And decrease the alarms on the monitors so they will not beep, so change the parameters on the machine if you have to." Shanon Brow, Rapid Response came into the patient's room during this conversation. No additional recommendations given to change the patient's plan of care. Following this conversation, the patient's respirations continue to fluctuate between 30's-50's with oxygen saturations fluctuating from the 80's-90's on 15 liters of oxygen on a high flow nasal cannula. Tylenol was given to the patient for his fever at  0220 during Epic downtime. MDs aware of the situation. Rechecked patient's temp at 0425 and it was 98.4 orally. Then the patient began to drift off to sleep around 0500. Since the patient received his Lasix during this shift, he only had 75 mL of urinary output. Patel,MD made aware. No new orders placed.

## 2019-10-18 NOTE — Procedures (Signed)
Hemodialysis Catheter Insertion Procedure Note  Procedure: Insertion of Hemodialysis Catheter Indications: Dialysis Access   Procedure Details Consent: Unable to obtain consent because of emergent medical necessity. Time Out: Verified patient identification, verified procedure, site/side was marked, verified correct patient position, special equipment/implants available, medications/allergies/relevent history reviewed, required imaging and test results available.  Performed  Maximum sterile technique was used including antiseptics, cap, gloves, gown, hand hygiene, mask and sheet. Skin prep: Chlorhexidine; local anesthetic administered Triple lumen hemodialysis catheter was inserted into right internal jugular vein using the Seldinger technique.  Evaluation Blood flow good Complications: No apparent complications Patient did tolerate procedure well. Chest X-ray ordered to verify placement.  CXR: pending.   Adewale A Olalere 10/18/2019

## 2019-10-18 NOTE — Progress Notes (Signed)
  Echocardiogram 2D Echocardiogram has been performed.  Johny Chess 10/18/2019, 2:09 PM

## 2019-10-18 NOTE — Significant Event (Addendum)
Rapid Response Event Note  Overview: Time Called: 1505 Arrival Time: 0740 Event Type: Respiratory  Initial Focused Assessment: Patient admitted yesterday increased WOB, SOB. Chronic HF Covid (+) Increasing O2 needs overnight, periods of agitation.  Continuous labored breathing and RR 28-40s BP 128/107  AF 101  RR 35  O2 sat 97 on HFNC 15L Lasix over night with minimal UOP Desat with activity Patient appears to be fatigued but he is responsive and able to answer questions.  Interventions: ABG done CCM consulted Transfer orders received  1000 Transferred to 2M03,             Intubated    Plan of Care (if not transferred):  Event Summary: Name of Physician Notified: Gifford Shave at [place]  Name of Consulting Physician Notified: Ander Slade at [place]  Outcome: Transferred (Comment)  Event End Time: 0830  Raliegh Ip

## 2019-10-18 NOTE — Progress Notes (Signed)
OT Cancellation Note  Patient Details Name: Paul [name] MRN: [id number] DOB: 1961/03/20   Cancelled Treatment:    Reason Eval/Treat Not Completed: Medical issues which prohibited therapy; pt transferring to ICU this AM due to increased oxygen needs. Will follow up for OT eval as schedule permits.   Lou Cal, OT [facility] Pager [phone] Office [phone]   Raymondo Band 10/18/2019, 11:15 AM

## 2019-10-18 NOTE — Progress Notes (Addendum)
Family medicine progress note  Late entry due to epic downtime.  Paged to evaluate patient secondary to tachypnea (RR 20-40), desats, and noncompliance.  When arrived at room patient taking numerous short breaths with some accessory muscle use.  Asked patient why he was breathing like that stated that he had trouble catching his breath.  Instructed the patient to try and take slow, deep breaths.  At this his respiratory rate fell into the low 20s and his oxygen saturations improved to above 97% on 5 L nasal cannula.  Also had patient lifted up in bed so that he could sit upright, which is how he normally rests at baseline 2/2 to his heart failure.  Patient with stable bilateral crackles as noted on previous exams.  Patient expressed anxiety at all of the machines in his room beeping which includes his pulse ox and his heart monitor.  Patient appears to have a lot of underlying anxiety at his current medical issues.  His anxiety creates a hyperventilation phenomenon which then causes desats and his respiratory rate to increase, which further inflates his anxiety which has a spiraling effect on his respiratory status.  Walked into the patient's room and his nasal cannula below his mouth, not in his nostrils.  I explained the importance of keeping his nasal cannula in the proper place.  Discussed getting earplugs for patient so that he cannot hear the machines beeping, which he originally endorsed liking this idea but did not want to keep the earplugs in place once obtained for him.  I spent ~20 minutes in the room discussing with patient the importance of compliance with his treatment and discussing deep, slow breathing exercises to help his anxiety.  Earlier in the night patient received 1 mg p.o. Ativan and 1 mg IV Ativan for the anxiety mentioned above.  There was some benefit from the p.o. Ativan but unfortunately the IV given later did not improve his anxiety.  I believe the patient's anxiety will play a  significant role going forward in his care.  Obtained chest x-ray which revealed new multifocal pneumonia from previous x-ray, likely secondary to his known Covid 19 diagnosis.  The oxygen patient is requiring at this time is likely secondary to worsening of his Covid.  Given that he is requiring oxygen, I started the patient on remdesivir and Decadron.  First dose was received on 12/9 of each medication.  Lastly patient was almost due for his next dose of 80 mg IV Lasix.  Went ahead and gave given his volume overloaded status.  After evaluating patient around 2 hours after receiving the Lasix he had no urine output.  Given the patient will likely need significant ongoing diuresis and he is likely developed some aspect of BPH due to his critical illness, instructed nursing to place a Foley catheter for better measurement of his strict I's and O's.  Again I think patient's worsening respiratory status is largely due to to his worsening anxiety.  He may have some baseline respiratory decline secondary to the worsening Covid as seen on the chest x-ray.  Patient significant triggers to his anxiety appear to be bringing attention to his multiple medical problems, a large amount of people in the room, monitors beeping, and discussion of his COVID-19 diagnosis.  The family medicine team will continue to monitor his situation very closely.  Lastly the patient coughed up some mucus which contained some blood.  We will continue to monitor this but this is not unexpected given his Covid,  heparin drip, and extensive use of oxygen therapy overnight.  Gave reassurance to patient, and we will continue to monitor this.  Guadalupe Dawn MD PGY-3 Family Medicine Resident

## 2019-10-18 NOTE — Procedures (Signed)
Intubation Procedure Note Paul [name] [id number] [birth year]  Procedure: Intubation Indications: Respiratory insufficiency  Procedure Details Consent: Unable to obtain consent because of altered level of consciousness. Time Out: Verified patient identification, verified procedure, site/side was marked, verified correct patient position, special equipment/implants available, medications/allergies/relevent history reviewed, required imaging and test results available.  Performed  MAC and 4 Medications:  Fentanyl 100 mcg Etomidate 20 mg Versed 2 mg NMB rocuronium 50 mg   Covid ++  Evaluation Hemodynamic Status: BP stable throughout; O2 sats: stable throughout Patient's Current Condition: stable Complications: No apparent complications Patient did tolerate procedure well. Chest X-ray ordered to verify placement.  CXR: pending.   Richardson Landry Minor ACNP Maryanna Shape PCCM Pager [phone] till 3 pm If no answer page [phone] 10/18/2019, 11:24 AM

## 2019-10-18 NOTE — Consult Note (Signed)
[facility]  HISTORY AND PHYSICAL  Paul [name] is an 58 y.o. male.    Chief Complaint: respiratory distress d/t COVID  HPI: Pt is a 26 M with a PMH sig for HTN, HLD, HOCM, BiV heart failure with EF 30-35% 05/2019, CAD CKD 3 who is now seen in consultation at the request of PCCM for eval and recs re: AKI in the setting of COVID.  Pt is intubated so history is obtained from chart review.  Briefly, pt was admitted yesterday from clinic when he was seen for hemorrhoids and found to be in Afib with RVR.  Was eventually found to be COVID +.  Overnight had increased WOB and agitation, desatting with activity.  Became increasingly hypoxic and was transferred to ICU where he was promptly intubated.  Cr 2.0 baseline --> 4.17, no urine output even with 80 IV Lasix, and K of 5.7.  On pressor.  On hep and amio gtts.  In this setting we are asked to see.     PMH: Past Medical History:  Diagnosis Date  . Acute diastolic CHF (congestive heart failure), NYHA class 4 ([place]) 03/18/2015  . Arthritis   . Atrial flutter with rapid ventricular response ([place]) 03/13/2018   S/p ablation  . BACK PAIN 10/11/2007   Qualifier: Diagnosis of  By: Darylene Price MD, Elta Guadeloupe    . 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 Biventricular heart failure ([place]) 10/17/2019   TTE 05/2019: EF 30 to 35%. Severe LVH. Asymmetric left ventricular hypertrophy.Left ventricular diastolic restrictive filling pattern.  Right Ventricle: The right ventricular size is mildly enlarged. estimated right ventricular systolic pressure is moderately elevated at 44.0 mmHg. Left atrial size was severely dilated. Right atrial size was moderately dilated  . CAD (coronary artery disease)    a. 03/2015 NSTEMI/PCI: LM nl, LAD 95p (3.5x20 Promus DES), D1 60, D2/3 mild dzs, LCX 66m(plan for staged pci), OM1/2 min irregs, RCA 20p, RPDA 60.  .Marland KitchenCHF  exacerbation ([place] 02/09/2018  . Chronic combined systolic and diastolic CHF (congestive heart failure) (HAguas [place]enas 03/27/2015  . Chronic pain syndrome 05/31/2016   Pain contract signed 05/31/16 Patient gets Percocet 7.5-325 mg #100 q3 months  . Chronic renal insufficiency, stage III (moderate) 04/10/2015  . CKD (chronic kidney disease), stage III   . GERD (gastroesophageal reflux disease)    occ  . Hemorrhoids 10/10/2019  . History of cocaine use 02/18/2018  . HOCM (hypertrophic obstructive cardiomyopathy) ([place]    a. 03/2015 Echo: EF 65-60%, sev LV thickening w/ bright, speckled appearance of IV septum, Gr 2 DD, AoV sclerosis, triv AI, mild MR, LA 759mm^2, RA 4878m^2, aneurysmal IAS, mod TR, PASP 108m43m  . HyMarland Kitchenerbilirubinemia 10/17/2019  . Hypertension    denies  . Hypertensive cardiomyopathy (HCC)[place]/17/2013  . Hypertrophic obstructive cardiomyopathy (HCC)[place]30/2009   ECHO 09/2012 65-766-06%ade 2 diastolic dysfunction    . Hypoproteinemia (HCC)[place]/07/2019  . Microcytic anemia 03/19/2015  . NSTEMI (non-ST elevated myocardial infarction) (HCC)[place]10/2016  . Pacemaker infection (HCC)[place]6/2019  . PAF (paroxysmal atrial fibrillation) (HCC)[place] remote isolated episode - 09/2012.  . ReMarland Kitchenal artery aneurysm (HCC)[place]8/2014  . Renal failure 01/05/2007   Qualifier: Diagnosis of  By: RowaDarylene Price MarkElta Guadeloupe. S/P hip replacement 12/25/2012  . Shortness of breath 02/09/2018  . 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  Thrombosed external hemorrhoid 10/17/2019  . TMJ arthralgia   . 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 Typical atrial flutter ([place]) 02/09/2018  . 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 A-FLUTTER ABLATION N/A 03/13/2018   Procedure: A-FLUTTER ABLATION;  Surgeon: Evans Lance, MD;  Location: [facility];  Service: Cardiovascular;  Laterality: N/A;  . APPENDECTOMY    . CARDIAC CATHETERIZATION N/A 03/19/2015   Procedure: Left Heart Cath and Coronary  Angiography;  Surgeon: [name], MD;  Location: [facility];  Service: Cardiovascular;  Laterality: N/A;  . CARDIAC CATHETERIZATION  03/19/2015   Procedure: Coronary Stent Intervention;  Surgeon: [name], MD;  Location: [facility];  Service: Cardiovascular;;  . CARDIAC CATHETERIZATION N/A 04/09/2015   Procedure: Coronary Stent Intervention;  Surgeon: [name], MD;  Location: [facility];  Service: Cardiovascular;  Laterality: N/A;  . CARDIOVERSION N/A 02/15/2018   Procedure: CARDIOVERSION;  Surgeon: [name], MD;  Location: [facility];  Service: Cardiovascular;  Laterality: N/A;  . HIP SURGERY Left 74   lft   . LAPAROSCOPIC APPENDECTOMY  09/22/2012   Procedure: APPENDECTOMY LAPAROSCOPIC;  Surgeon: Harl Bowie, MD;  Location: [facility];  Service: General;;  . LAPAROSCOPY  09/22/2012   Procedure: LAPAROSCOPY DIAGNOSTIC;  Surgeon: Harl Bowie, MD;  Location: [facility];  Service: General;  Laterality: N/A;  . TEE WITHOUT CARDIOVERSION N/A 02/15/2018   Procedure: TRANSESOPHAGEAL ECHOCARDIOGRAM (TEE);  Surgeon: [name], MD;  Location: [place] [facility];  Service: Cardiovascular;  Laterality: N/A;  . TEE WITHOUT CARDIOVERSION N/A 03/13/2018   Procedure: TRANSESOPHAGEAL ECHOCARDIOGRAM (TEE);  Surgeon: Lelon Perla, MD;  Location[facility];  Service: Cardiovascular;  Laterality: N/A;  . TOTAL HIP ARTHROPLASTY Right 03,01   x2  . TOTAL HIP ARTHROPLASTY WITH HARDWARE REMOVAL Left 08/05/2014   Procedure: TOTAL HIP ARTHROPLASTY WITH HARDWARE REMOVAL;  Surgeon: Kerin Salen, MD;  Location: [facility];  Service: Orthopedics;  Laterality: Left;    Past Medical History:  Diagnosis Date  . Acute diastolic CHF (congestive heart failure), NYHA class 4 ([place]) 03/18/2015  . Arthritis   . Atrial flutter with rapid ventricular response ([place]) 03/13/2018   S/p ablation  . BACK PAIN 10/11/2007   Qualifier: Diagnosis of  By: Darylene Price MD, Elta Guadeloupe    .  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 Biventricular heart failure ([place]) 10/17/2019   TTE 05/2019: EF 30 to 35%. Severe LVH. Asymmetric left ventricular hypertrophy.Left ventricular diastolic restrictive filling pattern.  Right Ventricle: The right ventricular size is mildly enlarged. estimated right ventricular systolic pressure is moderately elevated at 44.0 mmHg. Left atrial size was severely dilated. Right atrial size was moderately dilated  . CAD (coronary artery disease)    a. 03/2015 NSTEMI/PCI: LM nl, LAD 95p (3.5x20 Promus DES), D1 60, D2/3 mild dzs, LCX 41m(plan for staged pci), OM1/2 min irregs, RCA 20p, RPDA 60.  .Marland KitchenCHF exacerbation ([place]ashington [place] 02/09/2018  . Chronic combined systolic and diastolic CHF (congestive heart failure) (HMi-[place] 03/27/2015  . Chronic pain syndrome 05/31/2016   Pain contract signed 05/31/16 Patient gets Percocet 7.5-325 mg #100 q3 months  . Chronic renal insufficiency, stage III (moderate) 04/10/2015  . CKD (chronic kidney disease), stage III   . GERD (gastroesophageal reflux disease)    occ  .  Hemorrhoids 10/10/2019  . History of cocaine use 02/18/2018  . HOCM (hypertrophic obstructive cardiomyopathy) ([place])    a. 03/2015 Echo: EF 65-60%, sev LV thickening w/ bright, speckled appearance of IV septum, Gr 2 DD, AoV sclerosis, triv AI, mild MR, LA 5m/m^2, RA 470mm^2, aneurysmal IAS, mod TR, PASP 5652m.  . HMarland Kitchenperbilirubinemia 10/17/2019  . Hypertension    denies  . Hypertensive cardiomyopathy (HCCWinner1/17/2013  . Hypertrophic obstructive cardiomyopathy (HCCGeorgetown/30/2009   ECHO 09/2012 65-03-00%rade 2 diastolic dysfunction    . Hypoproteinemia (HCCSt. Anne2/07/2019  . Microcytic anemia 03/19/2015  . NSTEMI (non-ST elevated myocardial infarction) (HCCWillow Island/08/2015  . Pacemaker infection (HCCOld Washington/04/2018  . PAF (paroxysmal atrial fibrillation) (HCCMount Orab  remote isolated episode -  09/2012.  . RMarland Kitchennal artery aneurysm (HCCPortland/06/2013  . Renal failure 01/05/2007   Qualifier: Diagnosis of  By: RowDarylene Price, MarElta Guadeloupe . S/P hip replacement 12/25/2012  . Shortness of breath 02/09/2018  . 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 Thrombosed external hemorrhoid 10/17/2019  . TMJ arthralgia   . 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 Typical atrial flutter (HCCSheboygan/02/2018  . 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 aspirin EC  81 mg Oral Daily  . Chlorhexidine Gluconate Cloth  6 each Topical Daily  . dexamethasone  6 mg Oral Daily  . fluticasone  2 spray Each Nare Daily  . furosemide  80 mg Intravenous BID  . hydrocortisone-pramoxine  1 applicator Rectal QID  . loratadine  10 mg Oral Daily  . ramelteon  8 mg Oral QHS  . sodium chloride flush  3 mL Intravenous Once   PRNPQZ:RAQTMAUQJFHLKisacodyl, fentaNYL (SUBLIMAZE) injection, fentaNYL (SUBLIMAZE) injection, ondansetron (ZOFRAN) IV  Medications Prior to Admission  Medication Sig Dispense Refill  . acetaminophen (TYLENOL) 325 MG tablet Take 325 mg by mouth every 6 (six) hours as needed.    . aMarland Kitchenpirin 81 MG tablet Take 1 tablet (81 mg total) by mouth daily. 90 tablet 3  . carvedilol (COREG) 3.125 MG tablet Take 1 tablet (3.125 mg total) by mouth 2 (two) times daily. 180 tablet 3  . fluticasone (FLONASE) 50 MCG/ACT nasal spray Place 2 sprays into both nostrils daily. 16 g 6  . furosemide (LASIX) 40 MG tablet Take one tablet daily for 3 days.  Then take one tablet by mouth as needed for swelling or shortness of breath at night. 90 tablet 3  . loratadine (CLARITIN) 10 MG tablet Take 1 tablet (10 mg total) by mouth daily. 90 tablet 3  . sildenafil (VIAGRA) 100 MG tablet TAKE 1 TABLET BY MOUTH DAILY FOR ERECTILE DYSFUNCTION (30 MINUTES TO 4 HOURS BEFORE SEX). (Patient taking differently: Take 100 mg by mouth daily as needed for erectile dysfunction. ) 90 tablet 0  . traMADol (ULTRAM) 50 MG tablet TAKE 1 TABLET BY  MOUTH EVERY 12 HOURS AS NEEDED FOR MODERATE PAIN (Patient taking differently: Take 50 mg by mouth every 12 (twelve) hours as needed for moderate pain. If adult CrCl > 30, max dose = 400 mg/day; if CrCl < 30, max dose = 200 mg/day and q12h interval.) 60 tablet 0    ALLERGIES:   Allergies  Allergen Reactions  . 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 Ibuprofen Other (See Comments)    Kidney damage    FAM HX: Family History  Problem Relation Age of Onset  . Heart disease Mother   . Heart disease  Father     Social History:   reports that he has been smoking cigarettes. He has a 2.50 pack-year smoking history. He has never used smokeless tobacco. He reports current alcohol use of about 2.0 standard drinks of alcohol per week. He reports that he does not use drugs.  ROS: ROS: not able to be obtained d/t intubatino  Blood pressure 91/66, pulse 73, temperature 97.7 F (36.5 C), temperature source Oral, resp. rate (!) 24, height '5\' 11"'$  (1.803 m), weight 85.8 kg, SpO2 99 %. PHYSICAL EXAM: Physical Exam  GEN: ill, intubated, in ICU HEENT PERRL, EOMI, ETT in place, OGT in place with murky fluid NECK + JVD PULM coarse rhonchi and mechanical breath sounds bilaterally CV tachycardic, regular no r/g ABD nondistended, nontender hypoactive BS EXT no edema, cool hands and feet NEURO sedated SKIN no rashes MSK no effusions   Results for orders placed or performed during the hospital encounter of 10/25/2019 (from the past 48 hour(s))  Basic metabolic panel     Status: Abnormal   Collection Time: 11/06/2019  5:49 PM  Result Value Ref Range   Sodium 142 135 - 145 mmol/L   Potassium 4.7 3.5 - 5.1 mmol/L   Chloride 111 98 - 111 mmol/L   CO2 22 22 - 32 mmol/L   Glucose, Bld 156 (H) 70 - 99 mg/dL   BUN 46 (H) 6 - 20 mg/dL   Creatinine, Ser 2.84 (H) 0.61 - 1.24 mg/dL   Calcium 8.5 (L) 8.9 - 10.3 mg/dL   GFR calc non Af Amer 23 (L) >60 mL/min   GFR calc Af Amer 27 (L) >60  mL/min   Anion gap 9 5 - 15    Comment: Performed at [facility], [address], [place], [place] [address]  CBC     Status: Abnormal   Collection Time: 10/09/2019  5:49 PM  Result Value Ref Range   WBC 4.5 4.0 - 10.5 K/uL   RBC 5.13 4.22 - 5.81 MIL/uL   Hemoglobin 12.7 (L) 13.0 - 17.0 g/dL   HCT 41.4 39.0 - 52.0 %   MCV 80.7 80.0 - 100.0 fL   MCH 24.8 (L) 26.0 - 34.0 pg   MCHC 30.7 30.0 - 36.0 g/dL   RDW 15.2 11.5 - 15.5 %   Platelets 189 150 - 400 K/uL   nRBC 0.7 (H) 0.0 - 0.2 %    Comment: Performed at [facility] [address], [place], [place] [address]  Troponin I (High Sensitivity)     Status: Abnormal   Collection Time: 10/29/2019  5:49 PM  Result Value Ref Range   Troponin I (High Sensitivity) 433 (HH) <18 ng/L    Comment: CRITICAL RESULT CALLED TO, READ BACK BY AND VERIFIED WITH: K.FIELDS RN 1900 10/15/2019 MCCORMICK K (NOTE) Elevated high sensitivity troponin I (hsTnI) values and significant  changes across serial measurements may suggest ACS but many other  chronic and acute conditions are known to elevate hsTnI results.  Refer to the Links section for chest pain algorithms and additional  guidance. Performed at [facility], [place] [address], [place], [place] [address]   POC SARS Coronavirus 2 Ag-ED - Nasal Swab (BD Veritor Kit)     Status: None   Collection Time: 10/17/19  1:17 AM  Result Value Ref Range   SARS Coronavirus 2 Ag NEGATIVE NEGATIVE    Comment: (NOTE) SARS-CoV-2 antigen NOT DETECTED.  Negative results are presumptive.  Negative results do not  preclude SARS-CoV-2 infection and should not be used as the sole basis for treatment or other patient management decisions, including infection  control decisions, particularly in the presence of clinical signs and  symptoms consistent with COVID-19, or in those who have been in contact with the virus.  Negative results must be combined with clinical observations, patient history, and  epidemiological information. The expected result is Negative. Fact Sheet for Patients: PodPark.tn Fact Sheet for Healthcare Providers: GiftContent.is This test is not yet approved or cleared by the [place] FDA and  has been authorized for detection and/or diagnosis of SARS-CoV-2 by FDA under an Emergency Use Authorization (EUA).  This EUA will remain in effect (meaning this test can be used) for the duration of  the COVID-19 de claration under Section 564(b)(1) of the Act, 21 U.S.C. section 360bbb-3(b)(1), unless the authorization is terminated or revoked sooner.   Urinalysis, Routine w reflex microscopic     Status: Abnormal   Collection Time: 10/17/19  2:34 AM  Result Value Ref Range   Color, Urine STRAW (A) YELLOW   APPearance CLEAR CLEAR   Specific Gravity, Urine 1.006 1.005 - 1.030   pH 5.0 5.0 - 8.0   Glucose, UA NEGATIVE NEGATIVE mg/dL   Hgb urine dipstick NEGATIVE NEGATIVE   Bilirubin Urine NEGATIVE NEGATIVE   Ketones, ur NEGATIVE NEGATIVE mg/dL   Protein, ur NEGATIVE NEGATIVE mg/dL   Nitrite NEGATIVE NEGATIVE   Leukocytes,Ua NEGATIVE NEGATIVE    Comment: Performed at [facility] [address] [place], [place] [address]  Troponin I (High Sensitivity)     Status: Abnormal   Collection Time: 10/17/19  5:28 AM  Result Value Ref Range   Troponin I (High Sensitivity) 295 (HH) <18 ng/L    Comment: CRITICAL VALUE NOTED.  VALUE IS CONSISTENT WITH PREVIOUSLY REPORTED AND CALLED VALUE. (NOTE) Elevated high sensitivity troponin I (hsTnI) values and significant  changes across serial measurements may suggest ACS but many other  chronic and acute conditions are known to elevate hsTnI results.  Refer to the Links section for chest pain algorithms and additional  guidance. Performed at [facility], [place] [address], [place], [place] [address]   Brain natriuretic peptide     Status: Abnormal   Collection  Time: 10/17/19  5:28 AM  Result Value Ref Range   B Natriuretic Peptide 2,601.7 (H) 0.0 - 100.0 pg/mL    Comment: Performed at [facility] [address] [place], [place] [address]  CBC     Status: Abnormal   Collection Time: 10/17/19  5:28 AM  Result Value Ref Range   WBC 4.2 4.0 - 10.5 K/uL   RBC 4.43 4.22 - 5.81 MIL/uL   Hemoglobin 10.9 (L) 13.0 - 17.0 g/dL   HCT 35.5 (L) 39.0 - 52.0 %   MCV 80.1 80.0 - 100.0 fL   MCH 24.6 (L) 26.0 - 34.0 pg   MCHC 30.7 30.0 - 36.0 g/dL   RDW 15.3 11.5 - 15.5 %   Platelets 146 (L) 150 - 400 K/uL   nRBC 0.5 (H) 0.0 - 0.2 %    Comment: Performed at [facility] [address] [place], [place] [address]  Heparin level (unfractionated)     Status: Abnormal   Collection Time: 10/17/19  5:28 AM  Result Value Ref Range   Heparin Unfractionated 0.87 (H) 0.30 - 0.70 IU/mL    Comment: (NOTE) If heparin results are below expected values, and patient dosage  has  been confirmed, suggest follow up testing of antithrombin III levels. Performed at [facility], [place] [address] [place], [place] [address]   HIV Antibody (routine testing w rflx)     Status: None   Collection Time: 10/17/19  5:28 AM  Result Value Ref Range   HIV Screen 4th Generation wRfx NON REACTIVE NON REACTIVE    Comment: Performed at [facility] [address], [place], [place] [address]  Comprehensive metabolic panel     Status: Abnormal   Collection Time: 10/17/19  5:28 AM  Result Value Ref Range   Sodium 143 135 - 145 mmol/L   Potassium 3.9 3.5 - 5.1 mmol/L   Chloride 111 98 - 111 mmol/L   CO2 19 (L) 22 - 32 mmol/L   Glucose, Bld 137 (H) 70 - 99 mg/dL   BUN 45 (H) 6 - 20 mg/dL   Creatinine, Ser 2.65 (H) 0.61 - 1.24 mg/dL   Calcium 8.1 (L) 8.9 - 10.3 mg/dL   Total Protein 5.5 (L) 6.5 - 8.1 g/dL   Albumin 2.7 (L) 3.5 - 5.0 g/dL   AST 32 15 - 41 U/L   ALT 28 0 - 44 U/L   Alkaline Phosphatase 65 38 - 126 U/L   Total Bilirubin 2.3 (H) 0.3 - 1.2 mg/dL    GFR calc non Af Amer 25 (L) >60 mL/min   GFR calc Af Amer 29 (L) >60 mL/min   Anion gap 13 5 - 15    Comment: Performed at [facility], [place] [address] [place], [place] [address]  Magnesium     Status: Abnormal   Collection Time: 10/17/19  5:28 AM  Result Value Ref Range   Magnesium 1.6 (L) 1.7 - 2.4 mg/dL    Comment: Performed at [facility] [address], [place], [place] [address]  TSH     Status: None   Collection Time: 10/17/19  5:28 AM  Result Value Ref Range   TSH 1.033 0.350 - 4.500 uIU/mL    Comment: Performed by a 3rd Generation assay with a functional sensitivity of <=0.01 uIU/mL. Performed at [facility], [place] [address] [place], [place] [address]   SARS CORONAVIRUS 2 (TAT 6-24 HRS) Nasopharyngeal Nasopharyngeal Swab     Status: Abnormal   Collection Time: 10/17/19  7:20 AM   Specimen: Nasopharyngeal Swab  Result Value Ref Range   SARS Coronavirus 2 POSITIVE (A) NEGATIVE    Comment: RESULT CALLED TO, READ BACK BY AND VERIFIED WITH: Bronwen Betters RN 14:20 10/17/19 ([email or web address]) (NOTE) SARS-CoV-2 target nucleic acids are DETECTED. The SARS-CoV-2 RNA is generally detectable in upper and lower respiratory specimens during the acute phase of infection. Positive results are indicative of the presence of SARS-CoV-2 RNA. Clinical correlation with patient history and other diagnostic information is  necessary to determine patient infection status. Positive results do not rule out bacterial infection or co-infection with other viruses.  The expected result is Negative. Fact Sheet for Patients: SugarRoll.be Fact Sheet for Healthcare Providers: https://www.woods-mathews.com/ This test is not yet approved or cleared by the [place] FDA and  has been authorized for detection and/or diagnosis of SARS-CoV-2 by FDA under an Emergency Use Authorization (EUA). This EUA will remain  in effect (meaning this test can be used)  for th e duration of the COVID-19 declaration under Section 564(b)(1) of the Act, 21 U.S.C. section 360bbb-3(b)(1), unless the authorization is terminated or revoked sooner. Performed at [facility]  [facility], [place] [address], [place], [place] [address]   Heparin level (unfractionated)     Status: Abnormal   Collection Time: 10/17/19  5:22 PM  Result Value Ref Range   Heparin Unfractionated 0.26 (L) 0.30 - 0.70 IU/mL    Comment: (NOTE) If heparin results are below expected values, and patient dosage has  been confirmed, suggest follow up testing of antithrombin III levels. Performed at [facility], [place] [address] [place], [place] [address]   CBC     Status: Abnormal   Collection Time: 10/18/19  7:35 AM  Result Value Ref Range   WBC 6.6 4.0 - 10.5 K/uL   RBC 5.54 4.22 - 5.81 MIL/uL   Hemoglobin 13.5 13.0 - 17.0 g/dL   HCT 44.3 39.0 - 52.0 %   MCV 80.0 80.0 - 100.0 fL   MCH 24.4 (L) 26.0 - 34.0 pg   MCHC 30.5 30.0 - 36.0 g/dL   RDW 14.8 11.5 - 15.5 %   Platelets 199 150 - 400 K/uL   nRBC 0.8 (H) 0.0 - 0.2 %    Comment: Performed at [facility], [place] [address], [place], [place] [address]  Heparin level (unfractionated)     Status: None   Collection Time: 10/18/19  7:35 AM  Result Value Ref Range   Heparin Unfractionated 0.64 0.30 - 0.70 IU/mL    Comment: (NOTE) If heparin results are below expected values, and patient dosage has  been confirmed, suggest follow up testing of antithrombin III levels. Performed at [facility], [place] [address] [place], [place] [address]   Comprehensive metabolic panel     Status: Abnormal   Collection Time: 10/18/19  7:36 AM  Result Value Ref Range   Sodium 140 135 - 145 mmol/L   Potassium 5.7 (H) 3.5 - 5.1 mmol/L   Chloride 109 98 - 111 mmol/L   CO2 14 (L) 22 - 32 mmol/L   Glucose, Bld 105 (H) 70 - 99 mg/dL   BUN 60 (H) 6 - 20 mg/dL   Creatinine, Ser 4.17 (H) 0.61 - 1.24 mg/dL    Comment: RESULT REPEATED AND VERIFIED    Calcium 8.7 (L) 8.9 - 10.3 mg/dL   Total Protein 6.1 (L) 6.5 - 8.1 g/dL   Albumin 3.2 (L) 3.5 - 5.0 g/dL   AST 76 (H) 15 - 41 U/L   ALT 56 (H) 0 - 44 U/L   Alkaline Phosphatase 88 38 - 126 U/L   Total Bilirubin 2.7 (H) 0.3 - 1.2 mg/dL   GFR calc non Af Amer 14 (L) >60 mL/min   GFR calc Af Amer 17 (L) >60 mL/min   Anion gap 17 (H) 5 - 15    Comment: Performed at [facility], [place] [address] [place], [place] [address]  Magnesium     Status: None   Collection Time: 10/18/19  7:36 AM  Result Value Ref Range   Magnesium 2.2 1.7 - 2.4 mg/dL    Comment: Performed at [facility] [address], [place], [place] [address]  Blood gas, arterial     Status: Abnormal   Collection Time: 10/18/19  8:30 AM  Result Value Ref Range   FIO2 44.00    pH, Arterial 7.274 (L) 7.350 - 7.450   pCO2 arterial 30.1 (L) 32.0 - 48.0 mmHg   pO2, Arterial 107 83.0 - 108.0 mmHg   Bicarbonate 13.8 (L) 20.0 - 28.0 mmol/L   Acid-base deficit  11.9 (H) 0.0 - 2.0 mmol/L   O2 Saturation 97.0 %   Patient temperature 35.6    Collection site RIGHT RADIAL    Drawn by [id number]     Comment: COLLECTED BY RT   Sample type ARTERIAL DRAW    Allens test (pass/fail) PASS PASS    Comment: Performed at [facility], [place] [address], [place], [place] [address]  I-STAT 7, (LYTES, BLD GAS, ICA, H+H)     Status: Abnormal   Collection Time: 10/18/19 11:22 AM  Result Value Ref Range   pH, Arterial 7.252 (L) 7.350 - 7.450   pCO2 arterial 40.2 32.0 - 48.0 mmHg   pO2, Arterial 424.0 (H) 83.0 - 108.0 mmHg   Bicarbonate 17.8 (L) 20.0 - 28.0 mmol/L   TCO2 19 (L) 22 - 32 mmol/L   O2 Saturation 100.0 %   Acid-base deficit 9.0 (H) 0.0 - 2.0 mmol/L   Sodium 139 135 - 145 mmol/L   Potassium 5.0 3.5 - 5.1 mmol/L   Calcium, Ion 1.12 (L) 1.15 - 1.40 mmol/L   HCT 42.0 39.0 - 52.0 %   Hemoglobin 14.3 13.0 - 17.0 g/dL   Patient temperature 97.7 F    Sample type ARTERIAL     DG Chest 2 View  Result Date:  11/07/2019 CLINICAL DATA:  Acute chest pain and shortness of breath. EXAM: CHEST - 2 VIEW COMPARISON:  02/13/2018 and prior studies FINDINGS: Cardiomegaly, pulmonary vascular congestion and mild interstitial opacities noted. There is no evidence of focal airspace disease, suspicious pulmonary nodule/mass, pleural effusion, or pneumothorax. No acute bony abnormalities are identified. IMPRESSION: 1. Cardiomegaly with pulmonary vascular congestion and probable mild interstitial edema. 2. No evidence of focal pneumonia. Electronically Signed   By: Margarette Canada M.D.   On: 10/23/2019 18:23   DG Chest Port 1 View  Result Date: 10/18/2019 CLINICAL DATA:  Intubation. Respiratory failure. COVID-19 positive. EXAM: PORTABLE CHEST 1 VIEW COMPARISON:  Earlier film, same date. FINDINGS: The endotracheal tube is 7 cm above the carina. There is a NG tube coursing down the esophagus and into the stomach. Stable cardiac enlargement. Persistent patchy bilateral lung infiltrates but overall improved aeration after intubation. IMPRESSION: Endotracheal tube and NG tubes in good position without complicating features. Slight improved lung aeration following intubation. Persistent patchy bilateral lung infiltrates. Electronically Signed   By: Marijo Sanes M.D.   On: 10/18/2019 12:49   DG Chest Port 1 View  Result Date: 10/18/2019 CLINICAL DATA:  Increased shortness of breath.  COVID-19 EXAM: PORTABLE CHEST 1 VIEW COMPARISON:  Radiograph 11/05/2019 FINDINGS: Unchanged cardiomegaly. Unchanged mediastinal contours. Development of hazy multifocal opacities throughout both lungs, more prominent on the right. Probable underlying pulmonary edema. No definite pleural effusion. No pneumothorax. IMPRESSION: 1. Development of hazy multifocal opacities throughout both lungs, more prominent on the right, suspicious for multifocal pneumonia. 2. Unchanged cardiomegaly. Probable pulmonary edema. Electronically Signed   By: Keith Rake M.D.    On: 10/18/2019 01:06   DG Abd Portable 1V  Result Date: 10/18/2019 CLINICAL DATA:  NG tube placement. EXAM: PORTABLE ABDOMEN - 1 VIEW COMPARISON:  02/10/2018 FINDINGS: The NG tube is in the fundal region the stomach. Unremarkable bowel gas pattern. Scattered air in the small bowel and colon. IMPRESSION: NG tube is in the fundal region of the stomach. Electronically Signed   By: Marijo Sanes M.D.   On: 10/18/2019 12:51    Assessment/Plan  1.  AKI on CKD: oliguric kidney injury in the setting of  severe COVID infection.  Cr 2.0--> 4.17 with no response to Lasix challenge today, only put out about 200 overnight with 80 IV Lasix at midnight.  K 5.7 on AM labs, acidotic, repeat 5.0 on iStat.  Will need CRRT--> d/w PCCM, appreciate line assistance.  Orders placed.   2.  Acute hypoxic RF: 2/2 COVID pneumonia, intubated, getting remdesivir, on hep gtt, per primary  3.  Afib/flutter- on amio and hep gtts  4. Severe biventricular failure: with elevated troponins, repeat TTE pending, no response to Lasix as above  5.  Metabolic acidosis- CRRT will correct this  6.  Dispo: critically ill in ICU   Paul [name] 10/18/2019, 3:01 PM

## 2019-10-18 NOTE — Progress Notes (Signed)
Patient transported to 58M bed 3. Patient's cell phone, charger and keys transported with patient.

## 2019-10-18 NOTE — Consult Note (Addendum)
PULMONARY / CRITICAL CARE MEDICINE   NAME:  Paul [name], MRN:  [id number], DOB:  1961/05/16, LOS: 2 ADMISSION DATE:  10/09/2019, CONSULTATION DATE: 10/18/2019 REFERRING MD: Teaching service, CHIEF COMPLAINT: Hypoxic respiratory failure  BRIEF HISTORY:    58 year old male smoker, biventricular failure, high output of the cardiomyopathy who presents with COVID-19 positive and worsening hypoxia. 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  He was found to be atrial fibrillation rapid trickle response.  He was admitted to the hospital he was also have incidental finding of COVID-19 positive.  He continued to defervesce and was transferred to the intensive care unit on 10/18/2019 and urgently intubated.  He has known biventricular failure has been treated with Lasix.  He is on amiodarone drip and heparin drip.  He had elevated troponins cardiology is following.  He also has worsening renal disease will need a renal consult and possible CVVH SIGNIFICANT PAST MEDICAL HISTORY   Biventricular failure Covid Tobacco abuse Atrial fibrillation Chronic kidney disease Degenerative joint disease with hip replacement Chronic pain  SIGNIFICANT EVENTS:  10/10/2019 transfer to acute care ward intubated STUDIES:    CULTURES:  10/18/2019 sputum>> 10/18/2019 blood culture>>  ANTIBIOTICS:  07/2019 remdesivir  LINES/TUBES:   10/18/2019 endotracheal tube CONSULTANTS:  10/18/2019 pulmonary SUBJECTIVE:  58 year old male biventricular failure Covid positive required intubation mechanical ventilatory support.  CONSTITUTIONAL: BP 92/66   Pulse 72   Temp 97.7 F (36.5 C) (Oral)   Resp 18   Ht $R'5\' 11"'Tm$  (1.803 m)   Wt 85.8 kg   SpO2 100%   BMI 26.38 kg/m   I/O last 3 completed shifts: In: 482.2 [P.O.:120; I.V.:312.2; IV  Piggyback:50] Out: 1080 [Urine:1080]     Vent Mode: PRVC FiO2 (%):  [100 %] 100 % Set Rate:  [18 bmp] 18 bmp Vt Set:  [600 mL] 600 mL PEEP:  [5 cmH20] 5 cmH20 Plateau Pressure:  [18 cmH20] 18 cmH20  PHYSICAL EXAM: General: Ill-appearing male in no obvious respiratory distress Neuro: Follows commands prior to intubation HEENT: Endotracheal tube is in place Cardiovascular: Heart sounds are irregular currently on amiodarone drip Lungs: Diminished throughout Abdomen: Positive bowel sounds Musculoskeletal: 2+ edema Skin: Warm  RESOLVED PROBLEM LIST   ASSESSMENT AND PLAN   Vent dependent respiratory failure in the setting of COVID-19 positive suspected Covid pneumonia coupled with congestive heart failure and atrial fibrillation rapid trickle response.  Noted to be current smoker. Admit to the intensive care unit Urgent intubation 10/18/2019 Vent bundle Chest x-ray Arterial blood gases Bronchodilators as needed  Biventricular failure high output cardiomyopathy, atrial fibrillation with rapid ventricular response.  Elevated troponin Being followed by cardiology Currently on amiodarone drip Currently on a heparin drip Per cardiology Further work-up per cardiology  Chronic anemia Recent Labs    10/17/19 0528 10/18/19 0735  HGB 10.9* 13.5   Monitor transfuse per protocol  Worsening renal failure with mixed acidosis  Lab Results  Component Value Date   CREATININE 4.17 (H) 10/18/2019   CREATININE 2.65 (H) 10/17/2019   CREATININE 2.84 (H) 11/05/2019   CREATININE 2.00 (H) 09/28/2016   CREATININE 1.92 (H) 06/16/2015   CREATININE 1.93 (H) 07/23/2014   Suspect he will need CVVH Renal consult Place HD catheter in the near future      SUMMARY OF TODAY'S PLAN:  10/18/2019 transferred to intensive care unit for increasing dyspnea on  exertion hypoxia and acute respiratory distress.  Required intubation and mechanical ventilatory support.  Also noted to be in atrial fib  controlled ventricular rate of 104 on amiodarone drip and heparin drip.  Pulmonary critical care will assume his care at this time.  Best Practice / Goals of Care / Disposition.   DVT PROPHYLAXIS: Currently on IV heparin SUP: PPI NUTRITION: N.p.o. MOBILITY: Bedrest GOALS OF CARE: Full code FAMILY DISCUSSIONS: 10/18/2019 no family at bedside DISPOSITION transfer to intensive care unit in acute respiratory distress  LABS  Glucose No results for input(s): GLUCAP in the last 168 hours.  BMET Recent Labs  Lab 10/31/2019 1749 10/17/19 0528 10/18/19 0736  NA 142 143 140  K 4.7 3.9 5.7*  CL 111 111 109  CO2 22 19* 14*  BUN 46* 45* 60*  CREATININE 2.84* 2.65* 4.17*  GLUCOSE 156* 137* 105*    Liver Enzymes Recent Labs  Lab 10/17/19 0528 10/18/19 0736  AST 32 76*  ALT 28 56*  ALKPHOS 65 88  BILITOT 2.3* 2.7*  ALBUMIN 2.7* 3.2*    Electrolytes Recent Labs  Lab 11/01/2019 1749 10/17/19 0528 10/18/19 0736  CALCIUM 8.5* 8.1* 8.7*  MG  --  1.6* 2.2    CBC Recent Labs  Lab 10/12/2019 1749 10/17/19 0528 10/18/19 0735  WBC 4.5 4.2 6.6  HGB 12.7* 10.9* 13.5  HCT 41.4 35.5* 44.3  PLT 189 146* 199    ABG Recent Labs  Lab 10/18/19 0830  PHART 7.274*  PCO2ART 30.1*  PO2ART 107    Coag's No results for input(s): APTT, INR in the last 168 hours.  Sepsis Markers No results for input(s): LATICACIDVEN, PROCALCITON, O2SATVEN in the last 168 hours.  Cardiac Enzymes No results for input(s): TROPONINI, PROBNP in the last 168 hours.  PAST MEDICAL HISTORY :   He  has a past medical history of Acute diastolic CHF (congestive heart failure), NYHA class 4 ([place]) (03/18/2015), Arthritis, Atrial flutter with rapid ventricular response ([place]) (03/13/2018), BACK PAIN (10/11/2007), Bilateral hip pain (09/05/2012), Biventricular heart failure ([place]) (10/17/2019), CAD (coronary artery disease), CHF exacerbation ([place]) (02/09/2018), Chronic combined systolic and diastolic CHF (congestive heart  failure) ([place]) (03/27/2015), Chronic pain syndrome (05/31/2016), Chronic renal insufficiency, stage III (moderate) (04/10/2015), CKD (chronic kidney disease), stage III, GERD (gastroesophageal reflux disease), Hemorrhoids (10/10/2019), History of cocaine use (02/18/2018), HOCM (hypertrophic obstructive cardiomyopathy) ([place]), Hyperbilirubinemia (10/17/2019), Hypertension, Hypertensive cardiomyopathy ([place]) (09/24/2012), Hypertrophic obstructive cardiomyopathy ([place]) (08/07/2008), Hypoproteinemia ([place]) (10/17/2019), Microcytic anemia (03/19/2015), NSTEMI (non-ST elevated myocardial infarction) ([place]) (03/18/2015), Pacemaker infection ([place]) (03/13/2018), PAF (paroxysmal atrial fibrillation) ([place]), 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  PAST SURGICAL HISTORY:  He  has a past surgical history that includes Hip surgery (Left, 72); Total hip arthroplasty (Right, 03,01); Appendectomy; laparoscopy (09/22/2012); laparoscopic appendectomy (09/22/2012); Total hip arthroplasty with hardware removal (Left, 08/05/2014); Cardiac catheterization (N/A, 03/19/2015); Cardiac catheterization (03/19/2015); Cardiac catheterization (N/A, 04/09/2015); TEE without cardioversion (N/A, 02/15/2018); Cardioversion (N/A, 02/15/2018); A-FLUTTER ABLATION (N/A, 03/13/2018); and TEE without cardioversion (N/A, 03/13/2018).  Allergies  Allergen Reactions  . 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 Ibuprofen Other (See Comments)    Kidney damage    No current facility-administered medications on file prior to encounter.   Current Outpatient Medications on File Prior to Encounter  Medication Sig  .  acetaminophen (TYLENOL) 325 MG  tablet Take 325 mg by mouth every 6 (six) hours as needed.  Marland Kitchen aspirin 81 MG tablet Take 1 tablet (81 mg total) by mouth daily.  . carvedilol (COREG) 3.125 MG tablet Take 1 tablet (3.125 mg total) by mouth 2 (two) times daily.  . fluticasone (FLONASE) 50 MCG/ACT nasal spray Place 2 sprays into both nostrils daily.  . furosemide (LASIX) 40 MG tablet Take one tablet daily for 3 days.  Then take one tablet by mouth as needed for swelling or shortness of breath at night.  . loratadine (CLARITIN) 10 MG tablet Take 1 tablet (10 mg total) by mouth daily.  . sildenafil (VIAGRA) 100 MG tablet TAKE 1 TABLET BY MOUTH DAILY FOR ERECTILE DYSFUNCTION (30 MINUTES TO 4 HOURS BEFORE SEX). (Patient taking differently: Take 100 mg by mouth daily as needed for erectile dysfunction. )  . traMADol (ULTRAM) 50 MG tablet TAKE 1 TABLET BY MOUTH EVERY 12 HOURS AS NEEDED FOR MODERATE PAIN (Patient taking differently: Take 50 mg by mouth every 12 (twelve) hours as needed for moderate pain. If adult CrCl > 30, max dose = 400 mg/day; if CrCl < 30, max dose = 200 mg/day and q12h interval.)    FAMILY HISTORY:   His family history includes Heart disease in his father and mother.  SOCIAL HISTORY:  He  reports that he has been smoking cigarettes. He has a 2.50 pack-year smoking history. He has never used smokeless tobacco. He reports current alcohol use of about 2.0 standard drinks of alcohol per week. He reports that he does not use drugs.  REVIEW OF SYSTEMS:    Ferol Luz Cira Deyoe ACNP Acute Care Nurse Practitioner [place] Please consult Amion 10/18/2019, 11:29 AM

## 2019-10-18 NOTE — Progress Notes (Signed)
Advanced Heart Failure Rounding Note   Subjective:    Patient moved to ICU and intubated for worsening respiratory distress. CXR with diffuse infiltrates. Now on CVVHD and high-dose neo.  RN has drawn co-ox at our request. Co-ox 41% CVP 24  Echo LV 20% RV severely HK Personally reviewed   Objective:   Weight Range:  Vital Signs:   Temp:  [97.7 F (36.5 C)-101 F (38.3 C)] 97.7 F (36.5 C) (12/10 1010) Pulse Rate:  [47-296] 72 (12/10 1900) Resp:  [16-50] 25 (12/10 1900) BP: (76-173)/(54-128) 104/80 (12/10 1900) SpO2:  [67 %-100 %] 83 % (12/10 1815) FiO2 (%):  [40 %-100 %] 60 % (12/10 1900) Weight:  [85.8 kg] 85.8 kg (12/10 0316) Last BM Date: 10/26/2019  Weight change: Filed Weights   10/12/2019 2200 10/17/19 1112 10/18/19 0316  Weight: 87.5 kg 82.9 kg 85.8 kg    Intake/Output:   Intake/Output Summary (Last 24 hours) at 10/18/2019 2043 Last data filed at 10/18/2019 1900 Gross per 24 hour  Intake 1460.7 ml  Output 425 ml  Net 1035.7 ml     Physical Exam: Patient viewed from doorway of room to limit covid exposure General:  Sedated on vent. Not responsive. Critically ill appearing  HEENT: normal + ETT Ab appears distended Ext no obvious edema   Telemetry:  AFib 70-80s Personally reviewed   Labs: Basic Metabolic Panel: Recent Labs  Lab 10/26/2019 1749 10/17/19 0528 10/18/19 0736 10/18/19 1122 10/18/19 1551  NA 142 143 140 139 142  K 4.7 3.9 5.7* 5.0 6.1*  CL 111 111 109  --  108  CO2 22 19* 14*  --  15*  GLUCOSE 156* 137* 105*  --  114*  BUN 46* 45* 60*  --  69*  CREATININE 2.84* 2.65* 4.17*  --  4.43*  CALCIUM 8.5* 8.1* 8.7*  --  8.6*  MG  --  1.6* 2.2  --   --   PHOS  --   --   --   --  6.8*    Liver Function Tests: Recent Labs  Lab 10/17/19 0528 10/18/19 0736 10/18/19 1551  AST 32 76*  --   ALT 28 56*  --   ALKPHOS 65 88  --   BILITOT 2.3* 2.7*  --   PROT 5.5* 6.1*  --   ALBUMIN 2.7* 3.2* 3.0*   No results for input(s): LIPASE,  AMYLASE in the last 168 hours. No results for input(s): AMMONIA in the last 168 hours.  CBC: Recent Labs  Lab 10/15/2019 1749 10/17/19 0528 10/18/19 0735 10/18/19 1122  WBC 4.5 4.2 6.6  --   HGB 12.7* 10.9* 13.5 14.3  HCT 41.4 35.5* 44.3 42.0  MCV 80.7 80.1 80.0  --   PLT 189 146* 199  --     Cardiac Enzymes: No results for input(s): CKTOTAL, CKMB, CKMBINDEX, TROPONINI in the last 168 hours.  BNP: BNP (last 3 results) Recent Labs    10/17/19 0528  BNP 2,601.7*    ProBNP (last 3 results) Recent Labs    07/30/19 1620  PROBNP 9,273*      Other results:  Imaging: DG CHEST PORT 1 VIEW  Result Date: 10/18/2019 CLINICAL DATA:  Central line placement EXAM: PORTABLE CHEST 1 VIEW COMPARISON:  10/18/2019 FINDINGS: Heart size is significantly enlarged. The enteric tube projects over the gastric body. There is a new right-sided central venous catheter with tip terminating over the SVC. There is no right-sided pneumothorax. Diffuse bilateral hazy airspace opacities  are again noted. There are likely small bilateral pleural effusions. There is no acute osseous abnormality. The endotracheal tube terminates above the carina. IMPRESSION: 1. Well-positioned right-sided central venous catheter without evidence for pneumothorax. 2. Remaining lines and tubes as above. 3. Otherwise, no significant interval change. Electronically Signed   By: Constance Holster M.D.   On: 10/18/2019 19:14   DG Chest Port 1 View  Result Date: 10/18/2019 CLINICAL DATA:  Intubation. Respiratory failure. COVID-19 positive. EXAM: PORTABLE CHEST 1 VIEW COMPARISON:  Earlier film, same date. FINDINGS: The endotracheal tube is 7 cm above the carina. There is a NG tube coursing down the esophagus and into the stomach. Stable cardiac enlargement. Persistent patchy bilateral lung infiltrates but overall improved aeration after intubation. IMPRESSION: Endotracheal tube and NG tubes in good position without complicating  features. Slight improved lung aeration following intubation. Persistent patchy bilateral lung infiltrates. Electronically Signed   By: Marijo Sanes M.D.   On: 10/18/2019 12:49   DG Chest Port 1 View  Result Date: 10/18/2019 CLINICAL DATA:  Increased shortness of breath.  COVID-19 EXAM: PORTABLE CHEST 1 VIEW COMPARISON:  Radiograph 11/05/2019 FINDINGS: Unchanged cardiomegaly. Unchanged mediastinal contours. Development of hazy multifocal opacities throughout both lungs, more prominent on the right. Probable underlying pulmonary edema. No definite pleural effusion. No pneumothorax. IMPRESSION: 1. Development of hazy multifocal opacities throughout both lungs, more prominent on the right, suspicious for multifocal pneumonia. 2. Unchanged cardiomegaly. Probable pulmonary edema. Electronically Signed   By: Keith Rake M.D.   On: 10/18/2019 01:06   DG Abd Portable 1V  Result Date: 10/18/2019 CLINICAL DATA:  NG tube placement. EXAM: PORTABLE ABDOMEN - 1 VIEW COMPARISON:  02/10/2018 FINDINGS: The NG tube is in the fundal region the stomach. Unremarkable bowel gas pattern. Scattered air in the small bowel and colon. IMPRESSION: NG tube is in the fundal region of the stomach. Electronically Signed   By: Marijo Sanes M.D.   On: 10/18/2019 12:51   ECHOCARDIOGRAM COMPLETE  Result Date: 10/18/2019   ECHOCARDIOGRAM REPORT   Patient Name:   Paul [name] Date of Exam: 10/18/2019 Medical Rec #:  [id number]         Height:       71.0 in Accession #:    [id number]        Weight:       189.2 lb Date of Birth:  Aug 13, 1961         BSA:          2.06 m Patient Age:    58 years          BP:           88/72 mmHg Patient Gender: M                 HR:           87 bpm. Exam Location:  Inpatient Procedure: 2D Echo Indications:    congestive heart failure 428.0  History:        Patient has prior history of Echocardiogram examinations, most                 recent 08/14/2019. Chronic kidney disease. Covid.,                  Arrythmias:Atrial Fibrillation; Risk Factors:Current Smoker.  Sonographer:    Johny Chess Referring Phys: [id number] TODD D MCDIARMID  Sonographer Comments: Echo performed with patient supine and on artificial respirator. IMPRESSIONS  1. Left ventricular ejection fraction, by  visual estimation, is 20 to 25%. The left ventricle has severely decreased function. There is mildly increased left ventricular hypertrophy.  2. Left ventricular diastolic function could not be evaluated.  3. Moderately dilated left ventricular internal cavity size.  4. The left ventricle demonstrates global hypokinesis.  5. Global right ventricle has severely reduced systolic function.The right ventricular size is normal.  6. Left atrial size was severely dilated.  7. Right atrial size was mildly dilated.  8. Moderate pleural effusion.  9. Trivial pericardial effusion is present. 10. Moderate thickening of the mitral valve leaflet(s). 11. The mitral valve is normal in structure. Mild mitral valve regurgitation. No evidence of mitral stenosis. 12. The tricuspid valve is normal in structure. Tricuspid valve regurgitation is mild. 13. The aortic valve is normal in structure. Aortic valve regurgitation is not visualized. No evidence of aortic valve sclerosis or stenosis. 14. The pulmonic valve was normal in structure. Pulmonic valve regurgitation is not visualized. 15. Moderately elevated pulmonary artery systolic pressure. 16. The inferior vena cava is dilated in size with <50% respiratory variability, suggesting right atrial pressure of 15 mmHg. 17. Severe global reduction in LV function; moderate LVE; mild LVH; biatrial enlargement; severe RV dysfunction; oscillating density in right atrium likely chiari network; mild MR; mild TR; moderate pulmonary hypertension. FINDINGS  Left Ventricle: Left ventricular ejection fraction, by visual estimation, is 20 to 25%. The left ventricle has severely decreased function. The left ventricle demonstrates  global hypokinesis. The left ventricular internal cavity size was moderately dilated left ventricle. There is mildly increased left ventricular hypertrophy. The left ventricular diastology could not be evaluated due to atrial fibrillation. Left ventricular diastolic function could not be evaluated. Normal left atrial pressure. Right Ventricle: The right ventricular size is normal.Global RV systolic function is has severely reduced systolic function. The tricuspid regurgitant velocity is 3.38 m/s, and with an assumed right atrial pressure of 15 mmHg, the estimated right ventricular systolic pressure is moderately elevated at 60.8 mmHg. Left Atrium: Left atrial size was severely dilated. Right Atrium: Right atrial size was mildly dilated Pericardium: Trivial pericardial effusion is present. There is a moderate pleural effusion in either the left or right lateral region. Mitral Valve: The mitral valve is normal in structure. There is moderate thickening of the mitral valve leaflet(s). Mild mitral valve regurgitation. No evidence of mitral valve stenosis by observation. Tricuspid Valve: The tricuspid valve is normal in structure. Tricuspid valve regurgitation is mild. Aortic Valve: The aortic valve is normal in structure. Aortic valve regurgitation is not visualized. The aortic valve is structurally normal, with no evidence of sclerosis or stenosis. Pulmonic Valve: The pulmonic valve was normal in structure. Pulmonic valve regurgitation is not visualized. Pulmonic regurgitation is not visualized. Aorta: The aortic root is normal in size and structure. Venous: The inferior vena cava is dilated in size with less than 50% respiratory variability, suggesting right atrial pressure of 15 mmHg.  Additional Comments: Severe global reduction in LV function; moderate LVE; mild LVH; biatrial enlargement; severe RV dysfunction; oscillating density in right atrium likely chiari network; mild MR; mild TR; moderate pulmonary  hypertension.  LEFT VENTRICLE PLAX 2D LVIDd:         6.10 cm LVIDs:         5.30 cm LV PW:         1.30 cm LV IVS:        1.30 cm LVOT diam:     2.00 cm LV SV:  52 ml LV SV Index:   24.74 LVOT Area:     3.14 cm  LEFT ATRIUM              Index       RIGHT ATRIUM           Index LA diam:        5.00 cm  2.43 cm/m  RA Area:     20.60 cm LA Vol (A2C):   149.0 ml 72.36 ml/m RA Volume:   54.80 ml  26.61 ml/m LA Vol (A4C):   104.0 ml 50.51 ml/m LA Biplane Vol: 123.0 ml 59.73 ml/m   AORTA Ao Root diam: 3.30 cm TRICUSPID VALVE TR Peak grad:   45.8 mmHg TR Vmax:        339.00 cm/s  SHUNTS Systemic Diam: 2.00 cm  Kirk Ruths MD Electronically signed by Kirk Ruths MD Signature Date/Time: 10/18/2019/3:51:08 PM    Final       Medications:     Scheduled Medications: . aspirin EC  81 mg Oral Daily  . Chlorhexidine Gluconate Cloth  6 each Topical Daily  . dexamethasone  6 mg Oral Daily  . fluticasone  2 spray Each Nare Daily  . hydrocortisone-pramoxine  1 applicator Rectal QID  . loratadine  10 mg Oral Daily  . ramelteon  8 mg Oral QHS  . sodium chloride flush  3 mL Intravenous Once     Infusions: .  prismasol BGK 4/2.5    .  prismasol BGK 4/2.5    . amiodarone 30 mg/hr (10/18/19 1900)  . dexmedetomidine (PRECEDEX) IV infusion 1.2 mcg/kg/hr (10/18/19 1900)  . fentaNYL infusion INTRAVENOUS    . heparin Stopped (10/18/19 1542)  . norepinephrine (LEVOPHED) Adult infusion    . phenylephrine    . prismasol BGK 4/2.5    . remdesivir 100 mg in NS 100 mL       PRN Medications:  acetaminophen, bisacodyl, fentaNYL (SUBLIMAZE) injection, fentaNYL (SUBLIMAZE) injection, heparin, heparin, ondansetron (ZOFRAN) IV   Assessment/Plan:   1. Acute hypoxic respiratory failure - rapidly progressive. Suspect combination of HF and COVID PNA - now on vent  2. 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 LVEF 20 RV severely HK Personally reviewed - Co-ox 41% c/w shock  with severe volume overload - would switch neo to norepi. (doubt it will drive AF rate too much) - volume status managed with CVVHD  3. 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  - Continue amio. If he survives and doesn't convert on amio, will need TEE/DC-CV  4. AKI on CKD IV - baseline creatinine 2.5-2.8  - now nearly anuric - on CVVHD  5. COVID + - treatment per CCM  6. CAD  - s/p post MI treated with staged DES to the LAD and circumflex 04/2015:  - Last cath 2016    Prox RCA lesion, 20% stenosed.  RPDA lesion, 60% stenosed.  1st Diag lesion, 60% stenosed.  Mid Cx lesion, 90% stenosed. There is a 0% residual stenosis post intervention. A drug-eluting stent was placed.  He is critically ill with cardiogenic shock, acute COVID infection and MSOF. I think his chances of survival are low but would continue aggressive care for now. Would switch neo to norepi for better cardiac support. Fluid removal as tolerated with CVVHD.   CRITICAL CARE Performed by: Glori Bickers  Total critical care time: 35 minutes  Critical care time  was exclusive of separately billable procedures and treating other patients.  Critical care was necessary to treat or prevent imminent or life-threatening deterioration.  Critical care was time spent personally by me (independent of midlevel providers or residents) on the following activities: development of treatment plan with patient and/or surrogate as well as nursing, discussions with consultants, evaluation of patient's response to treatment, examination of patient, obtaining history from patient or surrogate, ordering and performing treatments and interventions, ordering and review of laboratory studies, ordering and review of radiographic studies, pulse oximetry and re-evaluation of patient's condition.     Length of Stay: 2   Glori Bickers 10/18/2019, 8:43 PM  Advanced Heart Failure  Team Pager [phone] (M-F; 7a - 4p)  Please contact [facility] for night-coverage after hours (4p -7a ) and weekends on [email or web address]

## 2019-10-19 ENCOUNTER — Inpatient Hospital Stay (HOSPITAL_COMMUNITY): Payer: Medicare HMO

## 2019-10-19 LAB — COMPREHENSIVE METABOLIC PANEL
ALT: 433 U/L — ABNORMAL HIGH (ref 0–44)
AST: 611 U/L — ABNORMAL HIGH (ref 15–41)
Albumin: 2.7 g/dL — ABNORMAL LOW (ref 3.5–5.0)
Alkaline Phosphatase: 85 U/L (ref 38–126)
Anion gap: 19 — ABNORMAL HIGH (ref 5–15)
BUN: 57 mg/dL — ABNORMAL HIGH (ref 6–20)
CO2: 12 mmol/L — ABNORMAL LOW (ref 22–32)
Calcium: 7.9 mg/dL — ABNORMAL LOW (ref 8.9–10.3)
Chloride: 111 mmol/L (ref 98–111)
Creatinine, Ser: 3.87 mg/dL — ABNORMAL HIGH (ref 0.61–1.24)
GFR calc Af Amer: 18 mL/min — ABNORMAL LOW (ref >60)
GFR calc non Af Amer: 16 mL/min — ABNORMAL LOW (ref >60)
Glucose, Bld: 78 mg/dL (ref 70–99)
Potassium: 5.3 mmol/L — ABNORMAL HIGH (ref 3.5–5.1)
Sodium: 142 mmol/L (ref 135–145)
Total Bilirubin: 3.5 mg/dL — ABNORMAL HIGH (ref 0.3–1.2)
Total Protein: 5.1 g/dL — ABNORMAL LOW (ref 6.5–8.1)

## 2019-10-19 LAB — CBC WITH DIFFERENTIAL/PLATELET
Abs Immature Granulocytes: 0.11 10*3/uL — ABNORMAL HIGH (ref 0.00–0.07)
Basophils Absolute: 0 10*3/uL (ref 0.0–0.1)
Basophils Relative: 0 %
Eosinophils Absolute: 0 10*3/uL (ref 0.0–0.5)
Eosinophils Relative: 0 %
HCT: 43.1 % (ref 39.0–52.0)
Hemoglobin: 13.3 g/dL (ref 13.0–17.0)
Immature Granulocytes: 1 %
Lymphocytes Relative: 7 %
Lymphs Abs: 1 10*3/uL (ref 0.7–4.0)
MCH: 24.9 pg — ABNORMAL LOW (ref 26.0–34.0)
MCHC: 30.9 g/dL (ref 30.0–36.0)
MCV: 80.7 fL (ref 80.0–100.0)
Monocytes Absolute: 0.6 10*3/uL (ref 0.1–1.0)
Monocytes Relative: 4 %
Neutro Abs: 13.1 10*3/uL — ABNORMAL HIGH (ref 1.7–7.7)
Neutrophils Relative %: 88 %
Platelets: 163 10*3/uL (ref 150–400)
RBC: 5.34 MIL/uL (ref 4.22–5.81)
RDW: 15.2 % (ref 11.5–15.5)
WBC: 14.9 10*3/uL — ABNORMAL HIGH (ref 4.0–10.5)
nRBC: 0.9 % — ABNORMAL HIGH (ref 0.0–0.2)

## 2019-10-19 LAB — PHOSPHORUS
Phosphorus: 7.1 mg/dL — ABNORMAL HIGH (ref 2.5–4.6)
Phosphorus: 5 mg/dL — ABNORMAL HIGH (ref 2.5–4.6)

## 2019-10-19 LAB — RENAL FUNCTION PANEL
Albumin: 2.6 g/dL — ABNORMAL LOW (ref 3.5–5.0)
Anion gap: 15 (ref 5–15)
BUN: 43 mg/dL — ABNORMAL HIGH (ref 6–20)
CO2: 16 mmol/L — ABNORMAL LOW (ref 22–32)
Calcium: 7.6 mg/dL — ABNORMAL LOW (ref 8.9–10.3)
Chloride: 105 mmol/L (ref 98–111)
Creatinine, Ser: 2.88 mg/dL — ABNORMAL HIGH (ref 0.61–1.24)
GFR calc Af Amer: 26 mL/min — ABNORMAL LOW (ref >60)
GFR calc non Af Amer: 23 mL/min — ABNORMAL LOW (ref >60)
Glucose, Bld: 151 mg/dL — ABNORMAL HIGH (ref 70–99)
Phosphorus: 5.1 mg/dL — ABNORMAL HIGH (ref 2.5–4.6)
Potassium: 4.3 mmol/L (ref 3.5–5.1)
Sodium: 136 mmol/L (ref 135–145)

## 2019-10-19 LAB — HEMOGLOBIN A1C
Hgb A1c MFr Bld: 6 % — ABNORMAL HIGH (ref 4.8–5.6)
Mean Plasma Glucose: 125.5 mg/dL

## 2019-10-19 LAB — GLUCOSE, CAPILLARY
Glucose-Capillary: 118 mg/dL — ABNORMAL HIGH (ref 70–99)
Glucose-Capillary: 101 mg/dL — ABNORMAL HIGH (ref 70–99)
Glucose-Capillary: 115 mg/dL — ABNORMAL HIGH (ref 70–99)
Glucose-Capillary: 129 mg/dL — ABNORMAL HIGH (ref 70–99)

## 2019-10-19 LAB — APTT
aPTT: 189 seconds (ref 24–36)
aPTT: >200 seconds (ref 24–36)

## 2019-10-19 LAB — MAGNESIUM
Magnesium: 2.2 mg/dL (ref 1.7–2.4)
Magnesium: 2.3 mg/dL (ref 1.7–2.4)

## 2019-10-19 LAB — HEPARIN LEVEL (UNFRACTIONATED): Heparin Unfractionated: 0.45 IU/mL (ref 0.30–0.70)

## 2019-10-19 MED ORDER — FAMOTIDINE IN NACL 20-0.9 MG/50ML-% IV SOLN
20.0000 mg | INTRAVENOUS | Status: DC
  Administered 2019-10-19: 20 mg via INTRAVENOUS
  Filled 2019-10-19: qty 50

## 2019-10-19 MED ORDER — CHLORHEXIDINE GLUCONATE 0.12% ORAL RINSE (MEDLINE KIT)
15.0000 mL | Freq: Two times a day (BID) | OROMUCOSAL | Status: DC
  Administered 2019-10-19 – 2019-10-22 (×8): 15 mL via OROMUCOSAL

## 2019-10-19 MED ORDER — B COMPLEX-C PO TABS
1.0000 | ORAL_TABLET | Freq: Every day | ORAL | Status: DC
  Administered 2019-10-19 – 2019-10-22 (×4): 1
  Filled 2019-10-19 (×5): qty 1

## 2019-10-19 MED ORDER — VITAL HIGH PROTEIN PO LIQD
1000.0000 mL | ORAL | Status: DC
  Administered 2019-10-19: 1000 mL

## 2019-10-19 MED ORDER — VITAL AF 1.2 CAL PO LIQD
1000.0000 mL | ORAL | Status: DC
  Administered 2019-10-20 – 2019-10-22 (×3): 1000 mL

## 2019-10-19 MED ORDER — PRISMASOL BGK 4/2.5 32-4-2.5 MEQ/L REPLACEMENT SOLN
Status: DC
  Administered 2019-10-19 – 2019-10-22 (×6): via INTRAVENOUS_CENTRAL
  Filled 2019-10-19 (×4): qty 5000

## 2019-10-19 MED ORDER — PRO-STAT SUGAR FREE PO LIQD
30.0000 mL | Freq: Two times a day (BID) | ORAL | Status: DC
  Administered 2019-10-19: 30 mL
  Filled 2019-10-19: qty 30

## 2019-10-19 MED ORDER — DEXAMETHASONE 6 MG PO TABS
6.0000 mg | ORAL_TABLET | Freq: Every day | ORAL | Status: DC
  Administered 2019-10-19 – 2019-10-22 (×4): 6 mg
  Filled 2019-10-19 (×5): qty 1

## 2019-10-19 MED ORDER — PRO-STAT SUGAR FREE PO LIQD
60.0000 mL | Freq: Three times a day (TID) | ORAL | Status: DC
  Administered 2019-10-19 – 2019-10-22 (×8): 60 mL
  Filled 2019-10-19 (×9): qty 60

## 2019-10-19 MED ORDER — ORAL CARE MOUTH RINSE
15.0000 mL | OROMUCOSAL | Status: DC
  Administered 2019-10-19 – 2019-10-22 (×35): 15 mL via OROMUCOSAL

## 2019-10-19 MED ORDER — AMIODARONE HCL 200 MG PO TABS
200.0000 mg | ORAL_TABLET | Freq: Every day | ORAL | Status: DC
  Administered 2019-10-19 – 2019-10-20 (×2): 200 mg
  Filled 2019-10-19 (×3): qty 1

## 2019-10-19 MED ORDER — PRISMASOL BGK 4/2.5 32-4-2.5 MEQ/L REPLACEMENT SOLN
Status: DC
  Administered 2019-10-19 – 2019-10-22 (×3): via INTRAVENOUS_CENTRAL
  Filled 2019-10-19 (×2): qty 5000

## 2019-10-19 MED ORDER — ASPIRIN 81 MG PO CHEW
81.0000 mg | CHEWABLE_TABLET | Freq: Every day | ORAL | Status: DC
  Administered 2019-10-19 – 2019-10-21 (×3): 81 mg
  Filled 2019-10-19 (×4): qty 1

## 2019-10-19 MED ORDER — LORATADINE 10 MG PO TABS
10.0000 mg | ORAL_TABLET | Freq: Every day | ORAL | Status: DC
  Administered 2019-10-19 – 2019-10-22 (×4): 10 mg
  Filled 2019-10-19 (×5): qty 1

## 2019-10-19 MED ORDER — INSULIN ASPART 100 UNIT/ML ~~LOC~~ SOLN
0.0000 [IU] | SUBCUTANEOUS | Status: DC
  Administered 2019-10-19 – 2019-10-20 (×3): 2 [IU] via SUBCUTANEOUS
  Administered 2019-10-20: 3 [IU] via SUBCUTANEOUS
  Administered 2019-10-21: 2 [IU] via SUBCUTANEOUS

## 2019-10-19 MED ORDER — HEPARIN (PORCINE) IN NACL 2-0.9 UNITS/ML: INTRAMUSCULAR | Status: DC | PRN

## 2019-10-19 NOTE — Progress Notes (Signed)
[place] for Heparin  Indication: atrial fibrillation  Allergies  Allergen Reactions  . 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 Ibuprofen Other (See Comments)    Kidney damage    Patient Measurements: Height: 5\' 11"  (180.3 cm) Weight: 189 lb 13.1 oz (86.1 kg) IBW/kg (Calculated) : 75.3 Heparin Dosing Weight: 87.5 kg  Vital Signs: Temp: 95.9 F (35.5 C) (12/11 0554) Temp Source: Axillary (12/11 0554) BP: 128/79 (12/11 0600) Pulse Rate: 66 (12/11 0300)  Labs: Recent Labs    10/18/2019 1749 10/31/2019 1749 10/17/19 0528 10/17/19 0528 10/17/19 1722 10/18/19 0735 10/18/19 0736 10/18/19 1122 10/18/19 1551 10/18/19 2054 10/18/19 2150 10/19/19 0237 10/19/19 0257  HGB 12.7*  --  10.9*   < >  --  13.5  --  14.3  --  15.6  --  13.3  --   HCT 41.4  --  35.5*   < >  --  44.3  --  42.0  --  46.0  --  43.1  --   PLT 189  --  146*  --   --  199  --   --   --   --   --  163  --   APTT  --   --   --   --   --   --   --   --   --   --  69* 189*  --   HEPARINUNFRC  --    < > 0.87*  --  0.26* 0.64  --   --   --   --   --   --  0.45  CREATININE 2.84*  --  2.65*  --   --   --  4.17*  --  4.43*  --   --  3.87*  --   TROPONINIHS 433*  --  295*  --   --   --   --   --   --   --   --   --   --    < > = values in this interval not displayed.    Estimated Creatinine Clearance: 21.6 mL/min (A) (by C-G formula based on SCr of 3.87 mg/dL (H)).   Assessment: 69 yom that presented to the ED in A-fib w/ RVR. Patient's CHA2DS2-VAsc score is > 2. With this pharmacy has been asked to dose heparin at this time.   Patient was transferred to ICU, Intubated, and placed on CRRT yesterday  Hep lvl have been therapeutic on IV heparin - despite this, issues with clotting of CRRT circuit. We have had these issues w COVID, so fixed rate circuit heparin added  Goal of Therapy:  Heparin level 0.3-0.7 units/ml Monitor platelets by anticoagulation  protocol: Yes   Plan:  Heparin at 1300 units/hr (+ 300 units/hr fixed rate in CRRT) Daily HL CBC  F/u change to [place], PharmD, BCPS, BCCCP Clinical Pharmacist [phone]  Please check AMION for all [facility] numbers  10/19/2019 8:34 AM

## 2019-10-19 NOTE — Progress Notes (Signed)
FPTS Interim Progress Note  Daily social note.  This patient was admitted to the family medicine service prior to requiring ICU admission.  Greatly appreciate the wonderful care that the ICU was providing and would love to have him back on our service once he is stable for transition out of the ICU.  Gifford Shave, MD 10/19/2019, 11:56 AM PGY-1, [facility] pager [phone]

## 2019-10-19 NOTE — Progress Notes (Signed)
CRITICAL VALUE ALERT  Critical Value:  PTT >200  Date & Time Notied:  10/19/19 7:37 PM   Provider Notified: elink rn  Orders Received/Actions taken: was told to tell pharmacy instead

## 2019-10-19 NOTE — Progress Notes (Signed)
Advanced Heart Failure Rounding Note   Subjective:    Remains sedated on vent. On CVVHD pulling only 30-50/hr. On NE 80. SBP 95-110.   CXR with worsening airspace disease and LL collapse.   Remains in AF. IV amio switched to po.    Echo LV 20% RV severely HK Personally reviewed   Objective:   Weight Range:  Vital Signs:   Temp:  [94.3 F (34.6 C)-97.4 F (36.3 C)] 97.4 F (36.3 C) (12/11 1200) Pulse Rate:  [47-135] 79 (12/11 1537) Resp:  [17-34] 24 (12/11 1537) BP: (76-128)/(51-98) 94/71 (12/11 1400) SpO2:  [67 %-100 %] 100 % (12/11 1537) Arterial Line BP: (103-146)/(55-78) 103/55 (12/11 1400) FiO2 (%):  [40 %-60 %] 40 % (12/11 1537) Weight:  [86.1 kg] 86.1 kg (12/11 0500) Last BM Date: 11/06/2019  Weight change: Filed Weights   10/17/19 1112 10/18/19 0316 10/19/19 0500  Weight: 82.9 kg 85.8 kg 86.1 kg    Intake/Output:   Intake/Output Summary (Last 24 hours) at 10/19/2019 1626 Last data filed at 10/19/2019 1500 Gross per 24 hour  Intake 3490.14 ml  Output 2354 ml  Net 1136.14 ml     Physical Exam: Viewed from door due to [place]:  Ill appearing. Sedated on vent HEENT: + ETT Neck: JVP to jaw Cor: Irreg on monitor Abdomen: nondistended.   Extremities: no edema Neuro: sedated on vent   Telemetry:  AFib 70s Personally reviewed   Labs: Basic Metabolic Panel: Recent Labs  Lab 11/05/2019 1749 10/17/19 0528 10/18/19 0736 10/18/19 1122 10/18/19 1551 10/18/19 2054 10/19/19 0237  NA 142 143 140 139 142 139 142  K 4.7 3.9 5.7* 5.0 6.1* 6.8* 5.3*  CL 111 111 109  --  108  --  111  CO2 22 19* 14*  --  15*  --  12*  GLUCOSE 156* 137* 105*  --  114*  --  78  BUN 46* 45* 60*  --  69*  --  57*  CREATININE 2.84* 2.65* 4.17*  --  4.43*  --  3.87*  CALCIUM 8.5* 8.1* 8.7*  --  8.6*  --  7.9*  MG  --  1.6* 2.2  --   --   --  2.2  PHOS  --   --   --   --  6.8*  --  7.1*    Liver Function Tests: Recent Labs  Lab 10/17/19 0528 10/18/19 0736  10/18/19 1551 10/19/19 0237  AST 32 76*  --  611*  ALT 28 56*  --  433*  ALKPHOS 65 88  --  85  BILITOT 2.3* 2.7*  --  3.5*  PROT 5.5* 6.1*  --  5.1*  ALBUMIN 2.7* 3.2* 3.0* 2.7*   No results for input(s): LIPASE, AMYLASE in the last 168 hours. No results for input(s): AMMONIA in the last 168 hours.  CBC: Recent Labs  Lab 11/07/2019 1749 10/17/19 0528 10/18/19 0735 10/18/19 1122 10/18/19 2054 10/19/19 0237  WBC 4.5 4.2 6.6  --   --  14.9*  NEUTROABS  --   --   --   --   --  13.1*  HGB 12.7* 10.9* 13.5 14.3 15.6 13.3  HCT 41.4 35.5* 44.3 42.0 46.0 43.1  MCV 80.7 80.1 80.0  --   --  80.7  PLT 189 146* 199  --   --  163    Cardiac Enzymes: No results for input(s): CKTOTAL, CKMB, CKMBINDEX, TROPONINI in the last 168 hours.  BNP:  BNP (last 3 results) Recent Labs    10/17/19 0528  BNP 2,601.7*    ProBNP (last 3 results) Recent Labs    07/30/19 1620  PROBNP 9,273*      Other results:  Imaging: AM DG Chest Port 1 View  Result Date: 10/19/2019 CLINICAL DATA:  58 year old male with COVID-19. N STEMI. EXAM: PORTABLE CHEST 1 VIEW COMPARISON:  10/18/2019 portable chest and earlier. FINDINGS: Portable AP semi upright view at 0501 hours. Stable endotracheal tube at the level the clavicles. Stable enteric tube coiled in the left upper quadrant. Stable right IJ central line. Stable cardiomegaly with decreasing retrocardiac and left lung ventilation. Stable mediastinal contours. Stable pulmonary vascularity, no overt edema. No pneumothorax. Small if any pleural effusions. IMPRESSION: 1. Stable lines and tubes. 2. Increasing lower lobe collapse or consolidation since yesterday. 3. Stable cardiomegaly and pulmonary vascular congestion without overt edema. Small if any pleural effusions. Electronically Signed   By: Genevie Ann M.D.   On: 10/19/2019 07:55   DG CHEST PORT 1 VIEW  Result Date: 10/18/2019 CLINICAL DATA:  Central line placement EXAM: PORTABLE CHEST 1 VIEW COMPARISON:   10/18/2019 FINDINGS: Heart size is significantly enlarged. The enteric tube projects over the gastric body. There is a new right-sided central venous catheter with tip terminating over the SVC. There is no right-sided pneumothorax. Diffuse bilateral hazy airspace opacities are again noted. There are likely small bilateral pleural effusions. There is no acute osseous abnormality. The endotracheal tube terminates above the carina. IMPRESSION: 1. Well-positioned right-sided central venous catheter without evidence for pneumothorax. 2. Remaining lines and tubes as above. 3. Otherwise, no significant interval change. Electronically Signed   By: Constance Holster M.D.   On: 10/18/2019 19:14   DG Chest Port 1 View  Result Date: 10/18/2019 CLINICAL DATA:  Intubation. Respiratory failure. COVID-19 positive. EXAM: PORTABLE CHEST 1 VIEW COMPARISON:  Earlier film, same date. FINDINGS: The endotracheal tube is 7 cm above the carina. There is a NG tube coursing down the esophagus and into the stomach. Stable cardiac enlargement. Persistent patchy bilateral lung infiltrates but overall improved aeration after intubation. IMPRESSION: Endotracheal tube and NG tubes in good position without complicating features. Slight improved lung aeration following intubation. Persistent patchy bilateral lung infiltrates. Electronically Signed   By: Marijo Sanes M.D.   On: 10/18/2019 12:49   DG Chest Port 1 View  Result Date: 10/18/2019 CLINICAL DATA:  Increased shortness of breath.  COVID-19 EXAM: PORTABLE CHEST 1 VIEW COMPARISON:  Radiograph 10/12/2019 FINDINGS: Unchanged cardiomegaly. Unchanged mediastinal contours. Development of hazy multifocal opacities throughout both lungs, more prominent on the right. Probable underlying pulmonary edema. No definite pleural effusion. No pneumothorax. IMPRESSION: 1. Development of hazy multifocal opacities throughout both lungs, more prominent on the right, suspicious for multifocal pneumonia.  2. Unchanged cardiomegaly. Probable pulmonary edema. Electronically Signed   By: Keith Rake M.D.   On: 10/18/2019 01:06   DG Abd Portable 1V  Result Date: 10/18/2019 CLINICAL DATA:  NG tube placement. EXAM: PORTABLE ABDOMEN - 1 VIEW COMPARISON:  02/10/2018 FINDINGS: The NG tube is in the fundal region the stomach. Unremarkable bowel gas pattern. Scattered air in the small bowel and colon. IMPRESSION: NG tube is in the fundal region of the stomach. Electronically Signed   By: Marijo Sanes M.D.   On: 10/18/2019 12:51   ECHOCARDIOGRAM COMPLETE  Result Date: 10/18/2019   ECHOCARDIOGRAM REPORT   Patient Name:   Paul [name] Date of Exam: 10/18/2019 Medical Rec #:  [id number]         Height:       71.0 in Accession #:    [id number]        Weight:       189.2 lb Date of Birth:  12-01-1960         BSA:          2.06 m Patient Age:    1 years          BP:           88/72 mmHg Patient Gender: M                 HR:           87 bpm. Exam Location:  Inpatient Procedure: 2D Echo Indications:    congestive heart failure 428.0  History:        Patient has prior history of Echocardiogram examinations, most                 recent 08/14/2019. Chronic kidney disease. Covid.,                 Arrythmias:Atrial Fibrillation; Risk Factors:Current Smoker.  Sonographer:    Johny Chess Referring Phys: [id number] TODD D MCDIARMID  Sonographer Comments: Echo performed with patient supine and on artificial respirator. IMPRESSIONS  1. Left ventricular ejection fraction, by visual estimation, is 20 to 25%. The left ventricle has severely decreased function. There is mildly increased left ventricular hypertrophy.  2. Left ventricular diastolic function could not be evaluated.  3. Moderately dilated left ventricular internal cavity size.  4. The left ventricle demonstrates global hypokinesis.  5. Global right ventricle has severely reduced systolic function.The right ventricular size is normal.  6. Left atrial size was severely  dilated.  7. Right atrial size was mildly dilated.  8. Moderate pleural effusion.  9. Trivial pericardial effusion is present. 10. Moderate thickening of the mitral valve leaflet(s). 11. The mitral valve is normal in structure. Mild mitral valve regurgitation. No evidence of mitral stenosis. 12. The tricuspid valve is normal in structure. Tricuspid valve regurgitation is mild. 13. The aortic valve is normal in structure. Aortic valve regurgitation is not visualized. No evidence of aortic valve sclerosis or stenosis. 14. The pulmonic valve was normal in structure. Pulmonic valve regurgitation is not visualized. 15. Moderately elevated pulmonary artery systolic pressure. 16. The inferior vena cava is dilated in size with <50% respiratory variability, suggesting right atrial pressure of 15 mmHg. 17. Severe global reduction in LV function; moderate LVE; mild LVH; biatrial enlargement; severe RV dysfunction; oscillating density in right atrium likely chiari network; mild MR; mild TR; moderate pulmonary hypertension. FINDINGS  Left Ventricle: Left ventricular ejection fraction, by visual estimation, is 20 to 25%. The left ventricle has severely decreased function. The left ventricle demonstrates global hypokinesis. The left ventricular internal cavity size was moderately dilated left ventricle. There is mildly increased left ventricular hypertrophy. The left ventricular diastology could not be evaluated due to atrial fibrillation. Left ventricular diastolic function could not be evaluated. Normal left atrial pressure. Right Ventricle: The right ventricular size is normal.Global RV systolic function is has severely reduced systolic function. The tricuspid regurgitant velocity is 3.38 m/s, and with an assumed right atrial pressure of 15 mmHg, the estimated right ventricular systolic pressure is moderately elevated at 60.8 mmHg. Left Atrium: Left atrial size was severely dilated. Right Atrium: Right atrial size was mildly  dilated Pericardium: Trivial pericardial effusion is present. There is a moderate  pleural effusion in either the left or right lateral region. Mitral Valve: The mitral valve is normal in structure. There is moderate thickening of the mitral valve leaflet(s). Mild mitral valve regurgitation. No evidence of mitral valve stenosis by observation. Tricuspid Valve: The tricuspid valve is normal in structure. Tricuspid valve regurgitation is mild. Aortic Valve: The aortic valve is normal in structure. Aortic valve regurgitation is not visualized. The aortic valve is structurally normal, with no evidence of sclerosis or stenosis. Pulmonic Valve: The pulmonic valve was normal in structure. Pulmonic valve regurgitation is not visualized. Pulmonic regurgitation is not visualized. Aorta: The aortic root is normal in size and structure. Venous: The inferior vena cava is dilated in size with less than 50% respiratory variability, suggesting right atrial pressure of 15 mmHg.  Additional Comments: Severe global reduction in LV function; moderate LVE; mild LVH; biatrial enlargement; severe RV dysfunction; oscillating density in right atrium likely chiari network; mild MR; mild TR; moderate pulmonary hypertension.  LEFT VENTRICLE PLAX 2D LVIDd:         6.10 cm LVIDs:         5.30 cm LV PW:         1.30 cm LV IVS:        1.30 cm LVOT diam:     2.00 cm LV SV:         52 ml LV SV Index:   24.74 LVOT Area:     3.14 cm  LEFT ATRIUM              Index       RIGHT ATRIUM           Index LA diam:        5.00 cm  2.43 cm/m  RA Area:     20.60 cm LA Vol (A2C):   149.0 ml 72.36 ml/m RA Volume:   54.80 ml  26.61 ml/m LA Vol (A4C):   104.0 ml 50.51 ml/m LA Biplane Vol: 123.0 ml 59.73 ml/m   AORTA Ao Root diam: 3.30 cm TRICUSPID VALVE TR Peak grad:   45.8 mmHg TR Vmax:        339.00 cm/s  SHUNTS Systemic Diam: 2.00 cm  Kirk Ruths MD Electronically signed by Kirk Ruths MD Signature Date/Time: 10/18/2019/3:51:08 PM    Final       Medications:     Scheduled Medications: . amiodarone  200 mg Per Tube Daily  . aspirin  81 mg Per Tube Daily  . B-complex with vitamin C  1 tablet Per Tube Daily  . chlorhexidine gluconate (MEDLINE KIT)  15 mL Mouth Rinse BID  . Chlorhexidine Gluconate Cloth  6 each Topical Daily  . dexamethasone  6 mg Per Tube Daily  . feeding supplement (PRO-STAT SUGAR FREE 64)  60 mL Per Tube TID  . insulin aspart  0-15 Units Subcutaneous Q4H  . loratadine  10 mg Per Tube Daily  . mouth rinse  15 mL Mouth Rinse 10 times per day  . ramelteon  8 mg Oral QHS  . sodium chloride flush  3 mL Intravenous Once    Infusions: . dexmedetomidine (PRECEDEX) IV infusion 0.8 mcg/kg/hr (10/19/19 1500)  . famotidine (PEPCID) IV Stopped (10/19/19 1041)  . feeding supplement (VITAL AF 1.2 CAL)    . fentaNYL infusion INTRAVENOUS 50 mcg/hr (10/19/19 1500)  . heparin 10,000 units/ 20 mL infusion syringe 300 Units/hr (10/18/19 2227)  . heparin 1,300 Units/hr (10/19/19 1500)  . heparin    . norepinephrine (  LEVOPHED) Adult infusion 8 mcg/min (10/19/19 1500)  . prismasol BGK 0/2.5 500 mL/hr at 10/19/19 0928  . prismasol BGK 0/2.5 300 mL/hr at 10/19/19 1624  . prismasol BGK 4/2.5 2,500 mL/hr at 10/19/19 1617    PRN Medications: acetaminophen, bisacodyl, fentaNYL (SUBLIMAZE) injection, fentaNYL (SUBLIMAZE) injection, heparin, heparin, ondansetron (ZOFRAN) IV   Assessment/Plan:   1. Acute hypoxic respiratory failure - rapidly progressive. Suspect combination of HF and COVID PNA. Given worsening CXR findings despite CVVHD agree that COVID PNA probably predominates but HF still quite severe (if not end-stage) - vent management per CCM  2. 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 LVEF 20 RV severely HK Personally reviewed - Initial co-ox 41% c/w shock with severe volume overload - continue NE - volume status managed with CVVHD  3. 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  - Continue amio. Consider keeping on IV amio while on NE  4. AKI on CKD IV - now oliguric - on CVVHD  5. COVID + - treatment per CCM. CXR worse  6. CAD  - s/p post MI treated with staged DES to the LAD and circumflex 04/2015:  - Last cath 2016    Prox RCA lesion, 20% stenosed.  RPDA lesion, 60% stenosed.  1st Diag lesion, 60% stenosed.  Mid Cx lesion, 90% stenosed. There is a 0% residual stenosis post intervention. A drug-eluting stent was placed.   Given that COVID likely predominates his picture, the HF team will follow at a distance over the weekend. I will see again Monday. Please call if needed.   CRITICAL CARE Performed by: Glori Bickers  Total critical care time: 35 minutes  Critical care time was exclusive of separately billable procedures and treating other patients.  Critical care was necessary to treat or prevent imminent or life-threatening deterioration.  Critical care was time spent personally by me (independent of midlevel providers or residents) on the following activities: development of treatment plan with patient and/or surrogate as well as nursing, discussions with consultants, evaluation of patient's response to treatment, examination of patient, obtaining history from patient or surrogate, ordering and performing treatments and interventions, ordering and review of laboratory studies, ordering and review of radiographic studies, pulse oximetry and re-evaluation of patient's condition.     Length of Stay: 3   Glori Bickers MD 10/19/2019, 4:26 PM  Advanced Heart Failure Team Pager [phone] (M-F; 7a - 4p)  Please contact [facility] for night-coverage after hours (4p -7a ) and weekends on [email or web address]

## 2019-10-19 NOTE — Consult Note (Signed)
PULMONARY / CRITICAL CARE MEDICINE   NAME:  Paul [name], MRN:  [id number], DOB:  1961/01/12, LOS: 3 ADMISSION DATE:  10/18/2019, CONSULTATION DATE: 10/18/2019 REFERRING MD: Teaching service, CHIEF COMPLAINT: Hypoxic respiratory failure  BRIEF HISTORY:    58 year old male smoker, biventricular failure, high output of the cardiomyopathy who presents with COVID-19 positive and worsening hypoxia. HISTORY OF PRESENT ILLNESS   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  He was found to be atrial fibrillation rapid ventricular response.  He was admitted to the hospital he was also have incidental finding of COVID-19 positive.  He continued to decline and was transferred to the intensive care unit on 10/18/2019 and urgently intubated.  He has known biventricular failure has been treated with Lasix.  He is on amiodarone drip and heparin drip.  He had elevated troponins cardiology is following.  He also has worsening renal disease will need a renal consult and possible CVVH.  SIGNIFICANT PAST MEDICAL HISTORY   Biventricular failure Covid Tobacco abuse Atrial fibrillation Chronic kidney disease Degenerative joint disease with hip replacement Chronic pain  SIGNIFICANT EVENTS:  10/18/2019 transfer to ICU intubated 10/18/2019 echocardiogram shows EF 20% and severe RV hypokinesis.  STUDIES:    CULTURES:  10/18/2019 sputum>> 10/18/2019 blood culture>>  ANTIBIOTICS:  07/2019 remdesivir  LINES/TUBES:   10/18/2019 endotracheal tube CONSULTANTS:  10/18/2019 PCCM  SUBJECTIVE:  Started on CRRT, but difficulty running until this morning. Intubated for respiratory failure with significant anxiety component.  CONSTITUTIONAL: BP 128/79   Pulse 66   Temp (!) 95.9 F (35.5 C) (Axillary)   Resp 19   Ht $R'5\' 11"'hX$  (1.803 m)   Wt 86.1 kg   SpO2 (!) 87%   BMI  26.47 kg/m   I/O last 3 completed shifts: In: 3453.6 [I.V.:3349.1; IV Piggyback:104.4] Out: 1240 [Urine:540; Emesis/NG output:125; Other:575]  CVP:  [22 mmHg-25 mmHg] 25 mmHg  Vent Mode: PRVC FiO2 (%):  [40 %-100 %] 50 % Set Rate:  [18 bmp-24 bmp] 24 bmp Vt Set:  [600 mL] 600 mL PEEP:  [5 cmH20-7 cmH20] 5 cmH20 Plateau Pressure:  [15 WUJ81-19 cmH20] 28 cmH20  PHYSICAL EXAM: General: well built man sedated on mechanical ventilation. Neuro: No response to verbal stimulation of sedation.  HEENT: Endotracheal tube is in place Cardiovascular: Heart sounds are irregular currently on amiodarone drip Lungs: Clear with no dyssynchrony and Pplat 19 Abdomen: Positive bowel sounds Musculoskeletal: 1+ edema Skin: Warm  RESOLVED PROBLEM LIST   ASSESSMENT AND PLAN    Critically ill due to acute hypoxic respiratory failure requiring mechanical ventilation COVID pneumonia Critically ill due to acute on chronic biventricular failure with prominent RV dysfunction requiring titration of NE Critically ill due to atrial fibrillation with rapid response requiring amiodarone infusion. Critically ill due to acute kidney injury requiring initiation of CRRT  Time to recovery will dependent on how much the patient's respiratory failure is due to pneumonia or heart failure. He is not tolerating fluid removal well so suspect COVID pneumonia is major process at this time.   Daily Goals Checklist  Pain/Anxiety/Delirium protocol (if indicated): fentanyl and precedex infusions. Continue for now.  VAP protocol (if indicated): bundle in place Respiratory support goals: full support today, SBT tomorrow Blood pressure target: Titrate NE to keep MAP>65. Amiodarone for 24h then oral  DVT prophylaxis: systemic IV heparin for AF Nutritional status and feeding goals: initiate tube feeds -  moderate nutritional risk. GI prophylaxis: Famotidine Fluid status goals: -100 net removal per hour Urinary catheter: remove  as anuric, follow bladder scan q24h Central lines: femoral arterial line and right IJ HD catheter Glucose control:euglycemic Mobility/therapy needs: bedrest Antibiotic de-escalation:  remdesivir stopped due to LFT's and 10 days dexamethasone. Home medication reconciliation: reviewed. None relevant at this time. Daily labs: CBC and renal panel Code Status: Full  Family Communication:  Disposition: ICU   LABS  Glucose No results for input(s): GLUCAP in the last 168 hours.  BMET Recent Labs  Lab 10/18/19 0736 10/18/19 1551 10/18/19 2054 10/19/19 0237  NA 140 142 139 142  K 5.7* 6.1* 6.8* 5.3*  CL 109 108  --  111  CO2 14* 15*  --  12*  BUN 60* 69*  --  57*  CREATININE 4.17* 4.43*  --  3.87*  GLUCOSE 105* 114*  --  78    Liver Enzymes Recent Labs  Lab 10/17/19 0528 10/18/19 0736 10/18/19 1551 10/19/19 0237  AST 32 76*  --  611*  ALT 28 56*  --  433*  ALKPHOS 65 88  --  85  BILITOT 2.3* 2.7*  --  3.5*  ALBUMIN 2.7* 3.2* 3.0* 2.7*    Electrolytes Recent Labs  Lab 10/17/19 0528 10/18/19 0736 10/18/19 1551 10/19/19 0237  CALCIUM 8.1* 8.7* 8.6* 7.9*  MG 1.6* 2.2  --  2.2  PHOS  --   --  6.8* 7.1*    CBC Recent Labs  Lab 10/17/19 0528 10/18/19 0735 10/18/19 1122 10/18/19 2054 10/19/19 0237  WBC 4.2 6.6  --   --  14.9*  HGB 10.9* 13.5 14.3 15.6 13.3  HCT 35.5* 44.3 42.0 46.0 43.1  PLT 146* 199  --   --  163    ABG Recent Labs  Lab 10/18/19 0830 10/18/19 1122 10/18/19 2054  PHART 7.274* 7.252* 7.204*  PCO2ART 30.1* 40.2 23.6*  PO2ART 107 424.0* 301.0*    Coag's Recent Labs  Lab 10/18/19 2150 10/19/19 0237  APTT 69* 189*    Sepsis Markers No results for input(s): LATICACIDVEN, PROCALCITON, O2SATVEN in the last 168 hours.  Cardiac Enzymes No results for input(s): TROPONINI, PROBNP in the last 168 hours.   BNP (last 3 results) Recent Labs    10/17/19 0528  BNP 2,601.7*    ProBNP (last 3 results) Recent Labs    07/30/19 1620   PROBNP 9,273*    CRITICAL CARE Performed by: Kipp Brood   Total critical care time: 45 minutes  Critical care time was exclusive of separately billable procedures and treating other patients.  Critical care was necessary to treat or prevent imminent or life-threatening deterioration.  Critical care was time spent personally by me on the following activities: development of treatment plan with patient and/or surrogate as well as nursing, discussions with consultants, evaluation of patient's response to treatment, examination of patient, obtaining history from patient or surrogate, ordering and performing treatments and interventions, ordering and review of laboratory studies, ordering and review of radiographic studies, pulse oximetry, re-evaluation of patient's condition and participation in multidisciplinary rounds.  Kipp Brood, MD [facility] ICU Physician [place]  Pager: [phone] Mobile: [phone] After hours: [phone].

## 2019-10-19 NOTE — Progress Notes (Signed)
Patient ID: Paul [name], male   DOB: Dec 19, 1960, 58 y.o.   MRN: [id number] [facility] Progress Note   Assessment/ Plan:   1. Acute kidney Injury on chronic kidney disease stage III: With anuric acute kidney injury likely secondary to sepsis associated with severe COVID-19 pneumonia. Started on CRRT yesterday with some problems noted overnight with circuit clotting-started on intracircuit heparin without problems so far. 2. Acute hypoxic respiratory failure secondary to Covid pneumonia: Intubated, will continue to manage metabolic acidosis with CRRT with ongoing ventilator adjustments per CCM. 3. 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 4. Anion gap metabolic acidosis: Secondary to sepsis/acute kidney injury, monitor with CRRT. 5. Hyperkalemia: Secondary to acute kidney injury, monitor on CRRT.  Subjective:   Problems with circuit clotting overnight noted, now on heparin with improved filter life.   Objective:   BP 128/79   Pulse 66   Temp (!) 95.9 F (35.5 C) (Axillary)   Resp 19   Ht _0  (1.803 m)   Wt 86.1 kg   SpO2 (!) 87%   BMI 26.47 kg/m   Intake/Output Summary (Last 24 hours) at 10/19/2019 0731 Last data filed at 10/19/2019 0700 Gross per 24 hour  Intake 3453.55 ml  Output 1240 ml  Net 2213.55 ml   Weight change: 3.228 kg  Physical Exam: Patient not personally examined to limit COVID-19 contact. Pertinent positive findings discussed from earlier exam elicited by RN/CCM.  Imaging: DG CHEST PORT 1 VIEW  Result Date: 10/18/2019 CLINICAL DATA:  Central line placement EXAM: PORTABLE CHEST 1 VIEW COMPARISON:  10/18/2019 FINDINGS: Heart size is significantly enlarged. The enteric tube projects over the gastric body. There is a new right-sided central venous catheter with tip terminating over the SVC. There is no right-sided pneumothorax.  Diffuse bilateral hazy airspace opacities are again noted. There are likely small bilateral pleural effusions. There is no acute osseous abnormality. The endotracheal tube terminates above the carina. IMPRESSION: 1. Well-positioned right-sided central venous catheter without evidence for pneumothorax. 2. Remaining lines and tubes as above. 3. Otherwise, no significant interval change. Electronically Signed   By: Constance Holster M.D.   On: 10/18/2019 19:14   DG Chest Port 1 View  Result Date: 10/18/2019 CLINICAL DATA:  Intubation. Respiratory failure. COVID-19 positive. EXAM: PORTABLE CHEST 1 VIEW COMPARISON:  Earlier film, same date. FINDINGS: The endotracheal tube is 7 cm above the carina. There is a NG tube coursing down the esophagus and into the stomach. Stable cardiac enlargement. Persistent patchy bilateral lung infiltrates but overall improved aeration after intubation. IMPRESSION: Endotracheal tube and NG tubes in good position without complicating features. Slight improved lung aeration following intubation. Persistent patchy bilateral lung infiltrates. Electronically Signed   By: Marijo Sanes M.D.   On: 10/18/2019 12:49   DG Chest Port 1 View  Result Date: 10/18/2019 CLINICAL DATA:  Increased shortness of breath.  COVID-19 EXAM: PORTABLE CHEST 1 VIEW COMPARISON:  Radiograph 11/06/2019 FINDINGS: Unchanged cardiomegaly. Unchanged mediastinal contours. Development of hazy multifocal opacities throughout both lungs, more prominent on the right. Probable underlying pulmonary edema. No definite pleural effusion. No pneumothorax. IMPRESSION: 1. Development of hazy multifocal opacities throughout both lungs, more prominent on the right, suspicious for multifocal pneumonia. 2. Unchanged cardiomegaly. Probable pulmonary edema. Electronically Signed   By: Keith Rake M.D.   On: 10/18/2019 01:06   DG Abd Portable 1V  Result Date: 10/18/2019 CLINICAL DATA:  NG tube  placement. EXAM: PORTABLE  ABDOMEN - 1 VIEW COMPARISON:  02/10/2018 FINDINGS: The NG tube is in the fundal region the stomach. Unremarkable bowel gas pattern. Scattered air in the small bowel and colon. IMPRESSION: NG tube is in the fundal region of the stomach. Electronically Signed   By: Marijo Sanes M.D.   On: 10/18/2019 12:51   ECHOCARDIOGRAM COMPLETE  Result Date: 10/18/2019   ECHOCARDIOGRAM REPORT   Patient Name:   Paul [name] Date of Exam: 10/18/2019 Medical Rec #:  [id number]         Height:       71.0 in Accession #:    [id number]        Weight:       189.2 lb Date of Birth:  1960-12-09         BSA:          2.06 m Patient Age:    54 years          BP:           88/72 mmHg Patient Gender: M                 HR:           87 bpm. Exam Location:  Inpatient Procedure: 2D Echo Indications:    congestive heart failure 428.0  History:        Patient has prior history of Echocardiogram examinations, most                 recent 08/14/2019. Chronic kidney disease. Covid.,                 Arrythmias:Atrial Fibrillation; Risk Factors:Current Smoker.  Sonographer:    Johny Chess Referring Phys: [id number] TODD D MCDIARMID  Sonographer Comments: Echo performed with patient supine and on artificial respirator. IMPRESSIONS  1. Left ventricular ejection fraction, by visual estimation, is 20 to 25%. The left ventricle has severely decreased function. There is mildly increased left ventricular hypertrophy.  2. Left ventricular diastolic function could not be evaluated.  3. Moderately dilated left ventricular internal cavity size.  4. The left ventricle demonstrates global hypokinesis.  5. Global right ventricle has severely reduced systolic function.The right ventricular size is normal.  6. Left atrial size was severely dilated.  7. Right atrial size was mildly dilated.  8. Moderate pleural effusion.  9. Trivial pericardial effusion is present. 10. Moderate thickening of the mitral valve leaflet(s). 11. The mitral valve is normal in  structure. Mild mitral valve regurgitation. No evidence of mitral stenosis. 12. The tricuspid valve is normal in structure. Tricuspid valve regurgitation is mild. 13. The aortic valve is normal in structure. Aortic valve regurgitation is not visualized. No evidence of aortic valve sclerosis or stenosis. 14. The pulmonic valve was normal in structure. Pulmonic valve regurgitation is not visualized. 15. Moderately elevated pulmonary artery systolic pressure. 16. The inferior vena cava is dilated in size with <50% respiratory variability, suggesting right atrial pressure of 15 mmHg. 17. Severe global reduction in LV function; moderate LVE; mild LVH; biatrial enlargement; severe RV dysfunction; oscillating density in right atrium likely chiari network; mild MR; mild TR; moderate pulmonary hypertension. FINDINGS  Left Ventricle: Left ventricular ejection fraction, by visual estimation, is 20 to 25%. The left ventricle has severely decreased function. The left ventricle demonstrates global hypokinesis. The left ventricular internal cavity size was moderately dilated left ventricle. There is mildly increased left ventricular hypertrophy. The left ventricular diastology could not be evaluated due  to atrial fibrillation. Left ventricular diastolic function could not be evaluated. Normal left atrial pressure. Right Ventricle: The right ventricular size is normal.Global RV systolic function is has severely reduced systolic function. The tricuspid regurgitant velocity is 3.38 m/s, and with an assumed right atrial pressure of 15 mmHg, the estimated right ventricular systolic pressure is moderately elevated at 60.8 mmHg. Left Atrium: Left atrial size was severely dilated. Right Atrium: Right atrial size was mildly dilated Pericardium: Trivial pericardial effusion is present. There is a moderate pleural effusion in either the left or right lateral region. Mitral Valve: The mitral valve is normal in structure. There is moderate  thickening of the mitral valve leaflet(s). Mild mitral valve regurgitation. No evidence of mitral valve stenosis by observation. Tricuspid Valve: The tricuspid valve is normal in structure. Tricuspid valve regurgitation is mild. Aortic Valve: The aortic valve is normal in structure. Aortic valve regurgitation is not visualized. The aortic valve is structurally normal, with no evidence of sclerosis or stenosis. Pulmonic Valve: The pulmonic valve was normal in structure. Pulmonic valve regurgitation is not visualized. Pulmonic regurgitation is not visualized. Aorta: The aortic root is normal in size and structure. Venous: The inferior vena cava is dilated in size with less than 50% respiratory variability, suggesting right atrial pressure of 15 mmHg.  Additional Comments: Severe global reduction in LV function; moderate LVE; mild LVH; biatrial enlargement; severe RV dysfunction; oscillating density in right atrium likely chiari network; mild MR; mild TR; moderate pulmonary hypertension.  LEFT VENTRICLE PLAX 2D LVIDd:         6.10 cm LVIDs:         5.30 cm LV PW:         1.30 cm LV IVS:        1.30 cm LVOT diam:     2.00 cm LV SV:         52 ml LV SV Index:   24.74 LVOT Area:     3.14 cm  LEFT ATRIUM              Index       RIGHT ATRIUM           Index LA diam:        5.00 cm  2.43 cm/m  RA Area:     20.60 cm LA Vol (A2C):   149.0 ml 72.36 ml/m RA Volume:   54.80 ml  26.61 ml/m LA Vol (A4C):   104.0 ml 50.51 ml/m LA Biplane Vol: 123.0 ml 59.73 ml/m   AORTA Ao Root diam: 3.30 cm TRICUSPID VALVE TR Peak grad:   45.8 mmHg TR Vmax:        339.00 cm/s  SHUNTS Systemic Diam: 2.00 cm  Kirk Ruths MD Electronically signed by Kirk Ruths MD Signature Date/Time: 10/18/2019/3:51:08 PM    Final     Labs: BMET Recent Labs  Lab 10/15/2019 1749 10/17/19 0962 10/18/19 0736 10/18/19 1122 10/18/19 1551 10/18/19 2054 10/19/19 0237  NA 142 143 140 139 142 139 142  K 4.7 3.9 5.7* 5.0 6.1* 6.8* 5.3*  CL 111 111  109  --  108  --  111  CO2 22 19* 14*  --  15*  --  12*  GLUCOSE 156* 137* 105*  --  114*  --  78  BUN 46* 45* 60*  --  69*  --  57*  CREATININE 2.84* 2.65* 4.17*  --  4.43*  --  3.87*  CALCIUM 8.5* 8.1* 8.7*  --  8.6*  --  7.9*  PHOS  --   --   --   --  6.8*  --  7.1*   CBC Recent Labs  Lab 10/12/2019 1749 10/17/19 0528 10/18/19 0735 10/18/19 1122 10/18/19 2054 10/19/19 0237  WBC 4.5 4.2 6.6  --   --  14.9*  NEUTROABS  --   --   --   --   --  13.1*  HGB 12.7* 10.9* 13.5 14.3 15.6 13.3  HCT 41.4 35.5* 44.3 42.0 46.0 43.1  MCV 80.7 80.1 80.0  --   --  80.7  PLT 189 146* 199  --   --  163    Medications:    . aspirin EC  81 mg Oral Daily  . chlorhexidine gluconate (MEDLINE KIT)  15 mL Mouth Rinse BID  . Chlorhexidine Gluconate Cloth  6 each Topical Daily  . dexamethasone  6 mg Oral Daily  . fluticasone  2 spray Each Nare Daily  . hydrocortisone-pramoxine  1 applicator Rectal QID  . loratadine  10 mg Oral Daily  . mouth rinse  15 mL Mouth Rinse 10 times per day  . ramelteon  8 mg Oral QHS  . sodium chloride flush  3 mL Intravenous Once   Elmarie Shiley, MD 10/19/2019, 7:31 AM

## 2019-10-19 NOTE — Progress Notes (Signed)
Physical Therapy Discharge Patient Details Name: MALVERN KADLEC MRN: [id number] DOB: 29-Oct-1961 Today's Date: 10/19/2019 Time:  -     Patient discharged from PT services secondary to medical decline - will need to re-order PT to resume therapy services.  Please see latest therapy progress note for current level of functioning and progress toward goals.    Progress and discharge plan discussed with patient and/or caregiver: Patient unable to participate in discharge planning and no caregivers available  GP     Shary Decamp [facility] 10/19/2019, 8:54 AM  [place] Pager [phone] Office [phone]

## 2019-10-19 NOTE — Progress Notes (Signed)
OT Cancellation Note  Patient Details Name: Paul [name] MRN: [id number] DOB: 1961/03/31   Cancelled Treatment:    Reason Eval/Treat Not Completed: Medical issues which prohibited therapy;Patient not medically ready Pt with recent medical decline, not ready to engage in OT POC at this time. OT will sign off at this time. Please re consult if pt status changes. Thank you.  Zenovia Jarred, MSOT, OTR/L Behavioral Health OT/ Acute Relief OT [facility] Office: [phone]  Zenovia Jarred 10/19/2019, 8:56 AM

## 2019-10-19 NOTE — Progress Notes (Signed)
Initial Nutrition Assessment  DOCUMENTATION CODES:   Not applicable  INTERVENTION:  -Initiate Vital 1.2 @ 40 ml/hr, advance 10 ml every 8 hrs to goal rate 50 ml/hr (1200 ml/day)  -Prostat 60 ml TID via tube  -B complex with C daily via tube  Tube feed regimen at goal 50 ml/hr (122ml/day) provides 2040 kcal, 180 grams protein, 972 ml H20 from formula   NUTRITION DIAGNOSIS:   Increased nutrient needs related to acute illness, chronic illness(acute hypoxic respiratory failure secondary to COVID pneumonia on chronic biventricular failure) as evidenced by estimated needs, NPO status.    GOAL:   Provide needs based on ASPEN/SCCM guidelines   MONITOR:   Vent status, I & O's, Labs, Weight trends, TF tolerance  REASON FOR ASSESSMENT:   Ventilator, Consult Enteral/tube feeding initiation and management  ASSESSMENT:  RD working remotely.  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 Patient found to be in Afib with RVR and COVID-19 positive.  Admitted with acute on chronic biventricular failure; hypoxic respiratory failure secondary to COVID pneumonia.  12/10 - Intubated 12/10 - HD catheter via R IJ; initiated CRRT 12/10 - Arterial Catheter 12/10 - Echo shows EF 20% and severe RV hypokinesis  Current wt 86.1 kg (189.4 lbs) noted BLE mild pitting edema per review of RN assessment.  I/Os: + 1615 ml since admit   +2213 ml x 24 hrs UOP: 540 ml x 24 hrs  Patient is currently intubated on ventilator support MV: 13.9  L/min Temp (24hrs), Avg:95.8 F (35.4 C), Min:94.3 F (34.6 C), Max:96.5 F (35.8 C)  Not on Propofol  Medications reviewed and include: Pacerone, Decadron, SSI, Rozerm, Remdesivir Drips: Precedex 4 mcg/ml Norepinephrine 12 mcg/min Amiodarone 1.8 mg/ml Fentanyl 10 mcg/ml Heparin 13  ml/hr Levophed 4 mg Prismasol BGK 2500 ml/hr  Labs: K 5.3 (H), BUN 57 (H), Cr 3.87 (H), P 7.1 (H), Albumin 2.7 (L), Corrected Ca 8.9 (WNL), WBC 14.9 (H), 12/9 BNP 2601 (H)   NUTRITION - FOCUSED PHYSICAL EXAM: Unable to complete at this time, RD working remotely.    Diet Order:   Diet Order    None      EDUCATION NEEDS:   No education needs have been identified at this time  Skin:  Skin Assessment: Reviewed RN Assessment  Last BM:  12/8  Height:   Ht Readings from Last 1 Encounters:  10/18/19 $RemoveB'5\' 11"'AJZNesJF$  (1.803 m)    Weight:   Wt Readings from Last 1 Encounters:  10/19/19 86.1 kg    Ideal Body Weight:  78.2 kg  BMI:  Body mass index is 26.47 kg/m.  Estimated Nutritional Needs:   Kcal:  1943  Protein:  172  Fluid:  per MD   Lajuan Lines, RD, LDN Clinical Nutrition [place] Telephone [phone] After Hours/Weekend Pager: [phone]

## 2019-10-19 NOTE — Progress Notes (Signed)
ABG drawn results did not cross over to Epic.   PH:     7.28 Pc02:  29.2 Pa02:  195 Bicarb: 14

## 2019-10-19 NOTE — Progress Notes (Signed)
CRITICAL VALUE ALERT  Critical Value: PTT 189  Date & Time Notied:  10/19/19 5:20 AM   Provider Notified: Warren Lacy and pharmacy  Orders Received/Actions taken: will await Heparin level results.

## 2019-10-20 LAB — GLUCOSE, CAPILLARY
Glucose-Capillary: 105 mg/dL — ABNORMAL HIGH (ref 70–99)
Glucose-Capillary: 115 mg/dL — ABNORMAL HIGH (ref 70–99)
Glucose-Capillary: 135 mg/dL — ABNORMAL HIGH (ref 70–99)
Glucose-Capillary: 147 mg/dL — ABNORMAL HIGH (ref 70–99)
Glucose-Capillary: 169 mg/dL — ABNORMAL HIGH (ref 70–99)

## 2019-10-20 LAB — MAGNESIUM: Magnesium: 2.2 mg/dL (ref 1.7–2.4)

## 2019-10-20 LAB — COMPREHENSIVE METABOLIC PANEL
ALT: 1044 U/L — ABNORMAL HIGH (ref 0–44)
AST: 1146 U/L — ABNORMAL HIGH (ref 15–41)
Albumin: 2.4 g/dL — ABNORMAL LOW (ref 3.5–5.0)
Alkaline Phosphatase: 83 U/L (ref 38–126)
Anion gap: 11 (ref 5–15)
BUN: 45 mg/dL — ABNORMAL HIGH (ref 6–20)
CO2: 17 mmol/L — ABNORMAL LOW (ref 22–32)
Calcium: 7.1 mg/dL — ABNORMAL LOW (ref 8.9–10.3)
Chloride: 106 mmol/L (ref 98–111)
Creatinine, Ser: 2.3 mg/dL — ABNORMAL HIGH (ref 0.61–1.24)
GFR calc Af Amer: 34 mL/min — ABNORMAL LOW (ref >60)
GFR calc non Af Amer: 30 mL/min — ABNORMAL LOW (ref >60)
Glucose, Bld: 183 mg/dL — ABNORMAL HIGH (ref 70–99)
Potassium: 4.7 mmol/L (ref 3.5–5.1)
Sodium: 134 mmol/L — ABNORMAL LOW (ref 135–145)
Total Bilirubin: 4.5 mg/dL — ABNORMAL HIGH (ref 0.3–1.2)
Total Protein: 5 g/dL — ABNORMAL LOW (ref 6.5–8.1)

## 2019-10-20 LAB — LACTIC ACID, PLASMA: Lactic Acid, Venous: 3.3 mmol/L (ref 0.5–1.9)

## 2019-10-20 LAB — POCT I-STAT 7, (LYTES, BLD GAS, ICA,H+H)
Acid-base deficit: 6 mmol/L — ABNORMAL HIGH (ref 0.0–2.0)
Bicarbonate: 18.6 mmol/L — ABNORMAL LOW (ref 20.0–28.0)
Calcium, Ion: 0.99 mmol/L — ABNORMAL LOW (ref 1.15–1.40)
HCT: 49 % (ref 39.0–52.0)
Hemoglobin: 16.7 g/dL (ref 13.0–17.0)
O2 Saturation: 99 %
Potassium: 4.5 mmol/L (ref 3.5–5.1)
Sodium: 135 mmol/L (ref 135–145)
TCO2: 20 mmol/L — ABNORMAL LOW (ref 22–32)
pCO2 arterial: 34 mmHg (ref 32.0–48.0)
pH, Arterial: 7.346 — ABNORMAL LOW (ref 7.350–7.450)
pO2, Arterial: 121 mmHg — ABNORMAL HIGH (ref 83.0–108.0)

## 2019-10-20 LAB — CBC
HCT: 45 % (ref 39.0–52.0)
Hemoglobin: 14.4 g/dL (ref 13.0–17.0)
MCH: 24.6 pg — ABNORMAL LOW (ref 26.0–34.0)
MCHC: 32 g/dL (ref 30.0–36.0)
MCV: 76.9 fL — ABNORMAL LOW (ref 80.0–100.0)
Platelets: 124 10*3/uL — ABNORMAL LOW (ref 150–400)
RBC: 5.85 MIL/uL — ABNORMAL HIGH (ref 4.22–5.81)
RDW: 15.7 % — ABNORMAL HIGH (ref 11.5–15.5)
WBC: 4.2 10*3/uL (ref 4.0–10.5)
nRBC: 7.6 % — ABNORMAL HIGH (ref 0.0–0.2)

## 2019-10-20 LAB — APTT: aPTT: >200 seconds (ref 24–36)

## 2019-10-20 LAB — DIC (DISSEMINATED INTRAVASCULAR COAGULATION)PANEL
D-Dimer, Quant: 10.31 ug/mL-FEU — ABNORMAL HIGH (ref 0.00–0.50)
Fibrinogen: 564 mg/dL — ABNORMAL HIGH (ref 210–475)
INR: 2.5 — ABNORMAL HIGH (ref 0.8–1.2)
Platelets: 113 10*3/uL — ABNORMAL LOW (ref 150–400)
Prothrombin Time: 27 seconds — ABNORMAL HIGH (ref 11.4–15.2)
aPTT: >200 seconds (ref 24–36)

## 2019-10-20 LAB — HEPARIN LEVEL (UNFRACTIONATED)
Heparin Unfractionated: 0.87 IU/mL — ABNORMAL HIGH (ref 0.30–0.70)
Heparin Unfractionated: 0.61 IU/mL (ref 0.30–0.70)

## 2019-10-20 LAB — PHOSPHORUS: Phosphorus: 4.1 mg/dL (ref 2.5–4.6)

## 2019-10-20 MED ORDER — FAMOTIDINE 40 MG/5ML PO SUSR
20.0000 mg | Freq: Every day | ORAL | Status: DC
  Administered 2019-10-20 – 2019-10-21 (×2): 20 mg
  Filled 2019-10-20 (×3): qty 2.5

## 2019-10-20 MED ORDER — NOREPINEPHRINE 16 MG/250ML-% IV SOLN
0.0000 ug/min | INTRAVENOUS | Status: DC
  Administered 2019-10-20 (×2): 25 ug/min via INTRAVENOUS
  Administered 2019-10-21: 13 ug/min via INTRAVENOUS
  Administered 2019-10-21: 25 ug/min via INTRAVENOUS
  Administered 2019-10-22 (×2): 40 ug/min via INTRAVENOUS
  Filled 2019-10-20 (×6): qty 250

## 2019-10-20 NOTE — Progress Notes (Signed)
No restraints in place since start of this shift at 1900.  Safety mitts placed on patient and will monitor for need of restraints.

## 2019-10-20 NOTE — Progress Notes (Signed)
[facility] for Heparin  Indication: atrial fibrillation  Allergies  Allergen Reactions  . 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 Ibuprofen Other (See Comments)    Kidney damage    Patient Measurements: Height: 5\' 11"  (180.3 cm) Weight: 189 lb 9.5 oz (86 kg) IBW/kg (Calculated) : 75.3 Heparin Dosing Weight: 87.5 kg  Vital Signs: Temp: 97.5 F (36.4 C) (12/12 0525) Temp Source: Axillary (12/12 0525) BP: 83/70 (12/12 0700) Pulse Rate: 79 (12/12 0700)  Labs: Recent Labs    10/18/19 0735 10/18/19 0736 10/18/19 2054 10/19/19 0237 10/19/19 0257 10/19/19 1619 10/19/19 1806 10/20/19 0257 10/20/19 0500  HGB 13.5   < > 15.6 13.3  --   --   --  14.4  --   HCT 44.3   < > 46.0 43.1  --   --   --  45.0  --   PLT 199  --   --  163  --   --   --  124*  --   APTT  --    < >  --  189*  --   --  >200* >200*  --   HEPARINUNFRC 0.64  --   --   --  0.45  --   --   --  0.87*  CREATININE  --   --   --  3.87*  --  2.88*  --  2.30*  --    < > = values in this interval not displayed.    Estimated Creatinine Clearance: 36.4 mL/min (A) (by C-G formula based on SCr of 2.3 mg/dL (H)).   Assessment: 61 yom that presented to the ED in A-fib w/ RVR. Patient's CHA2DS2-VAsc score is > 2. With this pharmacy has been asked to dose heparin at this time.   Patient was transferred to ICU, Intubated, and placed on CRRT yesterday  Hep lvl have been therapeutic on IV heparin - despite this, issues with clotting of CRRT circuit. We have had these issues w COVID, so fixed rate circuit heparin added. Heparin level now 0.87 will decrease rate by 150 units/hr.  Goal of Therapy:  Heparin level 0.3-0.7 units/ml Monitor platelets by anticoagulation protocol: Yes   Plan:  Decrease Heparin to 1150 units/hr (+ 300 units/hr fixed rate in CRRT) 6 hour HL and then daily HL CBC  F/u change to [place], PharmD PGY2 Infectious Disease  Pharmacy Resident   Please check AMION for all [place] numbers  10/20/2019 7:22 AM

## 2019-10-20 NOTE — Progress Notes (Addendum)
FPTS Interim Progress Note  Daily social note. Mr Engelstad was admitted to the ICU on 12/10 for worsening of Acute hypoxic respiratory failure 2/2 CHF and COVID pneumonia. We appreciate the great care he has received from CCM. We look forward to taking over his care once he is stable to transition out of ICU.  Lattie Haw, MD 10/20/2019, 5:23 AM PGY-1, [place] [facility] pager [phone]

## 2019-10-20 NOTE — Progress Notes (Signed)
CRITICAL VALUE ALERT  Critical Value:  Lactic Acid 3.3  Date & Time Notied:  10/20/19 0935  Provider Notified: Dr. Tamala Klaire Court  Orders Received/Actions taken: Continue to monitor

## 2019-10-20 NOTE — Progress Notes (Signed)
Patient arrived to bed 208-3 Paul [name] via [place] from [facility] 49M.  He arrived vented with settings at 40%, Peep 5, Rate 24, Tidal Volume 600. He is currently on Fentanyl 22mcg, Heparin 11.5 mL/hr, Levo 25 mcg, and Precedex 1.1 mcg He has a Rt. Femoral Arterial Line He has a Rt. IJ triple lumen HD Cath

## 2019-10-20 NOTE — Progress Notes (Signed)
PULMONARY / CRITICAL CARE MEDICINE   NAME:  Paul [name], MRN:  [id number], DOB:  [birth year], LOS: 4 ADMISSION DATE:  10/29/2019, CONSULTATION DATE: 10/18/2019 REFERRING MD: Teaching service, CHIEF COMPLAINT: Hypoxic respiratory failure  BRIEF HISTORY:    58 year old male smoker, biventricular failure, high output of the cardiomyopathy who presents with COVID-19 positive and worsening hypoxia. 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  He was found to be atrial fibrillation rapid ventricular response.  He was admitted to the hospital he was also have incidental finding of COVID-19 positive.  He continued to decline and was transferred to the intensive care unit on 10/18/2019 and urgently intubated.  He has known biventricular failure has been treated with Lasix.  He is on amiodarone drip and heparin drip.  He had elevated troponins cardiology is following.  He also has worsening renal disease will need a renal consult and possible CVVH.  SIGNIFICANT PAST MEDICAL HISTORY   Biventricular failure Covid Tobacco abuse Atrial fibrillation Chronic kidney disease Degenerative joint disease with hip replacement Chronic pain  SIGNIFICANT EVENTS:  10/18/2019 transfer to ICU intubated 10/18/2019 echocardiogram shows EF 20% and severe RV hypokinesis.  STUDIES:    CULTURES:  10/18/2019 sputum>> never sent 10/18/2019 blood culture>> never sent  ANTIBIOTICS:  07/2019 remdesivir  LINES/TUBES:   10/18/2019 endotracheal tube CONSULTANTS:  10/18/2019 PCCM  SUBJECTIVE:  No events, minimal O2 needs, heavily sedated.  CONSTITUTIONAL: BP (!) 83/70   Pulse 79   Temp (!) 97.5 F (36.4 C) (Axillary)   Resp (!) 24   Ht $R'5\' 11"'iY$  (1.803 m)   Wt 86 kg   SpO2 99%   BMI 26.44 kg/m   I/O last 3 completed shifts: In: 5047.4  [I.V.:4034.3; NG/GT:858.7; IV Piggyback:154.4] Out: 1027 [Urine:75; Emesis/NG output:125; Other:4240]     Vent Mode: PRVC FiO2 (%):  [40 %] 40 % Set Rate:  [24 bmp] 24 bmp Vt Set:  [600 mL] 600 mL PEEP:  [5 cmH20] 5 cmH20 Plateau Pressure:  [14 cmH20-20 cmH20] 19 cmH20  PHYSICAL EXAM: GEN: middle aged man on vent HEENT: pupils pinpoint, reactive, trachea midline CV: Irregular, ext warm PULM: Clear, no accessory muscle use GI: slightly distended, nontender, +BS EXT: No edema NEURO: Moves all 4 ext to command but very slowly PSYCH: Cannot assess SKIN: Right [place] line looks CDI  Labs notable for worsening liver indices, platelets dropping, white count normalized   RESOLVED PROBLEM LIST   ASSESSMENT AND PLAN    # Acute hypoxemic respiratory failure # Acute on chronic biventricular failure- large RV component likely related to COVID infection # Cardiogenic shock- on levophed # Acute on chronic renal failure- on CRRT, switching to neutral pull as appears more euvolemic # Reactive Afib- on amiodarone, improved # Congestive hepatic failure- related to acute heart failure, cannot rule out superimosed COVID and/or remdesivir effect # Dropping plts, elevated PTT- keep an eye on  - DIC panel, ABG, INR - Stop remdesivir with current degree of transaminitis, trend LFTs - Continue usual course of dexamethasone - Oral amiodarone fine for now may need to hold if LFTs worsen - Usual vent/VAP bundles - SAT/SBT this AM but very hesitant to extubate with ongoing cardiogenic shock and worsening liver indices  Daily Goals Checklist  Pain/Anxiety/Delirium protocol (if indicated): fentanyl and precedex infusions, daily SATs  VAP protocol (if indicated): bundle in place Respiratory  support goals: SBT Blood pressure target: Titrate NE to keep MAP>65. Amiodarone for 24h then oral  DVT prophylaxis: systemic IV heparin for AF Nutritional status and feeding goals: TF GI prophylaxis:  Famotidine Fluid status goals: -100 net removal per hour Urinary catheter: remove as anuric, follow bladder scan q24h Central lines: femoral arterial line and right IJ HD catheter Glucose control: SSI Mobility/therapy needs: bedrest Dispo- transfer to [facility] ICU for ongoing care of COVID-associated cardiogenic shock, respiratory failure   LABS  Glucose Recent Labs  Lab 10/19/19 1133 10/19/19 1609 10/19/19 1954 10/19/19 2305 10/20/19 0307 10/20/19 0805  GLUCAP 118* 101* 115* 129* 169* 147*    BMET Recent Labs  Lab 10/19/19 0237 10/19/19 1619 10/20/19 0257  NA 142 136 134*  K 5.3* 4.3 4.7  CL 111 105 106  CO2 12* 16* 17*  BUN 57* 43* 45*  CREATININE 3.87* 2.88* 2.30*  GLUCOSE 78 151* 183*    Liver Enzymes Recent Labs  Lab 10/18/19 0736 10/19/19 0237 10/19/19 1619 10/20/19 0257  AST 76* 611*  --  1,146*  ALT 56* 433*  --  1,044*  ALKPHOS 88 85  --  83  BILITOT 2.7* 3.5*  --  4.5*  ALBUMIN 3.2* 2.7* 2.6* 2.4*    Electrolytes Recent Labs  Lab 10/19/19 0237 10/19/19 1619 10/19/19 1700 10/20/19 0257  CALCIUM 7.9* 7.6*  --  7.1*  MG 2.2  --  2.3 2.2  PHOS 7.1* 5.1* 5.0* 4.1    CBC Recent Labs  Lab 10/18/19 0735 10/18/19 2054 10/19/19 0237 10/20/19 0257  WBC 6.6  --  14.9* 4.2  HGB 13.5 15.6 13.3 14.4  HCT 44.3 46.0 43.1 45.0  PLT 199  --  163 124*    ABG Recent Labs  Lab 10/18/19 0830 10/18/19 1122 10/18/19 2054  PHART 7.274* 7.252* 7.204*  PCO2ART 30.1* 40.2 23.6*  PO2ART 107 424.0* 301.0*    Coag's Recent Labs  Lab 10/19/19 0237 10/19/19 1806 10/20/19 0257  APTT 189* >200* >200*    Sepsis Markers No results for input(s): LATICACIDVEN, PROCALCITON, O2SATVEN in the last 168 hours.  Cardiac Enzymes No results for input(s): TROPONINI, PROBNP in the last 168 hours.   BNP (last 3 results) Recent Labs    10/17/19 0528  BNP 2,601.7*    ProBNP (last 3 results) Recent Labs    07/30/19 1620  PROBNP 9,273*    CRITICAL  CARE Performed by: Candee Furbish   35 minutes critical care time not including any separately billable procedures.

## 2019-10-20 NOTE — Progress Notes (Signed)
Patient ID: Paul [name], male   DOB: 01/09/1961, 58 y.o.   MRN: [id number] [facility] Progress Note   Assessment/ Plan:   1. Acute kidney Injury on chronic kidney disease stage III: With anuric acute kidney injury likely secondary to sepsis associated with severe COVID-19 pneumonia.  Tolerating CRRT without problems.  Given his current volume status and continued pressor dependency, will switch him to net even fluid balance (input = output) and work on weaning down pressors.  Improving hyperkalemia and metabolic acidosis. 2. Acute hypoxic respiratory failure secondary to Covid pneumonia: He is ventilator dependent, continue CRRT. 3. 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 4. Anion gap metabolic acidosis: Secondary to sepsis/acute kidney injury, monitor with CRRT. 5. Hyperkalemia: Secondary to acute kidney injury, monitor on CRRT.  Subjective:   Without acute events overnight, appears to be responding more to commands.   Objective:   BP (!) 78/59   Pulse 73   Temp (!) 97.5 F (36.4 C) (Axillary)   Resp (!) 24   Ht _0  (1.803 m)   Wt 86 kg   SpO2 99%   BMI 26.44 kg/m   Intake/Output Summary (Last 24 hours) at 10/20/2019 9371 Last data filed at 10/20/2019 0600 Gross per 24 hour  Intake 3005.72 ml  Output 3615 ml  Net -609.28 ml   Weight change: -0.1 kg  Physical Exam: Reviewed from door due to COVID + status. General: Intubated, appears comfortable Cardiovascular: Irregular rhythm with normal rate seen on monitor Respiratory: On ventilator (RN at bedside reports improving breath sounds) Abdomen: Nondistended Extremities: No lower extremity edema (per RN)  Imaging: AM DG Chest Port 1 View  Result Date: 10/19/2019 CLINICAL DATA:  58 year old male with COVID-19. N STEMI. EXAM: PORTABLE CHEST 1 VIEW COMPARISON:  10/18/2019 portable chest and earlier. FINDINGS: Portable AP semi  upright view at 0501 hours. Stable endotracheal tube at the level the clavicles. Stable enteric tube coiled in the left upper quadrant. Stable right IJ central line. Stable cardiomegaly with decreasing retrocardiac and left lung ventilation. Stable mediastinal contours. Stable pulmonary vascularity, no overt edema. No pneumothorax. Small if any pleural effusions. IMPRESSION: 1. Stable lines and tubes. 2. Increasing lower lobe collapse or consolidation since yesterday. 3. Stable cardiomegaly and pulmonary vascular congestion without overt edema. Small if any pleural effusions. Electronically Signed   By: Genevie Ann M.D.   On: 10/19/2019 07:55   DG CHEST PORT 1 VIEW  Result Date: 10/18/2019 CLINICAL DATA:  Central line placement EXAM: PORTABLE CHEST 1 VIEW COMPARISON:  10/18/2019 FINDINGS: Heart size is significantly enlarged. The enteric tube projects over the gastric body. There is a new right-sided central venous catheter with tip terminating over the SVC. There is no right-sided pneumothorax. Diffuse bilateral hazy airspace opacities are again noted. There are likely small bilateral pleural effusions. There is no acute osseous abnormality. The endotracheal tube terminates above the carina. IMPRESSION: 1. Well-positioned right-sided central venous catheter without evidence for pneumothorax. 2. Remaining lines and tubes as above. 3. Otherwise, no significant interval change. Electronically Signed   By: Constance Holster M.D.   On: 10/18/2019 19:14   DG Chest Port 1 View  Result Date: 10/18/2019 CLINICAL DATA:  Intubation. Respiratory failure. COVID-19 positive. EXAM: PORTABLE CHEST 1 VIEW COMPARISON:  Earlier film, same date. FINDINGS: The endotracheal tube is 7 cm above the carina. There is a NG tube coursing down the esophagus and into the stomach. Stable cardiac enlargement. Persistent patchy  bilateral lung infiltrates but overall improved aeration after intubation. IMPRESSION: Endotracheal tube and NG  tubes in good position without complicating features. Slight improved lung aeration following intubation. Persistent patchy bilateral lung infiltrates. Electronically Signed   By: Marijo Sanes M.D.   On: 10/18/2019 12:49   DG Abd Portable 1V  Result Date: 10/18/2019 CLINICAL DATA:  NG tube placement. EXAM: PORTABLE ABDOMEN - 1 VIEW COMPARISON:  02/10/2018 FINDINGS: The NG tube is in the fundal region the stomach. Unremarkable bowel gas pattern. Scattered air in the small bowel and colon. IMPRESSION: NG tube is in the fundal region of the stomach. Electronically Signed   By: Marijo Sanes M.D.   On: 10/18/2019 12:51   ECHOCARDIOGRAM COMPLETE  Result Date: 10/18/2019   ECHOCARDIOGRAM REPORT   Patient Name:   Paul [name] Date of Exam: 10/18/2019 Medical Rec #:  [id number]         Height:       71.0 in Accession #:    [id number]        Weight:       189.2 lb Date of Birth:  1961-03-16         BSA:          2.06 m Patient Age:    72 years          BP:           88/72 mmHg Patient Gender: M                 HR:           87 bpm. Exam Location:  Inpatient Procedure: 2D Echo Indications:    congestive heart failure 428.0  History:        Patient has prior history of Echocardiogram examinations, most                 recent 08/14/2019. Chronic kidney disease. Covid.,                 Arrythmias:Atrial Fibrillation; Risk Factors:Current Smoker.  Sonographer:    Johny Chess Referring Phys: [id number] TODD D MCDIARMID  Sonographer Comments: Echo performed with patient supine and on artificial respirator. IMPRESSIONS  1. Left ventricular ejection fraction, by visual estimation, is 20 to 25%. The left ventricle has severely decreased function. There is mildly increased left ventricular hypertrophy.  2. Left ventricular diastolic function could not be evaluated.  3. Moderately dilated left ventricular internal cavity size.  4. The left ventricle demonstrates global hypokinesis.  5. Global right ventricle has severely  reduced systolic function.The right ventricular size is normal.  6. Left atrial size was severely dilated.  7. Right atrial size was mildly dilated.  8. Moderate pleural effusion.  9. Trivial pericardial effusion is present. 10. Moderate thickening of the mitral valve leaflet(s). 11. The mitral valve is normal in structure. Mild mitral valve regurgitation. No evidence of mitral stenosis. 12. The tricuspid valve is normal in structure. Tricuspid valve regurgitation is mild. 13. The aortic valve is normal in structure. Aortic valve regurgitation is not visualized. No evidence of aortic valve sclerosis or stenosis. 14. The pulmonic valve was normal in structure. Pulmonic valve regurgitation is not visualized. 15. Moderately elevated pulmonary artery systolic pressure. 16. The inferior vena cava is dilated in size with <50% respiratory variability, suggesting right atrial pressure of 15 mmHg. 17. Severe global reduction in LV function; moderate LVE; mild LVH; biatrial enlargement; severe RV dysfunction; oscillating density in right atrium likely chiari network; mild  MR; mild TR; moderate pulmonary hypertension. FINDINGS  Left Ventricle: Left ventricular ejection fraction, by visual estimation, is 20 to 25%. The left ventricle has severely decreased function. The left ventricle demonstrates global hypokinesis. The left ventricular internal cavity size was moderately dilated left ventricle. There is mildly increased left ventricular hypertrophy. The left ventricular diastology could not be evaluated due to atrial fibrillation. Left ventricular diastolic function could not be evaluated. Normal left atrial pressure. Right Ventricle: The right ventricular size is normal.Global RV systolic function is has severely reduced systolic function. The tricuspid regurgitant velocity is 3.38 m/s, and with an assumed right atrial pressure of 15 mmHg, the estimated right ventricular systolic pressure is moderately elevated at 60.8 mmHg.  Left Atrium: Left atrial size was severely dilated. Right Atrium: Right atrial size was mildly dilated Pericardium: Trivial pericardial effusion is present. There is a moderate pleural effusion in either the left or right lateral region. Mitral Valve: The mitral valve is normal in structure. There is moderate thickening of the mitral valve leaflet(s). Mild mitral valve regurgitation. No evidence of mitral valve stenosis by observation. Tricuspid Valve: The tricuspid valve is normal in structure. Tricuspid valve regurgitation is mild. Aortic Valve: The aortic valve is normal in structure. Aortic valve regurgitation is not visualized. The aortic valve is structurally normal, with no evidence of sclerosis or stenosis. Pulmonic Valve: The pulmonic valve was normal in structure. Pulmonic valve regurgitation is not visualized. Pulmonic regurgitation is not visualized. Aorta: The aortic root is normal in size and structure. Venous: The inferior vena cava is dilated in size with less than 50% respiratory variability, suggesting right atrial pressure of 15 mmHg.  Additional Comments: Severe global reduction in LV function; moderate LVE; mild LVH; biatrial enlargement; severe RV dysfunction; oscillating density in right atrium likely chiari network; mild MR; mild TR; moderate pulmonary hypertension.  LEFT VENTRICLE PLAX 2D LVIDd:         6.10 cm LVIDs:         5.30 cm LV PW:         1.30 cm LV IVS:        1.30 cm LVOT diam:     2.00 cm LV SV:         52 ml LV SV Index:   24.74 LVOT Area:     3.14 cm  LEFT ATRIUM              Index       RIGHT ATRIUM           Index LA diam:        5.00 cm  2.43 cm/m  RA Area:     20.60 cm LA Vol (A2C):   149.0 ml 72.36 ml/m RA Volume:   54.80 ml  26.61 ml/m LA Vol (A4C):   104.0 ml 50.51 ml/m LA Biplane Vol: 123.0 ml 59.73 ml/m   AORTA Ao Root diam: 3.30 cm TRICUSPID VALVE TR Peak grad:   45.8 mmHg TR Vmax:        339.00 cm/s  SHUNTS Systemic Diam: 2.00 cm  Kirk Ruths MD  Electronically signed by Kirk Ruths MD Signature Date/Time: 10/18/2019/3:51:08 PM    Final     Labs: BMET Recent Labs  Lab 10/29/2019 1749 10/17/19 1610 10/18/19 0736 10/18/19 1122 10/18/19 1551 10/18/19 2054 10/19/19 0237 10/19/19 1619 10/19/19 1700 10/20/19 0257  NA 142 143 140 139 142 139 142 136  --  134*  K 4.7 3.9 5.7* 5.0 6.1* 6.8* 5.3* 4.3  --  4.7  CL 111 111 109  --  108  --  111 105  --  106  CO2 22 19* 14*  --  15*  --  12* 16*  --  17*  GLUCOSE 156* 137* 105*  --  114*  --  78 151*  --  183*  BUN 46* 45* 60*  --  69*  --  57* 43*  --  45*  CREATININE 2.84* 2.65* 4.17*  --  4.43*  --  3.87* 2.88*  --  2.30*  CALCIUM 8.5* 8.1* 8.7*  --  8.6*  --  7.9* 7.6*  --  7.1*  PHOS  --   --   --   --  6.8*  --  7.1* 5.1* 5.0* 4.1   CBC Recent Labs  Lab 10/17/19 0528 10/18/19 0735 10/18/19 1122 10/18/19 2054 10/19/19 0237 10/20/19 0257  WBC 4.2 6.6  --   --  14.9* 4.2  NEUTROABS  --   --   --   --  13.1*  --   HGB 10.9* 13.5 14.3 15.6 13.3 14.4  HCT 35.5* 44.3 42.0 46.0 43.1 45.0  MCV 80.1 80.0  --   --  80.7 76.9*  PLT 146* 199  --   --  163 124*    Medications:    . amiodarone  200 mg Per Tube Daily  . aspirin  81 mg Per Tube Daily  . B-complex with vitamin C  1 tablet Per Tube Daily  . chlorhexidine gluconate (MEDLINE KIT)  15 mL Mouth Rinse BID  . Chlorhexidine Gluconate Cloth  6 each Topical Daily  . dexamethasone  6 mg Per Tube Daily  . feeding supplement (PRO-STAT SUGAR FREE 64)  60 mL Per Tube TID  . insulin aspart  0-15 Units Subcutaneous Q4H  . loratadine  10 mg Per Tube Daily  . mouth rinse  15 mL Mouth Rinse 10 times per day  . ramelteon  8 mg Oral QHS  . sodium chloride flush  3 mL Intravenous Once   Elmarie Shiley, MD 10/20/2019, 6:57 AM

## 2019-10-20 NOTE — Progress Notes (Signed)
No restraints used thus far during shift. Safety mitts in use.

## 2019-10-20 NOTE — Progress Notes (Signed)
[facility] for Heparin  Indication: atrial fibrillation  Allergies  Allergen Reactions  . 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 Ibuprofen Other (See Comments)    Kidney damage    Patient Measurements: Height: 5\' 11"  (180.3 cm) Weight: 189 lb 9.5 oz (86 kg) IBW/kg (Calculated) : 75.3 Heparin Dosing Weight: 87.5 kg  Vital Signs: Temp: 97.7 F (36.5 C) (12/12 1200) Temp Source: Axillary (12/12 1200) BP: 102/70 (12/12 1400) Pulse Rate: 84 (12/12 1100)  Labs: Recent Labs    10/18/19 0735 10/19/19 0237 10/19/19 0257 10/19/19 1619 10/19/19 1806 10/20/19 0257 10/20/19 0500 10/20/19 0855 10/20/19 0859 10/20/19 1424  HGB   < > 13.3  --   --   --  14.4  --  16.7  --   --   HCT  --  43.1  --   --   --  45.0  --  49.0  --   --   PLT  --  163  --   --   --  124*  --   --  113*  --   APTT   < > 189*  --   --  >200* >200*  --   --  >200*  --   LABPROT  --   --   --   --   --   --   --   --  27.0*  --   INR  --   --   --   --   --   --   --   --  2.5*  --   HEPARINUNFRC  --   --  0.45  --   --   --  0.87*  --   --  0.61  CREATININE  --  3.87*  --  2.88*  --  2.30*  --   --   --   --    < > = values in this interval not displayed.    Estimated Creatinine Clearance: 36.4 mL/min (A) (by C-G formula based on SCr of 2.3 mg/dL (H)).   Assessment: 55 yom that presented to the ED in A-fib w/ RVR. Patient's CHA2DS2-VAsc score is > 2. With this pharmacy has been asked to dose heparin at this time.   Patient was transferred to ICU, Intubated, and placed on CRRT yesterday  Hep lvl have been therapeutic on IV heparin - despite this, issues with clotting of CRRT circuit. We have had these issues w COVID, so fixed rate circuit heparin added. Heparin level 0.61 within range. Goal of Therapy:  Heparin level 0.3-0.7 units/ml Monitor platelets by anticoagulation protocol: Yes   Plan:  Continue Heparin at 1150 units/hr (+ 300  units/hr fixed rate in CRRT) Daily HL CBC  F/u change to [place], PharmD PGY2 Infectious Disease Pharmacy Resident   Please check AMION for all [facility] numbers  10/20/2019 2:52 PM

## 2019-10-21 ENCOUNTER — Inpatient Hospital Stay (HOSPITAL_COMMUNITY): Payer: Medicare HMO

## 2019-10-21 DIAGNOSIS — Z9911 Dependence on respirator [ventilator] status: Secondary | ICD-10-CM

## 2019-10-21 LAB — COMPREHENSIVE METABOLIC PANEL
ALT: 1063 U/L — ABNORMAL HIGH (ref 0–44)
AST: 892 U/L — ABNORMAL HIGH (ref 15–41)
Albumin: 2.5 g/dL — ABNORMAL LOW (ref 3.5–5.0)
Alkaline Phosphatase: 72 U/L (ref 38–126)
Anion gap: 17 — ABNORMAL HIGH (ref 5–15)
BUN: 53 mg/dL — ABNORMAL HIGH (ref 6–20)
CO2: 17 mmol/L — ABNORMAL LOW (ref 22–32)
Calcium: 7.1 mg/dL — ABNORMAL LOW (ref 8.9–10.3)
Chloride: 104 mmol/L (ref 98–111)
Creatinine, Ser: 1.95 mg/dL — ABNORMAL HIGH (ref 0.61–1.24)
GFR calc Af Amer: 42 mL/min — ABNORMAL LOW (ref >60)
GFR calc non Af Amer: 36 mL/min — ABNORMAL LOW (ref >60)
Glucose, Bld: 138 mg/dL — ABNORMAL HIGH (ref 70–99)
Potassium: 4.6 mmol/L (ref 3.5–5.1)
Sodium: 138 mmol/L (ref 135–145)
Total Bilirubin: 11.6 mg/dL — ABNORMAL HIGH (ref 0.3–1.2)
Total Protein: 5.1 g/dL — ABNORMAL LOW (ref 6.5–8.1)

## 2019-10-21 LAB — RENAL FUNCTION PANEL
Albumin: 2.5 g/dL — ABNORMAL LOW (ref 3.5–5.0)
Albumin: 2.3 g/dL — ABNORMAL LOW (ref 3.5–5.0)
Anion gap: 14 (ref 5–15)
Anion gap: 6 (ref 5–15)
BUN: 52 mg/dL — ABNORMAL HIGH (ref 6–20)
BUN: 46 mg/dL — ABNORMAL HIGH (ref 6–20)
CO2: 19 mmol/L — ABNORMAL LOW (ref 22–32)
CO2: 19 mmol/L — ABNORMAL LOW (ref 22–32)
Calcium: 7.2 mg/dL — ABNORMAL LOW (ref 8.9–10.3)
Calcium: 7.2 mg/dL — ABNORMAL LOW (ref 8.9–10.3)
Chloride: 104 mmol/L (ref 98–111)
Chloride: 105 mmol/L (ref 98–111)
Creatinine, Ser: 1.97 mg/dL — ABNORMAL HIGH (ref 0.61–1.24)
Creatinine, Ser: 1.59 mg/dL — ABNORMAL HIGH (ref 0.61–1.24)
GFR calc Af Amer: 42 mL/min — ABNORMAL LOW (ref >60)
GFR calc Af Amer: 54 mL/min — ABNORMAL LOW (ref >60)
GFR calc non Af Amer: 36 mL/min — ABNORMAL LOW (ref >60)
GFR calc non Af Amer: 46 mL/min — ABNORMAL LOW (ref >60)
Glucose, Bld: 136 mg/dL — ABNORMAL HIGH (ref 70–99)
Glucose, Bld: 133 mg/dL — ABNORMAL HIGH (ref 70–99)
Phosphorus: 2.6 mg/dL (ref 2.5–4.6)
Phosphorus: 3.3 mg/dL (ref 2.5–4.6)
Potassium: 4.7 mmol/L (ref 3.5–5.1)
Potassium: 4.8 mmol/L (ref 3.5–5.1)
Sodium: 137 mmol/L (ref 135–145)
Sodium: 130 mmol/L — ABNORMAL LOW (ref 135–145)

## 2019-10-21 LAB — APTT
aPTT: 169 seconds (ref 24–36)
aPTT: 124 seconds — ABNORMAL HIGH (ref 24–36)

## 2019-10-21 LAB — GLUCOSE, CAPILLARY
Glucose-Capillary: 120 mg/dL — ABNORMAL HIGH (ref 70–99)
Glucose-Capillary: 84 mg/dL (ref 70–99)
Glucose-Capillary: 138 mg/dL — ABNORMAL HIGH (ref 70–99)

## 2019-10-21 LAB — POCT I-STAT EG7
Acid-base deficit: 6 mmol/L — ABNORMAL HIGH (ref 0.0–2.0)
Bicarbonate: 21.9 mmol/L (ref 20.0–28.0)
Calcium, Ion: 1.04 mmol/L — ABNORMAL LOW (ref 1.15–1.40)
HCT: 44 % (ref 39.0–52.0)
Hemoglobin: 15 g/dL (ref 13.0–17.0)
O2 Saturation: 65 %
Patient temperature: 35.5
Potassium: 4.3 mmol/L (ref 3.5–5.1)
Sodium: 135 mmol/L (ref 135–145)
TCO2: 23 mmol/L (ref 22–32)
pCO2, Ven: 45.6 mmHg (ref 44.0–60.0)
pH, Ven: 7.281 (ref 7.250–7.430)
pO2, Ven: 35 mmHg (ref 32.0–45.0)

## 2019-10-21 LAB — CBC
HCT: 42.4 % (ref 39.0–52.0)
Hemoglobin: 13.6 g/dL (ref 13.0–17.0)
MCH: 24.5 pg — ABNORMAL LOW (ref 26.0–34.0)
MCHC: 32.1 g/dL (ref 30.0–36.0)
MCV: 76.3 fL — ABNORMAL LOW (ref 80.0–100.0)
Platelets: 90 10*3/uL — ABNORMAL LOW (ref 150–400)
RBC: 5.56 MIL/uL (ref 4.22–5.81)
RDW: 15.5 % (ref 11.5–15.5)
WBC: 3.3 10*3/uL — ABNORMAL LOW (ref 4.0–10.5)
nRBC: 24.5 % — ABNORMAL HIGH (ref 0.0–0.2)

## 2019-10-21 LAB — MAGNESIUM: Magnesium: 2.5 mg/dL — ABNORMAL HIGH (ref 1.7–2.4)

## 2019-10-21 LAB — HEPARIN LEVEL (UNFRACTIONATED): Heparin Unfractionated: 0.3 IU/mL (ref 0.30–0.70)

## 2019-10-21 LAB — PHOSPHORUS: Phosphorus: 2.6 mg/dL (ref 2.5–4.6)

## 2019-10-21 MED ORDER — FENTANYL CITRATE (PF) 100 MCG/2ML IJ SOLN: 50.0000 ug | Freq: Once | INTRAMUSCULAR | Status: DC | PRN

## 2019-10-21 MED ORDER — VASOPRESSIN 20 UNIT/ML IV SOLN
0.0400 [IU]/min | INTRAVENOUS | Status: DC
  Administered 2019-10-21: 0.03 [IU]/min via INTRAVENOUS
  Administered 2019-10-22: 0.04 [IU]/min via INTRAVENOUS
  Filled 2019-10-21 (×3): qty 2

## 2019-10-21 NOTE — Progress Notes (Signed)
Called patient's son Diamonte and informed him that patient's top dentures are in patient's mouth. Son expressed thanks and relief.

## 2019-10-21 NOTE — Progress Notes (Signed)
ANTICOAGULATION CONSULT NOTE  Pharmacy Consult for Heparin  Indication: atrial fibrillation  Patient Measurements: Height: 5\' 11"  (180.3 cm) Weight: 188 lb 0.8 oz (85.3 kg) IBW/kg (Calculated) : 75.3 Heparin Dosing Weight: 87.5 kg  Vital Signs: Temp: 94.5 F (34.7 C) (12/13 0800) Temp Source: Axillary (12/13 0800) BP: 86/56 (12/13 1000) Pulse Rate: 53 (12/13 0930)  Labs: Recent Labs    10/19/19 0257 10/19/19 1619 10/20/19 0257 10/20/19 0500 10/20/19 0855 10/20/19 0859 10/20/19 1424 10/21/19 0631 10/21/19 0632  HGB   < >  --  14.4  --  16.7  --   --   --  13.6  HCT  --   --  45.0  --  49.0  --   --   --  42.4  PLT  --   --  124*  --   --  113*  --   --  90*  APTT  --   --  >200*  --   --  >200*  --   --  169*  LABPROT  --   --   --   --   --  27.0*  --   --   --   INR  --   --   --   --   --  2.5*  --   --   --   HEPARINUNFRC  --   --   --  0.87*  --   --  0.61 0.30  --   CREATININE  --  2.88* 2.30*  --   --   --   --   --  1.95*  1.97*   < > = values in this interval not displayed.     Assessment: 50 yom that presented to the ED in A-fib w/ RVR. Patient's CHA2DS2-VAsc score is > 2.   Patient was transferred to ICU, Intubated, and placed on CRRT.  Heparin level is therapeutic, aPTT is supratherapeutic. Pt is not bleeding. Previous notes state the CRRT circuit was clotting despite systemic heparin. Currently there are no issues with the machine nor patient - will leave heparin as is.   Goal of Therapy:  Heparin level 0.3-0.7 units/ml Monitor platelets by anticoagulation protocol: Yes    Plan:  Continue Heparin at 1150 units/hr Patient also has 300 units/hr fixed rate running through CRRT Daily HL CBC     Harvel Quale 10/21/2019 11:05 AM

## 2019-10-21 NOTE — Progress Notes (Addendum)
PULMONARY / CRITICAL CARE MEDICINE   NAME:  Paul [name], MRN:  [id number], DOB:  1961-06-24, LOS: 5 ADMISSION DATE:  10/10/2019, CONSULTATION DATE: 10/18/2019 REFERRING MD: Teaching service, CHIEF COMPLAINT: Hypoxic respiratory failure  BRIEF HISTORY:    58 year old male smoker, biventricular failure, high output cardiomyopathy admitted 12/8 with increased SOB.  Found to be in [facility] and worsening hypoxia.  COVID-19 positive on admit. He continued to decline and was transferred to the ICU on 10/18/2019 and urgently intubated.  He has known biventricular failure has been treated with Lasix.  Treated with amiodarone drip and heparin drip for AF.  Cardiology consulted for elevated troponin. Nephrology consulted for AKI on CKD.   SIGNIFICANT PAST MEDICAL HISTORY   Biventricular failure COVID Tobacco abuse Atrial fibrillation Chronic kidney disease Degenerative joint disease with hip replacement Chronic pain  SIGNIFICANT EVENTS:  12/08 Admit  12/10 Tx to ICU, intubated  STUDIES:   12/10 ECHO >> LVEF 20%, severe RV hypokinesis   CULTURES:  12/09 COVID >> positive 12/10 Sputum >> not sent 12/10 BCx2 >> not sent  ANTIBIOTICS / COVID:  Remdesivir 12/9 >> 12/11 Decadron 12/9 >>   LINES/TUBES:  ETT 12/10 >>  R Fem Aline 12/10 >>  R IJ HD 12/10 >>   CONSULTANTS:  12/10 PCCM  SUBJECTIVE:  Afebrile.   CVVHD continues, goal net even On levophed 14 mcg Precedex 1.26mcg Pt on PSV 12, 40% On PRVC > Peak 25, Pplat 22  CONSTITUTIONAL: BP 101/75   Pulse 82   Temp (!) 96.9 F (36.1 C) (Axillary)   Resp (!) 24   Ht $R'5\' 11"'jy$  (1.803 m)   Wt 85.3 kg   SpO2 100%   BMI 26.23 kg/m   I/O last 3 completed shifts: In: 4170.5 [I.V.:2783; NG/GT:1387.5] Out: 5465 [Other:4057]     Vent Mode: PRVC FiO2 (%):  [40 %] 40 % Set Rate:  [24 bmp] 24 bmp Vt Set:  [600 mL] 600 mL PEEP:  [5 cmH20] 5 cmH20 Plateau Pressure:  [16 cmH20-22 cmH20] 22 cmH20  PHYSICAL EXAM: General:  critically ill appearing adult male lying in bed in NAD on vent  HEENT: MM pink/moist, ETT, R IJ HD cath c/d/i  Neuro: sedate, will arouse and move all extremities  CV: s1s2 irr irr, AF on monitor, no m/r/g PULM:  Non-labored on PSV, lungs bilaterally clear GI: soft, bsx4 active Extremities: warm/dry, trace edema / appears euvolemic  Skin: no rashes or lesions  CXR 12/13 >> images personally reviewed, ETT in good position, small if any effusion, mild R>L airspace disease  RESOLVED PROBLEM LIST    ASSESSMENT AND PLAN    Acute hypoxemic respiratory failure in setting of COVID-19 Did not tolerate remdesivir due to elevated LFT's -low Vt ventilation 4-8cc/kg -goal plateau pressure <30, driving pressure <03 cm H2O -target PaO2 55-65, titrate PEEP/FiO2 per ARDS protocol  -goal CVP <4, volume status per HD -VAP prevention measures  -follow intermittent CXR  -continue decadron, D5  Acute on chronic biventricular failure - large RV component likely related to COVID infection Cardiogenic shock -wean levophed for MAP >65  -add vasopressin gtt   -assess SvO2  Reactive Afib  -tele monitoring  -discontinue amiodarone given LFT's   Acute on chronic renal failure - on CRRT, switching to neutral pull as appears more euvolemic -appreciate Nephrology input -continue CVVHD -trend BMP -monitor, replace electrolytes as indicated   Congestive hepatic failure - related to acute heart failure, cannot rule out superimosed COVID and/or remdesivir effect -trend  LFT's  -avoid hepatotoxic agents  -hold further remdesivir with transaminitis  -stop amiodarone with elevated LFT's  Dropping platelets, elevated PTT  DIC panel completed 12/12, unclear clinical significance given ongoing HD and impact of COVID  -monitor for now   Daily Goals Checklist  Pain/Anxiety/Delirium protocol (if indicated): fentanyl and precedex infusions  VAP protocol (if indicated): bundle in place DVT prophylaxis:  systemic IV heparin for AF Nutritional status and feeding goals: TF GI prophylaxis: Famotidine Glucose control: SSI Mobility/therapy needs: bedrest Dispo: ICU   LABS  Glucose Recent Labs  Lab 10/20/19 0307 10/20/19 0805 10/20/19 1112 10/20/19 1927 10/20/19 2341 10/21/19 0401  GLUCAP 169* 147* 135* 115* 105* 120*    BMET Recent Labs  Lab 10/19/19 0237 10/19/19 1619 10/20/19 0257 10/20/19 0855  NA 142 136 134* 135  K 5.3* 4.3 4.7 4.5  CL 111 105 106  --   CO2 12* 16* 17*  --   BUN 57* 43* 45*  --   CREATININE 3.87* 2.88* 2.30*  --   GLUCOSE 78 151* 183*  --     Liver Enzymes Recent Labs  Lab 10/18/19 0736 10/19/19 0237 10/19/19 1619 10/20/19 0257  AST 76* 611*  --  1,146*  ALT 56* 433*  --  1,044*  ALKPHOS 88 85  --  83  BILITOT 2.7* 3.5*  --  4.5*  ALBUMIN 3.2* 2.7* 2.6* 2.4*    Electrolytes Recent Labs  Lab 10/19/19 0237 10/19/19 1619 10/19/19 1700 10/20/19 0257  CALCIUM 7.9* 7.6*  --  7.1*  MG 2.2  --  2.3 2.2  PHOS 7.1* 5.1* 5.0* 4.1    CBC Recent Labs  Lab 10/18/19 0735 10/19/19 0237 10/20/19 0257 10/20/19 0855 10/20/19 0859  WBC 6.6 14.9* 4.2  --   --   HGB 13.5 13.3 14.4 16.7  --   HCT 44.3 43.1 45.0 49.0  --   PLT 199 163 124*  --  113*    ABG Recent Labs  Lab 10/18/19 1122 10/18/19 2054 10/20/19 0855  PHART 7.252* 7.204* 7.346*  PCO2ART 40.2 23.6* 34.0  PO2ART 424.0* 301.0* 121.0*    Coag's Recent Labs  Lab 10/19/19 1806 10/20/19 0257 10/20/19 0859  APTT >200* >200* >200*  INR  --   --  2.5*    Sepsis Markers Recent Labs  Lab 10/20/19 0859  LATICACIDVEN 3.3*    Cardiac Enzymes No results for input(s): TROPONINI, PROBNP in the last 168 hours.   BNP (last 3 results) Recent Labs    10/17/19 0528  BNP 2,601.7*    ProBNP (last 3 results) Recent Labs    07/30/19 1620  PROBNP 9,273*     CC Time: 30 minutes   Noe Gens, MSN, NP-C [facility] 10/21/2019, 7:16  AM   Please see [email or web address] for pager details.    Pulmonary critical care attending:  58 year old gentleman biventricular heart failure cardiomyopathy admitted for COVID-19, atrial fibrillation with RVR.  Was intubated for acute hypoxemic respiratory failure developed worsening renal failure and decision was made for CVVHD.  Patient was transferred yesterday from the medical ICU to [facility].  BP 94/81   Pulse 87   Temp (!) 95.4 F (35.2 C) (Oral)   Resp 20   Ht $R'5\' 11"'HI$  (1.803 m)   Wt 85.3 kg   SpO2 100%   BMI 26.23 kg/m   General: Middle-aged male intubated on mechanical life support critically ill HEENT: Endotracheal tube in place Heart: Distant heart tones, S1-S2, irregular, rate  controlled Lungs: Bilateral ventilated breath sounds Abdomen: Soft non distended Extremities: No significant edema  Labs: Reviewed, AST ALT improving Chest x-ray: Bibasilar opacities likely small pleural effusions. The patient's images have been independently reviewed by me.    Assessment: Acute hypoxemic respiratory failure secondary to COVID-19 pneumonia, bilateral pulmonary edema, pleural effusions in the setting of cardiogenic shock with acute on chronic biventricular systolic heart failure Atrial fibrillation Acute on chronic renal failure Congestive hepatopathy, acute hepatic injury  Plan: Patient remains on full mechanical support at this time Vasopressors, norepinephrine titrated to maintain mean arterial pressure greater than 65 mmHg CVVHD for volume removal Appreciate nephrology input. Holding nephro toxic and hepatotoxic drugs.  Unable to complete remdesivir due to transaminitis, stopped amiodarone. Repeat labs in AM  This patient is critically ill with multiple organ system failure; which, requires frequent high complexity decision making, assessment, support, evaluation, and titration of therapies. This was completed through the application of advanced monitoring technologies and extensive  interpretation of multiple databases. During this encounter critical care time was devoted to patient care services described in this note for 32 minutes.  Garner Nash, DO [facility] 10/21/2019 3:49 PM

## 2019-10-21 NOTE — Progress Notes (Signed)
eLink Physician-Brief Progress Note Patient Name: Paul [name] DOB: 1961-02-24 MRN: [id number]   Date of Service  10/21/2019  HPI/Events of Note  Request for prn Fentanyl. Unable to see patient now as camera is in another room but reportedly with episodes of agitation and being combative during nursing care and would desaturate. CRRT ongoing.  eICU Interventions  Ordered Fentanyl 50 mcg once prn for agitation     Intervention Category Minor Interventions: Agitation / anxiety - evaluation and management  Judd Lien 10/21/2019, 10:00 PM

## 2019-10-21 NOTE — Progress Notes (Signed)
Patient's some Paul [name] called for update. Son expressed frustration that he did not receive call from sending unit as he requested when patient was transferred to [facility] yesterday.  This RN apologized and looked through personal items for patient's dentures which were not found. Informed son that we will call Paul [name] to attempt to locate dentures. All questions answered and son thanked me.

## 2019-10-22 ENCOUNTER — Inpatient Hospital Stay (HOSPITAL_COMMUNITY): Payer: Medicare HMO

## 2019-10-22 DIAGNOSIS — N184 Chronic kidney disease, stage 4 (severe): Secondary | ICD-10-CM

## 2019-10-22 DIAGNOSIS — N179 Acute kidney failure, unspecified: Secondary | ICD-10-CM

## 2019-10-22 DIAGNOSIS — R579 Shock, unspecified: Secondary | ICD-10-CM

## 2019-10-22 DIAGNOSIS — Z452 Encounter for adjustment and management of vascular access device: Secondary | ICD-10-CM

## 2019-10-22 DIAGNOSIS — I4891 Unspecified atrial fibrillation: Secondary | ICD-10-CM

## 2019-10-22 LAB — GLUCOSE, CAPILLARY
Glucose-Capillary: 115 mg/dL — ABNORMAL HIGH (ref 70–99)
Glucose-Capillary: 117 mg/dL — ABNORMAL HIGH (ref 70–99)
Glucose-Capillary: 83 mg/dL (ref 70–99)
Glucose-Capillary: 39 mg/dL — CL (ref 70–99)
Glucose-Capillary: 147 mg/dL — ABNORMAL HIGH (ref 70–99)
Glucose-Capillary: 63 mg/dL — ABNORMAL LOW (ref 70–99)
Glucose-Capillary: 22 mg/dL — CL (ref 70–99)
Glucose-Capillary: 158 mg/dL — ABNORMAL HIGH (ref 70–99)
Glucose-Capillary: 162 mg/dL — ABNORMAL HIGH (ref 70–99)
Glucose-Capillary: 212 mg/dL — ABNORMAL HIGH (ref 70–99)
Glucose-Capillary: 101 mg/dL — ABNORMAL HIGH (ref 70–99)
Glucose-Capillary: 114 mg/dL — ABNORMAL HIGH (ref 70–99)

## 2019-10-22 LAB — POCT I-STAT 7, (LYTES, BLD GAS, ICA,H+H)
Acid-base deficit: 10 mmol/L — ABNORMAL HIGH (ref 0.0–2.0)
Bicarbonate: 16.3 mmol/L — ABNORMAL LOW (ref 20.0–28.0)
Calcium, Ion: 0.98 mmol/L — ABNORMAL LOW (ref 1.15–1.40)
HCT: 45 % (ref 39.0–52.0)
Hemoglobin: 15.3 g/dL (ref 13.0–17.0)
O2 Saturation: 89 %
Patient temperature: 98.6
Potassium: 4.7 mmol/L (ref 3.5–5.1)
Sodium: 136 mmol/L (ref 135–145)
TCO2: 17 mmol/L — ABNORMAL LOW (ref 22–32)
pCO2 arterial: 35.1 mmHg (ref 32.0–48.0)
pH, Arterial: 7.273 — ABNORMAL LOW (ref 7.350–7.450)
pO2, Arterial: 63 mmHg — ABNORMAL LOW (ref 83.0–108.0)

## 2019-10-22 LAB — POCT I-STAT EG7
Acid-base deficit: 20 mmol/L — ABNORMAL HIGH (ref 0.0–2.0)
Bicarbonate: 12.4 mmol/L — ABNORMAL LOW (ref 20.0–28.0)
Calcium, Ion: 0.93 mmol/L — ABNORMAL LOW (ref 1.15–1.40)
HCT: 46 % (ref 39.0–52.0)
Hemoglobin: 15.6 g/dL (ref 13.0–17.0)
O2 Saturation: 49 %
Patient temperature: 98.5
Potassium: 5.4 mmol/L — ABNORMAL HIGH (ref 3.5–5.1)
Sodium: 137 mmol/L (ref 135–145)
TCO2: 14 mmol/L — ABNORMAL LOW (ref 22–32)
pCO2, Ven: 52.6 mmHg (ref 44.0–60.0)
pH, Ven: 6.978 — CL (ref 7.250–7.430)
pO2, Ven: 41 mmHg (ref 32.0–45.0)

## 2019-10-22 LAB — COMPREHENSIVE METABOLIC PANEL
ALT: 929 U/L — ABNORMAL HIGH (ref 0–44)
AST: 564 U/L — ABNORMAL HIGH (ref 15–41)
Albumin: 2.1 g/dL — ABNORMAL LOW (ref 3.5–5.0)
Alkaline Phosphatase: 63 U/L (ref 38–126)
Anion gap: 16 — ABNORMAL HIGH (ref 5–15)
BUN: 43 mg/dL — ABNORMAL HIGH (ref 6–20)
CO2: 17 mmol/L — ABNORMAL LOW (ref 22–32)
Calcium: 7.4 mg/dL — ABNORMAL LOW (ref 8.9–10.3)
Chloride: 104 mmol/L (ref 98–111)
Creatinine, Ser: 1.45 mg/dL — ABNORMAL HIGH (ref 0.61–1.24)
GFR calc Af Amer: >60 mL/min (ref >60)
GFR calc non Af Amer: 52 mL/min — ABNORMAL LOW (ref >60)
Glucose, Bld: 81 mg/dL (ref 70–99)
Potassium: 4.7 mmol/L (ref 3.5–5.1)
Sodium: 137 mmol/L (ref 135–145)
Total Bilirubin: 19.6 mg/dL (ref 0.3–1.2)
Total Protein: 5.3 g/dL — ABNORMAL LOW (ref 6.5–8.1)

## 2019-10-22 LAB — RENAL FUNCTION PANEL
Albumin: 2.1 g/dL — ABNORMAL LOW (ref 3.5–5.0)
Anion gap: 17 — ABNORMAL HIGH (ref 5–15)
BUN: 42 mg/dL — ABNORMAL HIGH (ref 6–20)
CO2: 16 mmol/L — ABNORMAL LOW (ref 22–32)
Calcium: 7.4 mg/dL — ABNORMAL LOW (ref 8.9–10.3)
Chloride: 104 mmol/L (ref 98–111)
Creatinine, Ser: 1.42 mg/dL — ABNORMAL HIGH (ref 0.61–1.24)
GFR calc Af Amer: >60 mL/min (ref >60)
GFR calc non Af Amer: 53 mL/min — ABNORMAL LOW (ref >60)
Glucose, Bld: 80 mg/dL (ref 70–99)
Phosphorus: 2.9 mg/dL (ref 2.5–4.6)
Potassium: 4.7 mmol/L (ref 3.5–5.1)
Sodium: 137 mmol/L (ref 135–145)

## 2019-10-22 LAB — CBC
HCT: 42.7 % (ref 39.0–52.0)
Hemoglobin: 13.1 g/dL (ref 13.0–17.0)
MCH: 24.1 pg — ABNORMAL LOW (ref 26.0–34.0)
MCHC: 30.7 g/dL (ref 30.0–36.0)
MCV: 78.5 fL — ABNORMAL LOW (ref 80.0–100.0)
Platelets: 40 10*3/uL — ABNORMAL LOW (ref 150–400)
RBC: 5.44 MIL/uL (ref 4.22–5.81)
RDW: 16.2 % — ABNORMAL HIGH (ref 11.5–15.5)
WBC: 2.1 10*3/uL — ABNORMAL LOW (ref 4.0–10.5)
nRBC: 266.8 % — ABNORMAL HIGH (ref 0.0–0.2)

## 2019-10-22 LAB — APTT: aPTT: 132 seconds — ABNORMAL HIGH (ref 24–36)

## 2019-10-22 LAB — LACTIC ACID, PLASMA: Lactic Acid, Venous: >11 mmol/L (ref 0.5–1.9)

## 2019-10-22 LAB — HEPARIN LEVEL (UNFRACTIONATED): Heparin Unfractionated: 0.21 IU/mL — ABNORMAL LOW (ref 0.30–0.70)

## 2019-10-22 MED ORDER — SODIUM BICARBONATE-DEXTROSE 150-5 MEQ/L-% IV SOLN
150.0000 meq | INTRAVENOUS | Status: DC
  Administered 2019-10-22: 150 meq via INTRAVENOUS
  Filled 2019-10-22 (×2): qty 1000

## 2019-10-22 MED ORDER — SODIUM BICARBONATE 8.4 % IV SOLN
INTRAVENOUS | Status: AC
  Administered 2019-10-22: 100 meq via INTRAVENOUS
  Filled 2019-10-22: qty 100

## 2019-10-22 MED ORDER — MIDAZOLAM BOLUS VIA INFUSION
1.0000 mg | INTRAVENOUS | Status: DC | PRN
  Filled 2019-10-22: qty 2

## 2019-10-22 MED ORDER — MORPHINE SULFATE (PF) 2 MG/ML IV SOLN
4.0000 mg | INTRAVENOUS | Status: DC
  Filled 2019-10-22: qty 2

## 2019-10-22 MED ORDER — EPINEPHRINE HCL 5 MG/250ML IV SOLN IN NS
0.5000 ug/min | INTRAVENOUS | Status: DC
  Administered 2019-10-22: 0.5 ug/min via INTRAVENOUS
  Filled 2019-10-22: qty 250

## 2019-10-22 MED ORDER — SODIUM BICARBONATE 8.4 % IV SOLN: 100.0000 meq | Freq: Once | INTRAVENOUS | Status: AC

## 2019-10-22 MED ORDER — DEXTROSE 10 % IV SOLN
INTRAVENOUS | Status: DC
  Administered 2019-10-22: 14:00:00 via INTRAVENOUS

## 2019-10-22 MED ORDER — SODIUM CHLORIDE 0.9 % IV SOLN
8.0000 mg/h | INTRAVENOUS | Status: DC
  Administered 2019-10-22: 8 mg/h via INTRAVENOUS
  Filled 2019-10-22 (×2): qty 80

## 2019-10-22 MED ORDER — SODIUM BICARBONATE 8.4 % IV SOLN
INTRAVENOUS | Status: AC
  Administered 2019-10-22: 50 meq
  Filled 2019-10-22: qty 50

## 2019-10-22 MED ORDER — SODIUM CHLORIDE 0.9 % IV SOLN
0.0500 mg/kg/h | INTRAVENOUS | Status: DC
  Filled 2019-10-22 (×2): qty 250

## 2019-10-22 MED ORDER — DOBUTAMINE IN D5W 4-5 MG/ML-% IV SOLN
2.5000 ug/kg/min | INTRAVENOUS | Status: DC
  Administered 2019-10-22: 5 ug/kg/min via INTRAVENOUS
  Filled 2019-10-22: qty 250

## 2019-10-22 MED ORDER — SODIUM CHLORIDE 0.9 % IV BOLUS
500.0000 mL | Freq: Once | INTRAVENOUS | Status: AC
  Administered 2019-10-22: 500 mL via INTRAVENOUS

## 2019-10-22 MED ORDER — MIDAZOLAM 50MG/50ML (1MG/ML) PREMIX INFUSION
0.0000 mg/h | INTRAVENOUS | Status: DC
  Filled 2019-10-22: qty 50

## 2019-10-22 MED ORDER — SODIUM CHLORIDE 0.9 % IV SOLN
80.0000 mg | Freq: Once | INTRAVENOUS | Status: DC
  Filled 2019-10-22: qty 80

## 2019-10-22 MED ORDER — PANTOPRAZOLE SODIUM 40 MG IV SOLR: 40.0000 mg | Freq: Two times a day (BID) | INTRAVENOUS | Status: DC

## 2019-10-22 MED ORDER — DEXTROSE 50 % IV SOLN
INTRAVENOUS | Status: AC
  Administered 2019-10-22: 100 mL
  Filled 2019-10-22: qty 100

## 2019-10-22 MED ORDER — DEXTROSE 50 % IV SOLN
INTRAVENOUS | Status: AC
  Administered 2019-10-22: 50 mL
  Filled 2019-10-22: qty 50

## 2019-10-22 MED ORDER — ALBUMIN HUMAN 25 % IV SOLN
12.5000 g | Freq: Once | INTRAVENOUS | Status: AC
  Administered 2019-10-22: 12.5 g via INTRAVENOUS
  Filled 2019-10-22: qty 50

## 2019-10-22 MED ORDER — EPINEPHRINE PF 1 MG/ML IJ SOLN
0.5000 ug/min | INTRAVENOUS | Status: DC
  Filled 2019-10-22: qty 8

## 2019-10-23 LAB — PATHOLOGIST SMEAR REVIEW

## 2019-10-31 ENCOUNTER — Ambulatory Visit: Payer: Medicare HMO | Admitting: Family Medicine

## 2019-11-09 NOTE — Progress Notes (Signed)
Returned call to patient's son Libero and no answer, left message.

## 2019-11-09 NOTE — Progress Notes (Signed)
Ventilator turned off per Dr. Lake Bells request.

## 2019-11-09 NOTE — Death Summary Note (Signed)
DEATH SUMMARY   Patient Details  Name: Paul [name] MRN: [id number] DOB: 1961-08-27  Admission/Discharge Information   Admit Date:  2019/11/05  Date of Death: Date of Death: Nov 11, 2019  Time of Death: Time of Death: 1526/04/10  Length of Stay: 6  Referring Physician: [name], [place], DO   Reason(s) for Hospitalization  COVID 04-16-2023  Diagnoses  Preliminary cause of death:   COVID 04-16-23 Secondary Diagnoses (including complications and co-morbidities):  Principal Problem:   Biventricular heart failure (HCC) Active Problems:   Hyperlipidemia   Microcytic anemia   Hypomagnesemia   Acute respiratory failure with hypoxia (HCC)   Chronic atrial fibrillation (HCC)   Atrial fibrillation with RVR (HCC)   Hyperbilirubinemia   Hypoalbuminemia   Hypoproteinemia (HCC)   Thrombocytopenia (HCC)   History of Abdominal ascites   Thrombosed external hemorrhoid   Acute on chronic kidney failure (HCC)   COVID-19 virus infection   Shock circulatory (HCC)   Encounter for central line placement   Brief Hospital Course (including significant findings, care, treatment, and services provided and events leading to death)  Paul [name] is a 59 y.o. year old male smoker, biventricular failure, high output cardiomyopathy admitted 2023/11/05 with increased SOB.  Found to be in [facility] and worsening hypoxia.  COVID-19 positive on admit. He continued to decline and was transferred to the ICU on 10/18/2019 and urgently intubated.  He has known biventricular failure has been treated with Lasix.  Treated with amiodarone drip and heparin drip for AF.  Cardiology consulted for elevated troponin. Nephrology consulted for AKI on CKD.  He was moved to the ICU on 12/10, intubated.  He developed profound shock through the following 2-3 days, ultimately started on CVVHD.  By the morning of 2023-11-11 he was severely acidemic, hypotensive.  His lactic acid was > 11.  We administered escalating doses of vasopressors but ultimately he  did not survive.  He had a cardiac arrest and in the context of his severe underlying multiple acute organ failures we stopped resuscitation efforts and he passed away.   Pertinent Labs and Studies  Significant Diagnostic Studies DG Chest 2 View  Result Date: Nov 05, 2019 CLINICAL DATA:  Acute chest pain and shortness of breath. EXAM: CHEST - 2 VIEW COMPARISON:  02/13/2018 and prior studies FINDINGS: Cardiomegaly, pulmonary vascular congestion and mild interstitial opacities noted. There is no evidence of focal airspace disease, suspicious pulmonary nodule/mass, pleural effusion, or pneumothorax. No acute bony abnormalities are identified. IMPRESSION: 1. Cardiomegaly with pulmonary vascular congestion and probable mild interstitial edema. 2. No evidence of focal pneumonia. Electronically Signed   By: Harmon Pier M.D.   On: 11/05/2019 18:23   DG CHEST PORT 1 VIEW  Result Date: 11/11/19 CLINICAL DATA:  Central line placement. COVID-19 infection. EXAM: PORTABLE CHEST 1 VIEW COMPARISON:  Radiographs 10/21/2019 and 10/19/2019. FINDINGS: 1125 hours. Patient is rotated to the right. Tip of the endotracheal tube is unchanged at the level of the mid trachea. There is a new left IJ central venous catheter which projects to the level of the mid SVC. Right IJ central venous catheter is unchanged at a similar level. Nasogastric tube projects below the diaphragm. There is no pneumothorax. Bilateral pleural effusions and bibasilar pulmonary opacities have not significantly changed. The heart remains moderately enlarged. Evidence of chronic bilateral rotator cuff tears. IMPRESSION: 1. New left IJ central venous catheter placement without demonstrated complication. 2. No significant change in bilateral pleural effusions and bibasilar pulmonary opacities. Electronically Signed   By: Chrissie Noa  Lin Landsman M.D.   On: 11-15-19 11:48   DG Chest Port 1 View  Result Date: 10/21/2019 CLINICAL DATA:  Endotracheal tube placement.  EXAM: PORTABLE CHEST 1 VIEW COMPARISON:  October 19, 2019. FINDINGS: Stable cardiomegaly. Endotracheal and nasogastric tubes are unchanged in position. Right internal jugular catheter is in good position. No pneumothorax is noted. Increased bibasilar atelectasis or infiltrates are noted with associated pleural effusions. Bony thorax is unremarkable. IMPRESSION: Stable support apparatus. Increased bibasilar opacities are noted as described above. Electronically Signed   By: Marijo Conception M.D.   On: 10/21/2019 12:25   AM DG Chest Port 1 View  Result Date: 10/19/2019 CLINICAL DATA:  59 year old male with COVID-19. N STEMI. EXAM: PORTABLE CHEST 1 VIEW COMPARISON:  10/18/2019 portable chest and earlier. FINDINGS: Portable AP semi upright view at 0501 hours. Stable endotracheal tube at the level the clavicles. Stable enteric tube coiled in the left upper quadrant. Stable right IJ central line. Stable cardiomegaly with decreasing retrocardiac and left lung ventilation. Stable mediastinal contours. Stable pulmonary vascularity, no overt edema. No pneumothorax. Small if any pleural effusions. IMPRESSION: 1. Stable lines and tubes. 2. Increasing lower lobe collapse or consolidation since yesterday. 3. Stable cardiomegaly and pulmonary vascular congestion without overt edema. Small if any pleural effusions. Electronically Signed   By: Genevie Ann M.D.   On: 10/19/2019 07:55   DG CHEST PORT 1 VIEW  Result Date: 10/18/2019 CLINICAL DATA:  Central line placement EXAM: PORTABLE CHEST 1 VIEW COMPARISON:  10/18/2019 FINDINGS: Heart size is significantly enlarged. The enteric tube projects over the gastric body. There is a new right-sided central venous catheter with tip terminating over the SVC. There is no right-sided pneumothorax. Diffuse bilateral hazy airspace opacities are again noted. There are likely small bilateral pleural effusions. There is no acute osseous abnormality. The endotracheal tube terminates above the  carina. IMPRESSION: 1. Well-positioned right-sided central venous catheter without evidence for pneumothorax. 2. Remaining lines and tubes as above. 3. Otherwise, no significant interval change. Electronically Signed   By: Constance Holster M.D.   On: 10/18/2019 19:14   DG Chest Port 1 View  Result Date: 10/18/2019 CLINICAL DATA:  Intubation. Respiratory failure. COVID-19 positive. EXAM: PORTABLE CHEST 1 VIEW COMPARISON:  Earlier film, same date. FINDINGS: The endotracheal tube is 7 cm above the carina. There is a NG tube coursing down the esophagus and into the stomach. Stable cardiac enlargement. Persistent patchy bilateral lung infiltrates but overall improved aeration after intubation. IMPRESSION: Endotracheal tube and NG tubes in good position without complicating features. Slight improved lung aeration following intubation. Persistent patchy bilateral lung infiltrates. Electronically Signed   By: Marijo Sanes M.D.   On: 10/18/2019 12:49   DG Chest Port 1 View  Result Date: 10/18/2019 CLINICAL DATA:  Increased shortness of breath.  COVID-19 EXAM: PORTABLE CHEST 1 VIEW COMPARISON:  Radiograph 11/07/2019 FINDINGS: Unchanged cardiomegaly. Unchanged mediastinal contours. Development of hazy multifocal opacities throughout both lungs, more prominent on the right. Probable underlying pulmonary edema. No definite pleural effusion. No pneumothorax. IMPRESSION: 1. Development of hazy multifocal opacities throughout both lungs, more prominent on the right, suspicious for multifocal pneumonia. 2. Unchanged cardiomegaly. Probable pulmonary edema. Electronically Signed   By: Keith Rake M.D.   On: 10/18/2019 01:06   DG Abd Portable 1V  Result Date: 10/18/2019 CLINICAL DATA:  NG tube placement. EXAM: PORTABLE ABDOMEN - 1 VIEW COMPARISON:  02/10/2018 FINDINGS: The NG tube is in the fundal region the stomach. Unremarkable bowel gas pattern. Scattered  air in the small bowel and colon. IMPRESSION: NG tube  is in the fundal region of the stomach. Electronically Signed   By: Marijo Sanes M.D.   On: 10/18/2019 12:51   ECHOCARDIOGRAM COMPLETE  Result Date: 10/18/2019   ECHOCARDIOGRAM REPORT   Patient Name:   Paul [name] Date of Exam: 10/18/2019 Medical Rec #:  [id number]         Height:       71.0 in Accession #:    [id number]        Weight:       189.2 lb Date of Birth:  1961/02/22         BSA:          2.06 m Patient Age:    86 years          BP:           88/72 mmHg Patient Gender: M                 HR:           87 bpm. Exam Location:  Inpatient Procedure: 2D Echo Indications:    congestive heart failure 428.0  History:        Patient has prior history of Echocardiogram examinations, most                 recent 08/14/2019. Chronic kidney disease. Covid.,                 Arrythmias:Atrial Fibrillation; Risk Factors:Current Smoker.  Sonographer:    Johny Chess Referring Phys: [id number] TODD D MCDIARMID  Sonographer Comments: Echo performed with patient supine and on artificial respirator. IMPRESSIONS  1. Left ventricular ejection fraction, by visual estimation, is 20 to 25%. The left ventricle has severely decreased function. There is mildly increased left ventricular hypertrophy.  2. Left ventricular diastolic function could not be evaluated.  3. Moderately dilated left ventricular internal cavity size.  4. The left ventricle demonstrates global hypokinesis.  5. Global right ventricle has severely reduced systolic function.The right ventricular size is normal.  6. Left atrial size was severely dilated.  7. Right atrial size was mildly dilated.  8. Moderate pleural effusion.  9. Trivial pericardial effusion is present. 10. Moderate thickening of the mitral valve leaflet(s). 11. The mitral valve is normal in structure. Mild mitral valve regurgitation. No evidence of mitral stenosis. 12. The tricuspid valve is normal in structure. Tricuspid valve regurgitation is mild. 13. The aortic valve is normal in  structure. Aortic valve regurgitation is not visualized. No evidence of aortic valve sclerosis or stenosis. 14. The pulmonic valve was normal in structure. Pulmonic valve regurgitation is not visualized. 15. Moderately elevated pulmonary artery systolic pressure. 16. The inferior vena cava is dilated in size with <50% respiratory variability, suggesting right atrial pressure of 15 mmHg. 17. Severe global reduction in LV function; moderate LVE; mild LVH; biatrial enlargement; severe RV dysfunction; oscillating density in right atrium likely chiari network; mild MR; mild TR; moderate pulmonary hypertension. FINDINGS  Left Ventricle: Left ventricular ejection fraction, by visual estimation, is 20 to 25%. The left ventricle has severely decreased function. The left ventricle demonstrates global hypokinesis. The left ventricular internal cavity size was moderately dilated left ventricle. There is mildly increased left ventricular hypertrophy. The left ventricular diastology could not be evaluated due to atrial fibrillation. Left ventricular diastolic function could not be evaluated. Normal left atrial pressure. Right Ventricle: The right ventricular size is normal.Global RV systolic function  is has severely reduced systolic function. The tricuspid regurgitant velocity is 3.38 m/s, and with an assumed right atrial pressure of 15 mmHg, the estimated right ventricular systolic pressure is moderately elevated at 60.8 mmHg. Left Atrium: Left atrial size was severely dilated. Right Atrium: Right atrial size was mildly dilated Pericardium: Trivial pericardial effusion is present. There is a moderate pleural effusion in either the left or right lateral region. Mitral Valve: The mitral valve is normal in structure. There is moderate thickening of the mitral valve leaflet(s). Mild mitral valve regurgitation. No evidence of mitral valve stenosis by observation. Tricuspid Valve: The tricuspid valve is normal in structure. Tricuspid  valve regurgitation is mild. Aortic Valve: The aortic valve is normal in structure. Aortic valve regurgitation is not visualized. The aortic valve is structurally normal, with no evidence of sclerosis or stenosis. Pulmonic Valve: The pulmonic valve was normal in structure. Pulmonic valve regurgitation is not visualized. Pulmonic regurgitation is not visualized. Aorta: The aortic root is normal in size and structure. Venous: The inferior vena cava is dilated in size with less than 50% respiratory variability, suggesting right atrial pressure of 15 mmHg.  Additional Comments: Severe global reduction in LV function; moderate LVE; mild LVH; biatrial enlargement; severe RV dysfunction; oscillating density in right atrium likely chiari network; mild MR; mild TR; moderate pulmonary hypertension.  LEFT VENTRICLE PLAX 2D LVIDd:         6.10 cm LVIDs:         5.30 cm LV PW:         1.30 cm LV IVS:        1.30 cm LVOT diam:     2.00 cm LV SV:         52 ml LV SV Index:   24.74 LVOT Area:     3.14 cm  LEFT ATRIUM              Index       RIGHT ATRIUM           Index LA diam:        5.00 cm  2.43 cm/m  RA Area:     20.60 cm LA Vol (A2C):   149.0 ml 72.36 ml/m RA Volume:   54.80 ml  26.61 ml/m LA Vol (A4C):   104.0 ml 50.51 ml/m LA Biplane Vol: 123.0 ml 59.73 ml/m   AORTA Ao Root diam: 3.30 cm TRICUSPID VALVE TR Peak grad:   45.8 mmHg TR Vmax:        339.00 cm/s  SHUNTS Systemic Diam: 2.00 cm  Kirk Ruths MD Electronically signed by Kirk Ruths MD Signature Date/Time: 10/18/2019/3:51:08 PM    Final     Microbiology Recent Results (from the past 240 hour(s))  SARS CORONAVIRUS 2 (TAT 6-24 HRS) Nasopharyngeal Nasopharyngeal Swab     Status: Abnormal   Collection Time: 10/17/19  7:20 AM   Specimen: Nasopharyngeal Swab  Result Value Ref Range Status   SARS Coronavirus 2 POSITIVE (A) NEGATIVE Final    Comment: RESULT CALLED TO, READ BACK BY AND VERIFIED WITH: Bronwen Betters RN 14:20 10/17/19  ([email or web address]) (NOTE) SARS-CoV-2 target nucleic acids are DETECTED. The SARS-CoV-2 RNA is generally detectable in upper and lower respiratory specimens during the acute phase of infection. Positive results are indicative of the presence of SARS-CoV-2 RNA. Clinical correlation with patient history and other diagnostic information is  necessary to determine patient infection status. Positive results do not rule out bacterial infection or co-infection with other viruses.  The expected result is Negative. Fact Sheet for Patients: SugarRoll.be Fact Sheet for Healthcare Providers: https://www.woods-mathews.com/ This test is not yet approved or cleared by the [place] FDA and  has been authorized for detection and/or diagnosis of SARS-CoV-2 by FDA under an Emergency Use Authorization (EUA). This EUA will remain  in effect (meaning this test can be used) for th e duration of the COVID-19 declaration under Section 564(b)(1) of the Act, 21 U.S.C. section 360bbb-3(b)(1), unless the authorization is terminated or revoked sooner. Performed at [facility], [place] [address], [place], [place] [address]   MRSA PCR Screening     Status: None   Collection Time: 10/18/19  1:32 PM   Specimen: Nasal Mucosa; Nasopharyngeal  Result Value Ref Range Status   MRSA by PCR NEGATIVE NEGATIVE Final    Comment:        The GeneXpert MRSA Assay (FDA approved for NASAL specimens only), is one component of a comprehensive MRSA colonization surveillance program. It is not intended to diagnose MRSA infection nor to guide or monitor treatment for MRSA infections. Performed at [facility], [place] [address] [place], [place] [address]     Lab Basic Metabolic Panel: Recent Labs  Lab 10/18/19 [phone] 10/18/19 1122 10/19/19 0237 10/19/19 1700 10/20/19 0257 10/21/19 0632 10/21/19 1630 11/06/19 0447 2019/11/06 0518 November 06, 2019 0519 2019-11-06 1105  NA 140  --  142   --  134* 138  137 130* 136 137 137 137  K 5.7*  --  5.3*  --  4.7 4.6  4.7 4.8 4.7 4.7 4.7 5.4*  CL 109   < > 111  --  106 104  104 105  --  104 104  --   CO2 14*   < > 12*  --  17* 17*  19* 19*  --  17* 16*  --   GLUCOSE 105*   < > 78  --  183* 138*  136* 133*  --  81 80  --   BUN 60*   < > 57*  --  45* 53*  52* 46*  --  43* 42*  --   CREATININE 4.17*   < > 3.87*  --  2.30* 1.95*  1.97* 1.59*  --  1.45* 1.42*  --   CALCIUM 8.7*   < > 7.9*  --  7.1* 7.1*  7.2* 7.2*  --  7.4* 7.4*  --   MG 2.2  --  2.2 2.3 2.2 2.5*  --   --   --   --   --   PHOS  --    < > 7.1* 5.0* 4.1 2.6  2.6 3.3  --   --  2.9  --    < > = values in this interval not displayed.   Liver Function Tests: Recent Labs  Lab 10/18/19 0736 10/19/19 0237 10/20/19 0257 10/21/19 0632 10/21/19 1630 11/06/2019 0518 November 06, 2019 0519  AST 76* 611* 1,146* 892*  --  564*  --   ALT 56* 433* 1,044* 1,063*  --  929*  --   ALKPHOS 88 85 83 72  --  63  --   BILITOT 2.7* 3.5* 4.5* 11.6*  --  19.6*  --   PROT 6.1* 5.1* 5.0* 5.1*  --  5.3*  --   ALBUMIN 3.2* 2.7* 2.4* 2.5*  2.5* 2.3* 2.1* 2.1*   No results for input(s): LIPASE, AMYLASE in the last 168 hours. No results for input(s): AMMONIA in the last 168  hours. CBC: Recent Labs  Lab 10/18/19 0735 10/19/19 0237 10/20/19 0257 10/20/19 0859 10/21/19 0632 10/21/19 1009 11/09/19 0447 2019-11-09 0518 Nov 09, 2019 1105  WBC 6.6 14.9* 4.2  --  3.3*  --   --  2.1*  --   NEUTROABS  --  13.1*  --   --   --   --   --   --   --   HGB 13.5 13.3 14.4  --  13.6 15.0 15.3 13.1 15.6  HCT 44.3 43.1 45.0  --  42.4 44.0 45.0 42.7 46.0  MCV 80.0 80.7 76.9*  --  76.3*  --   --  78.5*  --   PLT 199 163 124* 113* 90*  --   --  40*  --    Cardiac Enzymes: No results for input(s): CKTOTAL, CKMB, CKMBINDEX, TROPONINI in the last 168 hours. Sepsis Labs: Recent Labs  Lab 10/19/19 0237 10/20/19 0257 10/20/19 0859 10/21/19 0632 11-09-2019 0518 11-09-2019 1030  WBC 14.9* 4.2  --  3.3* 2.1*  --    LATICACIDVEN  --   --  3.3*  --   --  >11.0*    Procedures/Operations  ETT 12/10 >>  R Fem Aline 12/10 >>  R IJ HD 12/10 >>    Simonne Maffucci 11/09/19, 6:04 PM

## 2019-11-09 NOTE — Progress Notes (Signed)
ANTICOAGULATION CONSULT NOTE  Pharmacy Consult for Heparin  Indication: atrial fibrillation  Patient Measurements: Height: 5\' 11"  (180.3 cm) Weight: 187 lb 9.8 oz (85.1 kg) IBW/kg (Calculated) : 75.3 Heparin Dosing Weight: 87.5 kg  Vital Signs: Temp: 98.6 F (37 C) (12/14 0400) Temp Source: Axillary (12/14 0400) BP: 86/73 (12/14 0715) Pulse Rate: 94 (12/14 0447)  Labs: Recent Labs    10/20/19 0500 10/20/19 0859 10/20/19 1424 10/21/19 0631 10/21/19 3403 10/21/19 0632 10/21/19 1009 10/21/19 1630 10/21/19 1653 04-Nov-2019 0447 11/04/2019 0518 November 04, 2019 0519  HGB   < >  --   --   --  13.6   < > 15.0  --   --  15.3 13.1  --   HCT   < >  --   --   --  42.4  --  44.0  --   --  45.0 42.7  --   PLT  --  113*  --   --  90*  --   --   --   --   --  40*  --   APTT  --  >200*  --   --  169*  --   --   --  124*  --   --   --   LABPROT  --  27.0*  --   --   --   --   --   --   --   --   --   --   INR  --  2.5*  --   --   --   --   --   --   --   --   --   --   HEPARINUNFRC  --   --  0.61 0.30  --   --   --   --   --   --  0.21*  --   CREATININE  --   --   --   --  1.95*  1.97*  --   --  1.59*  --   --  1.45* 1.42*   < > = values in this interval not displayed.     Assessment: 46 yom that presented to the ED in A-fib w/ RVR. Patient's CHA2DS2-VAsc score is > 2.   Patient was transferred to ICU, Intubated, and placed on CRRT.  Heparin level decreased to 0.21, sub-therapeutic CBC:  Plt decreased to 40k this morning. RN reports no bleeding or complications noted.   Previous notes state the CRRT circuit was clotting despite systemic heparin. Currently there are no issues with the machine nor patient. LFTs: increase starting on 12/11, peaked at AST/ALT 1146/1044 on 12/12, now decreased to 564/929.  Tbili increased to 19.6.  Goal of Therapy:  Heparin level 0.3-0.7 units/ml Monitor platelets by anticoagulation protocol: Yes    Plan:  Continue Heparin at 1150 units/hr Patient also  has 300 units/hr fixed rate running through CRRT No changes at this time due to thrombocytopenia, pending discussion with PCCM - paged.   Gretta Arab PharmD, BCPS Clinical pharmacist phone 7am- 5pm: [phone] Nov 04, 2019 7:18 AM

## 2019-11-09 NOTE — Progress Notes (Signed)
[facility] PCCM  Ongoing worsening shock today complicated by severe metabolic acidosis Refractory to epinephrine, levophed, vasopressin, mechanical ventilation, CRRT, bicarbonate infusion and bicarbonate push.   Developed a cardiac arrest.  Family was made aware of the fact that he was unlikely to survive.   Standard ACLS response taken. On my arrival he was in PEA arrest. Given the patient's severe refractory shock, ARDS and multi-organ failure prior to the cardiac arrest I ordered CPR held as it was not providing medical benefit.  I was able to reach his brother Jeanell Sparrow and gave him the info. Time of death [place], [place] PCCM Pager: [phone] Cell: [phone] If no response, call [phone]

## 2019-11-09 NOTE — Progress Notes (Signed)
Interval history/subjective  He got more hypotensive with dobutamine, this was discontinued. Now requiring epinephrine at 20 mics per minute of epinephrine as well as norepinephrine and vasopressin Also requiring as needed bicarbonate pushes Now on D10 infusion Having coffee-ground blood-tinged output from NG tube Anticoagulation stopped Lactates greater than 11  Blood Pressure (Abnormal) 54/46   Pulse (Abnormal) 30   Temperature 99.3 F (37.4 C) (Axillary)   Respiration 16   Height $Remov'5\' 11"'pNAdmr$  (1.803 m)   Weight 85.1 kg   Oxygen Saturation (Abnormal) 62%   Body Mass Index 26.17 kg/m  CVP:  [12 mmHg] 12 mmHg  Intake/Output Summary (Last 24 hours) at Nov 09, 2019 1412 Last data filed at 11-09-2019 1400 Gross per 24 hour  Intake 4178.97 ml  Output 2479 ml  Net 1699.97 ml    Physical exam General: 59 year old black male currently sedated on fentanyl, and Precedex, Versed currently is being hung HEENT: Sclera slightly icteric pupils reactive mucous membranes moist orally intubated Pulm: Equal bilateral chest rise a little more compliant with ventilator than when comparing earlier today respiratory rate at 30 Card: Atrial fibrillation on telemetry with frequent ventricular beats Extremities/MS: Cool pulses weak Neuro: Localizes with offers  Lab data  Recent Labs  Lab 10/21/19 1630 November 09, 2019 0518 2019-11-09 0519 11-09-19 1105  NA 130* 137 137 137  K 4.8 4.7 4.7 5.4*  CL 105 104 104  --   CO2 19* 17* 16*  --   BUN 46* 43* 42*  --   CREATININE 1.59* 1.45* 1.42*  --   GLUCOSE 133* 81 80  --    Recent Labs  Lab 10/20/19 0257 10/20/19 0859 10/21/19 0632 11-09-2019 0447 Nov 09, 2019 0518 Nov 09, 2019 1105  HGB 14.4  --  13.6 15.3 13.1 15.6  HCT 45.0  --  42.4 45.0 42.7 46.0  WBC 4.2  --  3.3*  --  2.1*  --   PLT 124* 113* 90*  --  40*  --    ABG    Component Value Date/Time   PHART 7.273 (L) November 09, 2019 0447   PCO2ART 35.1 November 09, 2019 0447   PO2ART 63.0 (L) November 09, 2019  0447   HCO3 12.4 (L) Nov 09, 2019 1105   TCO2 14 (L) 11-09-2019 1105   ACIDBASEDEF 20.0 (H) 09-Nov-2019 1105   O2SAT 49.0 2019/11/09 1105    Impression Cardiogenic shock in setting of acute biventricular heart failure with multiple organ failure: Currently on 3 pressors, and bicarbonate drip  Acute hypoxic respiratory failure in setting of Covid pneumonia: No improvement on chest x-ray, not tolerating volume removal on CRRT Atrial fibrillation: Currently rate controlled Upper GI bleed: Anticoagulation stopped, placed on Protonix drip Acute on chronic renal failure: On CRRT Profound lactic acidosis: I am worried about it got ischemia at this point Hypoglycemia Thrombocytopenia,Had been changed to angiomax given 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 suspect this is more 2/2 shock  Discussion Clinically he is continued to decline over the course of the day he is now in refractory shock, I am concerned that the acidosis is playing a major contributing factor to his hemodynamics.  He is now on maximized therapy Plan.  Continue full ventilator support Continue to titrate vasoactive drips for mean arterial pressure greater than 65 Discontinue dobutamine Continue bicarbonate infusion in addition to CRRT Will discuss further with family again later this afternoon Continue D10 for hypoglycemia Continue Protonix infusion  32 minutes.    Erick Colace ACNP-BC [place] Pager # [phone] OR # [phone] if no  answer

## 2019-11-09 NOTE — Progress Notes (Signed)
Sent patient's valuables-- wallet with 73 dollars, debit card, drivers license, and cell phone with charger-- to security to be locked up until family is available to pick them up. Also placed patient's clothing including jacket, jeans, shoes, and sweater in patient belonging bag with patient to be picked with patient from [place].

## 2019-11-09 NOTE — Progress Notes (Signed)
Since turning patient at 2230 aline has dampened wave form and has been reading lower pressure and not correlating with cuff pressure.  Have zeroed several times and flushed several times without better waveform.

## 2019-11-09 NOTE — Procedures (Signed)
Central Venous Catheter Insertion Procedure Note Paul [name] [id number] 31-Dec-1960  Procedure: Insertion of Central Venous Catheter Indications: Assessment of intravascular volume, Drug and/or fluid administration and Frequent blood sampling  Procedure Details Consent: Risks of procedure as well as the alternatives and risks of each were explained to the (patient/caregiver).  Consent for procedure obtained. and Unable to obtain consent because of altered level of consciousness. Time Out: Verified patient identification, verified procedure, site/side was marked, verified correct patient position, special equipment/implants available, medications/allergies/relevent history reviewed, required imaging and test results available.  Performed  Maximum sterile technique was used including antiseptics, cap, gloves, gown, hand hygiene, mask and sheet. Skin prep: Chlorhexidine; local anesthetic administered A antimicrobial bonded/coated triple lumen catheter was placed in the left internal jugular vein using the Seldinger technique.  Evaluation Blood flow good Complications: No apparent complications Patient did tolerate procedure well. Chest X-ray ordered to verify placement.  CXR: pending.  Paul [name] 11-17-19, 2:25 PM  Paul [name] ACNP-BC [place] Pager # [phone] OR # [phone] if no answer

## 2019-11-09 NOTE — Progress Notes (Addendum)
PULMONARY / CRITICAL CARE MEDICINE   NAME:  Paul [name], MRN:  [id number], DOB:  10-25-1961, LOS: 6 ADMISSION DATE:  11/05/2019, CONSULTATION DATE: 10/18/2019 REFERRING MD: Teaching service, CHIEF COMPLAINT: Hypoxic respiratory failure  BRIEF HISTORY:    59 year old male smoker, biventricular failure, high output cardiomyopathy admitted 12/8 with increased SOB.  Found to be in [facility] and worsening hypoxia.  COVID-19 positive on admit. He continued to decline and was transferred to the ICU on 10/18/2019 and urgently intubated.  He has known biventricular failure has been treated with Lasix.  Treated with amiodarone drip and heparin drip for AF.  Cardiology consulted for elevated troponin. Nephrology consulted for AKI on CKD.   SIGNIFICANT PAST MEDICAL HISTORY   Biventricular failure COVID Tobacco abuse Atrial fibrillation Chronic kidney disease Degenerative joint disease with hip replacement Chronic pain  SIGNIFICANT EVENTS:  12/08 Admit  12/10 Tx to ICU, intubated 12/13 and 12/14 refractory shock. Adding bicarb gtt, and dobutamine  STUDIES:   12/10 ECHO >> LVEF 20%, severe RV hypokinesis   CULTURES:  12/09 COVID >> positive 12/10 Sputum >> not sent 12/10 BCx2 >> not sent  ANTIBIOTICS / COVID:  Remdesivir 12/9 >> 12/11 Decadron 12/9 >>   LINES/TUBES:  ETT 12/10 >>  R Fem Aline 12/10 >>  R IJ HD 12/10 >>   CONSULTANTS:  12/10 PCCM  SUBJECTIVE:   Agonal respiratory pattern on vent. Requiring high dose pressors.   CONSTITUTIONAL: Blood Pressure 99/83   Pulse 94   Temperature 99.3 F (37.4 C) (Axillary)   Respiration (Abnormal) 25   Height $Remov'5\' 11"'MdebuP$  (1.803 m)   Weight 85.1 kg   Oxygen Saturation 92%   Body Mass Index 26.17 kg/m   I/O last 3 completed shifts: In: 4344.2 [I.V.:2648; NG/GT:1662.5; IV Piggyback:33.7] Out: 2778 [Urine:500; Other:3591; Stool:140]     Vent Mode: PRVC FiO2 (%):  [40 %-60 %] 60 % Set Rate:  [24 bmp] 24 bmp Vt Set:  [600 mL] 600  mL PEEP:  [5 cmH20] 5 cmH20 Pressure Support:  [12 cmH20] 12 cmH20 Plateau Pressure:  [20 cmH20] 20 cmH20  PHYSICAL EXAM: General sedated on precedex and fent HENT NCAT no JVD. Orally intubated.  pulm diffuse rhochi Card AF w/ no MRG abd hypoactive gu anuric Neuro sedated   RESOLVED PROBLEM LIST    ASSESSMENT AND PLAN    Acute hypoxemic respiratory failure in setting of COVID-19 Did not tolerate remdesivir due to elevated LFT's  -Current ventilator settings: -Portable chest x-ray personally reviewed: HD catheter in satisfactory position as is the endotracheal tube.  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  - large RV component likely related to COVID infection Plan Continue to titrate norepinephrine for mean arterial goal greater than 65 Continue on shock dose vasopressin Checking svo2 Adding dobutamine and will try to titrate to scvo2 > 70% Bicarb gtt to get ph >7.2  Reactive Afib  Plan Continue telemetry monitoring Trying to avoid amiodarone given elevated LFTs Cont angiomax    Acute on chronic renal failure - on CRRT, -appreciate Nephrology input -Serum creatinine slightly  improved Plan Cont CRRT Serial chemistries   Anion gap metabolic acidosis, suspect secondary to ongoing shock state and AKI-->lactate > 11 Plan rx shock Added additional bicarb  Congestive hepatic failure - related to acute heart failure, cannot rule out superimosed COVID and/or remdesivir effect -Plan Continue to trend LFTs Holding hepatotoxic  agents No further remdesivir   Dropping platelets, elevated PTT  DIC panel completed 12/12, unclear clinical significance given ongoing HD and impact of COVID  Plan Trend CBC  Hyperglycemia/hypoglycemia  Plan Sliding scale insulin protocol   Daily Goals Checklist  Pain/Anxiety/Delirium protocol (if indicated): fentanyl and precedex infusions  VAP protocol (if indicated): bundle in place DVT prophylaxis: systemic IV heparin for AF Nutritional status and feeding goals: TF GI prophylaxis: Famotidine Glucose control: SSI Mobility/therapy needs: bedrest Dispo: ICU  My cct 46 minutes.   Erick Colace ACNP-BC [place] Pager # [phone] OR # [phone] if no answer

## 2019-11-09 NOTE — Progress Notes (Signed)
Titrated fentanyl drip back to 75 mcg/H after going down to 50 mcg/H because patient became agitated and tachypneic.

## 2019-11-09 NOTE — Progress Notes (Signed)
Dr. Lake Bells paged at this time for critical value of total bilirubin 19.6. Awaiting call back.

## 2019-11-09 NOTE — Progress Notes (Signed)
[place] for Bivalirudin Indication: atrial fibrillation, CRRT anticoagulation, r/o HIT  Patient Measurements: Height: 5\' 11"  (180.3 cm) Weight: 187 lb 9.8 oz (85.1 kg) IBW/kg (Calculated) : 75.3 Heparin Dosing Weight: 87.5 kg  Vital Signs: Temp: 99.3 F (37.4 C) (12/14 0800) Temp Source: Axillary (12/14 0800) BP: 107/84 (12/14 0930) Pulse Rate: 113 (12/14 0813)  Labs: Recent Labs    10/20/19 0500 10/20/19 0855 10/20/19 0859 10/20/19 1424 10/21/19 0631 10/21/19 2563 10/21/19 0632 10/21/19 1009 10/21/19 1630 10/21/19 1653 2019-11-12 0447 11-12-19 0518 2019-11-12 0519  HGB   < >  --   --   --   --  13.6   < > 15.0  --   --  15.3 13.1  --   HCT   < >  --   --   --   --  42.4  --  44.0  --   --  45.0 42.7  --   PLT  --   --  113*  --   --  90*  --   --   --   --   --  40*  --   APTT  --    < > >200*  --   --  169*  --   --   --  124*  --  132*  --   LABPROT  --   --  27.0*  --   --   --   --   --   --   --   --   --   --   INR  --   --  2.5*  --   --   --   --   --   --   --   --   --   --   HEPARINUNFRC  --   --   --  0.61 0.30  --   --   --   --   --   --  0.21*  --   CREATININE  --   --   --   --   --  1.95*  1.97*  --   --  1.59*  --   --  1.45* 1.42*   < > = values in this interval not displayed.     Assessment: 1 yom that presented to the ED in A-fib w/ RVR. Patient's CHA2DS2-VAsc score is > 2.  Patient was transferred to ICU, Intubated, and placed on CRRT.  Heparin level decreased to 0.21, sub-therapeutic CBC:  Plt decreased to 40k this morning (baseline 199 on 12/8) RN reports no bleeding or complications noted.   Previous notes state the CRRT circuit was clotting despite systemic heparin. Currently there are no issues with the machine nor patient. LFTs: increase starting on 12/11, peaked at AST/ALT 1146/1044 on 12/12, now decreased to 564/929.  Tbili increased to 19.6.  Goal of Therapy:  Heparin level 0.3-0.7  units/ml Monitor platelets by anticoagulation protocol: Yes   Plan:   D/C heparin  Planning to insert central line, start Bivalirudin 1 hour after procedure.  Bivalirudin 0.05 mg/kg/hr  APTT q2h until 2 therapeutic levels.  Gretta Arab PharmD, BCPS Clinical pharmacist phone 7am- 5pm: [phone] November 12, 2019 9:56 AM

## 2019-11-09 NOTE — Progress Notes (Signed)
eLink Physician-Brief Progress Note Patient Name: Paul [name] DOB: October 01, 1961 MRN: [id number]   Date of Service  10/25/19  HPI/Events of Note  Notified of hypotension on norepinephrine 50 and Vasopressin 0.03 Sedated on Fentanyl 75 and Precedex 1.2 no response to suctioning ABG 7.27/35/63 AC 24/500/O2 now at 60%/5 PEEP CVP 12 off CRRT  eICU Interventions   Increased vasopressin to 0.04  Discusses with bedside RN to try titrating down sedation as tolerated  Ordered a dose of albumin 25%     Intervention Category Major Interventions: Hypotension - evaluation and management  Judd Lien October 25, 2019, 4:52 AM

## 2019-11-09 NOTE — Progress Notes (Signed)
Dr. Genevive Bi cameraed into room and assessed patient. MD gave order to go up on vasopressin drip and ordered. Albumin. RN gave MD blood gas results. MD also asked RN to go down on fentanyl drip as tolerated.

## 2019-11-09 NOTE — Progress Notes (Addendum)
Called Paul [name] and spoke with Leveda Anna, RN and asked her to let MD know that patient's blood pressure is going down and currently maxed out on levophed at 40 mcg/min and on vasopressin drip. MAP in 50's per aline and map 65 per cuff pressure. Aline has not been reading well. No longer pulling fluid on CRRT either.

## 2019-11-09 DEATH — deceased
